# Patient Record
Sex: Female | Born: 1945 | Race: White | Hispanic: No | Marital: Married | Smoking: Former smoker
Health system: Southern US, Community
[De-identification: ages and names within clinical notes are randomized; demographics above are authoritative.]

## PROBLEM LIST (undated history)

## (undated) DIAGNOSIS — G4733 Obstructive sleep apnea (adult) (pediatric): Secondary | ICD-10-CM

## (undated) DIAGNOSIS — M545 Low back pain, unspecified: Secondary | ICD-10-CM

## (undated) DIAGNOSIS — N289 Disorder of kidney and ureter, unspecified: Secondary | ICD-10-CM

## (undated) DIAGNOSIS — I10 Essential (primary) hypertension: Secondary | ICD-10-CM

## (undated) DIAGNOSIS — M797 Fibromyalgia: Secondary | ICD-10-CM

## (undated) DIAGNOSIS — F419 Anxiety disorder, unspecified: Secondary | ICD-10-CM

## (undated) DIAGNOSIS — R0602 Shortness of breath: Secondary | ICD-10-CM

## (undated) DIAGNOSIS — D126 Benign neoplasm of colon, unspecified: Secondary | ICD-10-CM

## (undated) DIAGNOSIS — E785 Hyperlipidemia, unspecified: Secondary | ICD-10-CM

## (undated) DIAGNOSIS — L509 Urticaria, unspecified: Secondary | ICD-10-CM

## (undated) DIAGNOSIS — F32A Depression, unspecified: Secondary | ICD-10-CM

## (undated) DIAGNOSIS — M1990 Unspecified osteoarthritis, unspecified site: Secondary | ICD-10-CM

## (undated) DIAGNOSIS — H269 Unspecified cataract: Secondary | ICD-10-CM

## (undated) DIAGNOSIS — F329 Major depressive disorder, single episode, unspecified: Secondary | ICD-10-CM

## (undated) DIAGNOSIS — Z87898 Personal history of other specified conditions: Secondary | ICD-10-CM

## (undated) DIAGNOSIS — K648 Other hemorrhoids: Secondary | ICD-10-CM

## (undated) DIAGNOSIS — E119 Type 2 diabetes mellitus without complications: Secondary | ICD-10-CM

## (undated) HISTORY — DX: Obstructive sleep apnea (adult) (pediatric): G47.33

## (undated) HISTORY — DX: Unspecified cataract: H26.9

## (undated) HISTORY — DX: Shortness of breath: R06.02

## (undated) HISTORY — DX: Urticaria, unspecified: L50.9

## (undated) HISTORY — DX: Essential (primary) hypertension: I10

## (undated) HISTORY — DX: Low back pain: M54.5

## (undated) HISTORY — DX: Unspecified osteoarthritis, unspecified site: M19.90

## (undated) HISTORY — PX: TOTAL ABDOMINAL HYSTERECTOMY: SHX209

## (undated) HISTORY — DX: Personal history of other specified conditions: Z87.898

## (undated) HISTORY — DX: Type 2 diabetes mellitus without complications: E11.9

## (undated) HISTORY — PX: LASIK: SHX215

## (undated) HISTORY — DX: Major depressive disorder, single episode, unspecified: F32.9

## (undated) HISTORY — DX: Other hemorrhoids: K64.8

## (undated) HISTORY — DX: Benign neoplasm of colon, unspecified: D12.6

## (undated) HISTORY — DX: Depression, unspecified: F32.A

## (undated) HISTORY — DX: Disorder of kidney and ureter, unspecified: N28.9

## (undated) HISTORY — DX: Hyperlipidemia, unspecified: E78.5

## (undated) HISTORY — DX: Anxiety disorder, unspecified: F41.9

## (undated) HISTORY — PX: EYE SURGERY: SHX253

## (undated) HISTORY — DX: Low back pain, unspecified: M54.50

## (undated) HISTORY — PX: OTHER SURGICAL HISTORY: SHX169

---

## 1997-07-06 ENCOUNTER — Other Ambulatory Visit: Admission: RE | Admit: 1997-07-06 | Discharge: 1997-07-06 | Payer: Self-pay | Admitting: Obstetrics and Gynecology

## 1998-07-30 ENCOUNTER — Other Ambulatory Visit: Admission: RE | Admit: 1998-07-30 | Discharge: 1998-07-30 | Payer: Self-pay | Admitting: Obstetrics and Gynecology

## 1998-07-31 ENCOUNTER — Encounter (INDEPENDENT_AMBULATORY_CARE_PROVIDER_SITE_OTHER): Payer: Self-pay

## 1998-07-31 ENCOUNTER — Other Ambulatory Visit: Admission: RE | Admit: 1998-07-31 | Discharge: 1998-07-31 | Payer: Self-pay | Admitting: Obstetrics and Gynecology

## 1998-09-19 ENCOUNTER — Inpatient Hospital Stay (HOSPITAL_COMMUNITY): Admission: RE | Admit: 1998-09-19 | Discharge: 1998-09-26 | Payer: Self-pay | Admitting: Obstetrics and Gynecology

## 1998-09-19 ENCOUNTER — Encounter (INDEPENDENT_AMBULATORY_CARE_PROVIDER_SITE_OTHER): Payer: Self-pay | Admitting: Specialist

## 1998-09-22 ENCOUNTER — Encounter: Payer: Self-pay | Admitting: Obstetrics and Gynecology

## 1998-09-23 ENCOUNTER — Encounter: Payer: Self-pay | Admitting: Obstetrics and Gynecology

## 1998-09-24 ENCOUNTER — Encounter: Payer: Self-pay | Admitting: Obstetrics and Gynecology

## 1999-07-30 ENCOUNTER — Other Ambulatory Visit: Admission: RE | Admit: 1999-07-30 | Discharge: 1999-07-30 | Payer: Self-pay | Admitting: Obstetrics and Gynecology

## 1999-08-03 ENCOUNTER — Encounter: Payer: Self-pay | Admitting: *Deleted

## 1999-08-03 ENCOUNTER — Emergency Department (HOSPITAL_COMMUNITY): Admission: EM | Admit: 1999-08-03 | Discharge: 1999-08-03 | Payer: Self-pay | Admitting: *Deleted

## 2000-08-16 ENCOUNTER — Ambulatory Visit (HOSPITAL_COMMUNITY): Admission: RE | Admit: 2000-08-16 | Discharge: 2000-08-16 | Payer: Self-pay | Admitting: Obstetrics and Gynecology

## 2000-08-16 ENCOUNTER — Other Ambulatory Visit: Admission: RE | Admit: 2000-08-16 | Discharge: 2000-08-16 | Payer: Self-pay | Admitting: Obstetrics and Gynecology

## 2001-03-22 ENCOUNTER — Ambulatory Visit (HOSPITAL_BASED_OUTPATIENT_CLINIC_OR_DEPARTMENT_OTHER): Admission: RE | Admit: 2001-03-22 | Discharge: 2001-03-22 | Payer: Self-pay | Admitting: Pulmonary Disease

## 2001-06-02 ENCOUNTER — Ambulatory Visit (HOSPITAL_BASED_OUTPATIENT_CLINIC_OR_DEPARTMENT_OTHER): Admission: RE | Admit: 2001-06-02 | Discharge: 2001-06-02 | Payer: Self-pay | Admitting: Critical Care Medicine

## 2003-03-21 ENCOUNTER — Other Ambulatory Visit: Admission: RE | Admit: 2003-03-21 | Discharge: 2003-03-21 | Payer: Self-pay | Admitting: Obstetrics and Gynecology

## 2003-11-26 ENCOUNTER — Ambulatory Visit: Payer: Self-pay | Admitting: Internal Medicine

## 2003-12-28 ENCOUNTER — Ambulatory Visit: Payer: Self-pay | Admitting: Internal Medicine

## 2004-01-08 ENCOUNTER — Ambulatory Visit (HOSPITAL_COMMUNITY): Admission: RE | Admit: 2004-01-08 | Discharge: 2004-01-08 | Payer: Self-pay | Admitting: Internal Medicine

## 2004-03-12 ENCOUNTER — Ambulatory Visit: Payer: Self-pay | Admitting: Internal Medicine

## 2004-03-19 ENCOUNTER — Ambulatory Visit: Payer: Self-pay | Admitting: Internal Medicine

## 2004-04-04 ENCOUNTER — Ambulatory Visit: Payer: Self-pay | Admitting: Internal Medicine

## 2004-05-01 ENCOUNTER — Ambulatory Visit: Payer: Self-pay | Admitting: Critical Care Medicine

## 2004-07-09 ENCOUNTER — Ambulatory Visit: Payer: Self-pay | Admitting: Internal Medicine

## 2004-08-12 ENCOUNTER — Ambulatory Visit: Payer: Self-pay | Admitting: Internal Medicine

## 2004-08-29 ENCOUNTER — Ambulatory Visit (HOSPITAL_COMMUNITY): Admission: RE | Admit: 2004-08-29 | Discharge: 2004-08-29 | Payer: Self-pay | Admitting: Internal Medicine

## 2005-03-25 ENCOUNTER — Ambulatory Visit: Payer: Self-pay | Admitting: Internal Medicine

## 2005-07-20 ENCOUNTER — Ambulatory Visit: Payer: Self-pay | Admitting: Internal Medicine

## 2005-07-27 ENCOUNTER — Ambulatory Visit: Payer: Self-pay | Admitting: Internal Medicine

## 2005-08-05 ENCOUNTER — Ambulatory Visit: Payer: Self-pay | Admitting: Internal Medicine

## 2005-10-19 ENCOUNTER — Ambulatory Visit: Payer: Self-pay | Admitting: Internal Medicine

## 2005-10-27 ENCOUNTER — Ambulatory Visit: Payer: Self-pay | Admitting: Internal Medicine

## 2006-03-22 ENCOUNTER — Ambulatory Visit: Payer: Self-pay | Admitting: Internal Medicine

## 2006-04-29 ENCOUNTER — Ambulatory Visit: Payer: Self-pay | Admitting: Internal Medicine

## 2006-04-29 LAB — CONVERTED CEMR LAB
CO2: 26 meq/L (ref 19–32)
Calcium: 9 mg/dL (ref 8.4–10.5)
Chloride: 107 meq/L (ref 96–112)
GFR calc non Af Amer: 60 mL/min
Glucose, Bld: 124 mg/dL — ABNORMAL HIGH (ref 70–99)
Sodium: 142 meq/L (ref 135–145)

## 2006-05-04 ENCOUNTER — Ambulatory Visit: Payer: Self-pay | Admitting: Internal Medicine

## 2006-08-05 ENCOUNTER — Ambulatory Visit: Payer: Self-pay | Admitting: Internal Medicine

## 2006-08-05 LAB — CONVERTED CEMR LAB
Albumin: 3.7 g/dL (ref 3.5–5.2)
Alkaline Phosphatase: 54 units/L (ref 39–117)
BUN: 19 mg/dL (ref 6–23)
Creatinine, Ser: 1.1 mg/dL (ref 0.4–1.2)
GFR calc Af Amer: 65 mL/min
GFR calc non Af Amer: 54 mL/min
HDL: 30.8 mg/dL — ABNORMAL LOW (ref >39.0)
Hgb A1c MFr Bld: 6.2 % — ABNORMAL HIGH (ref 4.6–6.0)
LDL Cholesterol: 90 mg/dL (ref 0–99)
Potassium: 3.8 meq/L (ref 3.5–5.1)
Sed Rate: 13 mm/hr (ref 0–25)
Sodium: 140 meq/L (ref 135–145)
TSH: 1.27 microintl units/mL (ref 0.35–5.50)
Total Bilirubin: 0.7 mg/dL (ref 0.3–1.2)
Total CHOL/HDL Ratio: 4.9
Triglycerides: 155 mg/dL — ABNORMAL HIGH (ref 0–149)
VLDL: 31 mg/dL (ref 0–40)
Vit D, 1,25-Dihydroxy: 29 (ref 20–57)

## 2006-08-07 ENCOUNTER — Encounter: Payer: Self-pay | Admitting: Internal Medicine

## 2006-08-07 DIAGNOSIS — E669 Obesity, unspecified: Secondary | ICD-10-CM | POA: Insufficient documentation

## 2006-08-07 DIAGNOSIS — F32A Depression, unspecified: Secondary | ICD-10-CM | POA: Insufficient documentation

## 2006-08-07 DIAGNOSIS — I10 Essential (primary) hypertension: Secondary | ICD-10-CM | POA: Insufficient documentation

## 2006-08-07 DIAGNOSIS — G4733 Obstructive sleep apnea (adult) (pediatric): Secondary | ICD-10-CM | POA: Insufficient documentation

## 2006-08-07 DIAGNOSIS — M1990 Unspecified osteoarthritis, unspecified site: Secondary | ICD-10-CM | POA: Insufficient documentation

## 2006-08-07 DIAGNOSIS — F329 Major depressive disorder, single episode, unspecified: Secondary | ICD-10-CM | POA: Insufficient documentation

## 2006-08-07 DIAGNOSIS — F419 Anxiety disorder, unspecified: Secondary | ICD-10-CM | POA: Insufficient documentation

## 2006-08-12 ENCOUNTER — Ambulatory Visit: Payer: Self-pay | Admitting: Internal Medicine

## 2006-11-15 ENCOUNTER — Encounter: Payer: Self-pay | Admitting: Internal Medicine

## 2006-11-17 ENCOUNTER — Telehealth: Payer: Self-pay | Admitting: Internal Medicine

## 2006-12-22 ENCOUNTER — Encounter: Payer: Self-pay | Admitting: Internal Medicine

## 2006-12-24 ENCOUNTER — Ambulatory Visit: Payer: Self-pay | Admitting: Internal Medicine

## 2006-12-26 LAB — CONVERTED CEMR LAB
ALT: 38 units/L — ABNORMAL HIGH (ref 0–35)
AST: 25 units/L (ref 0–37)
Albumin: 3.5 g/dL (ref 3.5–5.2)
Bilirubin, Direct: 0.1 mg/dL (ref 0.0–0.3)
Calcium: 9.3 mg/dL (ref 8.4–10.5)
Chloride: 102 meq/L (ref 96–112)
Cholesterol: 150 mg/dL (ref 0–200)
Creatinine, Ser: 1.1 mg/dL (ref 0.4–1.2)
GFR calc non Af Amer: 54 mL/min
Glucose, Bld: 115 mg/dL — ABNORMAL HIGH (ref 70–99)
HDL: 40.4 mg/dL (ref >39.0)
LDL Cholesterol: 88 mg/dL (ref 0–99)
Sodium: 140 meq/L (ref 135–145)
Total Bilirubin: 0.8 mg/dL (ref 0.3–1.2)

## 2006-12-27 ENCOUNTER — Ambulatory Visit: Payer: Self-pay | Admitting: Internal Medicine

## 2006-12-27 DIAGNOSIS — M545 Low back pain, unspecified: Secondary | ICD-10-CM | POA: Insufficient documentation

## 2007-02-16 ENCOUNTER — Ambulatory Visit: Payer: Self-pay | Admitting: Internal Medicine

## 2007-02-16 DIAGNOSIS — M810 Age-related osteoporosis without current pathological fracture: Secondary | ICD-10-CM | POA: Insufficient documentation

## 2007-03-10 ENCOUNTER — Telehealth: Payer: Self-pay | Admitting: Internal Medicine

## 2007-04-12 ENCOUNTER — Ambulatory Visit: Payer: Self-pay | Admitting: Internal Medicine

## 2007-04-13 LAB — CONVERTED CEMR LAB
BUN: 21 mg/dL
CO2: 30 meq/L
Calcium: 9 mg/dL
Chloride: 104 meq/L
Cholesterol: 139 mg/dL
Creatinine, Ser: 1 mg/dL
GFR calc Af Amer: 72 mL/min
GFR calc non Af Amer: 60 mL/min
Glucose, Bld: 110 mg/dL — ABNORMAL HIGH
HDL: 35.3 mg/dL — ABNORMAL LOW
Hgb A1c MFr Bld: 6 %
LDL Cholesterol: 85 mg/dL
Potassium: 3.6 meq/L
Sodium: 139 meq/L
Total CHOL/HDL Ratio: 3.9
Triglycerides: 96 mg/dL
VLDL: 19 mg/dL

## 2007-04-19 ENCOUNTER — Ambulatory Visit: Payer: Self-pay | Admitting: Internal Medicine

## 2007-07-21 ENCOUNTER — Ambulatory Visit: Payer: Self-pay | Admitting: Internal Medicine

## 2007-07-21 DIAGNOSIS — M5412 Radiculopathy, cervical region: Secondary | ICD-10-CM | POA: Insufficient documentation

## 2007-07-21 DIAGNOSIS — R51 Headache: Secondary | ICD-10-CM

## 2007-07-21 DIAGNOSIS — R519 Headache, unspecified: Secondary | ICD-10-CM | POA: Insufficient documentation

## 2007-07-27 ENCOUNTER — Telehealth: Payer: Self-pay | Admitting: Internal Medicine

## 2007-07-29 ENCOUNTER — Ambulatory Visit: Payer: Self-pay | Admitting: Internal Medicine

## 2007-09-30 ENCOUNTER — Ambulatory Visit: Payer: Self-pay | Admitting: Internal Medicine

## 2007-10-03 LAB — CONVERTED CEMR LAB
ALT: 44 units/L — ABNORMAL HIGH (ref 0–35)
Basophils Absolute: 0.1 10*3/uL (ref 0.0–0.1)
Basophils Relative: 1.5 % (ref 0.0–3.0)
CO2: 30 meq/L (ref 19–32)
Calcium: 9.1 mg/dL (ref 8.4–10.5)
Cholesterol: 227 mg/dL (ref 0–200)
Creatinine, Ser: 1 mg/dL (ref 0.4–1.2)
Direct LDL: 147.1 mg/dL
Eosinophils Absolute: 0.2 10*3/uL (ref 0.0–0.7)
HCT: 39.4 % (ref 36.0–46.0)
Hemoglobin: 13.6 g/dL (ref 12.0–15.0)
Hgb A1c MFr Bld: 6 % (ref 4.6–6.0)
Ketones, ur: NEGATIVE mg/dL
Lymphocytes Relative: 35.6 % (ref 12.0–46.0)
MCHC: 34.5 g/dL (ref 30.0–36.0)
MCV: 90.6 fL (ref 78.0–100.0)
Monocytes Absolute: 0.6 10*3/uL (ref 0.1–1.0)
Neutro Abs: 2.6 10*3/uL (ref 1.4–7.7)
RBC: 4.35 M/uL (ref 3.87–5.11)
Specific Gravity, Urine: 1.015 (ref 1.000–1.03)
Total Bilirubin: 0.9 mg/dL (ref 0.3–1.2)
Total Protein, Urine: NEGATIVE mg/dL
Total Protein: 7.1 g/dL (ref 6.0–8.3)
Triglycerides: 150 mg/dL — ABNORMAL HIGH (ref 0–149)
Urine Glucose: NEGATIVE mg/dL
pH: 6.5 (ref 5.0–8.0)

## 2007-10-07 ENCOUNTER — Ambulatory Visit: Payer: Self-pay | Admitting: Internal Medicine

## 2007-12-16 ENCOUNTER — Telehealth: Payer: Self-pay | Admitting: Internal Medicine

## 2008-03-12 ENCOUNTER — Ambulatory Visit: Payer: Self-pay | Admitting: Internal Medicine

## 2008-03-13 LAB — CONVERTED CEMR LAB
CO2: 27 meq/L (ref 19–32)
Calcium: 9.3 mg/dL (ref 8.4–10.5)
Chloride: 106 meq/L (ref 96–112)
Direct LDL: 161 mg/dL
GFR calc non Af Amer: 53 mL/min
Hgb A1c MFr Bld: 6.2 % — ABNORMAL HIGH (ref 4.6–6.0)
Mucus, UA: NEGATIVE
Nitrite: NEGATIVE
Sodium: 141 meq/L (ref 135–145)
Specific Gravity, Urine: >=1.03 (ref 1.000–1.035)
pH: 5.5 (ref 5.0–8.0)

## 2008-03-19 ENCOUNTER — Ambulatory Visit: Payer: Self-pay | Admitting: Internal Medicine

## 2008-04-30 ENCOUNTER — Encounter: Payer: Self-pay | Admitting: Internal Medicine

## 2008-06-25 ENCOUNTER — Telehealth (INDEPENDENT_AMBULATORY_CARE_PROVIDER_SITE_OTHER): Payer: Self-pay | Admitting: *Deleted

## 2008-07-09 ENCOUNTER — Ambulatory Visit: Payer: Self-pay | Admitting: Internal Medicine

## 2008-07-09 LAB — CONVERTED CEMR LAB
ALT: 48 units/L — ABNORMAL HIGH (ref 0–35)
Bilirubin, Direct: 0.1 mg/dL (ref 0.0–0.3)
Chloride: 107 meq/L (ref 96–112)
Creatinine, Ser: 1.1 mg/dL (ref 0.4–1.2)
GFR calc non Af Amer: 53.31 mL/min (ref >60)
Hgb A1c MFr Bld: 6 % (ref 4.6–6.5)
Potassium: 3.9 meq/L (ref 3.5–5.1)
Total Bilirubin: 0.8 mg/dL (ref 0.3–1.2)
Total CHOL/HDL Ratio: 5
Triglycerides: 106 mg/dL (ref 0.0–149.0)
VLDL: 21.2 mg/dL (ref 0.0–40.0)

## 2008-07-17 ENCOUNTER — Ambulatory Visit: Payer: Self-pay | Admitting: Internal Medicine

## 2008-07-17 DIAGNOSIS — M25559 Pain in unspecified hip: Secondary | ICD-10-CM | POA: Insufficient documentation

## 2008-08-23 ENCOUNTER — Telehealth (INDEPENDENT_AMBULATORY_CARE_PROVIDER_SITE_OTHER): Payer: Self-pay | Admitting: *Deleted

## 2008-08-28 ENCOUNTER — Encounter: Payer: Self-pay | Admitting: Internal Medicine

## 2008-09-07 ENCOUNTER — Telehealth: Payer: Self-pay | Admitting: Internal Medicine

## 2008-09-12 HISTORY — PX: TOTAL HIP ARTHROPLASTY: SHX124

## 2008-09-19 ENCOUNTER — Inpatient Hospital Stay (HOSPITAL_COMMUNITY): Admission: RE | Admit: 2008-09-19 | Discharge: 2008-09-22 | Payer: Self-pay | Admitting: Orthopedic Surgery

## 2008-09-26 ENCOUNTER — Encounter: Payer: Self-pay | Admitting: Internal Medicine

## 2008-09-27 ENCOUNTER — Telehealth: Payer: Self-pay | Admitting: Internal Medicine

## 2008-09-27 ENCOUNTER — Ambulatory Visit (HOSPITAL_COMMUNITY): Admission: RE | Admit: 2008-09-27 | Discharge: 2008-09-27 | Payer: Self-pay | Admitting: Internal Medicine

## 2008-09-27 ENCOUNTER — Ambulatory Visit: Payer: Self-pay | Admitting: Internal Medicine

## 2008-09-27 DIAGNOSIS — R339 Retention of urine, unspecified: Secondary | ICD-10-CM | POA: Insufficient documentation

## 2008-10-01 ENCOUNTER — Ambulatory Visit: Payer: Self-pay | Admitting: Internal Medicine

## 2008-10-03 ENCOUNTER — Encounter: Payer: Self-pay | Admitting: Internal Medicine

## 2008-10-04 ENCOUNTER — Ambulatory Visit: Payer: Self-pay | Admitting: Internal Medicine

## 2008-10-04 ENCOUNTER — Telehealth: Payer: Self-pay | Admitting: Internal Medicine

## 2008-10-04 DIAGNOSIS — J309 Allergic rhinitis, unspecified: Secondary | ICD-10-CM | POA: Insufficient documentation

## 2008-10-04 DIAGNOSIS — L509 Urticaria, unspecified: Secondary | ICD-10-CM | POA: Insufficient documentation

## 2008-10-08 ENCOUNTER — Ambulatory Visit: Payer: Self-pay | Admitting: Internal Medicine

## 2008-10-08 LAB — CONVERTED CEMR LAB
ALT: 24 units/L (ref 0–35)
AST: 19 units/L (ref 0–37)
Alkaline Phosphatase: 77 units/L (ref 39–117)
BUN: 18 mg/dL (ref 6–23)
Bilirubin, Direct: 0 mg/dL (ref 0.0–0.3)
Calcium: 9.1 mg/dL (ref 8.4–10.5)
Cholesterol: 195 mg/dL (ref 0–200)
Creatinine, Ser: 1 mg/dL (ref 0.4–1.2)
GFR calc non Af Amer: 59.47 mL/min (ref >60)
Potassium: 3.8 meq/L (ref 3.5–5.1)
Total Bilirubin: 0.7 mg/dL (ref 0.3–1.2)
Total Protein: 7.2 g/dL (ref 6.0–8.3)

## 2008-10-15 ENCOUNTER — Telehealth: Payer: Self-pay | Admitting: Internal Medicine

## 2008-10-16 ENCOUNTER — Ambulatory Visit: Payer: Self-pay | Admitting: Internal Medicine

## 2008-10-18 ENCOUNTER — Telehealth: Payer: Self-pay | Admitting: Internal Medicine

## 2009-02-26 ENCOUNTER — Telehealth: Payer: Self-pay | Admitting: Internal Medicine

## 2009-03-28 ENCOUNTER — Ambulatory Visit: Payer: Self-pay | Admitting: Internal Medicine

## 2009-03-28 LAB — CONVERTED CEMR LAB
ALT: 32 units/L (ref 0–35)
Alkaline Phosphatase: 62 units/L (ref 39–117)
BUN: 14 mg/dL (ref 6–23)
Bilirubin Urine: NEGATIVE
Bilirubin, Direct: 0.1 mg/dL (ref 0.0–0.3)
CO2: 30 meq/L (ref 19–32)
Calcium: 9.1 mg/dL (ref 8.4–10.5)
Direct LDL: 158.4 mg/dL
GFR calc non Af Amer: 67.05 mL/min (ref >60)
Glucose, Bld: 90 mg/dL (ref 70–99)
Nitrite: NEGATIVE
Total Bilirubin: 0.6 mg/dL (ref 0.3–1.2)
Total Protein, Urine: NEGATIVE mg/dL
Total Protein: 7.2 g/dL (ref 6.0–8.3)
Urine Glucose: NEGATIVE mg/dL
pH: 6.5 (ref 5.0–8.0)

## 2009-04-04 ENCOUNTER — Ambulatory Visit: Payer: Self-pay | Admitting: Internal Medicine

## 2009-04-04 DIAGNOSIS — Z87891 Personal history of nicotine dependence: Secondary | ICD-10-CM | POA: Insufficient documentation

## 2009-05-07 ENCOUNTER — Encounter: Payer: Self-pay | Admitting: Internal Medicine

## 2009-05-27 ENCOUNTER — Encounter: Payer: Self-pay | Admitting: Internal Medicine

## 2009-08-06 ENCOUNTER — Telehealth: Payer: Self-pay | Admitting: Internal Medicine

## 2009-09-02 ENCOUNTER — Telehealth (INDEPENDENT_AMBULATORY_CARE_PROVIDER_SITE_OTHER): Payer: Self-pay | Admitting: *Deleted

## 2009-09-03 ENCOUNTER — Ambulatory Visit: Payer: Self-pay | Admitting: Internal Medicine

## 2009-09-03 LAB — CONVERTED CEMR LAB
BUN: 14 mg/dL (ref 6–23)
Chloride: 105 meq/L (ref 96–112)
Creatinine, Ser: 0.8 mg/dL (ref 0.4–1.2)
GFR calc non Af Amer: 75.62 mL/min (ref >60)
Hgb A1c MFr Bld: 6.1 % (ref 4.6–6.5)

## 2009-09-06 ENCOUNTER — Ambulatory Visit: Payer: Self-pay | Admitting: Internal Medicine

## 2009-10-09 ENCOUNTER — Encounter: Payer: Self-pay | Admitting: Internal Medicine

## 2009-11-25 ENCOUNTER — Ambulatory Visit: Payer: Self-pay | Admitting: Internal Medicine

## 2009-11-25 DIAGNOSIS — K1120 Sialoadenitis, unspecified: Secondary | ICD-10-CM | POA: Insufficient documentation

## 2009-12-25 ENCOUNTER — Encounter: Payer: Self-pay | Admitting: Internal Medicine

## 2010-01-16 ENCOUNTER — Encounter: Payer: Self-pay | Admitting: Internal Medicine

## 2010-01-21 ENCOUNTER — Telehealth: Payer: Self-pay | Admitting: Internal Medicine

## 2010-01-24 ENCOUNTER — Encounter (INDEPENDENT_AMBULATORY_CARE_PROVIDER_SITE_OTHER): Payer: Self-pay | Admitting: *Deleted

## 2010-02-02 ENCOUNTER — Encounter: Payer: Self-pay | Admitting: Internal Medicine

## 2010-02-13 NOTE — Miscellaneous (Signed)
Summary: Flu  Clinical Lists Changes  Observations: Added new observation of FLU VAX: Historical (10/08/2009 11:44)      Immunization History:  Influenza Immunization History:    Influenza:  historical (10/08/2009)

## 2010-02-13 NOTE — Progress Notes (Signed)
Summary: LETTER?   Phone Note Call from Patient Call back at Home Phone [phone] Call back at [phone]   Summary of Call: Pt wants to know if Dr Posey Rea recieved letter she mailed to him?  Initial call taken by: Lamar Sprinkles, CMA,  January 21, 2010 12:55 PM  Follow-up for Phone Call        Patient wants to know when MD will write her a letter. Follow-up by: Daphane Shepherd,  January 21, 2010 4:34 PM  Additional Follow-up for Phone Call Additional follow up Details #1::        MD gave me info today to read over and generate letter.  Additional Follow-up by: Lamar Sprinkles, CMA,  January 21, 2010 5:51 PM    Additional Follow-up for Phone Call Additional follow up Details #2::    Letter pending signature. Pt aware we are working on letter.  Follow-up by: Lamar Sprinkles, CMA,  January 24, 2010 1:41 PM  Additional Follow-up for Phone Call Additional follow up Details #3:: Details for Additional Follow-up Action Taken: Completed letter, mailed to pt, Pt informed  Additional Follow-up by: Lamar Sprinkles, CMA,  January 27, 2010 6:15 PM

## 2010-02-13 NOTE — Assessment & Plan Note (Signed)
Summary: right sore throat pain/offered sda w/another md/refused/cd   Vital Signs:  Patient profile:   65 year old female Height:      63 inches Weight:      223 pounds BMI:     39.65 Temp:     98.4 degrees F oral Pulse rate:   80 / minute Pulse rhythm:   regular Resp:     16 per minute BP sitting:   110 / 82  (left arm) Cuff size:   large  Vitals Entered By: Lanier Prude, Beverly Gust) (November 25, 2009 3:31 PM) CC: trouble swallowing "feels like something is in throat"  X  4wks Is Patient Diabetic? Yes   Primary Care Azaiah Mello:  Georgina Quint [name] MD  CC:  trouble swallowing "feels like something is in throat"  X  4wks.  History of Present Illness: C/o ST on R  x 1 wk  Current Medications (verified): 1)  Lasix 40 Mg  Tabs (Furosemide) .[name].. 1 Every Morning 2)  Enalapril Maleate 10 Mg  Tabs (Enalapril Maleate) .... Take 1 Tablet By Mouth Once A Day 3)  Citalopram Hydrobromide 40 Mg  Tabs (Citalopram Hydrobromide) .... Once Daily 4)  Vitamin D3 1000 Unit  Tabs (Cholecalciferol) .[name].. 1 Qd 5)  Metformin Hcl 500 Mg Tabs (Metformin Hcl) .... Two Times A Day 6)  Fish Oil   Oil (Fish Oil) .... Two Times A Day 7)  Flexeril 10 Mg Tabs (Cyclobenzaprine Hcl) .[name].. 1 By Mouth Three Times A Day Prn 8)  Tramadol Hcl 50 Mg  Tabs (Tramadol Hcl) .[name].. 1-2 By Mouth Two Times A Day As Needed Pain 9)  Hydrocodone-Acetaminophen 10-325 Mg Tabs (Hydrocodone-Acetaminophen) .... 1/2 or 1 By Mouth Up To 4 Time Per Day As Needed For Pain 10)  Flonase 50 Mcg/act Susp (Fluticasone Propionate) .[name].. 1 Spray Each Nostril Each Am 11)  Cpap Mask .... As Dirr 12)  Lotrisone 1-0.05 % Crea (Clotrimazole-Betamethasone) .... Use Bid  Allergies (verified): 1)  ! Celebrex 2)  ! Naprosyn 3)  ! * Robaxin 4)  Lovastatin (Lovastatin)  Past History:  Past Medical History: Last updated: 10/16/2008 Anxiety Depression Hypertension Osteoarthritis Diabetes mellitus, type II FMS Low back  pain OSA Osteoporosis Hyperlipidemia GI Dr Andria Meuse [facility]  Review of Systems  The patient denies fever and abdominal pain.    Physical Exam  General:  alert, well-developed, and cooperative to examination, overweight-appearing.   Nose:  External nasal examination shows no deformity or inflammation. Nasal mucosa are pink and moist without lesions or exudates. Mouth:  moist Neck:  R salivary gland is tender and enlarged (portable US) Lungs:  normal respiratory effort, no intercostal retractions or use of accessory muscles; normal breath sounds bilaterally - no crackles and no wheezes.    Heart:  normal rate, regular rhythm, no murmur, and no rub. BLE without edema. Abdomen:  obese, suprapubic fullness to palpation - nontender, +BS   Impression & Recommendations:  Problem # 1:  SIALADENITIS (ICD-527.2) R Assessment New Rx: Ceftin bid  Complete Medication List: 1)  Lasix 40 Mg Tabs (Furosemide) .[name].. 1 every morning 2)  Enalapril Maleate 10 Mg Tabs (Enalapril maleate) .... Take 1 tablet by mouth once a day 3)  Citalopram Hydrobromide 40 Mg Tabs (Citalopram hydrobromide) .... Once daily 4)  Vitamin D3 1000 Unit Tabs (Cholecalciferol) .[name].. 1 qd 5)  Metformin Hcl 500 Mg Tabs (Metformin hcl) .... Two times a day 6)  Fish Oil Oil (Fish oil) .... Two times a day  7)  Flexeril 10 Mg Tabs (Cyclobenzaprine hcl) .[name].. 1 by mouth three times a day prn 8)  Tramadol Hcl 50 Mg Tabs (Tramadol hcl) .[name].. 1-2 by mouth two times a day as needed pain 9)  Hydrocodone-acetaminophen 10-325 Mg Tabs (Hydrocodone-acetaminophen) .... 1/2 or 1 by mouth up to 4 time per day as needed for pain 10)  Flonase 50 Mcg/act Susp (Fluticasone propionate) .[name].. 1 spray each nostril each am 11)  Cpap Mask  .... As dirr 12)  Lotrisone 1-0.05 % Crea (Clotrimazole-betamethasone) .... Use bid 13)  Ceftin 500 Mg Tabs (Cefuroxime axetil) .[name].. 1 by mouth bid  Patient Instructions: 1)  Call if you are not better in a  reasonable amount of time or if worse.  Prescriptions: CEFTIN 500 MG TABS (CEFUROXIME AXETIL) 1 by mouth bid  #20 x 1   Entered and Authorized by:   Tresa Garter MD   Signed by:   Tresa Garter MD on 11/25/2009   Method used:   Electronically to        [facility] [address] [address] (retail)       [address]       [place], [place]  [address]       Ph: [phone] or [phone]       Fax: [phone]   RxID:   [id number]    Orders Added: 1)  Est. Patient Level III [42595]

## 2010-02-13 NOTE — Progress Notes (Signed)
Summary: appt  Phone Note Call from Patient   Caller: Patient Summary of Call: Patient called [facility] requesting appt with MD. Phone note sent to scheduler for appt.Alvy Beal Archie CMA  September 02, 2009 10:20 AM   Follow-up for Phone Call        Appt sched for Aug 26th. Follow-up by: Verdell Face,  September 02, 2009 11:20 AM

## 2010-02-13 NOTE — Assessment & Plan Note (Signed)
Summary: f/u appt/cd   Vital Signs:  Patient profile:   65 year old female Height:      63 inches Weight:      220 pounds BMI:     39.11 O2 Sat:      97 % on Room air Temp:     98.3 degrees F oral Pulse rate:   89 / minute Pulse rhythm:   regular Resp:     16 per minute BP sitting:   102 / 70  (left arm) Cuff size:   large  Vitals Entered By: Lanier Prude, CMA(AAMA) (September 06, 2009 3:26 PM)  O2 Flow:  Room air CC: f/u c/o ears itching Is Patient Diabetic? Yes   Primary Care Jamaul Heist:  Tresa Garter MD  CC:  f/u c/o ears itching.  History of Present Illness: The patient presents for a follow up of hypertension, diabetes, hyperlipidemia C/o stress - husband has a schwannoma of the ? sacral spine   Current Medications (verified): 1)  Lasix 40 Mg  Tabs (Furosemide) .[name].. 1 Every Morning 2)  Enalapril Maleate 10 Mg  Tabs (Enalapril Maleate) .... Take 1 Tablet By Mouth Once A Day 3)  Citalopram Hydrobromide 40 Mg  Tabs (Citalopram Hydrobromide) .... Once Daily 4)  Vitamin D3 1000 Unit  Tabs (Cholecalciferol) .[name].. 1 Qd 5)  Metformin Hcl 500 Mg Tabs (Metformin Hcl) .... Two Times A Day 6)  Fish Oil   Oil (Fish Oil) .... Two Times A Day 7)  Flexeril 10 Mg Tabs (Cyclobenzaprine Hcl) .[name].. 1 By Mouth Three Times A Day Prn 8)  Tramadol Hcl 50 Mg  Tabs (Tramadol Hcl) .[name].. 1-2 By Mouth Two Times A Day As Needed Pain 9)  Hydrocodone-Acetaminophen 10-325 Mg Tabs (Hydrocodone-Acetaminophen) .... 1/2 or 1 By Mouth Up To 4 Time Per Day As Needed For Pain 10)  Flonase 50 Mcg/act Susp (Fluticasone Propionate) .[name].. 1 Spray Each Nostril Each Am 11)  Cpap Mask .... As Dirr  Allergies (verified): 1)  ! Celebrex 2)  ! Naprosyn 3)  ! * Robaxin 4)  Lovastatin (Lovastatin)  Past History:  Past Medical History: Last updated: 10/16/2008 Anxiety Depression Hypertension Osteoarthritis Diabetes mellitus, type II FMS Low back pain OSA Osteoporosis Hyperlipidemia GI Dr  Andria Meuse [facility]  Past Surgical History: Last updated: 09/27/2008 Hysterectomy s/p cervical cryotherapy right THR 09/2008 - [facility]  Social History: Last updated: 10/07/2007 Divorced, has a boyfriend Sam, got married 2009 1 son Former Smoker Alcohol use-yes  Review of Systems  The patient denies fever, weight loss, weight gain, dyspnea on exertion, abdominal pain, and melena.    Physical Exam  General:  alert, well-developed, and cooperative to examination, overweight-appearing.   Head:  Normocephalic and atraumatic without obvious abnormalities. No apparent alopecia or balding. Ears:  B ear canals are scaly Mouth:  moist Neck:  No deformities, masses, or tenderness noted. Lungs:  normal respiratory effort, no intercostal retractions or use of accessory muscles; normal breath sounds bilaterally - no crackles and no wheezes.    Heart:  normal rate, regular rhythm, no murmur, and no rub. BLE without edema. Abdomen:  obese, suprapubic fullness to palpation - nontender, +BS Genitalia:  Foley is in Msk:  W/c R hip tender Using a cane Neurologic:  alert & oriented X3 and cranial nerves II-XII symetrically intact.  strength normal in all extremities, sensation intact to light touch, and gait normal. speech fluent without dysarthria or aphasia; follows commands with good comprehension.  Skin:  Clear Psych:  Oriented X3 and normally interactive.     Impression & Recommendations:  Problem # 1:  DIABETES MELLITUS, TYPE II (ICD-250.00) Assessment Improved  Her updated medication list for this problem includes:    Enalapril Maleate 10 Mg Tabs (Enalapril maleate) .[name]... Take 1 tablet by mouth once a day    Metformin Hcl 500 Mg Tabs (Metformin hcl) .[name]..[name] Two times a day  Problem # 2:  HYPERLIPIDEMIA (ICD-272.4) Assessment: Comment Only On diet  Problem # 3:  HYPERTENSION (ICD-401.9) Assessment: Unchanged  Her updated medication list for this problem includes:    Lasix 40  Mg Tabs (Furosemide) .[name]... 1 every morning    Enalapril Maleate 10 Mg Tabs (Enalapril maleate) .[name]... Take 1 tablet by mouth once a day  BP today: 102/70 Prior BP: 100/70 (04/04/2009)  Labs Reviewed: K+: 4.2 (09/03/2009) Creat: : 0.8 (09/03/2009)   Chol: 226 (03/28/2009)   HDL: 48.60 (03/28/2009)   LDL: 135 (10/08/2008)   TG: 186.0 (03/28/2009)  Problem # 4:  ANXIETY (ICD-300.00)/stress Assessment: Deteriorated  Her updated medication list for this problem includes:    Citalopram Hydrobromide 40 Mg Tabs (Citalopram hydrobromide) ..... Once daily  Problem # 5:  OSTEOPOROSIS (ICD-733.00) Assessment: Unchanged Vit D to cont  Problem # 6:  OTITIS EXTERNA (ICD-380.10) B Assessment: New Lotrisone  Complete Medication List: 1)  Lasix 40 Mg Tabs (Furosemide) .[name].. 1 every morning 2)  Enalapril Maleate 10 Mg Tabs (Enalapril maleate) .... Take 1 tablet by mouth once a day 3)  Citalopram Hydrobromide 40 Mg Tabs (Citalopram hydrobromide) .... Once daily 4)  Vitamin D3 1000 Unit Tabs (Cholecalciferol) .[name].. 1 qd 5)  Metformin Hcl 500 Mg Tabs (Metformin hcl) .... Two times a day 6)  Fish Oil Oil (Fish oil) .... Two times a day 7)  Flexeril 10 Mg Tabs (Cyclobenzaprine hcl) .[name].. 1 by mouth three times a day prn 8)  Tramadol Hcl 50 Mg Tabs (Tramadol hcl) .[name].. 1-2 by mouth two times a day as needed pain 9)  Hydrocodone-acetaminophen 10-325 Mg Tabs (Hydrocodone-acetaminophen) .... 1/2 or 1 by mouth up to 4 time per day as needed for pain 10)  Flonase 50 Mcg/act Susp (Fluticasone propionate) .[name].. 1 spray each nostril each am 11)  Cpap Mask  .... As dirr 12)  Lotrisone 1-0.05 % Crea (Clotrimazole-betamethasone) .... Use bid  Patient Instructions: 1)  Please schedule a follow-up appointment in 4 months well w/labs and A1c 250.00.  Contraindications/Deferment of Procedures/Staging:    Test/Procedure: FLU VAX    Reason for deferment: declined-financial  Prescriptions: LOTRISONE 1-0.05 % CREA  (CLOTRIMAZOLE-BETAMETHASONE) use bid  #30g x 1   Entered and Authorized by:   Tresa Garter MD   Signed by:   Tresa Garter MD on 09/06/2009   Method used:   Print then Give to Patient   RxID:   [id number]

## 2010-02-13 NOTE — Letter (Signed)
Summary: [facility]   Imported By: Lester Maribel 06/17/2009 09:43:31  _____________________________________________________________________  External Attachment:    Type:   Image     Comment:   External Document

## 2010-02-13 NOTE — Progress Notes (Signed)
Summary: REFILL  Phone Note Refill Request Call back at Home Phone [phone]   Refills Requested: Medication #1:  TRAMADOL HCL 50 MG  TABS 1-2 by mouth two times a day as needed pain Initial call taken by: Lamar Sprinkles, CMA,  August 06, 2009 3:54 PM  Follow-up for Phone Call        ok x3 Follow-up by: Tresa Garter MD,  August 06, 2009 6:02 PM  Additional Follow-up for Phone Call Additional follow up Details #1::        Rx sent in, pt's # busy. She left vm on triage req a call when complete.  Additional Follow-up by: Lamar Sprinkles, CMA,  August 06, 2009 6:11 PM    Additional Follow-up for Phone Call Additional follow up Details #2::    pt informed  Follow-up by: Lanier Prude, [facility]),  August 07, 2009 2:28 PM  Prescriptions: TRAMADOL HCL 50 MG  TABS (TRAMADOL HCL) 1-2 by mouth two times a day as needed pain  #120 Each x 2   Entered by:   Lamar Sprinkles, CMA   Authorized by:   Tresa Garter MD   Signed by:   Lamar Sprinkles, CMA on 08/06/2009   Method used:   Electronically to        [facility] [address] (retail)       [address]       [place], [place]  [address]       Ph: [phone] or [phone]       Fax: [phone]   RxID:   [id number]

## 2010-02-13 NOTE — Progress Notes (Signed)
Summary: FLEXARIL  Phone Note Call from Patient   Summary of Call: Patient is requesting refill of cyclobenzaprine two times a day and would like #180 w/3 refills. No in EMR, please advise Initial call taken by: Lamar Sprinkles, CMA,  February 26, 2009 3:06 PM  Follow-up for Phone Call        OK Follow-up by: Tresa Garter MD,  February 26, 2009 5:44 PM  Additional Follow-up for Phone Call Additional follow up Details #1::        left mess to call office back  Additional Follow-up by: Lamar Sprinkles, CMA,  February 26, 2009 5:46 PM    Additional Follow-up for Phone Call Additional follow up Details #2::    Patient notified. Follow-up by: Lucious Groves,  February 27, 2009 8:32 AM  Prescriptions: FLEXERIL 10 MG TABS (CYCLOBENZAPRINE HCL) 1 by mouth three times a day prn  #180 x 3   Entered by:   Lamar Sprinkles, CMA   Authorized by:   Tresa Garter MD   Signed by:   Lamar Sprinkles, CMA on 02/26/2009   Method used:   Electronically to        [facility] [address] (retail)       [address]       [place], [place]  [address]       Ph: [phone] or [phone]       Fax: [phone]   RxID:   [id number]

## 2010-02-13 NOTE — Miscellaneous (Signed)
Summary: Healthcare POA  Healthcare POA   Imported By: Sherian Rein 01/31/2010 09:09:41  _____________________________________________________________________  External Attachment:    Type:   Image     Comment:   External Document

## 2010-02-13 NOTE — Assessment & Plan Note (Signed)
Summary: FU /  NWS  #   Vital Signs:  Patient profile:   65 year old female Weight:      221 pounds Temp:     97.5 degrees F oral Pulse rate:   89 / minute BP sitting:   100 / 70  (left arm)  Vitals Entered By: Tora Perches (April 04, 2009 8:51 AM) CC: f/u Is Patient Diabetic? No   Primary Care Provider:  Tresa Garter MD  CC:  f/u.  History of Present Illness: The patient presents for a follow up of hypertension, diabetes, hyperlipidemia   Preventive Screening-Counseling & Management  Alcohol-Tobacco     Smoking Status: quit  Current Medications (verified): 1)  Lasix 40 Mg  Tabs (Furosemide) .[name].. 1 Every Morning 2)  Enalapril Maleate 10 Mg  Tabs (Enalapril Maleate) .... Take 1 Tablet By Mouth Once A Day 3)  Citalopram Hydrobromide 40 Mg  Tabs (Citalopram Hydrobromide) .... Once Daily 4)  Vitamin D3 1000 Unit  Tabs (Cholecalciferol) .[name].. 1 Qd 5)  Metformin Hcl 500 Mg Tabs (Metformin Hcl) .... Bid 6)  Fish Oil   Oil (Fish Oil) .... Two Times A Day 7)  Flexeril 10 Mg Tabs (Cyclobenzaprine Hcl) .[name].. 1 By Mouth Three Times A Day Prn 8)  Tramadol Hcl 50 Mg  Tabs (Tramadol Hcl) .[name].. 1-2 By Mouth Two Times A Day As Needed Pain 9)  Hydrocodone-Acetaminophen 10-325 Mg Tabs (Hydrocodone-Acetaminophen) .... 1/2 or 1 By Mouth Up To 4 Time Per Day As Needed For Pain 10)  Flonase 50 Mcg/act Susp (Fluticasone Propionate) .[name].. 1 Spray Each Nostril Each Am 11)  Cpap Mask .... As Dirr  Allergies: 1)  ! Celebrex 2)  ! Naprosyn 3)  ! * Robaxin 4)  Lovastatin (Lovastatin)  Past History:  Past Medical History: Last updated: 10/16/2008 Anxiety Depression Hypertension Osteoarthritis Diabetes mellitus, type II FMS Low back pain OSA Osteoporosis Hyperlipidemia GI Dr Tiffany Gibbs [facility]  Past Surgical History: Last updated: 09/27/2008 Hysterectomy s/p cervical cryotherapy right THR 09/2008 - [facility]  Family History: Last updated: 2007/03/04 mother with HTN,  died at 2 with blood clot father with heart disease - died at 61 wtih MI, also with MS  Social History: Last updated: 10/07/2007 Divorced, has a boyfriend Sam, got married 2009 1 son Former Smoker Alcohol use-yes  Physical Exam  General:  alert, well-developed, and cooperative to examination, overweight-appearing.   Head:  Normocephalic and atraumatic without obvious abnormalities. No apparent alopecia or balding. Nose:  External nasal examination shows no deformity or inflammation. Nasal mucosa are pink and moist without lesions or exudates. Mouth:  moist Lungs:  normal respiratory effort, no intercostal retractions or use of accessory muscles; normal breath sounds bilaterally - no crackles and no wheezes.    Heart:  normal rate, regular rhythm, no murmur, and no rub. BLE without edema. Abdomen:  obese, suprapubic fullness to palpation - nontender, +BS Msk:  W/c R hip tender Using a cane Extremities:  trace left pedal edema and trace right pedal edema.   Skin:  Clear Psych:  Oriented X3 and normally interactive.     Impression & Recommendations:  Problem # 1:  DIABETES MELLITUS, TYPE II (ICD-250.00) Assessment Improved  Her updated medication list for this problem includes:    Enalapril Maleate 10 Mg Tabs (Enalapril maleate) .[name]... Take 1 tablet by mouth once a day    Metformin Hcl 500 Mg Tabs (Metformin hcl) ..... Bid  Labs Reviewed: Creat: 0.9 (03/28/2009)  Reviewed HgBA1c results: 5.7 (10/08/2008)  6.0 (07/09/2008)  Problem # 2:  HYPERTENSION (ICD-401.9) Assessment: Improved  Her updated medication list for this problem includes:    Lasix 40 Mg Tabs (Furosemide) .[name]... 1 every morning    Enalapril Maleate 10 Mg Tabs (Enalapril maleate) .[name]... Take 1 tablet by mouth once a day  BP today: 100/70 Prior BP: 130/100 (10/16/2008)  Labs Reviewed: K+: 4.0 (03/28/2009) Creat: : 0.9 (03/28/2009)   Chol: 226 (03/28/2009)   HDL: 48.60 (03/28/2009)   LDL: 135 (10/08/2008)    TG: 186.0 (03/28/2009)  Problem # 3:  HIP PAIN (ICD-719.45) Assessment: Improved  Her updated medication list for this problem includes:    Flexeril 10 Mg Tabs (Cyclobenzaprine hcl) .[name]... 1 by mouth three times a day prn    Tramadol Hcl 50 Mg Tabs (Tramadol hcl) .[name]... 1-2 by mouth two times a day as needed pain    Hydrocodone-acetaminophen 10-325 Mg Tabs (Hydrocodone-acetaminophen) .[name]... 1/2 or 1 by mouth up to 4 time per day as needed for pain  Problem # 4:  LOW BACK PAIN (ICD-724.2) Assessment: Improved  Her updated medication list for this problem includes:    Flexeril 10 Mg Tabs (Cyclobenzaprine hcl) .[name]... 1 by mouth three times a day prn    Tramadol Hcl 50 Mg Tabs (Tramadol hcl) .[name]... 1-2 by mouth two times a day as needed pain    Hydrocodone-acetaminophen 10-325 Mg Tabs (Hydrocodone-acetaminophen) .[name]... 1/2 or 1 by mouth up to 4 time per day as needed for pain  Problem # 5:  OBESITY (ICD-278.00) Assessment: Deteriorated See "Patient Instructions".   Problem # 6:  ANXIETY (ICD-300.00) Assessment: Unchanged  Her updated medication list for this problem includes:    Citalopram Hydrobromide 40 Mg Tabs (Citalopram hydrobromide) ..... Once daily  Complete Medication List: 1)  Lasix 40 Mg Tabs (Furosemide) .[name].. 1 every morning 2)  Enalapril Maleate 10 Mg Tabs (Enalapril maleate) .... Take 1 tablet by mouth once a day 3)  Citalopram Hydrobromide 40 Mg Tabs (Citalopram hydrobromide) .... Once daily 4)  Vitamin D3 1000 Unit Tabs (Cholecalciferol) .[name].. 1 qd 5)  Metformin Hcl 500 Mg Tabs (Metformin hcl) .... Bid 6)  Fish Oil Oil (Fish oil) .... Two times a day 7)  Flexeril 10 Mg Tabs (Cyclobenzaprine hcl) .[name].. 1 by mouth three times a day prn 8)  Tramadol Hcl 50 Mg Tabs (Tramadol hcl) .[name].. 1-2 by mouth two times a day as needed pain 9)  Hydrocodone-acetaminophen 10-325 Mg Tabs (Hydrocodone-acetaminophen) .... 1/2 or 1 by mouth up to 4 time per day as needed for pain 10)  Flonase 50  Mcg/act Susp (Fluticasone propionate) .[name].. 1 spray each nostril each am 11)  Cpap Mask  .... As dirr  Patient Instructions: 1)  Start taking a chair yoga class or Tai chi 2)  Please schedule a follow-up appointment in 4 months. 3)  BMP prior to visit, ICD-9: 4)  HbgA1C prior to visit, ICD-9:250.00 5)  It is important that you exercise regularly at least 20 minutes 5 times a week. If you develop chest pain, have severe difficulty breathing, or feel very tired , stop exercising immediately and seek medical attention. 6)  You need to lose weight. Consider a lower calorie diet and regular exercise.  Prescriptions: CITALOPRAM HYDROBROMIDE 40 MG  TABS (CITALOPRAM HYDROBROMIDE) once daily  #90 x 3   Entered and Authorized by:   Tresa Garter MD   Signed by:   Tresa Garter MD on 04/04/2009  Method used:   Print then Give to Patient   RxID:   [id number]

## 2010-02-13 NOTE — Letter (Signed)
Summary: Generic Letter  [facility]  [address] [place], [place] [address]   Phone: [phone]  Fax: [phone]    01/24/2010    To whom it may concern   Teresea Donley, DOB [birth year], is followed by [facility]. She has the following diagnoses related to this current issue: Hip pain, fibromyalgia, cervical radiculitis, osteoporosis, severe obstructive sleep apnea, low back pain, diabetes mellitus type 2, osteoarthritis, hypertension, depression and anxiety. Ms Viruet is treated with multiple medications. I am aware of the tremendous stress she is under assisting with the care of her husband. In the past, caring for her husband has caused increased symptoms and complications of her diseases. She is only able to provide minimal care for her husband, Tamilyn Lupien, due to her diagnoses and needed treatment. Please take this into consideration when deciding on care for her husband. Feel free to contact the office for with any questions   Sincerely,    A. [name], M.D.

## 2010-02-14 ENCOUNTER — Telehealth: Payer: Self-pay | Admitting: Internal Medicine

## 2010-02-27 NOTE — Progress Notes (Signed)
Summary: Rf Flexeril  Phone Note Refill Request Message from:  Fax from Pharmacy  Refills Requested: Medication #1:  FLEXERIL 10 MG TABS 1 by mouth three times a day prn   Dosage confirmed as above?Dosage Confirmed   Supply Requested: 180   Last Refilled: 11/18/2009  Method Requested: Electronic Next Appointment Scheduled: 03/11/10 Initial call taken by: Lanier Prude, [facility]),  February 14, 2010 3:26 PM  Follow-up for Phone Call        OK 2 ref Follow-up by: Tresa Garter MD,  February 14, 2010 5:34 PM    Prescriptions: FLEXERIL 10 MG TABS (CYCLOBENZAPRINE HCL) 1 by mouth three times a day prn  #180 x 2   Entered by:   Lanier Prude, CMA(AAMA)   Authorized by:   Tresa Garter MD   Signed by:   Lanier Prude, [facility]) on 02/17/2010   Method used:   Electronically to        [facility] [address] (retail)       [address]       [place], [place]  [address]       Ph: [phone] or [phone]       Fax: [phone]   RxID:   [id number]

## 2010-02-27 NOTE — Letter (Signed)
Summary: Letter from patient Re husband's care  Letter from patient Re husband's care   Imported By: Lester Metaline 01/30/2010 09:12:52  _____________________________________________________________________  External Attachment:    Type:   Image     Comment:   External Document

## 2010-03-04 ENCOUNTER — Other Ambulatory Visit: Payer: Self-pay

## 2010-03-11 ENCOUNTER — Ambulatory Visit: Payer: Self-pay | Admitting: Internal Medicine

## 2010-04-18 LAB — GLUCOSE, CAPILLARY
Glucose-Capillary: 106 mg/dL — ABNORMAL HIGH (ref 70–99)
Glucose-Capillary: 114 mg/dL — ABNORMAL HIGH (ref 70–99)
Glucose-Capillary: 116 mg/dL — ABNORMAL HIGH (ref 70–99)
Glucose-Capillary: 116 mg/dL — ABNORMAL HIGH (ref 70–99)
Glucose-Capillary: 116 mg/dL — ABNORMAL HIGH (ref 70–99)
Glucose-Capillary: 132 mg/dL — ABNORMAL HIGH (ref 70–99)
Glucose-Capillary: 134 mg/dL — ABNORMAL HIGH (ref 70–99)
Glucose-Capillary: 145 mg/dL — ABNORMAL HIGH (ref 70–99)
Glucose-Capillary: 162 mg/dL — ABNORMAL HIGH (ref 70–99)

## 2010-04-18 LAB — BASIC METABOLIC PANEL
BUN: 19 mg/dL (ref 6–23)
CO2: 24 mEq/L (ref 19–32)
Calcium: 7.9 mg/dL — ABNORMAL LOW (ref 8.4–10.5)
Calcium: 8.4 mg/dL (ref 8.4–10.5)
Calcium: 9.1 mg/dL (ref 8.4–10.5)
Creatinine, Ser: 1.01 mg/dL (ref 0.4–1.2)
Creatinine, Ser: 1.13 mg/dL (ref 0.4–1.2)
GFR calc Af Amer: 59 mL/min — ABNORMAL LOW (ref >60)
GFR calc Af Amer: >60 mL/min (ref >60)
GFR calc Af Amer: >60 mL/min (ref >60)
GFR calc non Af Amer: 49 mL/min — ABNORMAL LOW (ref >60)
GFR calc non Af Amer: 55 mL/min — ABNORMAL LOW (ref >60)
GFR calc non Af Amer: 58 mL/min — ABNORMAL LOW (ref >60)
Glucose, Bld: 134 mg/dL — ABNORMAL HIGH (ref 70–99)
Potassium: 3.2 mEq/L — ABNORMAL LOW (ref 3.5–5.1)
Sodium: 128 mEq/L — ABNORMAL LOW (ref 135–145)
Sodium: 131 mEq/L — ABNORMAL LOW (ref 135–145)

## 2010-04-18 LAB — PROTIME-INR
INR: 1.1 (ref 0.00–1.49)
INR: 1.2 (ref 0.00–1.49)
INR: 1.7 — ABNORMAL HIGH (ref 0.00–1.49)
Prothrombin Time: 13.9 seconds (ref 11.6–15.2)
Prothrombin Time: 15.1 seconds (ref 11.6–15.2)

## 2010-04-18 LAB — DIFFERENTIAL
Eosinophils Absolute: 0.2 10*3/uL (ref 0.0–0.7)
Lymphocytes Relative: 38 % (ref 12–46)
Lymphs Abs: 3.4 10*3/uL (ref 0.7–4.0)
Monocytes Relative: 9 % (ref 3–12)
Neutro Abs: 4.4 10*3/uL (ref 1.7–7.7)
Neutrophils Relative %: 50 % (ref 43–77)

## 2010-04-18 LAB — CBC
HCT: 27.5 % — ABNORMAL LOW (ref 36.0–46.0)
HCT: 31.3 % — ABNORMAL LOW (ref 36.0–46.0)
Hemoglobin: 10.8 g/dL — ABNORMAL LOW (ref 12.0–15.0)
Hemoglobin: 11 g/dL — ABNORMAL LOW (ref 12.0–15.0)
Hemoglobin: 9.5 g/dL — ABNORMAL LOW (ref 12.0–15.0)
MCHC: 34.2 g/dL (ref 30.0–36.0)
MCV: 91.6 fL (ref 78.0–100.0)
Platelets: 244 10*3/uL (ref 150–400)
RBC: 3 MIL/uL — ABNORMAL LOW (ref 3.87–5.11)
RBC: 3.43 MIL/uL — ABNORMAL LOW (ref 3.87–5.11)
RBC: 3.52 MIL/uL — ABNORMAL LOW (ref 3.87–5.11)
RBC: 4.58 MIL/uL (ref 3.87–5.11)
RDW: 12.9 % (ref 11.5–15.5)
WBC: 11.5 10*3/uL — ABNORMAL HIGH (ref 4.0–10.5)
WBC: 14.6 10*3/uL — ABNORMAL HIGH (ref 4.0–10.5)
WBC: 8.9 10*3/uL (ref 4.0–10.5)

## 2010-04-18 LAB — URINALYSIS, ROUTINE W REFLEX MICROSCOPIC
Nitrite: NEGATIVE
Protein, ur: NEGATIVE mg/dL
Urobilinogen, UA: 0.2 mg/dL (ref 0.0–1.0)

## 2010-04-18 LAB — URINE MICROSCOPIC-ADD ON

## 2010-04-18 LAB — ABO/RH: ABO/RH(D): A POS

## 2010-04-18 LAB — TYPE AND SCREEN: ABO/RH(D): A POS

## 2010-04-18 LAB — APTT: aPTT: 35 seconds (ref 24–37)

## 2010-05-07 ENCOUNTER — Other Ambulatory Visit: Payer: Self-pay | Admitting: Internal Medicine

## 2010-05-30 NOTE — Assessment & Plan Note (Signed)
[facility]                             PRIMARY CARE OFFICE NOTE   NAME:Gibbs, Tiffany Gibbs                       MRN:          [id number]  DATE:07/27/2005                            DOB:          1945/06/20    The patient is a 65 year old female who presents for a wellness examination.   PAST MEDICAL HISTORY/FAMILY HISTORY/SOCIAL HISTORY:  As per March 19, 2004  note.   CURRENT MEDICINES:  1.  Tylenol, Advil p.r.n.  2.  Celexa 40 mg daily.  3.  Lovastatin 40 mg daily.  4.  Flexeril 10 mg p.r.n.   REVIEW OF SYSTEMS:  Chronic problems with fibromyalgia, insomnia.  Unfortunately gaining weight.  Neck pain is much better with massage.  Arthritis is better in general.  The rest is negative.   PHYSICAL EXAMINATION:  VITAL SIGNS:  Blood pressure 150/81.  Pulse 101.  Temperature 99.1.  Weight 250 pounds.  GENERAL:  She is in no acute distress.  HEENT:  Moist mucosa.  NECK:  Supple.  No thyromegaly, bruits.  LUNGS:  Clear.  No wheeze or rales.  HEART:  S1, S2, no gallop.  ABDOMEN:  Obese, soft, nontender, no organomegaly, no mass palpable.  EXTREMITIES:  No edema.  NEUROLOGICAL:  She is alert and cooperative.  Denies being depressed.  CERVICAL SPINE: Full range of motion.   LABORATORY:  Labs on July 20, 2005, CBC normal, glucose 131.  CMET normal.  Cholesterol 202, LDL 144.  HDL 37.  TSH 2.5.  EKG with normal sinus rhythm.   ASSESSMENT AND PLAN:  Normal wellness examination.  Age/health-related  issues briefly discussed.  Lifestyle discussed. Needs to lose weight and  exercise.  Continue with calcium and vitamin D.  She used to see Dr.  Eda Paschal. Now she is status post complete hysterectomy and mammogram  yearly.  Zostavax information provided.  She will due colonoscopy in 2009.  1.  Elevated glucose.  She will try to lose weight.  Followup with me in      three months with A1C.  2.  Neck pain right sided, much improved.  3.  Osteoarthritis,  chronic.  She saw Dr. Renae Fickle and had several joint      injections.  4.  Hypertension not well controlled.  Add enalapril 10 mg daily.                                   Sonda Primes, MD   AP/MedQ  DD:  07/27/2005  DT:  07/28/2005  Job #:  [id number]

## 2010-06-13 ENCOUNTER — Telehealth: Payer: Self-pay | Admitting: Critical Care Medicine

## 2010-06-13 NOTE — Telephone Encounter (Signed)
Spoke with pt. She states that she was last seen by PW back in 09 for OSA and was started on CPAP. She is c/o having nightmares and thinks that this is due to lack of o2, and not enough pressure from CPAP. I advised she needs new consult with Sleep doc. Pt verbalized understanding, but states that she prefers to stick with PW. I advised that I am unsure of he is still accepting sleep pt's so will check with him.   Dr. Delford Field, I do not even see record of her being seen by you. Please advise thanks!

## 2010-06-14 ENCOUNTER — Other Ambulatory Visit: Payer: Self-pay | Admitting: Internal Medicine

## 2010-06-15 NOTE — Telephone Encounter (Signed)
If she is having nightmares, I prefer a sleep MD see her as a new consult

## 2010-06-16 NOTE — Telephone Encounter (Signed)
Called, spoke with pt.  She was informed PW recs she see a sleep MD d/t nightmares.  Pt ok with this and ok with scheduling  Consult at this time.  Sleep Consult scheduled with Dr. Shelle Iron on June 20 at 3:30pm -- pt aware to arrive at 3:15 to fill out paperwork and bring all meds to OV.  She verbalized understanding of this and voiced no further questions at this time.   Note:  Pt stated she had Medicare [employer] insurance and wanted to know if she needed a referral from PCP for this consult.  I spoke with Misty Stanley B.  And was advised she would not need a referral for this.

## 2010-06-16 NOTE — Telephone Encounter (Signed)
Flonase request: last filled 10/08/09 #[id number] Rx Done.

## 2010-06-27 ENCOUNTER — Encounter: Payer: Self-pay | Admitting: Pulmonary Disease

## 2010-07-02 ENCOUNTER — Other Ambulatory Visit: Payer: Self-pay | Admitting: Internal Medicine

## 2010-07-02 ENCOUNTER — Other Ambulatory Visit: Payer: Self-pay

## 2010-07-02 ENCOUNTER — Encounter: Payer: Self-pay | Admitting: Pulmonary Disease

## 2010-07-02 ENCOUNTER — Ambulatory Visit (INDEPENDENT_AMBULATORY_CARE_PROVIDER_SITE_OTHER): Payer: PRIVATE HEALTH INSURANCE | Admitting: Pulmonary Disease

## 2010-07-02 ENCOUNTER — Telehealth: Payer: Self-pay | Admitting: *Deleted

## 2010-07-02 VITALS — BP 116/74 | HR 83 | Temp 98.0°F | Ht 63.5 in | Wt 224.8 lb

## 2010-07-02 DIAGNOSIS — E119 Type 2 diabetes mellitus without complications: Secondary | ICD-10-CM

## 2010-07-02 DIAGNOSIS — Z0000 Encounter for general adult medical examination without abnormal findings: Secondary | ICD-10-CM

## 2010-07-02 DIAGNOSIS — G4733 Obstructive sleep apnea (adult) (pediatric): Secondary | ICD-10-CM

## 2010-07-02 NOTE — Patient Instructions (Signed)
Will get you a new cpap machine, and set on automatic mode for the next 2-3 weeks to optimize your pressure.  Will let you know the results. Work on weight loss followup with me in 6mos if doing well.

## 2010-07-02 NOTE — Progress Notes (Signed)
  Subjective:    Patient ID: Tiffany Gibbs, female    DOB: August 27, 1945, 65 y.o.   MRN: [id number]  HPI The pt is a 65y/o female who comes in today for management of osa.  She was diagnosed in 2003 with severe osa, with AHI 68/hr, and found to have therapeutic cpap pressure of 10cm.  She has been wearing cpap at 11cm, and uses the same machine since the original sleep study.  She uses a nasal mask that is 65yrs old, and is due for a replacement.  She is not rested in am's upon arising, and notes EDS with periods of inactivity.  She will often take naps in the am's.  Her weight has been steadily increasing since her original study.  Her epworth score today is 14.  Sleep Questionnaire: What time do you typically go to bed?( Between what hours) 11:30 pm to 1 am How long does it take you to fall asleep? just depends How many times during the night do you wake up? What time do you get out of bed to start your day? 0930 Do you drive or operate heavy machinery in your occupation? No How much has your weight changed (up or down) over the past two years? (In pounds) 10 lb (4.536 kg) Have you ever had a sleep study before? Yes If yes, location of study? [facility] If yes, date of study? 2003 Do you currently use CPAP? Yes If so, what pressure? 11 cm Do you wear oxygen at any time? No   Review of Systems  Constitutional: Negative for fever and unexpected weight change.  HENT: Positive for congestion. Negative for ear pain, nosebleeds, sore throat, rhinorrhea, sneezing, trouble swallowing, dental problem, postnasal drip and sinus pressure.   Eyes: Negative for redness and itching.  Respiratory: Negative for cough, chest tightness, shortness of breath and wheezing.   Cardiovascular: Negative for palpitations and leg swelling.  Gastrointestinal: Negative for nausea and vomiting.  Genitourinary: Negative for dysuria.  Musculoskeletal: Positive for joint swelling.  Skin: Negative for rash.  Neurological: Positive for  headaches.  Hematological: Does not bruise/bleed easily.  Psychiatric/Behavioral: Positive for dysphoric mood. The patient is not nervous/anxious.        Objective:   Physical Exam Constitutional:obese female, no acute distress  HENT:  Nares patent without discharge, but turbinate hypertrophy.  Deviation to left  Oropharynx without exudate, palate and uvula are significantly elongated.   Eyes:  Perrla, eomi, no scleral icterus  Neck:  No JVD, no TMG  Cardiovascular:  Normal rate, regular rhythm, no rubs or gallops.  No murmurs        Intact distal pulses  Pulmonary :  Normal breath sounds, no stridor or respiratory distress   No rales, rhonchi, or wheezing  Abdominal:  Soft, nondistended, bowel sounds present.  No tenderness noted.   Musculoskeletal:  mild lower extremity edema noted.  Lymph Nodes:  No cervical lymphadenopathy noted  Skin:  No cyanosis noted  Neurologic:  Alert, appropriate, moves all 4 extremities without obvious deficit.         Assessment & Plan:

## 2010-07-02 NOTE — Telephone Encounter (Signed)
Pt states that she is having labs done tomorrow &  Is requesting to have an order for lab to "check and see if there is any metal in her blood, due to hip replacement".

## 2010-07-02 NOTE — Assessment & Plan Note (Signed)
The pt has a h/o severe osa, and currently is not sleeping well with significant daytime sleepiness with inactivity.  She has a very old machine, and has also gained weight since her last titration.  Her mask is old as well.  At this point, she will need a new cpap machine, and will re-optimize her pressure on auto for the first 2 weeks.  I have also asked her to work on weight reduction.

## 2010-07-03 ENCOUNTER — Telehealth: Payer: Self-pay | Admitting: Internal Medicine

## 2010-07-03 ENCOUNTER — Encounter: Payer: Self-pay | Admitting: Pulmonary Disease

## 2010-07-03 ENCOUNTER — Other Ambulatory Visit (INDEPENDENT_AMBULATORY_CARE_PROVIDER_SITE_OTHER): Payer: PRIVATE HEALTH INSURANCE

## 2010-07-03 ENCOUNTER — Other Ambulatory Visit (INDEPENDENT_AMBULATORY_CARE_PROVIDER_SITE_OTHER): Payer: PRIVATE HEALTH INSURANCE | Admitting: Internal Medicine

## 2010-07-03 ENCOUNTER — Other Ambulatory Visit: Payer: Self-pay

## 2010-07-03 ENCOUNTER — Encounter: Payer: Self-pay | Admitting: Critical Care Medicine

## 2010-07-03 DIAGNOSIS — Z0000 Encounter for general adult medical examination without abnormal findings: Secondary | ICD-10-CM

## 2010-07-03 DIAGNOSIS — E119 Type 2 diabetes mellitus without complications: Secondary | ICD-10-CM

## 2010-07-03 LAB — CBC WITH DIFFERENTIAL/PLATELET
Basophils Absolute: 0 10*3/uL (ref 0.0–0.1)
Eosinophils Absolute: 0.1 10*3/uL (ref 0.0–0.7)
Hemoglobin: 14 g/dL (ref 12.0–15.0)
Lymphocytes Relative: 41.8 % (ref 12.0–46.0)
MCHC: 34.8 g/dL (ref 30.0–36.0)
MCV: 89.9 fl (ref 78.0–100.0)
Monocytes Absolute: 0.7 10*3/uL (ref 0.1–1.0)
Neutro Abs: 3 10*3/uL (ref 1.4–7.7)
Neutrophils Relative %: 45.7 % (ref 43.0–77.0)
RDW: 13 % (ref 11.5–14.6)

## 2010-07-03 LAB — URINALYSIS, ROUTINE W REFLEX MICROSCOPIC
Bilirubin Urine: NEGATIVE
Urine Glucose: 100
Urobilinogen, UA: 1 (ref 0.0–1.0)

## 2010-07-03 LAB — BASIC METABOLIC PANEL
BUN: 14 mg/dL (ref 6–23)
Creatinine, Ser: 0.9 mg/dL (ref 0.4–1.2)
GFR: 66.79 mL/min (ref >60.00)

## 2010-07-03 LAB — LDL CHOLESTEROL, DIRECT: Direct LDL: 168.6 mg/dL

## 2010-07-03 LAB — HEPATIC FUNCTION PANEL
AST: 28 U/L (ref 0–37)
Albumin: 4.2 g/dL (ref 3.5–5.2)
Alkaline Phosphatase: 60 U/L (ref 39–117)
Total Protein: 5.8 g/dL — ABNORMAL LOW (ref 6.0–8.3)

## 2010-07-03 LAB — LIPID PANEL: Cholesterol: 262 mg/dL — ABNORMAL HIGH (ref 0–200)

## 2010-07-03 MED ORDER — CIPROFLOXACIN HCL 500 MG PO TABS: 500.0000 mg | ORAL_TABLET | Freq: Two times a day (BID) | ORAL | Status: DC

## 2010-07-03 MED ORDER — CIPROFLOXACIN HCL 250 MG PO TABS: 250.0000 mg | ORAL_TABLET | Freq: Two times a day (BID) | ORAL | Status: DC

## 2010-07-03 NOTE — Telephone Encounter (Signed)
I would start with a heavy metal urine screen Thx

## 2010-07-03 NOTE — Telephone Encounter (Signed)
Misty Stanley, please, inform patient that she has a uti Take Cipro Thx

## 2010-07-03 NOTE — Telephone Encounter (Signed)
Left mess to call office back.  Can not add request to current urine as it was dumped. Pt has apt 6/28 - she can discuss at OV.   ALSO - pt has UTI, needs RX for CIPRO 250 mg bid x 5 days - Rx done.

## 2010-07-04 NOTE — Telephone Encounter (Signed)
busy

## 2010-07-04 NOTE — Telephone Encounter (Signed)
Pt returned Tiffany Gibbs call. I informed pt that urinalysis did come back to show a UTI, medication sent to [facility] on [address]

## 2010-07-04 NOTE — Telephone Encounter (Signed)
Pt advised per AMI, see other phone note.

## 2010-07-07 ENCOUNTER — Encounter: Payer: Self-pay | Admitting: Pulmonary Disease

## 2010-07-09 ENCOUNTER — Encounter: Payer: Self-pay | Admitting: Pulmonary Disease

## 2010-07-10 ENCOUNTER — Ambulatory Visit (INDEPENDENT_AMBULATORY_CARE_PROVIDER_SITE_OTHER): Payer: PRIVATE HEALTH INSURANCE | Admitting: Internal Medicine

## 2010-07-10 ENCOUNTER — Encounter: Payer: Self-pay | Admitting: Internal Medicine

## 2010-07-10 DIAGNOSIS — F329 Major depressive disorder, single episode, unspecified: Secondary | ICD-10-CM

## 2010-07-10 DIAGNOSIS — F411 Generalized anxiety disorder: Secondary | ICD-10-CM

## 2010-07-10 DIAGNOSIS — E119 Type 2 diabetes mellitus without complications: Secondary | ICD-10-CM

## 2010-07-10 DIAGNOSIS — IMO0001 Reserved for inherently not codable concepts without codable children: Secondary | ICD-10-CM

## 2010-07-10 DIAGNOSIS — M797 Fibromyalgia: Secondary | ICD-10-CM

## 2010-07-10 DIAGNOSIS — G4733 Obstructive sleep apnea (adult) (pediatric): Secondary | ICD-10-CM

## 2010-07-10 DIAGNOSIS — I10 Essential (primary) hypertension: Secondary | ICD-10-CM

## 2010-07-10 MED ORDER — TRAMADOL HCL 50 MG PO TABS: ORAL_TABLET | ORAL | Status: DC

## 2010-07-10 NOTE — Assessment & Plan Note (Addendum)
Rating her pain at 9/10 Increase Tramadol to qid RTC 2 mo Handicapped form

## 2010-07-10 NOTE — Assessment & Plan Note (Signed)
She is getting a new CPAP machine

## 2010-07-10 NOTE — Progress Notes (Signed)
  Subjective:    Patient ID: Tiffany Gibbs, female    DOB: 1945-03-20, 65 y.o.   MRN: [id number]  HPI  The patient presents for a follow-up of  chronic hypertension, chronic dyslipidemia, type 2 diabetes controlled with medicines C/o R post calf pain off and on      Review of Systems  Constitutional: Negative for chills, activity change, appetite change, fatigue and unexpected weight change.  HENT: Negative for congestion, mouth sores and sinus pressure.   Eyes: Negative for visual disturbance.  Respiratory: Negative for cough and chest tightness.   Gastrointestinal: Negative for nausea and abdominal pain.  Genitourinary: Negative for frequency, difficulty urinating and vaginal pain.  Musculoskeletal: Positive for back pain, arthralgias and gait problem.  Skin: Negative for pallor and rash.  Neurological: Negative for dizziness, tremors, weakness, numbness and headaches.  Psychiatric/Behavioral: Negative for confusion and sleep disturbance.   Wt Readings from Last 3 Encounters:  07/10/10 221 lb (100.245 kg)  07/02/10 224 lb 12.8 oz (101.969 kg)  11/25/09 223 lb (101.152 kg)       Objective:   Physical Exam  Constitutional: She appears well-developed. No distress.       Obese  HENT:  Head: Normocephalic.  Right Ear: External ear normal.  Left Ear: External ear normal.  Nose: Nose normal.  Mouth/Throat: Oropharynx is clear and moist.  Eyes: Conjunctivae are normal. Pupils are equal, round, and reactive to light. Right eye exhibits no discharge. Left eye exhibits no discharge.  Neck: Normal range of motion. Neck supple. No JVD present. No tracheal deviation present. No thyromegaly present.  Cardiovascular: Normal rate, regular rhythm and normal heart sounds.   Pulmonary/Chest: No stridor. No respiratory distress. She has no wheezes.  Abdominal: Soft. Bowel sounds are normal. She exhibits no distension and no mass. There is no tenderness. There is no rebound and no guarding.    Musculoskeletal: She exhibits no edema and no tenderness.  Lymphadenopathy:    She has no cervical adenopathy.  Neurological: She displays normal reflexes. No cranial nerve deficit. She exhibits normal muscle tone. Coordination normal.  Skin: No rash noted. No erythema.  Psychiatric: She has a normal mood and affect. Her behavior is normal. Judgment and thought content normal.         Lab Results  Component Value Date   WBC 6.6 07/03/2010   HGB 14.0 07/03/2010   HCT 40.2 07/03/2010   PLT 232.0 07/03/2010   CHOL 262* 07/03/2010   TRIG 126.0 07/03/2010   HDL 48.30 07/03/2010   LDLDIRECT 168.6 07/03/2010   ALT 41* 07/03/2010   AST 28 07/03/2010   NA 137 07/03/2010   K 3.7 07/03/2010   CL 99 07/03/2010   CREATININE 0.9 07/03/2010   BUN 14 07/03/2010   CO2 28 07/03/2010   TSH 2.20 07/03/2010   INR 2.0* 09/22/2008   HGBA1C 6.4 07/03/2010    Assessment & Plan:

## 2010-07-10 NOTE — Assessment & Plan Note (Signed)
On Rx 

## 2010-07-10 NOTE — Assessment & Plan Note (Signed)
Controlled on Rx 

## 2010-07-12 ENCOUNTER — Encounter: Payer: Self-pay | Admitting: Internal Medicine

## 2010-07-17 ENCOUNTER — Telehealth: Payer: Self-pay | Admitting: *Deleted

## 2010-07-17 DIAGNOSIS — R350 Frequency of micturition: Secondary | ICD-10-CM

## 2010-07-17 NOTE — Telephone Encounter (Signed)
Pt continues to feel she may have a "urinary problem" She completed cipro recently but continues to c/o difficulty urinating. Please advise.

## 2010-07-17 NOTE — Telephone Encounter (Signed)
Repeat UA Thx

## 2010-07-17 NOTE — Telephone Encounter (Signed)
Patient informed, Hold mess open for results.

## 2010-07-18 ENCOUNTER — Other Ambulatory Visit (INDEPENDENT_AMBULATORY_CARE_PROVIDER_SITE_OTHER): Payer: PRIVATE HEALTH INSURANCE

## 2010-07-18 DIAGNOSIS — R350 Frequency of micturition: Secondary | ICD-10-CM

## 2010-07-18 LAB — URINALYSIS, ROUTINE W REFLEX MICROSCOPIC
Ketones, ur: NEGATIVE
Nitrite: NEGATIVE
Specific Gravity, Urine: >=1.03 (ref 1.000–1.030)
pH: 5.5 (ref 5.0–8.0)

## 2010-07-18 MED ORDER — PHENAZOPYRIDINE HCL 100 MG PO TABS: 100.0000 mg | ORAL_TABLET | Freq: Three times a day (TID) | ORAL | Status: AC | PRN

## 2010-07-18 NOTE — Telephone Encounter (Signed)
UA is OK Use Pyridium x few days OV if not better Thx

## 2010-07-18 NOTE — Telephone Encounter (Signed)
Patient informed. 

## 2010-07-18 NOTE — Telephone Encounter (Signed)
Results ready, please advise.

## 2010-09-12 ENCOUNTER — Ambulatory Visit: Payer: PRIVATE HEALTH INSURANCE | Admitting: Internal Medicine

## 2010-09-23 ENCOUNTER — Ambulatory Visit: Payer: PRIVATE HEALTH INSURANCE | Admitting: Internal Medicine

## 2010-10-21 ENCOUNTER — Other Ambulatory Visit: Payer: Self-pay | Admitting: Internal Medicine

## 2010-10-21 ENCOUNTER — Telehealth: Payer: Self-pay | Admitting: *Deleted

## 2010-10-21 DIAGNOSIS — I10 Essential (primary) hypertension: Secondary | ICD-10-CM

## 2010-10-21 DIAGNOSIS — E119 Type 2 diabetes mellitus without complications: Secondary | ICD-10-CM

## 2010-10-21 NOTE — Telephone Encounter (Signed)
1. Patient requesting RF of tramadol  2. Patient requesting to know if she needs labs prior to next OV?

## 2010-10-22 MED ORDER — TRAMADOL HCL 50 MG PO TABS: ORAL_TABLET | ORAL | Status: DC

## 2010-10-22 NOTE — Telephone Encounter (Signed)
OK to fill this prescription with additional refills x3 Labs -- A1c, BMET Thank you!

## 2010-10-22 NOTE — Telephone Encounter (Signed)
Patient called again req RF of tramadol and labs prior to OV. Please advise  NEED TO CALL PT WHEN COMPLETE

## 2010-10-22 NOTE — Telephone Encounter (Signed)
Sent in [place], labs ordered. Left pt VM on HM # to inform

## 2010-10-29 ENCOUNTER — Other Ambulatory Visit (INDEPENDENT_AMBULATORY_CARE_PROVIDER_SITE_OTHER): Payer: PRIVATE HEALTH INSURANCE

## 2010-10-29 DIAGNOSIS — E119 Type 2 diabetes mellitus without complications: Secondary | ICD-10-CM

## 2010-10-29 DIAGNOSIS — I10 Essential (primary) hypertension: Secondary | ICD-10-CM

## 2010-10-29 LAB — BASIC METABOLIC PANEL WITH GFR
BUN: 15 mg/dL (ref 6–23)
CO2: 29 meq/L (ref 19–32)
Calcium: 9 mg/dL (ref 8.4–10.5)
Chloride: 103 meq/L (ref 96–112)
Creatinine, Ser: 0.9 mg/dL (ref 0.4–1.2)
GFR: 71.27 mL/min
Glucose, Bld: 95 mg/dL (ref 70–99)
Potassium: 3.8 meq/L (ref 3.5–5.1)
Sodium: 138 meq/L (ref 135–145)

## 2010-10-29 LAB — HEMOGLOBIN A1C: Hgb A1c MFr Bld: 6 % (ref 4.6–6.5)

## 2010-11-03 ENCOUNTER — Ambulatory Visit (INDEPENDENT_AMBULATORY_CARE_PROVIDER_SITE_OTHER): Payer: PRIVATE HEALTH INSURANCE | Admitting: Internal Medicine

## 2010-11-03 ENCOUNTER — Encounter: Payer: Self-pay | Admitting: Internal Medicine

## 2010-11-03 VITALS — BP 110/62 | HR 88 | Temp 97.9°F | Resp 16 | Wt 216.0 lb

## 2010-11-03 DIAGNOSIS — I10 Essential (primary) hypertension: Secondary | ICD-10-CM

## 2010-11-03 DIAGNOSIS — G4733 Obstructive sleep apnea (adult) (pediatric): Secondary | ICD-10-CM

## 2010-11-03 DIAGNOSIS — IMO0001 Reserved for inherently not codable concepts without codable children: Secondary | ICD-10-CM

## 2010-11-03 DIAGNOSIS — R413 Other amnesia: Secondary | ICD-10-CM | POA: Insufficient documentation

## 2010-11-03 DIAGNOSIS — M797 Fibromyalgia: Secondary | ICD-10-CM

## 2010-11-03 DIAGNOSIS — Z23 Encounter for immunization: Secondary | ICD-10-CM

## 2010-11-03 DIAGNOSIS — F329 Major depressive disorder, single episode, unspecified: Secondary | ICD-10-CM

## 2010-11-03 DIAGNOSIS — E119 Type 2 diabetes mellitus without complications: Secondary | ICD-10-CM

## 2010-11-03 DIAGNOSIS — F411 Generalized anxiety disorder: Secondary | ICD-10-CM

## 2010-11-03 NOTE — Assessment & Plan Note (Signed)
Continue with current prescription therapy as reflected on the Med list.  

## 2010-11-03 NOTE — Assessment & Plan Note (Signed)
Cont w/CPAP 

## 2010-11-03 NOTE — Assessment & Plan Note (Signed)
Try to cut back on Tramadol and other meds

## 2010-11-03 NOTE — Progress Notes (Signed)
  Subjective:    Patient ID: Tiffany Gibbs, female    DOB: [birth year], 65 y.o.   MRN: [id number]  HPI  The patient presents for a follow-up of  chronic hypertension, chronic dyslipidemia, type 2 diabetes controlled with medicines C/o memory issues   Wt Readings from Last 3 Encounters:  11/03/10 216 lb (97.977 kg)  07/10/10 221 lb (100.245 kg)  07/02/10 224 lb 12.8 oz (101.969 kg)     Review of Systems  Constitutional: Negative for chills, activity change, appetite change, fatigue and unexpected weight change.  HENT: Negative for congestion, mouth sores and sinus pressure.   Eyes: Negative for visual disturbance.  Respiratory: Negative for cough and chest tightness.   Gastrointestinal: Negative for nausea and abdominal pain.  Genitourinary: Negative for frequency, difficulty urinating and vaginal pain.  Musculoskeletal: Negative for back pain and gait problem.  Skin: Negative for pallor and rash.  Neurological: Negative for dizziness, tremors, weakness, numbness and headaches.  Psychiatric/Behavioral: Negative for confusion and sleep disturbance. The patient is not nervous/anxious.        Objective:   Physical Exam  Constitutional: She appears well-developed and well-nourished. No distress.  HENT:  Head: Normocephalic.  Right Ear: External ear normal.  Left Ear: External ear normal.  Nose: Nose normal.  Mouth/Throat: Oropharynx is clear and moist.  Eyes: Conjunctivae are normal. Pupils are equal, round, and reactive to light. Right eye exhibits no discharge. Left eye exhibits no discharge.  Neck: Normal range of motion. Neck supple. No JVD present. No tracheal deviation present. No thyromegaly present.  Cardiovascular: Normal rate, regular rhythm and normal heart sounds.   Pulmonary/Chest: No stridor. No respiratory distress. She has no wheezes.  Abdominal: Soft. Bowel sounds are normal. She exhibits no distension and no mass. There is no tenderness. There is no rebound and  no guarding.  Musculoskeletal: She exhibits no edema and no tenderness.  Lymphadenopathy:    She has no cervical adenopathy.  Neurological: She displays normal reflexes. No cranial nerve deficit. She exhibits normal muscle tone. Coordination normal.  Skin: No rash noted. No erythema.  Psychiatric: She has a normal mood and affect. Her behavior is normal. Judgment and thought content normal.     Lab Results  Component Value Date   WBC 6.6 07/03/2010   HGB 14.0 07/03/2010   HCT 40.2 07/03/2010   PLT 232.0 07/03/2010   GLUCOSE 95 10/29/2010   CHOL 262* 07/03/2010   TRIG 126.0 07/03/2010   HDL 48.30 07/03/2010   LDLDIRECT 168.6 07/03/2010   LDLCALC 135* 10/08/2008   ALT 41* 07/03/2010   AST 28 07/03/2010   NA 138 10/29/2010   K 3.8 10/29/2010   CL 103 10/29/2010   CREATININE 0.9 10/29/2010   BUN 15 10/29/2010   CO2 29 10/29/2010   TSH 2.20 07/03/2010   INR 2.0* 09/22/2008   HGBA1C 6.0 10/29/2010        Assessment & Plan:

## 2010-11-03 NOTE — Assessment & Plan Note (Signed)
Continue with current prescription therapy as reflected on the Med list. Better 

## 2010-11-12 ENCOUNTER — Telehealth: Payer: Self-pay | Admitting: *Deleted

## 2010-11-12 NOTE — Telephone Encounter (Signed)
Spoke w/patient - she is Surveyor, quantity for zpak. C/o sinus congestion. No fever, chills, body aches, cough, sinus pain or any other complaints. She is taking mucinex which has helped. I advised patient that she needs to continue mucinex and rest. Call office and schedule eval with any fever, chills, sinus pain, productive cough etc.. Pt agreed.

## 2010-11-12 NOTE — Telephone Encounter (Signed)
Agree. Thx 

## 2010-11-28 ENCOUNTER — Other Ambulatory Visit: Payer: Self-pay | Admitting: Internal Medicine

## 2010-12-30 ENCOUNTER — Telehealth: Payer: Self-pay | Admitting: Pulmonary Disease

## 2010-12-30 NOTE — Telephone Encounter (Signed)
No we haven't received download yet. And i know she has an appt tomorrow with KC.  I've already called [facility] in [place] and requested the download.  Just waiting on them to fax it.

## 2010-12-30 NOTE — Telephone Encounter (Signed)
Pt is aware of this.  °

## 2010-12-30 NOTE — Telephone Encounter (Signed)
Megan have you seen pt's daownload. Thanks

## 2010-12-31 ENCOUNTER — Encounter: Payer: Self-pay | Admitting: Pulmonary Disease

## 2010-12-31 ENCOUNTER — Other Ambulatory Visit: Payer: Self-pay | Admitting: Pulmonary Disease

## 2010-12-31 ENCOUNTER — Ambulatory Visit (INDEPENDENT_AMBULATORY_CARE_PROVIDER_SITE_OTHER): Payer: PRIVATE HEALTH INSURANCE | Admitting: Pulmonary Disease

## 2010-12-31 VITALS — BP 104/70 | HR 79 | Temp 97.6°F | Ht 63.5 in | Wt 219.0 lb

## 2010-12-31 DIAGNOSIS — G4733 Obstructive sleep apnea (adult) (pediatric): Secondary | ICD-10-CM

## 2010-12-31 NOTE — Patient Instructions (Signed)
Will have your pressure set at a fixed level of 11cm Talk with your dme about getting a better fitting mask Work on weight loss followup with me in 12 mos if doing well, but call if having issues.

## 2010-12-31 NOTE — Progress Notes (Signed)
  Subjective:    Patient ID: Tiffany Gibbs, female    DOB: 11/26/1945, 65 y.o.   MRN: [id number]  HPI The patient comes in today for followup of her known obstructive sleep apnea.  She is wearing her CPAP compliantly, and currently is on the automatic mode.  Her download today shows excellent compliance, no significant leaks, and an optimal pressure of 11 cm.  The patient currently had issues with her new mask, and has gone back to her old mask.  She will try and get a replacement in January.   Review of Systems  Constitutional: Negative for fever and unexpected weight change.  HENT: Positive for congestion, rhinorrhea, postnasal drip and sinus pressure. Negative for ear pain, nosebleeds, sore throat, sneezing, trouble swallowing and dental problem.   Eyes: Negative for redness and itching.  Respiratory: Positive for cough. Negative for chest tightness, shortness of breath and wheezing.   Cardiovascular: Negative for palpitations and leg swelling.  Gastrointestinal: Negative for nausea and vomiting.  Genitourinary: Negative for dysuria.  Musculoskeletal: Negative for joint swelling.  Skin: Negative for rash.  Neurological: Positive for headaches.  Hematological: Does not bruise/bleed easily.  Psychiatric/Behavioral: Negative for dysphoric mood. The patient is not nervous/anxious.        Objective:   Physical Exam Overweight female in no acute distress No skin breakdown or pressure necrosis from the CPAP mask Lower extremities with mild edema, no cyanosis noted Alert, does not appear to be sleepy, moves all 4 extremities.       Assessment & Plan:

## 2010-12-31 NOTE — Assessment & Plan Note (Signed)
The patient is doing well with CPAP, and is having no issues with her pressure tolerance.  Her optimal pressure appears to be 11 cm.  She is having the mask fit issues, and has gone back to her old mask.  I have told her if this fits well, would get the same mask next month when she is eligible.  She feels that she is sleeping fairly well with adequate daytime alertness.  I have encouraged her to work aggressively on weight loss as well.

## 2011-01-02 ENCOUNTER — Telehealth: Payer: Self-pay | Admitting: *Deleted

## 2011-01-02 ENCOUNTER — Ambulatory Visit: Payer: PRIVATE HEALTH INSURANCE | Admitting: Internal Medicine

## 2011-01-02 NOTE — Telephone Encounter (Signed)
Pt states her medicare plan has informed her that cyclobenzaprine is no longer going to be covered by her part D rx plan eff 01-13-11 due to risks of certain side effects. She wants [place] to call to see if this med can continue to be covered. Will call [phone] to see what info is needed.

## 2011-01-07 ENCOUNTER — Telehealth: Payer: Self-pay | Admitting: *Deleted

## 2011-01-07 NOTE — Telephone Encounter (Signed)
Med approved. See 01-07-11 phone note.

## 2011-01-07 NOTE — Telephone Encounter (Signed)
Called pt's Rx plan and gave pertinent info to [facility]. Cyclobenzaprine approved effective 01-13-11 until 01-12-12. Left detailed mess informing pt med approved.

## 2011-02-10 ENCOUNTER — Encounter: Payer: Self-pay | Admitting: Internal Medicine

## 2011-02-18 ENCOUNTER — Telehealth: Payer: Self-pay

## 2011-02-18 DIAGNOSIS — E119 Type 2 diabetes mellitus without complications: Secondary | ICD-10-CM

## 2011-02-18 DIAGNOSIS — E785 Hyperlipidemia, unspecified: Secondary | ICD-10-CM

## 2011-02-18 DIAGNOSIS — I10 Essential (primary) hypertension: Secondary | ICD-10-CM

## 2011-02-18 DIAGNOSIS — R3915 Urgency of urination: Secondary | ICD-10-CM

## 2011-02-18 NOTE — Telephone Encounter (Signed)
Pt is requesting labs done (tomorrow if possible) prior to appt on 02/13, to include a AU for urgency and frequency.

## 2011-02-18 NOTE — Telephone Encounter (Signed)
OK - UA, CMET, CBC, TSH, A1c Thx

## 2011-02-19 ENCOUNTER — Other Ambulatory Visit (INDEPENDENT_AMBULATORY_CARE_PROVIDER_SITE_OTHER): Payer: PRIVATE HEALTH INSURANCE

## 2011-02-19 DIAGNOSIS — E119 Type 2 diabetes mellitus without complications: Secondary | ICD-10-CM

## 2011-02-19 LAB — BASIC METABOLIC PANEL
CO2: 30 mEq/L (ref 19–32)
Chloride: 104 mEq/L (ref 96–112)
Creatinine, Ser: 0.9 mg/dL (ref 0.4–1.2)

## 2011-02-19 NOTE — Telephone Encounter (Signed)
DM, HTN, dyslipidemia Thx

## 2011-02-19 NOTE — Telephone Encounter (Signed)
Dx code for TSH please, Thanks!

## 2011-02-20 ENCOUNTER — Other Ambulatory Visit: Payer: Self-pay | Admitting: Internal Medicine

## 2011-02-20 NOTE — Telephone Encounter (Signed)
Pt states she already came to have labs drawn.

## 2011-02-25 ENCOUNTER — Ambulatory Visit (INDEPENDENT_AMBULATORY_CARE_PROVIDER_SITE_OTHER): Payer: PRIVATE HEALTH INSURANCE | Admitting: Internal Medicine

## 2011-02-25 ENCOUNTER — Encounter: Payer: Self-pay | Admitting: Internal Medicine

## 2011-02-25 VITALS — BP 100/70 | HR 84 | Temp 97.4°F | Resp 16 | Wt 217.0 lb

## 2011-02-25 DIAGNOSIS — R7309 Other abnormal glucose: Secondary | ICD-10-CM

## 2011-02-25 DIAGNOSIS — R202 Paresthesia of skin: Secondary | ICD-10-CM

## 2011-02-25 DIAGNOSIS — IMO0001 Reserved for inherently not codable concepts without codable children: Secondary | ICD-10-CM

## 2011-02-25 DIAGNOSIS — M797 Fibromyalgia: Secondary | ICD-10-CM

## 2011-02-25 DIAGNOSIS — R209 Unspecified disturbances of skin sensation: Secondary | ICD-10-CM

## 2011-02-25 DIAGNOSIS — Z23 Encounter for immunization: Secondary | ICD-10-CM

## 2011-02-25 DIAGNOSIS — F329 Major depressive disorder, single episode, unspecified: Secondary | ICD-10-CM

## 2011-02-25 DIAGNOSIS — E785 Hyperlipidemia, unspecified: Secondary | ICD-10-CM

## 2011-02-25 DIAGNOSIS — F411 Generalized anxiety disorder: Secondary | ICD-10-CM

## 2011-02-25 DIAGNOSIS — E119 Type 2 diabetes mellitus without complications: Secondary | ICD-10-CM

## 2011-02-25 NOTE — Assessment & Plan Note (Signed)
Continue with current prescription therapy as reflected on the Med list.  

## 2011-02-25 NOTE — Patient Instructions (Signed)
Use the wrist splint.

## 2011-02-25 NOTE — Progress Notes (Signed)
Patient ID: Tiffany Gibbs, female   DOB: Sep 15, 1945, 66 y.o.   MRN: [id number]  Subjective:    Patient ID: Tiffany Gibbs, female    DOB: 1945/10/19, 66 y.o.   MRN: [id number]  HPI  The patient presents for a follow-up of  chronic hypertension, chronic dyslipidemia, type 2 diabetes controlled with medicines C/o memory issues C/o tingling in R>L hands      Review of Systems  Constitutional: Negative for chills, activity change, appetite change, fatigue and unexpected weight change.  HENT: Negative for congestion, mouth sores and sinus pressure.   Eyes: Negative for visual disturbance.  Respiratory: Negative for cough and chest tightness.   Gastrointestinal: Negative for nausea and abdominal pain.  Genitourinary: Negative for frequency, difficulty urinating and vaginal pain.  Musculoskeletal: Negative for back pain and gait problem.  Skin: Negative for pallor and rash.  Neurological: Negative for dizziness, tremors, weakness, numbness and headaches.  Psychiatric/Behavioral: Negative for confusion and sleep disturbance. The patient is not nervous/anxious.        Objective:   Physical Exam  Constitutional: She appears well-developed and well-nourished. No distress.  HENT:  Head: Normocephalic.  Right Ear: External ear normal.  Left Ear: External ear normal.  Nose: Nose normal.  Mouth/Throat: Oropharynx is clear and moist.  Eyes: Conjunctivae are normal. Pupils are equal, round, and reactive to light. Right eye exhibits no discharge. Left eye exhibits no discharge.  Neck: Normal range of motion. Neck supple. No JVD present. No tracheal deviation present. No thyromegaly present.  Cardiovascular: Normal rate, regular rhythm and normal heart sounds.   Pulmonary/Chest: No stridor. No respiratory distress. She has no wheezes.  Abdominal: Soft. Bowel sounds are normal. She exhibits no distension and no mass. There is no tenderness. There is no rebound and no guarding.  Musculoskeletal:  She exhibits no edema and no tenderness.  Lymphadenopathy:    She has no cervical adenopathy.  Neurological: She displays normal reflexes. No cranial nerve deficit. She exhibits normal muscle tone. Coordination normal.  Skin: No rash noted. No erythema.  Psychiatric: She has a normal mood and affect. Her behavior is normal. Judgment and thought content normal.  Tinnel is + B R>L   Lab Results  Component Value Date   WBC 6.6 07/03/2010   HGB 14.0 07/03/2010   HCT 40.2 07/03/2010   PLT 232.0 07/03/2010   GLUCOSE 95 02/19/2011   CHOL 262* 07/03/2010   TRIG 126.0 07/03/2010   HDL 48.30 07/03/2010   LDLDIRECT 168.6 07/03/2010   LDLCALC 135* 10/08/2008   ALT 41* 07/03/2010   AST 28 07/03/2010   NA 139 02/19/2011   K 3.3* 02/19/2011   CL 104 02/19/2011   CREATININE 0.9 02/19/2011   BUN 16 02/19/2011   CO2 30 02/19/2011   TSH 2.20 07/03/2010   INR 2.0* 09/22/2008   HGBA1C 6.1 02/19/2011         Assessment & Plan:

## 2011-02-25 NOTE — Assessment & Plan Note (Signed)
Watching A1c

## 2011-02-25 NOTE — Assessment & Plan Note (Addendum)
Continue with current prescription therapy as reflected on the Med list. Labs reviewed 

## 2011-02-27 ENCOUNTER — Encounter: Payer: Self-pay | Admitting: Internal Medicine

## 2011-02-27 NOTE — Assessment & Plan Note (Signed)
CTS 2/13 B hands R>L

## 2011-03-10 ENCOUNTER — Ambulatory Visit (INDEPENDENT_AMBULATORY_CARE_PROVIDER_SITE_OTHER): Payer: PRIVATE HEALTH INSURANCE | Admitting: Internal Medicine

## 2011-03-10 ENCOUNTER — Other Ambulatory Visit (INDEPENDENT_AMBULATORY_CARE_PROVIDER_SITE_OTHER): Payer: PRIVATE HEALTH INSURANCE

## 2011-03-10 ENCOUNTER — Encounter: Payer: Self-pay | Admitting: Internal Medicine

## 2011-03-10 VITALS — BP 130/82 | HR 80 | Temp 96.8°F | Resp 16 | Wt 219.0 lb

## 2011-03-10 DIAGNOSIS — R609 Edema, unspecified: Secondary | ICD-10-CM

## 2011-03-10 DIAGNOSIS — L0390 Cellulitis, unspecified: Secondary | ICD-10-CM | POA: Insufficient documentation

## 2011-03-10 DIAGNOSIS — L0291 Cutaneous abscess, unspecified: Secondary | ICD-10-CM

## 2011-03-10 LAB — CBC WITH DIFFERENTIAL/PLATELET
Basophils Relative: 0.4 % (ref 0.0–3.0)
HCT: 35.5 % — ABNORMAL LOW (ref 36.0–46.0)
Hemoglobin: 11.9 g/dL — ABNORMAL LOW (ref 12.0–15.0)
Lymphocytes Relative: 33.8 % (ref 12.0–46.0)
Lymphs Abs: 2.2 10*3/uL (ref 0.7–4.0)
MCHC: 33.6 g/dL (ref 30.0–36.0)
Monocytes Relative: 8.4 % (ref 3.0–12.0)
Neutro Abs: 3.4 10*3/uL (ref 1.4–7.7)
RBC: 3.93 Mil/uL (ref 3.87–5.11)

## 2011-03-10 MED ORDER — MUPIROCIN 2 % EX OINT: TOPICAL_OINTMENT | CUTANEOUS | Status: AC

## 2011-03-10 MED ORDER — AMOXICILLIN-POT CLAVULANATE 875-125 MG PO TABS: 1.0000 | ORAL_TABLET | Freq: Two times a day (BID) | ORAL | Status: AC

## 2011-03-10 MED ORDER — HYDROCODONE-ACETAMINOPHEN 5-325 MG PO TABS: 1.0000 | ORAL_TABLET | Freq: Two times a day (BID) | ORAL | Status: AC | PRN

## 2011-03-10 MED ORDER — AMOXICILLIN-POT CLAVULANATE 875-125 MG PO TABS: 1.0000 | ORAL_TABLET | Freq: Two times a day (BID) | ORAL | Status: DC

## 2011-03-10 MED ORDER — TRAMADOL HCL 50 MG PO TABS: 50.0000 mg | ORAL_TABLET | Freq: Three times a day (TID) | ORAL | Status: DC | PRN

## 2011-03-10 NOTE — Assessment & Plan Note (Signed)
D-dimer 

## 2011-03-10 NOTE — Patient Instructions (Signed)
Elevate leg

## 2011-03-10 NOTE — Progress Notes (Signed)
  Subjective:    Patient ID: Tiffany Gibbs, female    DOB: 1945/01/31, 66 y.o.   MRN: [id number]  HPI  She was in [place] and fell down the steps 1 wk ago - it started to turn red and swell 1-2 d later She took an abx (3 d zpac) from a doctor 3 d ago  Review of Systems  Constitutional: Positive for fever, chills and fatigue. Negative for activity change, appetite change and unexpected weight change.  HENT: Negative for congestion, mouth sores and sinus pressure.   Eyes: Negative for visual disturbance.  Respiratory: Negative for cough and chest tightness.   Gastrointestinal: Negative for nausea and abdominal pain.  Genitourinary: Negative for frequency, difficulty urinating and vaginal pain.  Musculoskeletal: Positive for myalgias. Negative for back pain and gait problem.  Skin: Positive for rash (R leg redness). Negative for pallor.  Neurological: Negative for dizziness, tremors, weakness, numbness and headaches.  Psychiatric/Behavioral: Negative for confusion and sleep disturbance.       Objective:   Physical Exam  Constitutional: She appears well-developed. No distress.  HENT:  Head: Normocephalic.  Right Ear: External ear normal.  Left Ear: External ear normal.  Nose: Nose normal.  Mouth/Throat: Oropharynx is clear and moist.  Eyes: Conjunctivae are normal. Pupils are equal, round, and reactive to light. Right eye exhibits no discharge. Left eye exhibits no discharge.  Neck: Normal range of motion. Neck supple. No JVD present. No tracheal deviation present. No thyromegaly present.  Cardiovascular: Normal rate, regular rhythm and normal heart sounds.   Pulmonary/Chest: No stridor. No respiratory distress. She has no wheezes.  Abdominal: Soft. Bowel sounds are normal. She exhibits no distension and no mass. There is no tenderness. There is no rebound and no guarding.  Musculoskeletal: She exhibits edema (1+ RLE) and tenderness (R anterior leg/shin).  Lymphadenopathy:    She  has no cervical adenopathy.  Neurological: She displays normal reflexes. No cranial nerve deficit. She exhibits normal muscle tone. Coordination normal.  Skin: Rash noted. There is erythema.       L forearm scar x5 cm  Psychiatric: She has a normal mood and affect. Her behavior is normal. Judgment and thought content normal.          Assessment & Plan:

## 2011-03-10 NOTE — Assessment & Plan Note (Signed)
Start Augmentin Mupirocin

## 2011-03-13 ENCOUNTER — Encounter (INDEPENDENT_AMBULATORY_CARE_PROVIDER_SITE_OTHER): Payer: PRIVATE HEALTH INSURANCE

## 2011-03-13 ENCOUNTER — Telehealth: Payer: Self-pay | Admitting: Internal Medicine

## 2011-03-13 ENCOUNTER — Telehealth: Payer: Self-pay | Admitting: *Deleted

## 2011-03-13 DIAGNOSIS — M79609 Pain in unspecified limb: Secondary | ICD-10-CM

## 2011-03-13 DIAGNOSIS — R609 Edema, unspecified: Secondary | ICD-10-CM

## 2011-03-13 DIAGNOSIS — R600 Localized edema: Secondary | ICD-10-CM

## 2011-03-13 NOTE — Telephone Encounter (Signed)
Tiffany Gibbs, please, inform patient that all labs are normal except for one test that may be indicative of a clot. Is she better? Pls sch LE [place] Thx

## 2011-03-13 NOTE — Telephone Encounter (Signed)
Pt informed

## 2011-03-13 NOTE — Telephone Encounter (Signed)
Per Gavin Pound order is in. Pt advised to go to [facility] st[facility] Office now for study.

## 2011-03-13 NOTE — Telephone Encounter (Signed)
Pt states someone called her earlier today and told her she needed to have a US done of left leg because of possible blood clot. Pt wants to know if she has appointment scheduled for today or tomorrow morning

## 2011-03-13 NOTE — Telephone Encounter (Signed)
Order was entered wrong originally.

## 2011-03-24 ENCOUNTER — Encounter: Payer: Self-pay | Admitting: Internal Medicine

## 2011-03-24 ENCOUNTER — Ambulatory Visit (INDEPENDENT_AMBULATORY_CARE_PROVIDER_SITE_OTHER): Payer: PRIVATE HEALTH INSURANCE | Admitting: Internal Medicine

## 2011-03-24 DIAGNOSIS — R609 Edema, unspecified: Secondary | ICD-10-CM

## 2011-03-24 DIAGNOSIS — M7989 Other specified soft tissue disorders: Secondary | ICD-10-CM

## 2011-03-24 DIAGNOSIS — I10 Essential (primary) hypertension: Secondary | ICD-10-CM

## 2011-03-24 DIAGNOSIS — L0390 Cellulitis, unspecified: Secondary | ICD-10-CM

## 2011-03-24 DIAGNOSIS — L0291 Cutaneous abscess, unspecified: Secondary | ICD-10-CM

## 2011-03-24 NOTE — Assessment & Plan Note (Signed)
Continue with current prescription therapy as reflected on the Med list.  

## 2011-03-24 NOTE — Assessment & Plan Note (Signed)
Resolved

## 2011-03-24 NOTE — Progress Notes (Signed)
Patient ID: [name], female   DOB: Feb 12, 1945, 66 y.o.   MRN: [id number]  Subjective:    Patient ID: Tiffany Gibbs, female    DOB: 23-Aug-1945, 66 y.o.   MRN: [id number]  HPI  She was in [place] and fell down the steps 3 wk ago - it started to turn red and swell 1-2 d later She took an abx (3 d zpac) from a doctor 3 d ago. No DVT on [place]. Better after abx  Review of Systems  Constitutional: Positive for fever, chills and fatigue. Negative for activity change, appetite change and unexpected weight change.  HENT: Negative for congestion, mouth sores and sinus pressure.   Eyes: Negative for visual disturbance.  Respiratory: Negative for cough and chest tightness.   Gastrointestinal: Negative for nausea and abdominal pain.  Genitourinary: Negative for frequency, difficulty urinating and vaginal pain.  Musculoskeletal: Positive for myalgias. Negative for back pain and gait problem.  Skin: Positive for rash (R leg redness). Negative for pallor.  Neurological: Negative for dizziness, tremors, weakness, numbness and headaches.  Psychiatric/Behavioral: Negative for confusion and sleep disturbance.       Objective:   Physical Exam  Constitutional: She appears well-developed. No distress.  HENT:  Head: Normocephalic.  Right Ear: External ear normal.  Left Ear: External ear normal.  Nose: Nose normal.  Mouth/Throat: Oropharynx is clear and moist.  Eyes: Conjunctivae are normal. Pupils are equal, round, and reactive to light. Right eye exhibits no discharge. Left eye exhibits no discharge.  Neck: Normal range of motion. Neck supple. No JVD present. No tracheal deviation present. No thyromegaly present.  Cardiovascular: Normal rate, regular rhythm and normal heart sounds.   Pulmonary/Chest: No stridor. No respiratory distress. She has no wheezes.  Abdominal: Soft. Bowel sounds are normal. She exhibits no distension and no mass. There is no tenderness. There is no rebound and no guarding.    Musculoskeletal: She exhibits tenderness (R anterior leg/shin). Edema: trace  RLE   Lymphadenopathy:    She has no cervical adenopathy.  Neurological: She displays normal reflexes. No cranial nerve deficit. She exhibits normal muscle tone. Coordination normal.  Skin: Rash (brownish discol lat shin) noted. No erythema.       L forearm scar x5 cm -- better R dist lat shin with oval tender swelling and hyperpigmentation 11x7 cm  Psychiatric: She has a normal mood and affect. Her behavior is normal. Judgment and thought content normal.   Procedure Note :    Procedure :   Point of care (POC) sonography examination   Indication: R dist shin swelling 11x7 mm   Equipment used: Sonosite M-Turbo with HFL38x/13-6 MHz transducer linear probe. The images were stored in the unit and later transferred in storage.  The patient was placed in a decubitus position.  This study revealed a hypoechoic with inclusions large lesion c/w fluid collection lesion in the R lat dist shin   Impression: Hematoma vs seroma of the R shin          Assessment & Plan:

## 2011-03-24 NOTE — Assessment & Plan Note (Addendum)
R dist shin hematoma or seroma laterally Will aspirate if not much better in 7-10 d Compr sock

## 2011-04-07 ENCOUNTER — Other Ambulatory Visit: Payer: Self-pay | Admitting: Internal Medicine

## 2011-04-07 ENCOUNTER — Ambulatory Visit (INDEPENDENT_AMBULATORY_CARE_PROVIDER_SITE_OTHER): Payer: PRIVATE HEALTH INSURANCE | Admitting: Internal Medicine

## 2011-04-07 ENCOUNTER — Encounter: Payer: Self-pay | Admitting: Internal Medicine

## 2011-04-07 VITALS — BP 120/68 | HR 84 | Temp 98.2°F | Resp 16 | Wt 218.0 lb

## 2011-04-07 DIAGNOSIS — L0291 Cutaneous abscess, unspecified: Secondary | ICD-10-CM

## 2011-04-07 DIAGNOSIS — L0390 Cellulitis, unspecified: Secondary | ICD-10-CM

## 2011-04-07 DIAGNOSIS — R609 Edema, unspecified: Secondary | ICD-10-CM

## 2011-04-07 DIAGNOSIS — M7989 Other specified soft tissue disorders: Secondary | ICD-10-CM

## 2011-04-07 NOTE — Assessment & Plan Note (Addendum)
25-50% better Cont Ibuprofen, heating pad

## 2011-04-07 NOTE — Progress Notes (Signed)
Patient ID: Tiffany Gibbs, female   DOB: [birth year], 66 y.o.   MRN: [id number] Patient ID: Tiffany Gibbs, female   DOB: 27-Aug-1945, 66 y.o.   MRN: [id number]  Subjective:    Patient ID: Tiffany Gibbs, female    DOB: [birth year], 66 y.o.   MRN: [id number]  HPI  She was in [place] and fell down the steps 3 wk ago - it started to turn red and swell 1-2 d later She took an abx (3 d zpac) from a doctor 3 d ago. No DVT on [place]. Better after abx  Review of Systems  Constitutional: Negative for fever, chills, activity change, appetite change, fatigue and unexpected weight change.  HENT: Negative for congestion, mouth sores and sinus pressure.   Eyes: Negative for visual disturbance.  Respiratory: Negative for cough and chest tightness.   Gastrointestinal: Negative for nausea and abdominal pain.  Genitourinary: Negative for frequency, difficulty urinating and vaginal pain.  Musculoskeletal: Positive for myalgias. Negative for back pain and gait problem.  Skin: Positive for rash (R leg redness resolved). Negative for pallor.  Neurological: Negative for dizziness, tremors, weakness, numbness and headaches.  Psychiatric/Behavioral: Negative for confusion and sleep disturbance.       Objective:   Physical Exam  Constitutional: She appears well-developed. No distress.  HENT:  Head: Normocephalic.  Right Ear: External ear normal.  Left Ear: External ear normal.  Nose: Nose normal.  Mouth/Throat: Oropharynx is clear and moist.  Eyes: Conjunctivae are normal. Pupils are equal, round, and reactive to light. Right eye exhibits no discharge. Left eye exhibits no discharge.  Neck: Normal range of motion. Neck supple. No JVD present. No tracheal deviation present. No thyromegaly present.  Cardiovascular: Normal rate, regular rhythm and normal heart sounds.   Pulmonary/Chest: No stridor. No respiratory distress. She has no wheezes.  Abdominal: Soft. Bowel sounds are normal. She exhibits no distension  and no mass. There is no tenderness. There is no rebound and no guarding.  Musculoskeletal: She exhibits tenderness (R anterior leg/shin). Edema: trace  RLE        Local edema on R shin is noticeably smaller and softer  Lymphadenopathy:    She has no cervical adenopathy.  Neurological: She displays normal reflexes. No cranial nerve deficit. She exhibits normal muscle tone. Coordination normal.  Skin: Rash (brownish discol lat shin) noted. No erythema.       L forearm scar x 5 cm R dist lat shin with oval tender with less swelling and hyperpigmentation 9x5 cm  Psychiatric: She has a normal mood and affect. Her behavior is normal. Judgment and thought content normal.   Procedure Note :    Procedure :   Point of care (POC) sonography F/U examination   Indication: R dist shin swelling 8x5 mm   Equipment used: Sonosite M-Turbo with HFL38x/13-6 MHz transducer linear probe. The images were stored in the unit and later transferred in storage.  The patient was placed in a decubitus position.  This study revealed a hypoechoic with inclusions large lesion c/w fluid collection lesion in the R lat dist shin - REDUCED IN SIZE   Impression: Hematoma vs seroma of the R shin has reduced in volume by 25-50%          Assessment & Plan:

## 2011-04-07 NOTE — Assessment & Plan Note (Signed)
resolved 

## 2011-04-19 ENCOUNTER — Other Ambulatory Visit: Payer: Self-pay | Admitting: Internal Medicine

## 2011-06-01 ENCOUNTER — Other Ambulatory Visit (INDEPENDENT_AMBULATORY_CARE_PROVIDER_SITE_OTHER): Payer: PRIVATE HEALTH INSURANCE

## 2011-06-01 DIAGNOSIS — R209 Unspecified disturbances of skin sensation: Secondary | ICD-10-CM

## 2011-06-01 DIAGNOSIS — IMO0001 Reserved for inherently not codable concepts without codable children: Secondary | ICD-10-CM

## 2011-06-01 DIAGNOSIS — R202 Paresthesia of skin: Secondary | ICD-10-CM

## 2011-06-01 DIAGNOSIS — F411 Generalized anxiety disorder: Secondary | ICD-10-CM

## 2011-06-01 DIAGNOSIS — E119 Type 2 diabetes mellitus without complications: Secondary | ICD-10-CM

## 2011-06-01 DIAGNOSIS — R7309 Other abnormal glucose: Secondary | ICD-10-CM

## 2011-06-01 DIAGNOSIS — E785 Hyperlipidemia, unspecified: Secondary | ICD-10-CM

## 2011-06-01 DIAGNOSIS — F329 Major depressive disorder, single episode, unspecified: Secondary | ICD-10-CM

## 2011-06-01 DIAGNOSIS — M797 Fibromyalgia: Secondary | ICD-10-CM

## 2011-06-01 LAB — BASIC METABOLIC PANEL
BUN: 16 mg/dL (ref 6–23)
Chloride: 106 mEq/L (ref 96–112)
Creatinine, Ser: 0.9 mg/dL (ref 0.4–1.2)
Glucose, Bld: 100 mg/dL — ABNORMAL HIGH (ref 70–99)
Potassium: 3.7 mEq/L (ref 3.5–5.1)

## 2011-06-01 LAB — LIPID PANEL
LDL Cholesterol: 113 mg/dL — ABNORMAL HIGH (ref 0–99)
Total CHOL/HDL Ratio: 4
Triglycerides: 140 mg/dL (ref 0.0–149.0)

## 2011-06-03 ENCOUNTER — Ambulatory Visit (INDEPENDENT_AMBULATORY_CARE_PROVIDER_SITE_OTHER): Payer: PRIVATE HEALTH INSURANCE | Admitting: Internal Medicine

## 2011-06-03 ENCOUNTER — Encounter: Payer: Self-pay | Admitting: Internal Medicine

## 2011-06-03 VITALS — BP 130/80 | HR 80 | Temp 98.0°F | Resp 16 | Wt 210.0 lb

## 2011-06-03 DIAGNOSIS — E119 Type 2 diabetes mellitus without complications: Secondary | ICD-10-CM

## 2011-06-03 DIAGNOSIS — M797 Fibromyalgia: Secondary | ICD-10-CM

## 2011-06-03 DIAGNOSIS — E669 Obesity, unspecified: Secondary | ICD-10-CM

## 2011-06-03 DIAGNOSIS — E785 Hyperlipidemia, unspecified: Secondary | ICD-10-CM

## 2011-06-03 DIAGNOSIS — I10 Essential (primary) hypertension: Secondary | ICD-10-CM

## 2011-06-03 DIAGNOSIS — E538 Deficiency of other specified B group vitamins: Secondary | ICD-10-CM | POA: Insufficient documentation

## 2011-06-03 DIAGNOSIS — IMO0001 Reserved for inherently not codable concepts without codable children: Secondary | ICD-10-CM

## 2011-06-03 DIAGNOSIS — F329 Major depressive disorder, single episode, unspecified: Secondary | ICD-10-CM

## 2011-06-03 DIAGNOSIS — R413 Other amnesia: Secondary | ICD-10-CM

## 2011-06-03 MED ORDER — VITAMIN B-12 1000 MCG SL SUBL: 1.0000 | SUBLINGUAL_TABLET | Freq: Every day | SUBLINGUAL | Status: DC

## 2011-06-03 MED ORDER — CIPROFLOXACIN HCL 500 MG PO TABS: 500.0000 mg | ORAL_TABLET | Freq: Two times a day (BID) | ORAL | Status: AC

## 2011-06-03 MED ORDER — CYANOCOBALAMIN 1000 MCG/ML IJ SOLN
1000.0000 ug | Freq: Once | INTRAMUSCULAR | Status: AC
  Administered 2011-06-03: 1000 ug via INTRAMUSCULAR

## 2011-06-03 NOTE — Assessment & Plan Note (Signed)
Continue with current prescription therapy as reflected on the Med list.  

## 2011-06-03 NOTE — Assessment & Plan Note (Signed)
Better with less meds

## 2011-06-03 NOTE — Assessment & Plan Note (Signed)
  On diet  

## 2011-06-03 NOTE — Assessment & Plan Note (Signed)
Diet discussed 

## 2011-06-03 NOTE — Progress Notes (Signed)
Patient ID: Tiffany Gibbs, female   DOB: 06-11-1945, 66 y.o.   MRN: [id number] Patient ID: Tiffany Gibbs, female   DOB: 12/14/1945, 66 y.o.   MRN: [id number]  Subjective:    Patient ID: Tiffany Gibbs, female    DOB: 15-Jun-1945, 66 y.o.   MRN: [id number]  HPI  The patient presents for a follow-up of  chronic hypertension, chronic dyslipidemia, type 2 diabetes controlled with medicines C/o memory issues  BP Readings from Last 3 Encounters:  06/03/11 130/80  04/07/11 120/68  03/24/11 110/70   Wt Readings from Last 3 Encounters:  06/03/11 210 lb (95.255 kg)  04/07/11 218 lb (98.884 kg)  03/24/11 214 lb (97.07 kg)          Review of Systems  Constitutional: Negative for chills, activity change, appetite change, fatigue and unexpected weight change.  HENT: Negative for congestion, mouth sores and sinus pressure.   Eyes: Negative for visual disturbance.  Respiratory: Negative for cough and chest tightness.   Gastrointestinal: Negative for nausea and abdominal pain.  Genitourinary: Negative for frequency, difficulty urinating and vaginal pain.  Musculoskeletal: Negative for back pain and gait problem.  Skin: Negative for pallor and rash.  Neurological: Negative for dizziness, tremors, weakness, numbness and headaches.  Psychiatric/Behavioral: Negative for confusion and sleep disturbance. The patient is not nervous/anxious.        Objective:   Physical Exam  Constitutional: She appears well-developed and well-nourished. No distress.  HENT:  Head: Normocephalic.  Right Ear: External ear normal.  Left Ear: External ear normal.  Nose: Nose normal.  Mouth/Throat: Oropharynx is clear and moist.  Eyes: Conjunctivae are normal. Pupils are equal, round, and reactive to light. Right eye exhibits no discharge. Left eye exhibits no discharge.  Neck: Normal range of motion. Neck supple. No JVD present. No tracheal deviation present. No thyromegaly present.  Cardiovascular: Normal rate,  regular rhythm and normal heart sounds.   Pulmonary/Chest: No stridor. No respiratory distress. She has no wheezes.  Abdominal: Soft. Bowel sounds are normal. She exhibits no distension and no mass. There is no tenderness. There is no rebound and no guarding.  Musculoskeletal: She exhibits no edema and no tenderness.  Lymphadenopathy:    She has no cervical adenopathy.  Neurological: She displays normal reflexes. No cranial nerve deficit. She exhibits normal muscle tone. Coordination normal.  Skin: No rash noted. No erythema.  Psychiatric: She has a normal mood and affect. Her behavior is normal. Judgment and thought content normal.  Tinnel is + B R>L   Lab Results  Component Value Date   WBC 6.4 03/10/2011   HGB 11.9* 03/10/2011   HCT 35.5* 03/10/2011   PLT 253.0 03/10/2011   GLUCOSE 100* 06/01/2011   CHOL 186 06/01/2011   TRIG 140.0 06/01/2011   HDL 45.20 06/01/2011   LDLDIRECT 168.6 07/03/2010   LDLCALC 113* 06/01/2011   ALT 41* 07/03/2010   AST 28 07/03/2010   NA 139 06/01/2011   K 3.7 06/01/2011   CL 106 06/01/2011   CREATININE 0.9 06/01/2011   BUN 16 06/01/2011   CO2 26 06/01/2011   TSH 2.24 06/01/2011   INR 2.0* 09/22/2008   HGBA1C 6.0 06/01/2011         Assessment & Plan:

## 2011-06-03 NOTE — Assessment & Plan Note (Signed)
Start on Rx 

## 2011-06-03 NOTE — Assessment & Plan Note (Signed)
Better Continue with current prescription therapy as reflected on the Med list.  

## 2011-06-09 ENCOUNTER — Other Ambulatory Visit (INDEPENDENT_AMBULATORY_CARE_PROVIDER_SITE_OTHER): Payer: PRIVATE HEALTH INSURANCE

## 2011-06-09 DIAGNOSIS — E538 Deficiency of other specified B group vitamins: Secondary | ICD-10-CM

## 2011-06-09 LAB — VITAMIN B12: Vitamin B-12: 404 pg/mL (ref 211–911)

## 2011-06-10 ENCOUNTER — Ambulatory Visit (INDEPENDENT_AMBULATORY_CARE_PROVIDER_SITE_OTHER): Payer: PRIVATE HEALTH INSURANCE

## 2011-06-10 DIAGNOSIS — E538 Deficiency of other specified B group vitamins: Secondary | ICD-10-CM

## 2011-06-10 MED ORDER — CYANOCOBALAMIN 1000 MCG/ML IJ SOLN
1000.0000 ug | Freq: Once | INTRAMUSCULAR | Status: AC
  Administered 2011-06-10: 1000 ug via INTRAMUSCULAR

## 2011-06-12 ENCOUNTER — Telehealth: Payer: Self-pay | Admitting: Internal Medicine

## 2011-06-12 NOTE — Telephone Encounter (Signed)
Tiffany Gibbs, please, inform patient that her B12 is normal now - cont Rx Thx

## 2011-06-12 NOTE — Telephone Encounter (Signed)
Pt informed

## 2011-07-06 ENCOUNTER — Other Ambulatory Visit: Payer: Self-pay | Admitting: Internal Medicine

## 2011-07-16 ENCOUNTER — Other Ambulatory Visit: Payer: Self-pay | Admitting: Internal Medicine

## 2011-08-10 ENCOUNTER — Other Ambulatory Visit: Payer: Self-pay | Admitting: Internal Medicine

## 2011-08-28 ENCOUNTER — Telehealth: Payer: Self-pay | Admitting: Internal Medicine

## 2011-08-28 DIAGNOSIS — R350 Frequency of micturition: Secondary | ICD-10-CM

## 2011-08-28 NOTE — Telephone Encounter (Signed)
Caller: [name]/Patient; Patient Name: Tiffany Gibbs; PCP: Sonda Primes; Best Callback Phone Number: [phone].  Patient calling today 08/28/11 regarding having frequency and itching.  Onset 08/21/11.  Afebrile.  Emergent symptoms ruled out by Urinary Symptoms Female guidelines with exception of has one or mroe urinary tract symptoms and has not been previously evaluated.  Advised patient she needs to be seen within next 24 hours.  Patient refuses appointment said the only reason she called is because she is coming to office on 8/21 to have her labs drawn for her office visit the following week.  She wants MD to order a urinalysis that day also so he can check her urine at that time.  Again patient refuses appointment and refuses to be seen at this time.  OFFICE PLEASE ASK MD IF HE WILL ADD A URINALYSIS TO PATIENT'S LAB ORDERS FOR 09/02/11 PER PT REQUEST.  YOU CAN REACH PATIENT AT ABOVE CALL BACK NUMBER IF NEEDED.

## 2011-08-29 NOTE — Telephone Encounter (Signed)
Ok to add UA Thx

## 2011-08-31 NOTE — Telephone Encounter (Signed)
UA ordered. Pt informed  

## 2011-09-03 ENCOUNTER — Other Ambulatory Visit

## 2011-09-03 ENCOUNTER — Telehealth: Payer: Self-pay | Admitting: Internal Medicine

## 2011-09-03 ENCOUNTER — Other Ambulatory Visit (INDEPENDENT_AMBULATORY_CARE_PROVIDER_SITE_OTHER): Payer: PRIVATE HEALTH INSURANCE

## 2011-09-03 DIAGNOSIS — E119 Type 2 diabetes mellitus without complications: Secondary | ICD-10-CM

## 2011-09-03 DIAGNOSIS — E785 Hyperlipidemia, unspecified: Secondary | ICD-10-CM

## 2011-09-03 DIAGNOSIS — E538 Deficiency of other specified B group vitamins: Secondary | ICD-10-CM

## 2011-09-03 DIAGNOSIS — R350 Frequency of micturition: Secondary | ICD-10-CM

## 2011-09-03 LAB — BASIC METABOLIC PANEL
BUN: 14 mg/dL (ref 6–23)
CO2: 28 mEq/L (ref 19–32)
Calcium: 9.4 mg/dL (ref 8.4–10.5)
Creatinine, Ser: 0.9 mg/dL (ref 0.4–1.2)
Glucose, Bld: 102 mg/dL — ABNORMAL HIGH (ref 70–99)
Sodium: 138 mEq/L (ref 135–145)

## 2011-09-03 LAB — URINALYSIS, ROUTINE W REFLEX MICROSCOPIC
Leukocytes, UA: NEGATIVE
Nitrite: NEGATIVE
Specific Gravity, Urine: 1.025 (ref 1.000–1.030)
Urobilinogen, UA: 0.2 (ref 0.0–1.0)
pH: 6 (ref 5.0–8.0)

## 2011-09-03 LAB — HEPATIC FUNCTION PANEL
ALT: 48 U/L — ABNORMAL HIGH (ref 0–35)
AST: 42 U/L — ABNORMAL HIGH (ref 0–37)
Bilirubin, Direct: 0.1 mg/dL (ref 0.0–0.3)
Total Protein: 7.6 g/dL (ref 6.0–8.3)

## 2011-09-03 LAB — LIPID PANEL: Cholesterol: 222 mg/dL — ABNORMAL HIGH (ref 0–200)

## 2011-09-03 LAB — LDL CHOLESTEROL, DIRECT: Direct LDL: 145.6 mg/dL

## 2011-09-03 NOTE — Telephone Encounter (Signed)
Needs labs

## 2011-09-09 ENCOUNTER — Encounter: Payer: Self-pay | Admitting: Internal Medicine

## 2011-09-09 ENCOUNTER — Ambulatory Visit (INDEPENDENT_AMBULATORY_CARE_PROVIDER_SITE_OTHER): Payer: PRIVATE HEALTH INSURANCE | Admitting: Internal Medicine

## 2011-09-09 VITALS — BP 108/68 | HR 76 | Temp 98.8°F | Resp 16 | Wt 202.0 lb

## 2011-09-09 DIAGNOSIS — E538 Deficiency of other specified B group vitamins: Secondary | ICD-10-CM

## 2011-09-09 DIAGNOSIS — F329 Major depressive disorder, single episode, unspecified: Secondary | ICD-10-CM

## 2011-09-09 DIAGNOSIS — M797 Fibromyalgia: Secondary | ICD-10-CM

## 2011-09-09 DIAGNOSIS — IMO0001 Reserved for inherently not codable concepts without codable children: Secondary | ICD-10-CM

## 2011-09-09 DIAGNOSIS — I10 Essential (primary) hypertension: Secondary | ICD-10-CM

## 2011-09-09 DIAGNOSIS — F3289 Other specified depressive episodes: Secondary | ICD-10-CM

## 2011-09-09 DIAGNOSIS — E119 Type 2 diabetes mellitus without complications: Secondary | ICD-10-CM

## 2011-09-09 DIAGNOSIS — R7989 Other specified abnormal findings of blood chemistry: Secondary | ICD-10-CM

## 2011-09-09 DIAGNOSIS — R3129 Other microscopic hematuria: Secondary | ICD-10-CM

## 2011-09-09 MED ORDER — NORTRIPTYLINE HCL 10 MG PO CAPS: 10.0000 mg | ORAL_CAPSULE | Freq: Every day | ORAL | Status: DC

## 2011-09-09 NOTE — Assessment & Plan Note (Signed)
No work up needed.

## 2011-09-09 NOTE — Assessment & Plan Note (Signed)
Continue with current prescription therapy as reflected on the Med list.  

## 2011-09-09 NOTE — Assessment & Plan Note (Signed)
Pamelor at hs 

## 2011-09-09 NOTE — Assessment & Plan Note (Signed)
8/13 likely a fatty liver Will recheck

## 2011-09-09 NOTE — Patient Instructions (Addendum)
Wt Readings from Last 3 Encounters:  09/09/11 202 lb (91.627 kg)  06/03/11 210 lb (95.255 kg)  04/07/11 218 lb (98.884 kg)

## 2011-09-09 NOTE — Progress Notes (Signed)
   Subjective:    Patient ID: Tiffany Gibbs, female    DOB: 27-Jun-1945, 66 y.o.   MRN: [id number]  HPI  The patient presents for a follow-up of  chronic hypertension, chronic dyslipidemia, pains, type 2 diabetes controlled with medicines. C/o insomnia F/u on memory issues better off flexeril and Tramadol   BP Readings from Last 3 Encounters:  09/09/11 108/68  06/03/11 130/80  04/07/11 120/68   Wt Readings from Last 3 Encounters:  09/09/11 202 lb (91.627 kg)  06/03/11 210 lb (95.255 kg)  04/07/11 218 lb (98.884 kg)          Review of Systems  Constitutional: Negative for chills, activity change, appetite change, fatigue and unexpected weight change.  HENT: Negative for congestion, mouth sores and sinus pressure.   Eyes: Negative for visual disturbance.  Respiratory: Negative for cough and chest tightness.   Gastrointestinal: Negative for nausea and abdominal pain.  Genitourinary: Negative for frequency, difficulty urinating and vaginal pain.  Musculoskeletal: Negative for back pain and gait problem.  Skin: Negative for pallor and rash.  Neurological: Negative for dizziness, tremors, weakness, numbness and headaches.  Psychiatric/Behavioral: Negative for confusion and disturbed wake/sleep cycle. The patient is not nervous/anxious.        Objective:   Physical Exam  Constitutional: She appears well-developed and well-nourished. No distress.  HENT:  Head: Normocephalic.  Right Ear: External ear normal.  Left Ear: External ear normal.  Nose: Nose normal.  Mouth/Throat: Oropharynx is clear and moist.  Eyes: Conjunctivae are normal. Pupils are equal, round, and reactive to light. Right eye exhibits no discharge. Left eye exhibits no discharge.  Neck: Normal range of motion. Neck supple. No JVD present. No tracheal deviation present. No thyromegaly present.  Cardiovascular: Normal rate, regular rhythm and normal heart sounds.   Pulmonary/Chest: No stridor. No respiratory  distress. She has no wheezes.  Abdominal: Soft. Bowel sounds are normal. She exhibits no distension and no mass. There is no tenderness. There is no rebound and no guarding.  Musculoskeletal: She exhibits no edema and no tenderness.  Lymphadenopathy:    She has no cervical adenopathy.  Neurological: She displays normal reflexes. No cranial nerve deficit. She exhibits normal muscle tone. Coordination normal.  Skin: No rash noted. No erythema.  Psychiatric: She has a normal mood and affect. Her behavior is normal. Judgment and thought content normal.  Tinnel is + B R>L   Lab Results  Component Value Date   WBC 6.4 03/10/2011   HGB 11.9* 03/10/2011   HCT 35.5* 03/10/2011   PLT 253.0 03/10/2011   GLUCOSE 102* 09/03/2011   CHOL 222* 09/03/2011   TRIG 107.0 09/03/2011   HDL 59.60 09/03/2011   LDLDIRECT 145.6 09/03/2011   LDLCALC 113* 06/01/2011   ALT 48* 09/03/2011   AST 42* 09/03/2011   NA 138 09/03/2011   K 3.8 09/03/2011   CL 102 09/03/2011   CREATININE 0.9 09/03/2011   BUN 14 09/03/2011   CO2 28 09/03/2011   TSH 2.24 06/01/2011   INR 2.0* 09/22/2008   HGBA1C 5.7 09/03/2011         Assessment & Plan:

## 2011-09-09 NOTE — Assessment & Plan Note (Signed)
Better  

## 2011-09-21 ENCOUNTER — Other Ambulatory Visit: Payer: Self-pay | Admitting: Internal Medicine

## 2011-09-21 ENCOUNTER — Telehealth: Payer: Self-pay | Admitting: Internal Medicine

## 2011-09-21 NOTE — Telephone Encounter (Signed)
Caller: Bess/Patient; Patient Name: Tiffany Gibbs; PCP: Sonda Primes (Adults only); Best Callback Phone Number: [phone].  Patient is having insomnia and was given Nortryptyline (Pamelor) 10 mg take 1-2 caps at HS and she has been taking 3-4 and still not working.  Started this  09/16/11.   She is not able to go to sleep until about 0600 and then gets up at noon.   Triaged Sleep Disorders and needs to be seen in 24 hours for sleep deprivation AND Irritable, agitated or multiple episodes of apnea at night.  She states she irritabel especially at night.   Informed patient she needs an appointment in 24 hours and she states she is not coming back in.  I told her there are several options for sleep medications that she would  need to discuss with the Dr. and she says she trusts his judgement.   She states she wants something "real powerful" so she can get some sleep.   Please call her when/if something can be called in.

## 2011-09-22 MED ORDER — HYDROXYZINE HCL 10 MG PO TABS: ORAL_TABLET | ORAL | Status: DC

## 2011-09-22 NOTE — Telephone Encounter (Signed)
Called the patient informed rx sent in to pharmacy and informed of Md instrutions.

## 2011-09-22 NOTE — Telephone Encounter (Signed)
Called left message to call back 

## 2011-09-22 NOTE — Telephone Encounter (Signed)
I unfortunately have to decline prescription of a controlled substance for this pt whom I have not evaluated, such as ambien, especially since she has shown the willingness to take more than the dose prescribed on her own  I would try hydroxyzine 25-50 mg qhs which may help with anxiety and sleep as well - done per emr

## 2011-11-25 ENCOUNTER — Telehealth: Payer: Self-pay | Admitting: Pulmonary Disease

## 2011-11-25 NOTE — Telephone Encounter (Signed)
Spoke with pt and she states that she is sending card for download to [facility] to have info for Dr Shelle Iron at [facility] in December with Dr Shelle Iron.

## 2011-12-03 ENCOUNTER — Telehealth: Payer: Self-pay | Admitting: Internal Medicine

## 2011-12-03 NOTE — Telephone Encounter (Signed)
REQUESTING HIGH POWERED BENEDRYL RX CALLED INTO [facility] IN [place].  DOESN'T LIKE THE OTC STRENGTH.

## 2011-12-04 ENCOUNTER — Ambulatory Visit: Payer: PRIVATE HEALTH INSURANCE | Admitting: Internal Medicine

## 2011-12-04 MED ORDER — DIPHENHYDRAMINE HCL 50 MG PO CAPS: ORAL_CAPSULE | ORAL | Status: DC

## 2011-12-04 NOTE — Telephone Encounter (Signed)
Done

## 2011-12-04 NOTE — Telephone Encounter (Signed)
  OK 50 mg Thx

## 2011-12-21 ENCOUNTER — Telehealth: Payer: Self-pay | Admitting: Pulmonary Disease

## 2011-12-21 NOTE — Telephone Encounter (Addendum)
Lawson Fiscal, have you seen a copy of the pt's CPAP download? Please advise, thanks!

## 2011-12-22 ENCOUNTER — Other Ambulatory Visit: Payer: Self-pay | Admitting: *Deleted

## 2011-12-22 ENCOUNTER — Other Ambulatory Visit (INDEPENDENT_AMBULATORY_CARE_PROVIDER_SITE_OTHER): Payer: PRIVATE HEALTH INSURANCE

## 2011-12-22 DIAGNOSIS — M797 Fibromyalgia: Secondary | ICD-10-CM

## 2011-12-22 DIAGNOSIS — F329 Major depressive disorder, single episode, unspecified: Secondary | ICD-10-CM

## 2011-12-22 DIAGNOSIS — E119 Type 2 diabetes mellitus without complications: Secondary | ICD-10-CM

## 2011-12-22 DIAGNOSIS — E538 Deficiency of other specified B group vitamins: Secondary | ICD-10-CM

## 2011-12-22 DIAGNOSIS — R7989 Other specified abnormal findings of blood chemistry: Secondary | ICD-10-CM

## 2011-12-22 DIAGNOSIS — R3129 Other microscopic hematuria: Secondary | ICD-10-CM

## 2011-12-22 DIAGNOSIS — IMO0001 Reserved for inherently not codable concepts without codable children: Secondary | ICD-10-CM

## 2011-12-22 LAB — HEMOGLOBIN A1C: Hgb A1c MFr Bld: 6 % (ref 4.6–6.5)

## 2011-12-22 LAB — HEPATIC FUNCTION PANEL
ALT: 24 U/L (ref 0–35)
AST: 21 U/L (ref 0–37)
Alkaline Phosphatase: 51 U/L (ref 39–117)
Bilirubin, Direct: 0.1 mg/dL (ref 0.0–0.3)
Total Bilirubin: 0.7 mg/dL (ref 0.3–1.2)
Total Protein: 7.2 g/dL (ref 6.0–8.3)

## 2011-12-22 LAB — BASIC METABOLIC PANEL
CO2: 28 mEq/L (ref 19–32)
Chloride: 104 mEq/L (ref 96–112)
Creatinine, Ser: 1.1 mg/dL (ref 0.4–1.2)
Potassium: 3.9 mEq/L (ref 3.5–5.1)

## 2011-12-22 LAB — LIPID PANEL
LDL Cholesterol: 114 mg/dL — ABNORMAL HIGH (ref 0–99)
Total CHOL/HDL Ratio: 4
Triglycerides: 117 mg/dL (ref 0.0–149.0)

## 2011-12-22 NOTE — Telephone Encounter (Signed)
I spoke with [employer] and the pt has sent her card in so download can be obtained. [employer] will get this information and then fax to [facility] in triage. Will await fax.  Pt is scheduled for follow-up on 12/30/11 w/KC.

## 2011-12-22 NOTE — Telephone Encounter (Signed)
Download received from [employer] and pt is aware. Pt will discuss with KC at her yearly OV on 12/30/11. Download placed in file up front for upcoming appts.

## 2011-12-30 ENCOUNTER — Encounter: Payer: Self-pay | Admitting: Internal Medicine

## 2011-12-30 ENCOUNTER — Encounter: Payer: Self-pay | Admitting: Pulmonary Disease

## 2011-12-30 ENCOUNTER — Ambulatory Visit (INDEPENDENT_AMBULATORY_CARE_PROVIDER_SITE_OTHER): Payer: PRIVATE HEALTH INSURANCE | Admitting: Pulmonary Disease

## 2011-12-30 ENCOUNTER — Ambulatory Visit (INDEPENDENT_AMBULATORY_CARE_PROVIDER_SITE_OTHER): Payer: PRIVATE HEALTH INSURANCE | Admitting: Internal Medicine

## 2011-12-30 VITALS — BP 110/60 | HR 75 | Temp 98.7°F | Ht 63.5 in | Wt 204.4 lb

## 2011-12-30 VITALS — BP 116/60 | HR 74 | Temp 98.1°F | Resp 16 | Wt 204.2 lb

## 2011-12-30 DIAGNOSIS — IMO0001 Reserved for inherently not codable concepts without codable children: Secondary | ICD-10-CM

## 2011-12-30 DIAGNOSIS — E538 Deficiency of other specified B group vitamins: Secondary | ICD-10-CM

## 2011-12-30 DIAGNOSIS — E119 Type 2 diabetes mellitus without complications: Secondary | ICD-10-CM

## 2011-12-30 DIAGNOSIS — I10 Essential (primary) hypertension: Secondary | ICD-10-CM

## 2011-12-30 DIAGNOSIS — F329 Major depressive disorder, single episode, unspecified: Secondary | ICD-10-CM

## 2011-12-30 DIAGNOSIS — R7989 Other specified abnormal findings of blood chemistry: Secondary | ICD-10-CM

## 2011-12-30 DIAGNOSIS — M797 Fibromyalgia: Secondary | ICD-10-CM

## 2011-12-30 DIAGNOSIS — G4733 Obstructive sleep apnea (adult) (pediatric): Secondary | ICD-10-CM

## 2011-12-30 DIAGNOSIS — E669 Obesity, unspecified: Secondary | ICD-10-CM

## 2011-12-30 DIAGNOSIS — E785 Hyperlipidemia, unspecified: Secondary | ICD-10-CM

## 2011-12-30 DIAGNOSIS — Z23 Encounter for immunization: Secondary | ICD-10-CM

## 2011-12-30 MED ORDER — CLONAZEPAM 1 MG PO TABS: 1.0000 mg | ORAL_TABLET | Freq: Every evening | ORAL | Status: DC | PRN

## 2011-12-30 MED ORDER — PAROXETINE HCL 20 MG PO TABS: 20.0000 mg | ORAL_TABLET | ORAL | Status: DC

## 2011-12-30 NOTE — Progress Notes (Signed)
   Subjective:    Patient ID: Tiffany Gibbs, female    DOB: [birth year], 66 y.o.   MRN: [id number]  HPI  C/o insomnia - bad The patient presents for a follow-up of  chronic hypertension, chronic dyslipidemia, pains, type 2 diabetes controlled with medicines. C/o insomnia F/u on memory issues better off flexeril and Tramadol   BP Readings from Last 3 Encounters:  12/30/11 116/60  09/09/11 108/68  06/03/11 130/80   Wt Readings from Last 3 Encounters:  12/30/11 204 lb 4 oz (92.647 kg)  09/09/11 202 lb (91.627 kg)  06/03/11 210 lb (95.255 kg)          Review of Systems  Constitutional: Negative for chills, activity change, appetite change, fatigue and unexpected weight change.  HENT: Negative for congestion, mouth sores and sinus pressure.   Eyes: Negative for visual disturbance.  Respiratory: Negative for cough and chest tightness.   Gastrointestinal: Negative for nausea and abdominal pain.  Genitourinary: Negative for frequency, difficulty urinating and vaginal pain.  Musculoskeletal: Negative for back pain and gait problem.  Skin: Negative for pallor and rash.  Neurological: Negative for dizziness, tremors, weakness, numbness and headaches.  Psychiatric/Behavioral: Negative for confusion and sleep disturbance. The patient is not nervous/anxious.        Objective:   Physical Exam  Constitutional: She appears well-developed and well-nourished. No distress.  HENT:  Head: Normocephalic.  Right Ear: External ear normal.  Left Ear: External ear normal.  Nose: Nose normal.  Mouth/Throat: Oropharynx is clear and moist.  Eyes: Conjunctivae normal are normal. Pupils are equal, round, and reactive to light. Right eye exhibits no discharge. Left eye exhibits no discharge.  Neck: Normal range of motion. Neck supple. No JVD present. No tracheal deviation present. No thyromegaly present.  Cardiovascular: Normal rate, regular rhythm and normal heart sounds.   Pulmonary/Chest: No  stridor. No respiratory distress. She has no wheezes.  Abdominal: Soft. Bowel sounds are normal. She exhibits no distension and no mass. There is no tenderness. There is no rebound and no guarding.  Musculoskeletal: She exhibits no edema and no tenderness.  Lymphadenopathy:    She has no cervical adenopathy.  Neurological: She displays normal reflexes. No cranial nerve deficit. She exhibits normal muscle tone. Coordination normal.  Skin: No rash noted. No erythema.  Psychiatric: She has a normal mood and affect. Her behavior is normal. Judgment and thought content normal.  Tinnel is + B R>L   Lab Results  Component Value Date   WBC 6.4 03/10/2011   HGB 11.9* 03/10/2011   HCT 35.5* 03/10/2011   PLT 253.0 03/10/2011   GLUCOSE 110* 12/22/2011   CHOL 186 12/22/2011   TRIG 117.0 12/22/2011   HDL 48.90 12/22/2011   LDLDIRECT 145.6 09/03/2011   LDLCALC 114* 12/22/2011   ALT 24 12/22/2011   AST 21 12/22/2011   NA 139 12/22/2011   K 3.9 12/22/2011   CL 104 12/22/2011   CREATININE 1.1 12/22/2011   BUN 14 12/22/2011   CO2 28 12/22/2011   TSH 2.24 06/01/2011   INR 2.0* 09/22/2008   HGBA1C 6.0 12/22/2011         Assessment & Plan:

## 2011-12-30 NOTE — Progress Notes (Signed)
  Subjective:    Patient ID: Tiffany Gibbs, female    DOB: Oct 14, 1945, 66 y.o.   MRN: [id number]  HPI Patient comes in today for followup of her obstructive sleep apnea.  She is wearing CPAP compliantly by her download, and her AHI appears to be well controlled on a pressure of 11 cm.  However, the patient has lost 15 pounds since the last visit, and most recently has had issues with CPAP intolerance.  She has lost 15 pounds, and is unsure if her pressure may be too high.  She is also having issues with insomnia, and this may be part of the problem.   Review of Systems  Constitutional: Negative for fever and unexpected weight change.  HENT: Negative for ear pain, nosebleeds, congestion, sore throat, rhinorrhea, sneezing, trouble swallowing, dental problem, postnasal drip and sinus pressure.   Eyes: Negative for redness and itching.  Respiratory: Negative for cough, chest tightness, shortness of breath and wheezing.   Cardiovascular: Negative for palpitations and leg swelling.  Gastrointestinal: Negative for nausea and vomiting.  Genitourinary: Negative for dysuria.  Musculoskeletal: Negative for joint swelling.  Skin: Negative for rash.  Neurological: Negative for headaches.  Hematological: Does not bruise/bleed easily.  Psychiatric/Behavioral: Positive for sleep disturbance. Negative for dysphoric mood. The patient is not nervous/anxious.        Objective:   Physical Exam Overweight female in no acute distress Nose without purulence or discharge noted No skin breakdown or pressure necrosis from the CPAP mask Neck without lymphadenopathy or thyromegaly Lower extremities with mild edema, cyanosis Alert and oriented, moves all 4 extremities.       Assessment & Plan:

## 2011-12-30 NOTE — Assessment & Plan Note (Signed)
On CPAP. ?

## 2011-12-30 NOTE — Assessment & Plan Note (Signed)
Resolved

## 2011-12-30 NOTE — Assessment & Plan Note (Signed)
The patient has been wearing CPAP compliantly by her download, and appears to have excellent control of her sleep apnea at a pressure of 11.  However, she has lost weight since the last visit, and now is having issues with CPAP tolerance.  She denies any issues with her mask fit or leaking, but I wonder if she will either do better at a lower CPAP pressure or on the automatic setting.  I would like to put her back on the auto setting for the next few weeks and see how she does.

## 2011-12-30 NOTE — Assessment & Plan Note (Signed)
Doing well Continue with current prescription therapy as reflected on the Med list.  

## 2011-12-30 NOTE — Assessment & Plan Note (Signed)
Since 2002 S/p rhematol eval - negative 8/13 she stopped tramadol and Flexeril 2012 resolved pot-retirement

## 2011-12-30 NOTE — Assessment & Plan Note (Signed)
Continue with current prescription therapy as reflected on the Med list.  

## 2011-12-30 NOTE — Assessment & Plan Note (Signed)
Will change to Paxil

## 2011-12-30 NOTE — Assessment & Plan Note (Signed)
Better  

## 2011-12-30 NOTE — Patient Instructions (Addendum)
Will put your machine back on auto and see if this works better for you.  Let us know in 2 weeks what your think. Continue working on weight loss Try behavioral therapies we discussed for your sleep onset issues.

## 2011-12-30 NOTE — Assessment & Plan Note (Signed)
Wt Readings from Last 3 Encounters:  12/30/11 204 lb 4 oz (92.647 kg)  09/09/11 202 lb (91.627 kg)  06/03/11 210 lb (95.255 kg)

## 2011-12-30 NOTE — Assessment & Plan Note (Signed)
Chronic  12/13 better on red rice yeast

## 2012-01-05 ENCOUNTER — Encounter: Payer: Self-pay | Admitting: Internal Medicine

## 2012-01-08 ENCOUNTER — Telehealth: Payer: Self-pay | Admitting: Pulmonary Disease

## 2012-01-08 NOTE — Telephone Encounter (Signed)
Called and spoke with [employer]. [facility] did receive order for pressure change on cpap. [employer] is backed up on orders, due to the holidays. RT will contact patient today to schedule pressure change. [name]

## 2012-01-08 NOTE — Telephone Encounter (Signed)
Called and spoke with patient and [employer] did contact the patient to arrange for pressure change on her cpap. Advised patient that if she needed [place] to contact us. [name]

## 2012-02-04 ENCOUNTER — Telehealth: Payer: Self-pay | Admitting: Internal Medicine

## 2012-02-04 NOTE — Telephone Encounter (Signed)
Nurse attempted to call patient.  Line was busy.

## 2012-02-05 ENCOUNTER — Ambulatory Visit (INDEPENDENT_AMBULATORY_CARE_PROVIDER_SITE_OTHER): Payer: PRIVATE HEALTH INSURANCE | Admitting: Internal Medicine

## 2012-02-05 ENCOUNTER — Encounter: Payer: Self-pay | Admitting: Internal Medicine

## 2012-02-05 VITALS — BP 90/58 | HR 72 | Temp 97.1°F | Resp 16 | Wt 209.0 lb

## 2012-02-05 DIAGNOSIS — R197 Diarrhea, unspecified: Secondary | ICD-10-CM

## 2012-02-05 MED ORDER — DIPHENOXYLATE-ATROPINE 2.5-0.025 MG PO TABS: 1.0000 | ORAL_TABLET | Freq: Four times a day (QID) | ORAL | Status: DC | PRN

## 2012-02-05 NOTE — Patient Instructions (Signed)
Probiotic 1 a day Imodium as needed

## 2012-02-05 NOTE — Assessment & Plan Note (Signed)
Stool tests Lomotil prn RTC if not well

## 2012-02-05 NOTE — Progress Notes (Signed)
Subjective:     Diarrhea  This is a new problem. The current episode started 1 to 4 weeks ago (3 wks). The problem occurs 2 to 4 times per day. The problem has been unchanged. The stool consistency is described as watery (soft). The patient states that diarrhea does not awaken her from sleep. Pertinent negatives include no abdominal pain, chills, coughing, fever, headaches or vomiting. Nothing aggravates the symptoms. She has tried change of diet for the symptoms. The treatment provided mild relief. There is no history of inflammatory bowel disease or a recent abdominal surgery.    F/u  insomnia - better. No new meds, abx The patient presents for a follow-up of  chronic hypertension, chronic dyslipidemia, pains, type 2 diabetes controlled with medicines. C/o insomnia F/u on memory issues better off flexeril and Tramadol   BP Readings from Last 3 Encounters:  02/05/12 90/58  12/30/11 110/60  12/30/11 116/60   Wt Readings from Last 3 Encounters:  02/05/12 209 lb (94.802 kg)  12/30/11 204 lb 6.4 oz (92.715 kg)  12/30/11 204 lb 4 oz (92.647 kg)          Review of Systems  Constitutional: Negative for fever, chills, activity change, appetite change, fatigue and unexpected weight change.  HENT: Negative for congestion, mouth sores and sinus pressure.   Eyes: Negative for visual disturbance.  Respiratory: Negative for cough and chest tightness.   Gastrointestinal: Positive for diarrhea. Negative for nausea, vomiting and abdominal pain.  Genitourinary: Negative for frequency, difficulty urinating and vaginal pain.  Musculoskeletal: Negative for back pain and gait problem.  Skin: Negative for pallor and rash.  Neurological: Negative for dizziness, tremors, weakness, numbness and headaches.  Psychiatric/Behavioral: Negative for confusion and sleep disturbance. The patient is not nervous/anxious.        Objective:   Physical Exam  Constitutional: She appears well-developed and  well-nourished. No distress.  HENT:  Head: Normocephalic.  Right Ear: External ear normal.  Left Ear: External ear normal.  Nose: Nose normal.  Mouth/Throat: Oropharynx is clear and moist.  Eyes: Conjunctivae normal are normal. Pupils are equal, round, and reactive to light. Right eye exhibits no discharge. Left eye exhibits no discharge.  Neck: Normal range of motion. Neck supple. No JVD present. No tracheal deviation present. No thyromegaly present.  Cardiovascular: Normal rate, regular rhythm and normal heart sounds.   Pulmonary/Chest: No stridor. No respiratory distress. She has no wheezes.  Abdominal: Soft. Bowel sounds are normal. She exhibits no distension and no mass. There is no tenderness. There is no rebound and no guarding.  Musculoskeletal: She exhibits no edema and no tenderness.  Lymphadenopathy:    She has no cervical adenopathy.  Neurological: She displays normal reflexes. No cranial nerve deficit. She exhibits normal muscle tone. Coordination normal.  Skin: No rash noted. No erythema.  Psychiatric: She has a normal mood and affect. Her behavior is normal. Judgment and thought content normal.  Tinnel is + B R>L   Lab Results  Component Value Date   WBC 6.4 03/10/2011   HGB 11.9* 03/10/2011   HCT 35.5* 03/10/2011   PLT 253.0 03/10/2011   GLUCOSE 110* 12/22/2011   CHOL 186 12/22/2011   TRIG 117.0 12/22/2011   HDL 48.90 12/22/2011   LDLDIRECT 145.6 09/03/2011   LDLCALC 114* 12/22/2011   ALT 24 12/22/2011   AST 21 12/22/2011   NA 139 12/22/2011   K 3.9 12/22/2011   CL 104 12/22/2011   CREATININE 1.1 12/22/2011   BUN  14 12/22/2011   CO2 28 12/22/2011   TSH 2.24 06/01/2011   INR 2.0* 09/22/2008   HGBA1C 6.0 12/22/2011         Assessment & Plan:

## 2012-02-08 ENCOUNTER — Other Ambulatory Visit: Payer: PRIVATE HEALTH INSURANCE

## 2012-02-08 DIAGNOSIS — R197 Diarrhea, unspecified: Secondary | ICD-10-CM

## 2012-02-09 LAB — GIARDIA/CRYPTOSPORIDIUM (EIA)
Cryptosporidium Screen (EIA): NEGATIVE
Giardia Screen (EIA): NEGATIVE

## 2012-02-12 ENCOUNTER — Telehealth: Payer: Self-pay | Admitting: Internal Medicine

## 2012-02-12 NOTE — Telephone Encounter (Signed)
Patient dropped off a stool specimen early this week and she is calling to see if the results are available

## 2012-02-12 NOTE — Telephone Encounter (Signed)
Pt informed

## 2012-02-12 NOTE — Telephone Encounter (Signed)
Patient dropped off a stool specimen early this week and she is calling to see if the results are available °

## 2012-03-21 ENCOUNTER — Telehealth: Payer: Self-pay | Admitting: Internal Medicine

## 2012-03-21 DIAGNOSIS — E119 Type 2 diabetes mellitus without complications: Secondary | ICD-10-CM

## 2012-03-21 DIAGNOSIS — E785 Hyperlipidemia, unspecified: Secondary | ICD-10-CM

## 2012-03-21 DIAGNOSIS — I10 Essential (primary) hypertension: Secondary | ICD-10-CM

## 2012-03-21 NOTE — Telephone Encounter (Signed)
OK BMET, A1c, lipids Thx 

## 2012-03-21 NOTE — Telephone Encounter (Signed)
Pt req lab order a week prior to pt appt which is 03/31/12. Please call pt if this is ok.

## 2012-03-21 NOTE — Telephone Encounter (Signed)
Please order labs.  Thanks.  

## 2012-03-22 NOTE — Telephone Encounter (Signed)
Done

## 2012-03-23 ENCOUNTER — Other Ambulatory Visit (INDEPENDENT_AMBULATORY_CARE_PROVIDER_SITE_OTHER): Payer: PRIVATE HEALTH INSURANCE

## 2012-03-23 DIAGNOSIS — E785 Hyperlipidemia, unspecified: Secondary | ICD-10-CM

## 2012-03-23 DIAGNOSIS — E119 Type 2 diabetes mellitus without complications: Secondary | ICD-10-CM

## 2012-03-23 DIAGNOSIS — I10 Essential (primary) hypertension: Secondary | ICD-10-CM

## 2012-03-23 LAB — LIPID PANEL
Cholesterol: 206 mg/dL — ABNORMAL HIGH (ref 0–200)
HDL: 46.1 mg/dL (ref >39.00)
Total CHOL/HDL Ratio: 4
Triglycerides: 85 mg/dL (ref 0.0–149.0)
VLDL: 17 mg/dL (ref 0.0–40.0)

## 2012-03-23 LAB — BASIC METABOLIC PANEL
CO2: 27 mEq/L (ref 19–32)
Chloride: 103 mEq/L (ref 96–112)
Potassium: 3.6 mEq/L (ref 3.5–5.1)

## 2012-03-23 LAB — HEMOGLOBIN A1C: Hgb A1c MFr Bld: 5.9 % (ref 4.6–6.5)

## 2012-03-31 ENCOUNTER — Ambulatory Visit (INDEPENDENT_AMBULATORY_CARE_PROVIDER_SITE_OTHER): Payer: PRIVATE HEALTH INSURANCE | Admitting: Internal Medicine

## 2012-03-31 ENCOUNTER — Encounter: Payer: Self-pay | Admitting: Internal Medicine

## 2012-03-31 VITALS — BP 114/74 | HR 82 | Temp 98.4°F | Wt 202.1 lb

## 2012-03-31 DIAGNOSIS — R197 Diarrhea, unspecified: Secondary | ICD-10-CM

## 2012-03-31 DIAGNOSIS — E538 Deficiency of other specified B group vitamins: Secondary | ICD-10-CM

## 2012-03-31 DIAGNOSIS — R05 Cough: Secondary | ICD-10-CM | POA: Insufficient documentation

## 2012-03-31 DIAGNOSIS — E119 Type 2 diabetes mellitus without complications: Secondary | ICD-10-CM

## 2012-03-31 DIAGNOSIS — R059 Cough, unspecified: Secondary | ICD-10-CM

## 2012-03-31 MED ORDER — LOSARTAN POTASSIUM 100 MG PO TABS: 100.0000 mg | ORAL_TABLET | Freq: Every day | ORAL | Status: DC

## 2012-03-31 MED ORDER — PROMETHAZINE-CODEINE 6.25-10 MG/5ML PO SYRP: 5.0000 mL | ORAL_SOLUTION | ORAL | Status: DC | PRN

## 2012-03-31 NOTE — Progress Notes (Signed)
Patient ID: Tiffany Gibbs, female   DOB: 1945/06/23, 67 y.o.   MRN: [id number]   Subjective:     Cough This is a new problem. The current episode started 1 to 4 weeks ago. The cough is non-productive. Pertinent negatives include no chills, fever, headaches or rash. There is no history of asthma.  Diarrhea  This is a chronic problem. The current episode started more than 1 month ago (3 mo). The problem occurs 2 to 4 times per day. The problem has been unchanged. The stool consistency is described as watery (soft). The patient states that diarrhea does not awaken her from sleep. Pertinent negatives include no abdominal pain, chills, coughing, fever, headaches or vomiting. Nothing aggravates the symptoms. She has tried change of diet for the symptoms. The treatment provided mild relief. There is no history of inflammatory bowel disease or a recent abdominal surgery.    F/u  insomnia - better. No new meds, abx The patient presents for a follow-up of  chronic hypertension, chronic dyslipidemia, pains, type 2 diabetes controlled with medicines. C/o insomnia F/u on memory issues better off flexeril and Tramadol   BP Readings from Last 3 Encounters:  03/31/12 114/74  02/05/12 90/58  12/30/11 110/60   Wt Readings from Last 3 Encounters:  03/31/12 202 lb 1.9 oz (91.681 kg)  02/05/12 209 lb (94.802 kg)  12/30/11 204 lb 6.4 oz (92.715 kg)          Review of Systems  Constitutional: Negative for fever, chills, activity change, appetite change, fatigue and unexpected weight change.  HENT: Negative for congestion, mouth sores and sinus pressure.   Eyes: Negative for visual disturbance.  Respiratory: Negative for cough and chest tightness.   Gastrointestinal: Positive for diarrhea. Negative for nausea, vomiting and abdominal pain.  Genitourinary: Negative for frequency, difficulty urinating and vaginal pain.  Musculoskeletal: Negative for back pain and gait problem.  Skin: Negative for  pallor and rash.  Neurological: Negative for dizziness, tremors, weakness, numbness and headaches.  Psychiatric/Behavioral: Negative for confusion and sleep disturbance. The patient is not nervous/anxious.        Objective:   Physical Exam  Constitutional: She appears well-developed and well-nourished. No distress.  HENT:  Head: Normocephalic.  Right Ear: External ear normal.  Left Ear: External ear normal.  Nose: Nose normal.  Mouth/Throat: Oropharynx is clear and moist.  Eyes: Conjunctivae are normal. Pupils are equal, round, and reactive to light. Right eye exhibits no discharge. Left eye exhibits no discharge.  Neck: Normal range of motion. Neck supple. No JVD present. No tracheal deviation present. No thyromegaly present.  Cardiovascular: Normal rate, regular rhythm and normal heart sounds.   Pulmonary/Chest: No stridor. No respiratory distress. She has no wheezes.  Abdominal: Soft. Bowel sounds are normal. She exhibits no distension and no mass. There is no tenderness. There is no rebound and no guarding.  Musculoskeletal: She exhibits no edema and no tenderness.  Lymphadenopathy:    She has no cervical adenopathy.  Neurological: She displays normal reflexes. No cranial nerve deficit. She exhibits normal muscle tone. Coordination normal.  Skin: No rash noted. No erythema.  Psychiatric: She has a normal mood and affect. Her behavior is normal. Judgment and thought content normal.  Tinnel is + B R>L   Lab Results  Component Value Date   WBC 6.4 03/10/2011   HGB 11.9* 03/10/2011   HCT 35.5* 03/10/2011   PLT 253.0 03/10/2011   GLUCOSE 112* 03/23/2012   CHOL 206* 03/23/2012  TRIG 85.0 03/23/2012   HDL 46.10 03/23/2012   LDLDIRECT 146.1 03/23/2012   LDLCALC 114* 12/22/2011   ALT 24 12/22/2011   AST 21 12/22/2011   NA 139 03/23/2012   K 3.6 03/23/2012   CL 103 03/23/2012   CREATININE 1.1 03/23/2012   BUN 21 03/23/2012   CO2 27 03/23/2012   TSH 2.24 06/01/2011   INR 2.0* 09/22/2008    HGBA1C 5.9 03/23/2012         Assessment & Plan:

## 2012-03-31 NOTE — Assessment & Plan Note (Signed)
3/14 post-URI -- ACE related. D/c enalapril

## 2012-03-31 NOTE — Patient Instructions (Signed)
Stop Enalapril - start Losartan Hold Metformin - see if diarrhea is better

## 2012-03-31 NOTE — Assessment & Plan Note (Signed)
Continue with current prescription therapy as reflected on the Med list.  

## 2012-03-31 NOTE — Assessment & Plan Note (Signed)
Hold Metformin x 1-2 wks GI consult

## 2012-04-03 ENCOUNTER — Encounter: Payer: Self-pay | Admitting: Internal Medicine

## 2012-04-13 ENCOUNTER — Other Ambulatory Visit: Payer: Self-pay | Admitting: *Deleted

## 2012-04-13 MED ORDER — PAROXETINE HCL 20 MG PO TABS: 20.0000 mg | ORAL_TABLET | ORAL | Status: DC

## 2012-05-26 ENCOUNTER — Ambulatory Visit (INDEPENDENT_AMBULATORY_CARE_PROVIDER_SITE_OTHER): Payer: PRIVATE HEALTH INSURANCE | Admitting: Internal Medicine

## 2012-05-26 ENCOUNTER — Encounter: Payer: Self-pay | Admitting: Internal Medicine

## 2012-05-26 VITALS — BP 100/70 | HR 80 | Temp 98.3°F | Resp 16 | Wt 205.0 lb

## 2012-05-26 DIAGNOSIS — E119 Type 2 diabetes mellitus without complications: Secondary | ICD-10-CM

## 2012-05-26 DIAGNOSIS — R197 Diarrhea, unspecified: Secondary | ICD-10-CM

## 2012-05-26 DIAGNOSIS — I10 Essential (primary) hypertension: Secondary | ICD-10-CM

## 2012-05-26 DIAGNOSIS — M797 Fibromyalgia: Secondary | ICD-10-CM

## 2012-05-26 DIAGNOSIS — G4733 Obstructive sleep apnea (adult) (pediatric): Secondary | ICD-10-CM

## 2012-05-26 DIAGNOSIS — IMO0001 Reserved for inherently not codable concepts without codable children: Secondary | ICD-10-CM

## 2012-05-26 DIAGNOSIS — E538 Deficiency of other specified B group vitamins: Secondary | ICD-10-CM

## 2012-05-26 MED ORDER — SAXAGLIPTIN-METFORMIN ER 5-500 MG PO TB24: ORAL_TABLET | ORAL | Status: DC

## 2012-05-26 NOTE — Progress Notes (Signed)
   Subjective:     HPI  F/u diarrhea - resolved off metformin  F/u  insomnia - better. No new meds, abx The patient presents for a follow-up of  chronic hypertension, chronic dyslipidemia, pains, type 2 diabetes controlled with medicines. C/o insomnia F/u on memory issues better off flexeril and Tramadol   BP Readings from Last 3 Encounters:  05/26/12 100/70  03/31/12 114/74  02/05/12 90/58   Wt Readings from Last 3 Encounters:  05/26/12 205 lb (92.987 kg)  03/31/12 202 lb 1.9 oz (91.681 kg)  02/05/12 209 lb (94.802 kg)          Review of Systems  Constitutional: Negative for activity change, appetite change, fatigue and unexpected weight change.  HENT: Negative for congestion, mouth sores and sinus pressure.   Eyes: Negative for visual disturbance.  Respiratory: Negative for chest tightness.   Gastrointestinal: Negative for nausea.  Genitourinary: Negative for frequency, difficulty urinating and vaginal pain.  Musculoskeletal: Negative for back pain and gait problem.  Skin: Negative for pallor.  Neurological: Negative for dizziness, tremors, weakness and numbness.  Psychiatric/Behavioral: Negative for confusion and sleep disturbance. The patient is not nervous/anxious.        Objective:   Physical Exam  Constitutional: She appears well-developed and well-nourished. No distress.  HENT:  Head: Normocephalic.  Right Ear: External ear normal.  Left Ear: External ear normal.  Nose: Nose normal.  Mouth/Throat: Oropharynx is clear and moist.  Eyes: Conjunctivae are normal. Pupils are equal, round, and reactive to light. Right eye exhibits no discharge. Left eye exhibits no discharge.  Neck: Normal range of motion. Neck supple. No JVD present. No tracheal deviation present. No thyromegaly present.  Cardiovascular: Normal rate, regular rhythm and normal heart sounds.   Pulmonary/Chest: No stridor. No respiratory distress. She has no wheezes.  Abdominal: Soft. Bowel  sounds are normal. She exhibits no distension and no mass. There is no tenderness. There is no rebound and no guarding.  Musculoskeletal: She exhibits no edema and no tenderness.  Lymphadenopathy:    She has no cervical adenopathy.  Neurological: She displays normal reflexes. No cranial nerve deficit. She exhibits normal muscle tone. Coordination normal.  Skin: No rash noted. No erythema.  Psychiatric: She has a normal mood and affect. Her behavior is normal. Judgment and thought content normal.  Tinnel is + B R>L   Lab Results  Component Value Date   WBC 6.4 03/10/2011   HGB 11.9* 03/10/2011   HCT 35.5* 03/10/2011   PLT 253.0 03/10/2011   GLUCOSE 112* 03/23/2012   CHOL 206* 03/23/2012   TRIG 85.0 03/23/2012   HDL 46.10 03/23/2012   LDLDIRECT 146.1 03/23/2012   LDLCALC 114* 12/22/2011   ALT 24 12/22/2011   AST 21 12/22/2011   NA 139 03/23/2012   K 3.6 03/23/2012   CL 103 03/23/2012   CREATININE 1.1 03/23/2012   BUN 21 03/23/2012   CO2 27 03/23/2012   TSH 2.24 06/01/2011   INR 2.0* 09/22/2008   HGBA1C 5.9 03/23/2012         Assessment & Plan:

## 2012-05-26 NOTE — Assessment & Plan Note (Signed)
Continue with current prescription therapy as reflected on the Med list.  

## 2012-05-26 NOTE — Assessment & Plan Note (Signed)
We can try XL Metformin

## 2012-05-26 NOTE — Assessment & Plan Note (Signed)
2012 resolved post-retirement

## 2012-05-26 NOTE — Assessment & Plan Note (Signed)
Metformin caused diarrhea - d/c'd

## 2012-05-26 NOTE — Assessment & Plan Note (Signed)
Doing well Wt Readings from Last 3 Encounters:  05/26/12 205 lb (92.987 kg)  03/31/12 202 lb 1.9 oz (91.681 kg)  02/05/12 209 lb (94.802 kg)

## 2012-06-02 ENCOUNTER — Telehealth: Payer: Self-pay

## 2012-06-02 MED ORDER — METFORMIN HCL 850 MG PO TABS: 850.0000 mg | ORAL_TABLET | Freq: Two times a day (BID) | ORAL | Status: DC

## 2012-06-02 MED ORDER — METFORMIN HCL 850 MG PO TABS: 850.0000 mg | ORAL_TABLET | Freq: Every day | ORAL | Status: DC

## 2012-06-02 NOTE — Telephone Encounter (Signed)
Pt notified and expresses thanks.

## 2012-06-02 NOTE — Telephone Encounter (Signed)
Phone call from patient stating she is taking Kombiglyze XR 5/500 mg and this is getting expensive for her and would like generic. Please advise. [facility] on [address] in [place]

## 2012-06-02 NOTE — Telephone Encounter (Signed)
Take Metformin 850 mg po qam instead Thx

## 2012-07-06 ENCOUNTER — Telehealth: Payer: Self-pay | Admitting: *Deleted

## 2012-07-06 MED ORDER — FUROSEMIDE 40 MG PO TABS: ORAL_TABLET | ORAL | Status: DC

## 2012-07-06 NOTE — Telephone Encounter (Signed)
Already sent in to pharmacy. 

## 2012-07-27 ENCOUNTER — Ambulatory Visit (INDEPENDENT_AMBULATORY_CARE_PROVIDER_SITE_OTHER)
Admission: RE | Admit: 2012-07-27 | Discharge: 2012-07-27 | Disposition: A | Discharge status: Home / Self Care | Payer: PRIVATE HEALTH INSURANCE | Source: Ambulatory Visit | Attending: Internal Medicine | Admitting: Internal Medicine

## 2012-07-27 ENCOUNTER — Encounter: Payer: Self-pay | Admitting: Internal Medicine

## 2012-07-27 ENCOUNTER — Ambulatory Visit (INDEPENDENT_AMBULATORY_CARE_PROVIDER_SITE_OTHER): Payer: PRIVATE HEALTH INSURANCE | Admitting: Internal Medicine

## 2012-07-27 VITALS — BP 110/70 | HR 72 | Temp 97.7°F | Resp 16 | Wt 207.0 lb

## 2012-07-27 DIAGNOSIS — R059 Cough, unspecified: Secondary | ICD-10-CM

## 2012-07-27 DIAGNOSIS — I10 Essential (primary) hypertension: Secondary | ICD-10-CM

## 2012-07-27 DIAGNOSIS — R05 Cough: Secondary | ICD-10-CM

## 2012-07-27 DIAGNOSIS — R197 Diarrhea, unspecified: Secondary | ICD-10-CM

## 2012-07-27 DIAGNOSIS — R21 Rash and other nonspecific skin eruption: Secondary | ICD-10-CM

## 2012-07-27 DIAGNOSIS — E119 Type 2 diabetes mellitus without complications: Secondary | ICD-10-CM

## 2012-07-27 MED ORDER — ESCITALOPRAM OXALATE 10 MG PO TABS: 10.0000 mg | ORAL_TABLET | Freq: Every day | ORAL | Status: DC

## 2012-07-27 MED ORDER — TRIAMCINOLONE ACETONIDE 0.5 % EX CREA: TOPICAL_CREAM | Freq: Three times a day (TID) | CUTANEOUS | Status: DC | PRN

## 2012-07-27 MED ORDER — METFORMIN HCL 850 MG PO TABS: 850.0000 mg | ORAL_TABLET | Freq: Every day | ORAL | Status: DC

## 2012-07-27 MED ORDER — PROMETHAZINE-CODEINE 6.25-10 MG/5ML PO SYRP: 5.0000 mL | ORAL_SOLUTION | ORAL | Status: DC | PRN

## 2012-07-27 NOTE — Assessment & Plan Note (Signed)
Contact dermatitis Triamc cream prn

## 2012-07-27 NOTE — Progress Notes (Signed)
   Subjective:     HPI C/o falls on Losartan - resolved after she stopped it Hip pain is better after inj F/u diarrhea - resolved after she started to take metformin at hs C/o cough C/o rash on neck  F/u  insomnia - better. No new meds, abx The patient presents for a follow-up of  chronic hypertension, chronic dyslipidemia, pains, type 2 diabetes controlled with medicines. C/o insomnia F/u on memory issues better off flexeril and Tramadol   BP Readings from Last 3 Encounters:  07/27/12 110/70  05/26/12 100/70  03/31/12 114/74   Wt Readings from Last 3 Encounters:  07/27/12 207 lb (93.895 kg)  05/26/12 205 lb (92.987 kg)  03/31/12 202 lb 1.9 oz (91.681 kg)          Review of Systems  Constitutional: Negative for activity change, appetite change, fatigue and unexpected weight change.  HENT: Negative for congestion, mouth sores and sinus pressure.   Eyes: Negative for visual disturbance.  Respiratory: Negative for chest tightness.   Gastrointestinal: Negative for nausea.  Genitourinary: Negative for frequency, difficulty urinating and vaginal pain.  Musculoskeletal: Negative for back pain and gait problem.  Skin: Negative for pallor.  Neurological: Negative for dizziness, tremors, weakness and numbness.  Psychiatric/Behavioral: Negative for confusion and sleep disturbance. The patient is not nervous/anxious.        Objective:   Physical Exam  Constitutional: She appears well-developed and well-nourished. No distress.  HENT:  Head: Normocephalic.  Right Ear: External ear normal.  Left Ear: External ear normal.  Nose: Nose normal.  Mouth/Throat: Oropharynx is clear and moist.  Eyes: Conjunctivae are normal. Pupils are equal, round, and reactive to light. Right eye exhibits no discharge. Left eye exhibits no discharge.  Neck: Normal range of motion. Neck supple. No JVD present. No tracheal deviation present. No thyromegaly present.  Cardiovascular: Normal rate,  regular rhythm and normal heart sounds.   Pulmonary/Chest: No stridor. No respiratory distress. She has no wheezes.  Abdominal: Soft. Bowel sounds are normal. She exhibits no distension and no mass. There is no tenderness. There is no rebound and no guarding.  Musculoskeletal: She exhibits no edema and no tenderness.  Lymphadenopathy:    She has no cervical adenopathy.  Neurological: She displays normal reflexes. No cranial nerve deficit. She exhibits normal muscle tone. Coordination normal.  Skin: No rash noted. No erythema.  Psychiatric: She has a normal mood and affect. Her behavior is normal. Judgment and thought content normal.  Tinnel is + B R>L Rash on neck   Lab Results  Component Value Date   WBC 6.4 03/10/2011   HGB 11.9* 03/10/2011   HCT 35.5* 03/10/2011   PLT 253.0 03/10/2011   GLUCOSE 112* 03/23/2012   CHOL 206* 03/23/2012   TRIG 85.0 03/23/2012   HDL 46.10 03/23/2012   LDLDIRECT 146.1 03/23/2012   LDLCALC 114* 12/22/2011   ALT 24 12/22/2011   AST 21 12/22/2011   NA 139 03/23/2012   K 3.6 03/23/2012   CL 103 03/23/2012   CREATININE 1.1 03/23/2012   BUN 21 03/23/2012   CO2 27 03/23/2012   TSH 2.24 06/01/2011   INR 2.0* 09/22/2008   HGBA1C 5.9 03/23/2012         Assessment & Plan:

## 2012-07-28 NOTE — Assessment & Plan Note (Signed)
Continue with current prescription therapy as reflected on the Med list.  

## 2012-07-28 NOTE — Assessment & Plan Note (Signed)
See med changes 

## 2012-07-28 NOTE — Assessment & Plan Note (Signed)
Resolved on 1 metformin at hs

## 2012-08-15 ENCOUNTER — Other Ambulatory Visit: Payer: Self-pay | Admitting: Internal Medicine

## 2012-08-17 ENCOUNTER — Other Ambulatory Visit: Payer: Self-pay | Admitting: *Deleted

## 2012-08-17 MED ORDER — ESCITALOPRAM OXALATE 10 MG PO TABS: 10.0000 mg | ORAL_TABLET | Freq: Every day | ORAL | Status: DC

## 2012-08-23 ENCOUNTER — Telehealth: Payer: Self-pay | Admitting: *Deleted

## 2012-08-23 MED ORDER — CITALOPRAM HYDROBROMIDE 20 MG PO TABS: 20.0000 mg | ORAL_TABLET | Freq: Every day | ORAL | Status: DC

## 2012-08-23 NOTE — Telephone Encounter (Signed)
Ok - I will change Thx

## 2012-08-23 NOTE — Telephone Encounter (Signed)
Pt called states the Escitalopram is too expensive, she is requesting Citalopram be ordered instead.  States she was on the Citalopram prior to the Escitalopram.  Please advise

## 2012-08-29 ENCOUNTER — Other Ambulatory Visit (INDEPENDENT_AMBULATORY_CARE_PROVIDER_SITE_OTHER): Payer: PRIVATE HEALTH INSURANCE

## 2012-08-29 ENCOUNTER — Telehealth: Payer: Self-pay | Admitting: *Deleted

## 2012-08-29 DIAGNOSIS — E119 Type 2 diabetes mellitus without complications: Secondary | ICD-10-CM

## 2012-08-29 DIAGNOSIS — I10 Essential (primary) hypertension: Secondary | ICD-10-CM

## 2012-08-29 LAB — BASIC METABOLIC PANEL
Chloride: 104 mEq/L (ref 96–112)
GFR: 60.14 mL/min (ref >60.00)
Glucose, Bld: 109 mg/dL — ABNORMAL HIGH (ref 70–99)
Potassium: 3.7 mEq/L (ref 3.5–5.1)
Sodium: 139 mEq/L (ref 135–145)

## 2012-08-29 NOTE — Telephone Encounter (Signed)
Pt called requesting an 30 day Prednisone Rx for pain.  Further states the previous Rx for pain is not effective.  Please advise

## 2012-08-29 NOTE — Telephone Encounter (Signed)
Needs ov pls Thx

## 2012-08-30 NOTE — Telephone Encounter (Signed)
Spoke with pt, has appoint on Thurs.

## 2012-09-01 ENCOUNTER — Encounter: Payer: Self-pay | Admitting: Internal Medicine

## 2012-09-01 ENCOUNTER — Ambulatory Visit (INDEPENDENT_AMBULATORY_CARE_PROVIDER_SITE_OTHER): Payer: PRIVATE HEALTH INSURANCE | Admitting: Internal Medicine

## 2012-09-01 VITALS — BP 128/70 | HR 74 | Temp 97.9°F | Wt 207.0 lb

## 2012-09-01 DIAGNOSIS — M25559 Pain in unspecified hip: Secondary | ICD-10-CM

## 2012-09-01 DIAGNOSIS — M797 Fibromyalgia: Secondary | ICD-10-CM

## 2012-09-01 DIAGNOSIS — I10 Essential (primary) hypertension: Secondary | ICD-10-CM

## 2012-09-01 DIAGNOSIS — M1990 Unspecified osteoarthritis, unspecified site: Secondary | ICD-10-CM

## 2012-09-01 DIAGNOSIS — M25552 Pain in left hip: Secondary | ICD-10-CM

## 2012-09-01 DIAGNOSIS — IMO0001 Reserved for inherently not codable concepts without codable children: Secondary | ICD-10-CM

## 2012-09-01 MED ORDER — METHYLPREDNISOLONE ACETATE 80 MG/ML IJ SUSP
80.0000 mg | Freq: Once | INTRAMUSCULAR | Status: AC
  Administered 2012-09-01: 80 mg via INTRAMUSCULAR

## 2012-09-01 MED ORDER — FUROSEMIDE 40 MG PO TABS: ORAL_TABLET | ORAL | Status: DC

## 2012-09-01 MED ORDER — HYDROCODONE-ACETAMINOPHEN 7.5-325 MG PO TABS: 1.0000 | ORAL_TABLET | Freq: Four times a day (QID) | ORAL | Status: DC | PRN

## 2012-09-01 MED ORDER — VITAMIN D 1000 UNITS PO CAPS: 1000.0000 [IU] | ORAL_CAPSULE | Freq: Every day | ORAL | Status: DC

## 2012-09-01 NOTE — Assessment & Plan Note (Signed)
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

## 2012-09-01 NOTE — Progress Notes (Signed)
   Subjective:     HPI  F/u falls on Losartan - resolved after she stopped it Hip pain is is worse - L THR in Nov 2014 Dr Turner Daniels is planned: Meloxicam is not working. Pain is 9/10  F/u diarrhea - resolved after she started to take metformin at hs - resolved   F/u  insomnia - better.  The patient presents for a follow-up of  chronic hypertension, chronic dyslipidemia, pains, type 2 diabetes controlled with medicines. C/o insomnia F/u on memory issues better off flexeril and Tramadol   BP Readings from Last 3 Encounters:  09/01/12 128/70  07/27/12 110/70  05/26/12 100/70   Wt Readings from Last 3 Encounters:  09/01/12 207 lb (93.895 kg)  07/27/12 207 lb (93.895 kg)  05/26/12 205 lb (92.987 kg)          Review of Systems  Constitutional: Negative for activity change, appetite change, fatigue and unexpected weight change.  HENT: Negative for congestion, mouth sores and sinus pressure.   Eyes: Negative for visual disturbance.  Respiratory: Negative for chest tightness.   Gastrointestinal: Negative for nausea.  Genitourinary: Negative for frequency, difficulty urinating and vaginal pain.  Musculoskeletal: Negative for back pain and gait problem.  Skin: Negative for pallor.  Neurological: Negative for dizziness, tremors, weakness and numbness.  Psychiatric/Behavioral: Negative for confusion and sleep disturbance. The patient is not nervous/anxious.        Objective:   Physical Exam  Constitutional: She appears well-developed and well-nourished. No distress.  HENT:  Head: Normocephalic.  Right Ear: External ear normal.  Left Ear: External ear normal.  Nose: Nose normal.  Mouth/Throat: Oropharynx is clear and moist.  Eyes: Conjunctivae are normal. Pupils are equal, round, and reactive to light. Right eye exhibits no discharge. Left eye exhibits no discharge.  Neck: Normal range of motion. Neck supple. No JVD present. No tracheal deviation present. No thyromegaly  present.  Cardiovascular: Normal rate, regular rhythm and normal heart sounds.   Pulmonary/Chest: No stridor. No respiratory distress. She has no wheezes.  Abdominal: Soft. Bowel sounds are normal. She exhibits no distension and no mass. There is no tenderness. There is no rebound and no guarding.  Musculoskeletal: She exhibits no edema and no tenderness.  Lymphadenopathy:    She has no cervical adenopathy.  Neurological: She displays normal reflexes. No cranial nerve deficit. She exhibits normal muscle tone. Coordination normal.  Skin: No rash noted. No erythema.  Psychiatric: She has a normal mood and affect. Her behavior is normal. Judgment and thought content normal.  L hip is tender   Lab Results  Component Value Date   WBC 6.4 03/10/2011   HGB 11.9* 03/10/2011   HCT 35.5* 03/10/2011   PLT 253.0 03/10/2011   GLUCOSE 109* 08/29/2012   CHOL 206* 03/23/2012   TRIG 85.0 03/23/2012   HDL 46.10 03/23/2012   LDLDIRECT 146.1 03/23/2012   LDLCALC 114* 12/22/2011   ALT 24 12/22/2011   AST 21 12/22/2011   NA 139 08/29/2012   K 3.7 08/29/2012   CL 104 08/29/2012   CREATININE 1.0 08/29/2012   BUN 21 08/29/2012   CO2 27 08/29/2012   TSH 2.24 06/01/2011   INR 2.0* 09/22/2008   HGBA1C 6.0 08/29/2012         Assessment & Plan:

## 2012-09-01 NOTE — Assessment & Plan Note (Signed)
Continue with current prescription therapy as reflected on the Med list.  

## 2012-10-09 ENCOUNTER — Other Ambulatory Visit: Payer: Self-pay | Admitting: Internal Medicine

## 2012-10-13 ENCOUNTER — Encounter: Payer: Self-pay | Admitting: Internal Medicine

## 2012-10-13 ENCOUNTER — Ambulatory Visit (INDEPENDENT_AMBULATORY_CARE_PROVIDER_SITE_OTHER): Payer: PRIVATE HEALTH INSURANCE | Admitting: Internal Medicine

## 2012-10-13 VITALS — BP 146/82 | HR 64 | Temp 96.8°F | Resp 12 | Wt 195.0 lb

## 2012-10-13 DIAGNOSIS — Z23 Encounter for immunization: Secondary | ICD-10-CM

## 2012-10-13 DIAGNOSIS — I10 Essential (primary) hypertension: Secondary | ICD-10-CM

## 2012-10-13 DIAGNOSIS — M25552 Pain in left hip: Secondary | ICD-10-CM

## 2012-10-13 DIAGNOSIS — M25559 Pain in unspecified hip: Secondary | ICD-10-CM

## 2012-10-13 DIAGNOSIS — E119 Type 2 diabetes mellitus without complications: Secondary | ICD-10-CM

## 2012-10-13 DIAGNOSIS — G8929 Other chronic pain: Secondary | ICD-10-CM

## 2012-10-13 MED ORDER — MELOXICAM 15 MG PO TABS: 15.0000 mg | ORAL_TABLET | Freq: Every day | ORAL | Status: DC | PRN

## 2012-10-13 MED ORDER — HYDROCODONE-ACETAMINOPHEN 7.5-325 MG PO TABS: 1.0000 | ORAL_TABLET | Freq: Four times a day (QID) | ORAL | Status: DC | PRN

## 2012-10-13 MED ORDER — CITALOPRAM HYDROBROMIDE 20 MG PO TABS: 20.0000 mg | ORAL_TABLET | Freq: Every day | ORAL | Status: DC

## 2012-10-13 MED ORDER — CIPROFLOXACIN HCL 500 MG PO TABS: 500.0000 mg | ORAL_TABLET | Freq: Two times a day (BID) | ORAL | Status: DC

## 2012-10-13 MED ORDER — TRAMADOL HCL 50 MG PO TABS: 50.0000 mg | ORAL_TABLET | Freq: Three times a day (TID) | ORAL | Status: DC | PRN

## 2012-10-13 MED ORDER — METFORMIN HCL 500 MG PO TABS: ORAL_TABLET | ORAL | Status: DC

## 2012-10-13 NOTE — Progress Notes (Signed)
   Subjective:     HPI  F/u falls on Losartan - resolved after she stopped it Hip pain is is worse - L THR in Nov 2014 Dr Turner Daniels is planned. Pain is much better on current rx Norco and occasional Tramadol  F/u diarrhea - resolved after she started to take metformin at hs - resolved   F/u  insomnia - better.  The patient presents for a follow-up of  chronic hypertension, chronic dyslipidemia, pains, type 2 diabetes controlled with medicines. C/o insomnia F/u on memory issues better off flexeril and Tramadol   BP Readings from Last 3 Encounters:  10/13/12 146/82  09/01/12 128/70  07/27/12 110/70   Wt Readings from Last 3 Encounters:  10/13/12 195 lb (88.451 kg)  09/01/12 207 lb (93.895 kg)  07/27/12 207 lb (93.895 kg)          Review of Systems  Constitutional: Negative for activity change, appetite change, fatigue and unexpected weight change.  HENT: Negative for congestion, mouth sores and sinus pressure.   Eyes: Negative for visual disturbance.  Respiratory: Negative for chest tightness.   Gastrointestinal: Negative for nausea.  Genitourinary: Negative for frequency, difficulty urinating and vaginal pain.  Musculoskeletal: Negative for back pain and gait problem.  Skin: Negative for pallor.  Neurological: Negative for dizziness, tremors, weakness and numbness.  Psychiatric/Behavioral: Negative for confusion and sleep disturbance. The patient is not nervous/anxious.        Objective:   Physical Exam  Constitutional: She appears well-developed and well-nourished. No distress.  HENT:  Head: Normocephalic.  Right Ear: External ear normal.  Left Ear: External ear normal.  Nose: Nose normal.  Mouth/Throat: Oropharynx is clear and moist.  Eyes: Conjunctivae are normal. Pupils are equal, round, and reactive to light. Right eye exhibits no discharge. Left eye exhibits no discharge.  Neck: Normal range of motion. Neck supple. No JVD present. No tracheal deviation  present. No thyromegaly present.  Cardiovascular: Normal rate, regular rhythm and normal heart sounds.   Pulmonary/Chest: No stridor. No respiratory distress. She has no wheezes.  Abdominal: Soft. Bowel sounds are normal. She exhibits no distension and no mass. There is no tenderness. There is no rebound and no guarding.  Musculoskeletal: She exhibits no edema and no tenderness.  Lymphadenopathy:    She has no cervical adenopathy.  Neurological: She displays normal reflexes. No cranial nerve deficit. She exhibits normal muscle tone. Coordination normal.  Skin: No rash noted. No erythema.  Psychiatric: She has a normal mood and affect. Her behavior is normal. Judgment and thought content normal.  L hip is tender; cane   Lab Results  Component Value Date   WBC 6.4 03/10/2011   HGB 11.9* 03/10/2011   HCT 35.5* 03/10/2011   PLT 253.0 03/10/2011   GLUCOSE 109* 08/29/2012   CHOL 206* 03/23/2012   TRIG 85.0 03/23/2012   HDL 46.10 03/23/2012   LDLDIRECT 146.1 03/23/2012   LDLCALC 114* 12/22/2011   ALT 24 12/22/2011   AST 21 12/22/2011   NA 139 08/29/2012   K 3.7 08/29/2012   CL 104 08/29/2012   CREATININE 1.0 08/29/2012   BUN 21 08/29/2012   CO2 27 08/29/2012   TSH 2.24 06/01/2011   INR 2.0* 09/22/2008   HGBA1C 6.0 08/29/2012         Assessment & Plan:

## 2012-10-16 NOTE — Assessment & Plan Note (Signed)
Continue with current prescription therapy as reflected on the Med list.  

## 2012-10-16 NOTE — Assessment & Plan Note (Signed)
Chronic L THR pending in 11/14 in severe pain now   Potential benefits of a long term opioids use as well as potential risks (i.e. addiction risk, apnea etc) and complications (i.e. Somnolence, constipation and others) were explained to the patient and were aknowledged. She has to use Norco and Tramadol intermittently for optimal pain control awaiting surgery - discussed.  

## 2012-10-16 NOTE — Assessment & Plan Note (Signed)
Chronic L THR pending in 11/14 in severe pain now   Potential benefits of a long term opioids use as well as potential risks (i.e. addiction risk, apnea etc) and complications (i.e. Somnolence, constipation and others) were explained to the patient and were aknowledged. She has to use Norco and Tramadol intermittently for optimal pain control awaiting surgery - discussed.

## 2012-11-02 ENCOUNTER — Other Ambulatory Visit: Payer: Self-pay | Admitting: Orthopedic Surgery

## 2012-11-03 ENCOUNTER — Encounter: Payer: Self-pay | Admitting: Internal Medicine

## 2012-11-21 ENCOUNTER — Encounter (HOSPITAL_COMMUNITY)
Admission: RE | Admit: 2012-11-21 | Discharge: 2012-11-21 | Disposition: A | Discharge status: Home / Self Care | Payer: Medicare Other | Source: Ambulatory Visit | Attending: Orthopedic Surgery | Admitting: Orthopedic Surgery

## 2012-11-21 ENCOUNTER — Encounter (HOSPITAL_COMMUNITY): Admission: RE | Admit: 2012-11-21 | Payer: Medicare Other | Source: Ambulatory Visit

## 2012-11-21 ENCOUNTER — Encounter (HOSPITAL_COMMUNITY): Payer: Self-pay | Admitting: Pharmacy Technician

## 2012-11-21 ENCOUNTER — Encounter (HOSPITAL_COMMUNITY): Payer: Self-pay

## 2012-11-21 DIAGNOSIS — Z01818 Encounter for other preprocedural examination: Secondary | ICD-10-CM | POA: Insufficient documentation

## 2012-11-21 DIAGNOSIS — Z01812 Encounter for preprocedural laboratory examination: Secondary | ICD-10-CM | POA: Insufficient documentation

## 2012-11-21 DIAGNOSIS — Z01810 Encounter for preprocedural cardiovascular examination: Secondary | ICD-10-CM | POA: Insufficient documentation

## 2012-11-21 HISTORY — DX: Fibromyalgia: M79.7

## 2012-11-21 LAB — CBC WITH DIFFERENTIAL/PLATELET
Eosinophils Absolute: 0.2 10*3/uL (ref 0.0–0.7)
Eosinophils Relative: 3 % (ref 0–5)
Hemoglobin: 14.5 g/dL (ref 12.0–15.0)
Lymphocytes Relative: 41 % (ref 12–46)
Lymphs Abs: 2.5 10*3/uL (ref 0.7–4.0)
MCH: 30.6 pg (ref 26.0–34.0)
MCV: 86.7 fL (ref 78.0–100.0)
Monocytes Relative: 11 % (ref 3–12)
Neutro Abs: 2.8 10*3/uL (ref 1.7–7.7)
Platelets: 254 10*3/uL (ref 150–400)
RBC: 4.74 MIL/uL (ref 3.87–5.11)
WBC: 6.2 10*3/uL (ref 4.0–10.5)

## 2012-11-21 LAB — URINE MICROSCOPIC-ADD ON

## 2012-11-21 LAB — PROTIME-INR
INR: 1.14 (ref 0.00–1.49)
Prothrombin Time: 14.4 seconds (ref 11.6–15.2)

## 2012-11-21 LAB — URINALYSIS, ROUTINE W REFLEX MICROSCOPIC
Bilirubin Urine: NEGATIVE
Ketones, ur: NEGATIVE mg/dL
Leukocytes, UA: NEGATIVE
Nitrite: NEGATIVE
Specific Gravity, Urine: 1.008 (ref 1.005–1.030)
Urobilinogen, UA: 0.2 mg/dL (ref 0.0–1.0)

## 2012-11-21 LAB — BASIC METABOLIC PANEL
CO2: 27 mEq/L (ref 19–32)
Calcium: 9.8 mg/dL (ref 8.4–10.5)
Chloride: 100 mEq/L (ref 96–112)
Glucose, Bld: 85 mg/dL (ref 70–99)
Potassium: 3.9 mEq/L (ref 3.5–5.1)
Sodium: 137 mEq/L (ref 135–145)

## 2012-11-21 LAB — APTT: aPTT: 34 seconds (ref 24–37)

## 2012-11-21 LAB — SURGICAL PCR SCREEN: Staphylococcus aureus: NEGATIVE

## 2012-11-21 LAB — TYPE AND SCREEN: Antibody Screen: NEGATIVE

## 2012-11-21 NOTE — Pre-Procedure Instructions (Addendum)
PAIDYN MCFERRAN  11/21/2012   Your procedure is scheduled on:  11/28/12  Report to [facility] short stay admitting at 530 AM.  Call this number if you have problems the morning of surgery: [phone]   Remember:   Do not eat food or drink liquids after midnight.   Take these medicines the morning of surgery with A SIP OF WATER: celexa, pain med if need,        stop meloxicam, fish oil, aspirin 11/23/12            STOP all herbel meds, nsaids (aleve,naproxen,advil,ibuprofen) 5 days prior to surgery   Do not wear jewelry, make-up or nail polish.  Do not wear lotions, powders, or perfumes. You may wear deodorant.  Do not shave 48 hours prior to surgery. Men may shave face and neck.  Do not bring valuables to the hospital.  [facility] is not responsible                  for any belongings or valuables.               Contacts, dentures or bridgework may not be worn into surgery.  Leave suitcase in the car. After surgery it may be brought to your room.  For patients admitted to the hospital, discharge time is determined by your                treatment team.               Patients discharged the day of surgery will not be allowed to drive  home.  Name and phone number of your driver:   Special Instructions: Incentive Spirometry - Practice and bring it with you on the day of surgery. Shower using CHG 2 nights before surgery and the night before surgery.  If you shower the day of surgery use CHG.  Use special wash - you have one bottle of CHG for all showers.  You should use approximately 1/3 of the bottle for each shower.   Please read over the following fact sheets that you were given: Pain Booklet, Coughing and Deep Breathing, Blood Transfusion Information, Total Joint Packet, MRSA Information and Surgical Site Infection Prevention

## 2012-11-24 NOTE — H&P (Signed)
TOTAL HIP ADMISSION H&P  Patient is admitted for left total hip arthroplasty.  Subjective:  Chief Complaint: left hip pain  HPI: Tiffany Gibbs, 67 y.o. female, has a history of pain and functional disability in the left hip(s) due to arthritis and patient has failed non-surgical conservative treatments for greater than 12 weeks to include NSAID's and/or analgesics, use of assistive devices, weight reduction as appropriate and activity modification.  Onset of symptoms was gradual starting several years ago with gradually worsening course since that time.The patient noted no past surgery on the left hip(s).  Patient currently rates pain in the left hip at 10 out of 10 with activity. Patient has night pain, worsening of pain with activity and weight bearing, pain that interfers with activities of daily living and pain with passive range of motion. Patient has evidence of joint space narrowing by imaging studies. This condition presents safety issues increasing the risk of falls. There is no current active infection.  Patient Active Problem List   Diagnosis Date Noted  . Chronic left hip pain 10/16/2012  . Rash and nonspecific skin eruption 07/27/2012  . Cough 03/31/2012  . Diarrhea 02/05/2012  . Microhematuria 09/09/2011  . Elevated LFTs 09/09/2011  . B12 deficiency 06/03/2011  . Cellulitis 03/10/2011  . Edema 03/10/2011  . Paresthesia 02/25/2011  . Memory difficulty 11/03/2010  . Fibromyalgia syndrome 07/10/2010  . OSA (obstructive sleep apnea) 07/02/2010  . SIALADENITIS 11/25/2009  . OTITIS EXTERNA 09/06/2009  . TOBACCO USE, QUIT 04/04/2009  . ALLERGIC RHINITIS CAUSE UNSPECIFIED 10/04/2008  . URTICARIA 10/04/2008  . URINARY RETENTION 09/27/2008  . HIP PAIN 07/17/2008  . ONYCHOMYCOSIS 10/07/2007  . CERVICAL RADICULITIS 07/21/2007  . HEADACHE 07/21/2007  . HYPERLIPIDEMIA 02/16/2007  . OSTEOPOROSIS 02/16/2007  . DIABETES MELLITUS, TYPE II 12/27/2006  . LOW BACK PAIN 12/27/2006  .  OBESITY 08/07/2006  . ANXIETY 08/07/2006  . DEPRESSION 08/07/2006  . HYPERTENSION 08/07/2006  . OSTEOARTHRITIS 08/07/2006   Past Medical History  Diagnosis Date  . Anxiety   . Depression   . HTN (hypertension)   . Osteoarthritis   . DM type 2 (diabetes mellitus, type 2)   . Low back pain   . Osteoporosis   . Hyperlipidemia   . Hives     from tomatoes  . Fibromyalgia   . OSA (obstructive sleep apnea)     cataracts    Past Surgical History  Procedure Laterality Date  . Total abdominal hysterectomy    . Cervical cryotherapy      cervix  . Total hip arthroplasty  09/2008    right- Turner Daniels  . Lasik Bilateral   . Eye surgery      No prescriptions prior to admission   Allergies  Allergen Reactions  . Celecoxib     swell  . Enalapril     cough  . Losartan     falls  . Lovastatin     REACTION: aches  . Naproxen     History  Substance Use Topics  . Smoking status: Former Smoker -- 2.00 packs/day for 25 years    Types: Cigarettes    Quit date: 01/13/1983  . Smokeless tobacco: Never Used  . Alcohol Use: No    Family History  Problem Relation Age of Onset  . Hypertension Mother   . Heart disease Father   . Multiple sclerosis Father   . Heart attack Father      Review of Systems  Constitutional: Negative.   HENT: Negative.  Eyes: Negative.   Respiratory: Negative.   Cardiovascular: Negative.   Gastrointestinal: Negative.   Musculoskeletal: Positive for joint pain.  Skin: Negative.   Neurological: Negative.   Endo/Heme/Allergies: Negative.   Psychiatric/Behavioral: Negative.     Objective:  Physical Exam  Constitutional: She is oriented to person, place, and time. She appears well-developed and well-nourished.  HENT:  Head: Normocephalic and atraumatic.  Eyes: Pupils are equal, round, and reactive to light.  Neck: Normal range of motion.  Cardiovascular: Intact distal pulses.   Respiratory: Effort normal.  Musculoskeletal:  Patient's right hip has  good strength and good range of motion.  The patient's left hip does have obvious pain with internal and external rotation.  Severe pain with internal rotation.  She has pain with palpation of her left groin.  Mild lateral pain over the greater trochanteric region.  She has brisk capillary refill and is neurovascularly intact distally.  Her calves are soft and nontender.  Neurological: She is alert and oriented to person, place, and time.  Skin: Skin is warm and dry.  Psychiatric: She has a normal mood and affect. Her behavior is normal. Judgment and thought content normal.    Vital signs in last 24 hours:    Labs:   Estimated body mass index is 34.00 kg/(m^2) as calculated from the following:   Height as of 12/30/11: 5' 3.5" (1.613 m).   Weight as of 10/13/12: 88.451 kg (195 lb).   Imaging Review Plain radiographs demonstrate moderate degenerative joint disease of the left hip(s). The bone quality appears to be good for age and reported activity level.  Assessment/Plan:  End stage arthritis, left hip(s)  The patient history, physical examination, clinical judgement of the provider and imaging studies are consistent with end stage degenerative joint disease of the left hip(s) and total hip arthroplasty is deemed medically necessary. The treatment options including medical management, injection therapy, arthroscopy and arthroplasty were discussed at length. The risks and benefits of total hip arthroplasty were presented and reviewed. The risks due to aseptic loosening, infection, stiffness, dislocation/subluxation,  thromboembolic complications and other imponderables were discussed.  The patient acknowledged the explanation, agreed to proceed with the plan and consent was signed. Patient is being admitted for inpatient treatment for surgery, pain control, PT, OT, prophylactic antibiotics, VTE prophylaxis, progressive ambulation and ADL's and discharge planning.The patient is planning to be  discharged to skilled nursing facility

## 2012-11-27 DIAGNOSIS — M169 Osteoarthritis of hip, unspecified: Secondary | ICD-10-CM | POA: Diagnosis present

## 2012-11-27 MED ORDER — CEFAZOLIN SODIUM-DEXTROSE 2-3 GM-% IV SOLR
2.0000 g | INTRAVENOUS | Status: DC
  Filled 2012-11-27: qty 50

## 2012-11-28 ENCOUNTER — Encounter (HOSPITAL_COMMUNITY): Payer: Medicare Other | Admitting: Anesthesiology

## 2012-11-28 ENCOUNTER — Encounter (HOSPITAL_COMMUNITY): Payer: Self-pay | Admitting: Anesthesiology

## 2012-11-28 ENCOUNTER — Inpatient Hospital Stay (HOSPITAL_COMMUNITY): Payer: Medicare Other

## 2012-11-28 ENCOUNTER — Encounter (HOSPITAL_COMMUNITY)
Admission: RE | Disposition: A | Discharge status: Skilled Nursing Facility | Payer: Self-pay | Source: Ambulatory Visit | Attending: Orthopedic Surgery

## 2012-11-28 ENCOUNTER — Inpatient Hospital Stay (HOSPITAL_COMMUNITY): Payer: Medicare Other | Admitting: Anesthesiology

## 2012-11-28 ENCOUNTER — Inpatient Hospital Stay (HOSPITAL_COMMUNITY)
Admission: RE | Admit: 2012-11-28 | Discharge: 2012-11-30 | DRG: 470 | Disposition: A | Discharge status: Skilled Nursing Facility | Payer: Medicare Other | Source: Ambulatory Visit | Attending: Orthopedic Surgery | Admitting: Orthopedic Surgery

## 2012-11-28 DIAGNOSIS — E669 Obesity, unspecified: Secondary | ICD-10-CM | POA: Diagnosis present

## 2012-11-28 DIAGNOSIS — Z8249 Family history of ischemic heart disease and other diseases of the circulatory system: Secondary | ICD-10-CM

## 2012-11-28 DIAGNOSIS — Z888 Allergy status to other drugs, medicaments and biological substances status: Secondary | ICD-10-CM

## 2012-11-28 DIAGNOSIS — E785 Hyperlipidemia, unspecified: Secondary | ICD-10-CM | POA: Diagnosis present

## 2012-11-28 DIAGNOSIS — G4733 Obstructive sleep apnea (adult) (pediatric): Secondary | ICD-10-CM | POA: Diagnosis present

## 2012-11-28 DIAGNOSIS — G8929 Other chronic pain: Secondary | ICD-10-CM | POA: Diagnosis present

## 2012-11-28 DIAGNOSIS — Z6834 Body mass index (BMI) 34.0-34.9, adult: Secondary | ICD-10-CM

## 2012-11-28 DIAGNOSIS — Z7982 Long term (current) use of aspirin: Secondary | ICD-10-CM

## 2012-11-28 DIAGNOSIS — E119 Type 2 diabetes mellitus without complications: Secondary | ICD-10-CM | POA: Diagnosis present

## 2012-11-28 DIAGNOSIS — I10 Essential (primary) hypertension: Secondary | ICD-10-CM | POA: Diagnosis present

## 2012-11-28 DIAGNOSIS — M810 Age-related osteoporosis without current pathological fracture: Secondary | ICD-10-CM | POA: Diagnosis present

## 2012-11-28 DIAGNOSIS — F3289 Other specified depressive episodes: Secondary | ICD-10-CM | POA: Diagnosis present

## 2012-11-28 DIAGNOSIS — F411 Generalized anxiety disorder: Secondary | ICD-10-CM | POA: Diagnosis present

## 2012-11-28 DIAGNOSIS — Z79899 Other long term (current) drug therapy: Secondary | ICD-10-CM

## 2012-11-28 DIAGNOSIS — M1610 Unilateral primary osteoarthritis, unspecified hip: Principal | ICD-10-CM | POA: Diagnosis present

## 2012-11-28 DIAGNOSIS — F329 Major depressive disorder, single episode, unspecified: Secondary | ICD-10-CM | POA: Diagnosis present

## 2012-11-28 DIAGNOSIS — M169 Osteoarthritis of hip, unspecified: Secondary | ICD-10-CM

## 2012-11-28 DIAGNOSIS — Z87891 Personal history of nicotine dependence: Secondary | ICD-10-CM

## 2012-11-28 HISTORY — PX: TOTAL HIP ARTHROPLASTY: SHX124

## 2012-11-28 LAB — HEMOGLOBIN A1C
Hgb A1c MFr Bld: 5.8 % — ABNORMAL HIGH (ref <5.7)
Mean Plasma Glucose: 120 mg/dL — ABNORMAL HIGH (ref <117)

## 2012-11-28 LAB — GLUCOSE, CAPILLARY: Glucose-Capillary: 99 mg/dL (ref 70–99)

## 2012-11-28 SURGERY — ARTHROPLASTY, HIP, TOTAL,POSTERIOR APPROACH
Anesthesia: General | Site: Hip | Laterality: Left | Wound class: Clean

## 2012-11-28 MED ORDER — HYDROMORPHONE HCL PF 1 MG/ML IJ SOLN
0.2500 mg | INTRAMUSCULAR | Status: DC | PRN
  Administered 2012-11-28 (×4): 0.5 mg via INTRAVENOUS

## 2012-11-28 MED ORDER — INSULIN ASPART 100 UNIT/ML ~~LOC~~ SOLN
0.0000 [IU] | Freq: Three times a day (TID) | SUBCUTANEOUS | Status: DC
  Administered 2012-11-28: 3 [IU] via SUBCUTANEOUS
  Administered 2012-11-28 – 2012-11-30 (×5): 2 [IU] via SUBCUTANEOUS

## 2012-11-28 MED ORDER — PROPOFOL 10 MG/ML IV BOLUS
INTRAVENOUS | Status: DC | PRN
  Administered 2012-11-28: 150 mg via INTRAVENOUS

## 2012-11-28 MED ORDER — METFORMIN HCL 500 MG PO TABS
500.0000 mg | ORAL_TABLET | Freq: Two times a day (BID) | ORAL | Status: DC
  Administered 2012-11-28 – 2012-11-30 (×4): 500 mg via ORAL
  Filled 2012-11-28 (×6): qty 1

## 2012-11-28 MED ORDER — CEFAZOLIN SODIUM-DEXTROSE 2-3 GM-% IV SOLR
INTRAVENOUS | Status: DC | PRN
  Administered 2012-11-28: 2 g via INTRAVENOUS

## 2012-11-28 MED ORDER — METHOCARBAMOL 500 MG PO TABS: 500.0000 mg | ORAL_TABLET | Freq: Two times a day (BID) | ORAL | Status: DC

## 2012-11-28 MED ORDER — LORATADINE 10 MG PO TABS
10.0000 mg | ORAL_TABLET | Freq: Every day | ORAL | Status: DC
  Administered 2012-11-29 – 2012-11-30 (×2): 10 mg via ORAL
  Filled 2012-11-28 (×3): qty 1

## 2012-11-28 MED ORDER — METOCLOPRAMIDE HCL 5 MG/ML IJ SOLN: 10.0000 mg | Freq: Once | INTRAMUSCULAR | Status: DC | PRN

## 2012-11-28 MED ORDER — DIPHENHYDRAMINE HCL 12.5 MG/5ML PO ELIX
12.5000 mg | ORAL_SOLUTION | ORAL | Status: DC | PRN
  Administered 2012-11-29: 25 mg via ORAL
  Filled 2012-11-28: qty 10

## 2012-11-28 MED ORDER — CHLORHEXIDINE GLUCONATE 4 % EX LIQD: 60.0000 mL | Freq: Once | CUTANEOUS | Status: DC

## 2012-11-28 MED ORDER — BUPIVACAINE-EPINEPHRINE 0.5% -1:200000 IJ SOLN
INTRAMUSCULAR | Status: DC | PRN
  Administered 2012-11-28: 20 mL

## 2012-11-28 MED ORDER — LIDOCAINE HCL (CARDIAC) 20 MG/ML IV SOLN
INTRAVENOUS | Status: DC | PRN
  Administered 2012-11-28: 60 mg via INTRAVENOUS

## 2012-11-28 MED ORDER — OXYCODONE HCL 5 MG PO TABS
5.0000 mg | ORAL_TABLET | ORAL | Status: DC | PRN
  Administered 2012-11-28 – 2012-11-29 (×2): 10 mg via ORAL
  Administered 2012-11-29: 5 mg via ORAL
  Administered 2012-11-29 – 2012-11-30 (×4): 10 mg via ORAL
  Filled 2012-11-28 (×3): qty 2
  Filled 2012-11-28: qty 1
  Filled 2012-11-28 (×3): qty 2

## 2012-11-28 MED ORDER — TRAMADOL HCL 50 MG PO TABS
50.0000 mg | ORAL_TABLET | Freq: Four times a day (QID) | ORAL | Status: DC
  Administered 2012-11-28 – 2012-11-30 (×8): 50 mg via ORAL
  Filled 2012-11-28 (×8): qty 1

## 2012-11-28 MED ORDER — ASPIRIN EC 325 MG PO TBEC: 325.0000 mg | DELAYED_RELEASE_TABLET | Freq: Two times a day (BID) | ORAL | Status: DC

## 2012-11-28 MED ORDER — SODIUM CHLORIDE 0.9 % IR SOLN
Status: DC | PRN
  Administered 2012-11-28: 1000 mL

## 2012-11-28 MED ORDER — FLUTICASONE PROPIONATE 50 MCG/ACT NA SUSP: 1.0000 | Freq: Every day | NASAL | Status: DC | PRN

## 2012-11-28 MED ORDER — SENNOSIDES-DOCUSATE SODIUM 8.6-50 MG PO TABS: 1.0000 | ORAL_TABLET | Freq: Every evening | ORAL | Status: DC | PRN

## 2012-11-28 MED ORDER — BUPIVACAINE-EPINEPHRINE (PF) 0.5% -1:200000 IJ SOLN
INTRAMUSCULAR | Status: AC
  Filled 2012-11-28: qty 10

## 2012-11-28 MED ORDER — VITAMIN B-12 1000 MCG PO TABS
1000.0000 ug | ORAL_TABLET | Freq: Every day | ORAL | Status: DC
  Administered 2012-11-28 – 2012-11-30 (×3): 1000 ug via ORAL
  Filled 2012-11-28 (×3): qty 1

## 2012-11-28 MED ORDER — NEOSTIGMINE METHYLSULFATE 1 MG/ML IJ SOLN
INTRAMUSCULAR | Status: DC | PRN
  Administered 2012-11-28: 5.1 mg via INTRAVENOUS

## 2012-11-28 MED ORDER — LACTATED RINGERS IV SOLN
INTRAVENOUS | Status: DC | PRN
  Administered 2012-11-28 (×2): via INTRAVENOUS

## 2012-11-28 MED ORDER — VITAMIN B-12 1000 MCG SL SUBL: 1.0000 | SUBLINGUAL_TABLET | Freq: Every day | SUBLINGUAL | Status: DC

## 2012-11-28 MED ORDER — FENTANYL CITRATE 0.05 MG/ML IJ SOLN
INTRAMUSCULAR | Status: DC | PRN
  Administered 2012-11-28: 50 ug via INTRAVENOUS
  Administered 2012-11-28: 200 ug via INTRAVENOUS

## 2012-11-28 MED ORDER — FUROSEMIDE 40 MG PO TABS
40.0000 mg | ORAL_TABLET | Freq: Every day | ORAL | Status: DC
  Administered 2012-11-28 – 2012-11-30 (×3): 40 mg via ORAL
  Filled 2012-11-28 (×3): qty 1

## 2012-11-28 MED ORDER — ASPIRIN EC 325 MG PO TBEC
325.0000 mg | DELAYED_RELEASE_TABLET | Freq: Every day | ORAL | Status: DC
  Administered 2012-11-29 – 2012-11-30 (×2): 325 mg via ORAL
  Filled 2012-11-28 (×3): qty 1

## 2012-11-28 MED ORDER — HYDROMORPHONE HCL PF 1 MG/ML IJ SOLN
INTRAMUSCULAR | Status: AC
  Administered 2012-11-28: 0.5 mg via INTRAVENOUS
  Filled 2012-11-28: qty 1

## 2012-11-28 MED ORDER — FLEET ENEMA 7-19 GM/118ML RE ENEM: 1.0000 | ENEMA | Freq: Once | RECTAL | Status: AC | PRN

## 2012-11-28 MED ORDER — HYDROCODONE-ACETAMINOPHEN 5-325 MG PO TABS
1.0000 | ORAL_TABLET | Freq: Four times a day (QID) | ORAL | Status: DC | PRN
  Administered 2012-11-28 – 2012-11-29 (×3): 2 via ORAL
  Filled 2012-11-28 (×3): qty 2

## 2012-11-28 MED ORDER — ARTIFICIAL TEARS OP OINT
TOPICAL_OINTMENT | OPHTHALMIC | Status: DC | PRN
  Administered 2012-11-28: 1 via OPHTHALMIC

## 2012-11-28 MED ORDER — MENTHOL 3 MG MT LOZG
1.0000 | LOZENGE | OROMUCOSAL | Status: DC | PRN
  Filled 2012-11-28: qty 9

## 2012-11-28 MED ORDER — ACETAMINOPHEN 325 MG PO TABS
650.0000 mg | ORAL_TABLET | Freq: Four times a day (QID) | ORAL | Status: DC | PRN
  Administered 2012-11-28: 650 mg via ORAL

## 2012-11-28 MED ORDER — METHOCARBAMOL 500 MG PO TABS
500.0000 mg | ORAL_TABLET | Freq: Four times a day (QID) | ORAL | Status: DC | PRN
  Administered 2012-11-28 – 2012-11-29 (×2): 500 mg via ORAL
  Filled 2012-11-28: qty 1

## 2012-11-28 MED ORDER — DOCUSATE SODIUM 100 MG PO CAPS
100.0000 mg | ORAL_CAPSULE | Freq: Two times a day (BID) | ORAL | Status: DC
  Administered 2012-11-28 – 2012-11-30 (×5): 100 mg via ORAL
  Filled 2012-11-28 (×5): qty 1

## 2012-11-28 MED ORDER — MIDAZOLAM HCL 5 MG/5ML IJ SOLN
INTRAMUSCULAR | Status: DC | PRN
  Administered 2012-11-28: 2 mg via INTRAVENOUS

## 2012-11-28 MED ORDER — ONDANSETRON HCL 4 MG/2ML IJ SOLN: 4.0000 mg | Freq: Four times a day (QID) | INTRAMUSCULAR | Status: DC | PRN

## 2012-11-28 MED ORDER — ONDANSETRON HCL 4 MG/2ML IJ SOLN
INTRAMUSCULAR | Status: DC | PRN
  Administered 2012-11-28: 4 mg via INTRAVENOUS

## 2012-11-28 MED ORDER — GLYCOPYRROLATE 0.2 MG/ML IJ SOLN
INTRAMUSCULAR | Status: DC | PRN
  Administered 2012-11-28: .92 mg via INTRAVENOUS

## 2012-11-28 MED ORDER — KCL IN DEXTROSE-NACL 20-5-0.45 MEQ/L-%-% IV SOLN
INTRAVENOUS | Status: AC
  Filled 2012-11-28: qty 1000

## 2012-11-28 MED ORDER — ROCURONIUM BROMIDE 100 MG/10ML IV SOLN
INTRAVENOUS | Status: DC | PRN
  Administered 2012-11-28: 50 mg via INTRAVENOUS

## 2012-11-28 MED ORDER — EPHEDRINE SULFATE 50 MG/ML IJ SOLN
INTRAMUSCULAR | Status: DC | PRN
  Administered 2012-11-28 (×3): 10 mg via INTRAVENOUS
  Administered 2012-11-28: 5 mg via INTRAVENOUS
  Administered 2012-11-28: 15 mg via INTRAVENOUS

## 2012-11-28 MED ORDER — OXYCODONE HCL 5 MG PO TABS
ORAL_TABLET | ORAL | Status: AC
  Administered 2012-11-28: 5 mg via ORAL
  Filled 2012-11-28: qty 1

## 2012-11-28 MED ORDER — ACETAMINOPHEN 325 MG PO TABS
ORAL_TABLET | ORAL | Status: AC
  Administered 2012-11-28: 650 mg via ORAL
  Filled 2012-11-28: qty 2

## 2012-11-28 MED ORDER — METOCLOPRAMIDE HCL 5 MG/ML IJ SOLN
INTRAMUSCULAR | Status: DC | PRN
  Administered 2012-11-28: 10 mg via INTRAVENOUS

## 2012-11-28 MED ORDER — PHENOL 1.4 % MT LIQD: 1.0000 | OROMUCOSAL | Status: DC | PRN

## 2012-11-28 MED ORDER — ONDANSETRON HCL 4 MG PO TABS: 4.0000 mg | ORAL_TABLET | Freq: Four times a day (QID) | ORAL | Status: DC | PRN

## 2012-11-28 MED ORDER — METOCLOPRAMIDE HCL 5 MG/ML IJ SOLN: 5.0000 mg | Freq: Three times a day (TID) | INTRAMUSCULAR | Status: DC | PRN

## 2012-11-28 MED ORDER — CEFUROXIME SODIUM 1.5 G IJ SOLR
INTRAMUSCULAR | Status: AC
  Filled 2012-11-28: qty 1.5

## 2012-11-28 MED ORDER — BISACODYL 5 MG PO TBEC
5.0000 mg | DELAYED_RELEASE_TABLET | Freq: Every day | ORAL | Status: DC | PRN
  Administered 2012-11-30: 5 mg via ORAL
  Filled 2012-11-28: qty 1

## 2012-11-28 MED ORDER — METHOCARBAMOL 500 MG PO TABS
ORAL_TABLET | ORAL | Status: AC
  Administered 2012-11-28: 500 mg via ORAL
  Filled 2012-11-28: qty 1

## 2012-11-28 MED ORDER — CITALOPRAM HYDROBROMIDE 20 MG PO TABS
20.0000 mg | ORAL_TABLET | Freq: Every day | ORAL | Status: DC
  Administered 2012-11-29 – 2012-11-30 (×2): 20 mg via ORAL
  Filled 2012-11-28 (×2): qty 1

## 2012-11-28 MED ORDER — OXYCODONE HCL 5 MG PO TABS
5.0000 mg | ORAL_TABLET | Freq: Once | ORAL | Status: AC | PRN
  Administered 2012-11-28: 5 mg via ORAL

## 2012-11-28 MED ORDER — DEXTROSE-NACL 5-0.45 % IV SOLN: INTRAVENOUS | Status: DC

## 2012-11-28 MED ORDER — ACETAMINOPHEN 650 MG RE SUPP: 650.0000 mg | Freq: Four times a day (QID) | RECTAL | Status: DC | PRN

## 2012-11-28 MED ORDER — HYDROMORPHONE HCL PF 1 MG/ML IJ SOLN
0.5000 mg | INTRAMUSCULAR | Status: DC | PRN
  Administered 2012-11-28 – 2012-11-29 (×3): 0.5 mg via INTRAVENOUS
  Filled 2012-11-28 (×3): qty 1

## 2012-11-28 MED ORDER — METHOCARBAMOL 100 MG/ML IJ SOLN
500.0000 mg | Freq: Four times a day (QID) | INTRAVENOUS | Status: DC | PRN
  Filled 2012-11-28: qty 5

## 2012-11-28 MED ORDER — OXYCODONE-ACETAMINOPHEN 5-325 MG PO TABS: 1.0000 | ORAL_TABLET | ORAL | Status: DC | PRN

## 2012-11-28 MED ORDER — KCL IN DEXTROSE-NACL 20-5-0.45 MEQ/L-%-% IV SOLN
INTRAVENOUS | Status: DC
  Administered 2012-11-28 – 2012-11-29 (×2): via INTRAVENOUS
  Filled 2012-11-28 (×9): qty 1000

## 2012-11-28 MED ORDER — OXYCODONE HCL 5 MG/5ML PO SOLN: 5.0000 mg | Freq: Once | ORAL | Status: AC | PRN

## 2012-11-28 MED ORDER — DEXAMETHASONE SODIUM PHOSPHATE 10 MG/ML IJ SOLN
INTRAMUSCULAR | Status: DC | PRN
  Administered 2012-11-28: 4 mg via INTRAVENOUS

## 2012-11-28 MED ORDER — TRANEXAMIC ACID 100 MG/ML IV SOLN
1000.0000 mg | INTRAVENOUS | Status: AC
  Administered 2012-11-28: 1000 mg via INTRAVENOUS
  Filled 2012-11-28: qty 10

## 2012-11-28 MED ORDER — METOCLOPRAMIDE HCL 10 MG PO TABS: 5.0000 mg | ORAL_TABLET | Freq: Three times a day (TID) | ORAL | Status: DC | PRN

## 2012-11-28 SURGICAL SUPPLY — 52 items
BLADE SAW SGTL 18X1.27X75 (BLADE) ×2 IMPLANT
BRUSH FEMORAL CANAL (MISCELLANEOUS) IMPLANT
CAPT HIP PF COP ×1 IMPLANT
CLOTH BEACON ORANGE TIMEOUT ST (SAFETY) ×2 IMPLANT
COVER BACK TABLE 24X17X13 BIG (DRAPES) IMPLANT
COVER SURGICAL LIGHT HANDLE (MISCELLANEOUS) ×3 IMPLANT
DRAPE ORTHO SPLIT 77X108 STRL (DRAPES) ×2
DRAPE PROXIMA HALF (DRAPES) ×2 IMPLANT
DRAPE SURG ORHT 6 SPLT 77X108 (DRAPES) ×1 IMPLANT
DRAPE U-SHAPE 47X51 STRL (DRAPES) ×2 IMPLANT
DRILL BIT 7/64X5 (BIT) ×2 IMPLANT
DRSG AQUACEL AG ADV 3.5X10 (GAUZE/BANDAGES/DRESSINGS) ×2 IMPLANT
DRSG AQUACEL AG ADV 3.5X14 (GAUZE/BANDAGES/DRESSINGS) ×2 IMPLANT
DURAPREP 26ML APPLICATOR (WOUND CARE) ×2 IMPLANT
ELECT BLADE 4.0 EZ CLEAN MEGAD (MISCELLANEOUS) ×2
ELECT REM PT RETURN 9FT ADLT (ELECTROSURGICAL) ×2
ELECTRODE BLDE 4.0 EZ CLN MEGD (MISCELLANEOUS) ×1 IMPLANT
ELECTRODE REM PT RTRN 9FT ADLT (ELECTROSURGICAL) ×1 IMPLANT
GAUZE XEROFORM 1X8 LF (GAUZE/BANDAGES/DRESSINGS) ×2 IMPLANT
GLOVE BIO SURGEON STRL SZ7.5 (GLOVE) ×2 IMPLANT
GLOVE BIO SURGEON STRL SZ8.5 (GLOVE) ×4 IMPLANT
GLOVE BIOGEL PI IND STRL 8 (GLOVE) ×2 IMPLANT
GLOVE BIOGEL PI IND STRL 9 (GLOVE) ×1 IMPLANT
GLOVE BIOGEL PI INDICATOR 8 (GLOVE) ×2
GLOVE BIOGEL PI INDICATOR 9 (GLOVE) ×1
GOWN PREVENTION PLUS XLARGE (GOWN DISPOSABLE) ×2 IMPLANT
GOWN STRL NON-REIN LRG LVL3 (GOWN DISPOSABLE) ×4 IMPLANT
GOWN STRL REIN XL XLG (GOWN DISPOSABLE) ×4 IMPLANT
HANDPIECE INTERPULSE COAX TIP (DISPOSABLE)
HOOD PEEL AWAY FACE SHEILD DIS (HOOD) ×4 IMPLANT
KIT BASIN OR (CUSTOM PROCEDURE TRAY) ×2 IMPLANT
KIT ROOM TURNOVER OR (KITS) ×2 IMPLANT
MANIFOLD NEPTUNE II (INSTRUMENTS) ×2 IMPLANT
NEEDLE 22X1 1/2 (OR ONLY) (NEEDLE) ×2 IMPLANT
NS IRRIG 1000ML POUR BTL (IV SOLUTION) ×2 IMPLANT
PACK TOTAL JOINT (CUSTOM PROCEDURE TRAY) ×2 IMPLANT
PAD ARMBOARD 7.5X6 YLW CONV (MISCELLANEOUS) ×4 IMPLANT
PASSER SUT SWANSON 36MM LOOP (INSTRUMENTS) ×2 IMPLANT
PRESSURIZER FEMORAL UNIV (MISCELLANEOUS) IMPLANT
SET HNDPC FAN SPRY TIP SCT (DISPOSABLE) IMPLANT
SUT ETHIBOND 2 V 37 (SUTURE) ×2 IMPLANT
SUT ETHILON 3 0 FSL (SUTURE) ×2 IMPLANT
SUT VIC AB 0 CTB1 27 (SUTURE) ×2 IMPLANT
SUT VIC AB 1 CTX 36 (SUTURE) ×2
SUT VIC AB 1 CTX36XBRD ANBCTR (SUTURE) ×1 IMPLANT
SUT VIC AB 2-0 CTB1 (SUTURE) ×2 IMPLANT
SYR CONTROL 10ML LL (SYRINGE) ×2 IMPLANT
TOWEL OR 17X24 6PK STRL BLUE (TOWEL DISPOSABLE) ×2 IMPLANT
TOWEL OR 17X26 10 PK STRL BLUE (TOWEL DISPOSABLE) ×2 IMPLANT
TOWER CARTRIDGE SMART MIX (DISPOSABLE) IMPLANT
TRAY FOLEY CATH 14FR (SET/KITS/TRAYS/PACK) ×1 IMPLANT
WATER STERILE IRR 1000ML POUR (IV SOLUTION) ×2 IMPLANT

## 2012-11-28 NOTE — Interval H&P Note (Signed)
History and Physical Interval Note:  11/28/2012 6:52 AM  Tiffany Gibbs  has presented today for surgery, with the diagnosis of LEFT HIP OSTEOARTHRITIS  The various methods of treatment have been discussed with the patient and family. After consideration of risks, benefits and other options for treatment, the patient has consented to  Procedure(s): TOTAL HIP ARTHROPLASTY (Left) as a surgical intervention .  The patient's history has been reviewed, patient examined, no change in status, stable for surgery.  I have reviewed the patient's chart and labs.  Questions were answered to the patient's satisfaction.     Nestor Lewandowsky

## 2012-11-28 NOTE — Anesthesia Procedure Notes (Signed)
Procedure Name: Intubation Date/Time: 11/28/2012 7:33 AM Performed by: Carmela Rima Pre-anesthesia Checklist: Patient identified, Timeout performed, Emergency Drugs available, Patient being monitored and Suction available Patient Re-evaluated:Patient Re-evaluated prior to inductionOxygen Delivery Method: Circle system utilized Preoxygenation: Pre-oxygenation with 100% oxygen Intubation Type: IV induction Ventilation: Mask ventilation without difficulty Laryngoscope Size: Mac and 3 Grade View: Grade II Tube type: Oral Tube size: 7.5 mm Number of attempts: 1 Placement Confirmation: ETT inserted through vocal cords under direct vision,  positive ETCO2 and breath sounds checked- equal and bilateral Secured at: 22 cm Tube secured with: Tape Dental Injury: Teeth and Oropharynx as per pre-operative assessment

## 2012-11-28 NOTE — Preoperative (Signed)
Beta Blockers   Reason not to administer Beta Blockers:Not Applicable 

## 2012-11-28 NOTE — Plan of Care (Signed)
Problem: Consults Goal: Diagnosis- Total Joint Replacement Outcome: Completed/Met Date Met:  11/28/12 Primary Total Hip Left

## 2012-11-28 NOTE — Op Note (Signed)
OPERATIVE REPORT    DATE OF PROCEDURE:  11/28/2012       PREOPERATIVE DIAGNOSIS:  LEFT HIP OSTEOARTHRITIS                                                          POSTOPERATIVE DIAGNOSIS:  LEFT HIP OSTEOARTHRITIS                                                           PROCEDURE:  L total hip arthroplasty using a 54 mm DePuy Pinnacle  Cup, [employer], 10-degree polyethylene liner index superior  and posterior, a +0 36 mm metal head, a [id number] SROM stem, 18Dsm Sleeve   SURGEON: Ciro Tashiro J    ASSISTANT:   Eric K. [employer]  (present throughout entire procedure and necessary for timely completion of the procedure)   ANESTHESIA: General BLOOD LOSS: 400 FLUID REPLACEMENT: 1500 crystalloid DRAINS: Foley Catheter URINE OUTPUT: 300cc COMPLICATIONS: none    INDICATIONS FOR PROCEDURE: A 67 y.o. year-old With  LEFT HIP OSTEOARTHRITIS   for 2 years, x-rays show bone-on-bone arthritic changes. Despite conservative measures with observation, anti-inflammatory medicine, narcotics, use of a cane, has severe unremitting pain and can ambulate only a few blocks before resting.  Patient desires elective L total hip arthroplasty to decrease pain and increase function. The risks, benefits, and alternatives were discussed at length including but not limited to the risks of infection, bleeding, nerve injury, stiffness, blood clots, the need for revision surgery, cardiopulmonary complications, among others, and they were willing to proceed. Questions answered     PROCEDURE IN DETAIL: The patient was identified by armband,  received preoperative IV antibiotics in the holding area at [facility], taken to the operating room , appropriate anesthetic monitors  were attached and general endotracheal anesthesia induced. Foley catheter was inserted. Pt was rolled into the R lateral decubitus position and fixed there with a Stulberg Mark II pelvic clamp.  The L lower extremity was then  prepped and draped  in the usual sterile fashion from the ankle to the hemipelvis. A time-out  procedure was performed. The skin along the lateral hip and thigh  infiltrated with 10 mL of 0.5% Marcaine and epinephrine solution. We  then made a posterolateral approach to the hip. With a #10 blade, a 15 cm  incision was made through the skin and subcutaneous tissue down to the level of the  IT band. Small bleeders were identified and cauterized. The IT band was cut in  line with skin incision exposing the greater trochanter. A Cobra retractor was placed between the gluteus minimus and the superior hip joint capsule, and a spiked Cobra between the quadratus femoris and the inferior hip joint capsule. This isolated the short  external rotators and piriformis tendons. These were tagged with a #2 Ethibond  suture and cut off their insertion on the intertrochanteric crest. The posterior  capsule was then developed into an acetabular-based flap from Posterior Superior off of the acetabulum out over the femoral neck and back posterior inferior to the acetabular rim. This flap was tagged with two #2 Ethibond  sutures and retracted protecting the sciatic nerve. This exposed the arthritic femoral head and osteophytes. The hip was then flexed and internally rotated, dislocating the femoral head and a standard neck cut performed 1 fingerbreadth above the lesser trochanter.  A spiked Cobra was placed in the cotyloid notch and a Hohmann retractor was then used to lever the femur anteriorly off of the anterior pelvic column. A posterior-inferior wing retractor was placed at the junction of the acetabulum and the ischium completing the acetabular exposure.We then removed the peripheral osteophytes and labrum from the acetabulum. We then reamed the acetabulum up to 53 mm with basket reamers obtaining good coverage in all quadrants. We then irrigated with normal  saline solution and hammered into place a 54 mm pinnacle cup  in 45  degrees of abduction and about 20 degrees of anteversion. More  peripheral osteophytes removed and a trial 10-degree liner placed with the  index superior-posterior. The hip was then flexed and internally rotated exposing the  proximal femur, which was entered with the initiating reamer followed by  the axial reamers up to a 13.5 mm full depth and 14mm partial depth. We then conically reamed to 18D to the correct depth for a 42 base neck. The calcar was milled to 18Dsm. A trial cone and stem was inserted in the 25 degrees anteversion, with a +0 36mm trial head. Trial reduction was then performed and excellent stability was noted with at 90 of flexion with 75 of internal rotation and then full extension with maximal external rotation. The hip could not be dislocated in full extension. The knee could easily flex  to about 130 degrees. We also stretched the abductors at this point,  because of the preexisting adductor contractures. All trial components  were then removed. The acetabulum was irrigated out with normal saline  solution. A titanium [facility] was then screwed into place  followed by a 10-degree polyethylene liner index superior-posterior. On  the femoral side a 18Dsm ZTT1 sleeve was hammered into place, followed by a [id number] SROM stem in 25 degrees of anteversion. At this point, a +0 36 mm ceramic head was  hammered on the stem. The hip was reduced. We checked our stability  one more time and found it to be excellent. The wound was once again  thoroughly irrigated out with normal saline solution pulse lavage. The  capsular flap and short external rotators were repaired back to the  intertrochanteric crest through drill holes with a #2 Ethibond suture.  The IT band was closed with running 1 Vicryl suture. The subcutaneous  tissue with 0 and 2-0 undyed Vicryl suture and the skin with running  interlocking 3-0 nylon suture. Dressing of Xeroform and Mepilex was  then  applied. The patient was then unclamped, rolled supine, awaken extubated and taken to recovery room without difficulty in stable condition.   Valeen Borys J 11/28/2012, 8:47 AM

## 2012-11-28 NOTE — Progress Notes (Signed)
OT Cancellation Note and Discharge  Patient Details Name: [name] MRN: [id number] DOB: 09/23/1945   Cancelled Treatment:    Reason Eval/Treat Not Completed: Other (comment). Pt's plan is to go to SNF for therapy due to she cannot care for herself and present and husband cannot A her. Pt should be able to get to an Independent to Mod I level post SNF stay for BADLs based how she is currently moving with PT. Will defer OT eval to that facility, acute OT will sign off.  Evette Georges [phone] 11/28/2012, 5:00 PM

## 2012-11-28 NOTE — Anesthesia Preprocedure Evaluation (Addendum)
Anesthesia Evaluation  Patient identified by MRN, date of birth, ID band Patient awake    Reviewed: Allergy & Precautions, H&P , NPO status , Patient's Chart, lab work & pertinent test results, reviewed documented beta blocker date and time   Airway Mallampati: II TM Distance: >3 FB Neck ROM: full    Dental  (+) Teeth Intact, Dental Advidsory Given and Caps   Pulmonary sleep apnea and Continuous Positive Airway Pressure Ventilation , former smoker,  breath sounds clear to auscultation        Cardiovascular hypertension, On Medications Rhythm:regular     Neuro/Psych  Headaches, PSYCHIATRIC DISORDERS  Neuromuscular disease    GI/Hepatic negative GI ROS, Neg liver ROS,   Endo/Other  diabetes  Renal/GU negative Renal ROS  negative genitourinary   Musculoskeletal   Abdominal   Peds  Hematology negative hematology ROS (+)   Anesthesia Other Findings See surgeon's H&P   Reproductive/Obstetrics negative OB ROS                          Anesthesia Physical Anesthesia Plan  ASA: III  Anesthesia Plan: General   Post-op Pain Management:    Induction: Intravenous  Airway Management Planned: Oral ETT  Additional Equipment:   Intra-op Plan:   Post-operative Plan: Extubation in OR  Informed Consent: I have reviewed the patients History and Physical, chart, labs and discussed the procedure including the risks, benefits and alternatives for the proposed anesthesia with the patient or authorized representative who has indicated his/her understanding and acceptance.   Dental Advisory Given  Plan Discussed with: CRNA, Surgeon and Anesthesiologist  Anesthesia Plan Comments:        Anesthesia Quick Evaluation

## 2012-11-28 NOTE — Anesthesia Postprocedure Evaluation (Signed)
Anesthesia Post Note  Patient: Tiffany Gibbs  Procedure(s) Performed: Procedure(s) (LRB): TOTAL HIP ARTHROPLASTY (Left)  Anesthesia type: General  Patient location: PACU  Post pain: Pain level controlled  Post assessment: Patient's Cardiovascular Status Stable  Last Vitals:  Filed Vitals:   11/28/12 1100  BP: 125/52  Pulse: 64  Temp: 36.3 C  Resp: 20    Post vital signs: Reviewed and stable  Level of consciousness: alert  Complications: No apparent anesthesia complications

## 2012-11-28 NOTE — Progress Notes (Signed)
UR completed. Deno Sida RN CCM Case Mgmt 

## 2012-11-28 NOTE — Evaluation (Signed)
Physical Therapy Evaluation Patient Details Name: Tiffany Gibbs MRN: [id number] DOB: 06-Sep-1945 Today's Date: 11/28/2012 Time: 7829-5621 PT Time Calculation (min): 15 min  PT Assessment / Plan / Recommendation History of Present Illness  Patient is a 67 yo female s/p Lt THA.  Patient had Rt THA in 2010.  Clinical Impression  Patient presents with problems listed below.  Will benefit from acute PT to maximize independence prior to discharge.  Recommend SNF for continued therapy at discharge.    PT Assessment  Patient needs continued PT services    Follow Up Recommendations  SNF    Does the patient have the potential to tolerate intense rehabilitation      Barriers to Discharge Inaccessible home environment Husband not able to provide physical assist.  Patient to go to [facility] at discharge.    Equipment Recommendations  None recommended by PT    Recommendations for Other Services     Frequency 7X/week    Precautions / Restrictions Precautions Precautions: Posterior Hip;Fall Precaution Booklet Issued: Yes (comment) Precaution Comments: Reviewed hip precautions with patient Restrictions Weight Bearing Restrictions: Yes LLE Weight Bearing: Weight bearing as tolerated   Pertinent Vitals/Pain       Mobility  Bed Mobility Bed Mobility: Supine to Sit;Sitting - Scoot to Edge of Bed Supine to Sit: 3: Mod assist Sitting - Scoot to Edge of Bed: 4: Min assist Details for Bed Mobility Assistance: Verbal cues for technique to maintain hip precautions.  Assist to move LLE off of bed and to raise trunk to sitting.  Good balance in sitting once upright. Transfers Transfers: Sit to Stand;Stand to Sit Sit to Stand: 4: Min assist;With upper extremity assist;From bed Stand to Sit: 4: Min assist;With upper extremity assist;With armrests;To chair/3-in-1 Details for Transfer Assistance: Verbal cues for hand placement and technique.  Assist to rise to standing and for balance  initially.  Cues for placement of hands and of LLE when sitting in chair. Ambulation/Gait Ambulation/Gait Assistance: 4: Min assist Ambulation Distance (Feet): 10 Feet Assistive device: Rolling walker Ambulation/Gait Assistance Details: Verbal cues for safe use of RW and gait sequence.  Patient able to maneuver RW independently. Gait Pattern: Step-to pattern;Decreased stance time - left;Decreased step length - right;Antalgic Gait velocity: Slow gait speed    Exercises Total Joint Exercises Ankle Circles/Pumps: AROM;Both;10 reps;Seated   PT Diagnosis: Difficulty walking;Generalized weakness;Acute pain  PT Problem List: Decreased strength;Decreased range of motion;Decreased activity tolerance;Decreased balance;Decreased mobility;Decreased knowledge of use of DME;Decreased knowledge of precautions;Pain PT Treatment Interventions: DME instruction;Gait training;Functional mobility training;Therapeutic exercise;Patient/family education     PT Goals(Current goals can be found in the care plan section) Acute Rehab PT Goals Patient Stated Goal: To get stronger before going home PT Goal Formulation: With patient Time For Goal Achievement: 12/12/12 Potential to Achieve Goals: Good  Visit Information  Last PT Received On: 11/28/12 Assistance Needed: +1 History of Present Illness: Patient is a 67 yo female s/p Lt THA.  Patient had Rt THA in 2010.       Prior Functioning  Home Living Family/patient expects to be discharged to:: Skilled nursing facility [facility]) Living Arrangements: Spouse/significant other Home Equipment: Dan Humphreys - 2 wheels;Bedside commode Prior Function Level of Independence: Independent Communication Communication: No difficulties    Cognition  Cognition Arousal/Alertness: Awake/alert Behavior During Therapy: WFL for tasks assessed/performed Overall Cognitive Status: Within Functional Limits for tasks assessed    Extremity/Trunk Assessment Upper Extremity  Assessment Upper Extremity Assessment: Overall WFL for tasks assessed Lower Extremity Assessment  Lower Extremity Assessment: LLE deficits/detail LLE Deficits / Details: Decreased strength and ROM due to surgery/pain.  Patient able to assist with moving LLE off of bed. LLE: Unable to fully assess due to pain   Balance    End of Session PT - End of Session Equipment Utilized During Treatment: Gait belt Activity Tolerance: Patient tolerated treatment well;Patient limited by fatigue Patient left: in chair;with call bell/phone within reach;with family/visitor present Nurse Communication: Mobility status  GP     Vena Austria 11/28/2012, 4:57 PM Durenda Hurt. Renaldo Fiddler, [facility] Acute Rehab Services Pager [phone]

## 2012-11-28 NOTE — Transfer of Care (Signed)
Immediate Anesthesia Transfer of Care Note  Patient: Tiffany Gibbs  Procedure(s) Performed: Procedure(s): TOTAL HIP ARTHROPLASTY (Left)  Patient Location: PACU  Anesthesia Type:General  Level of Consciousness: awake, alert  and oriented  Airway & Oxygen Therapy: Patient Spontanous Breathing and Patient connected to nasal cannula oxygen  Post-op Assessment: Report given to PACU RN, Post -op Vital signs reviewed and stable and Patient moving all extremities X 4  Post vital signs: Reviewed and stable  Complications: No apparent anesthesia complications

## 2012-11-29 LAB — BASIC METABOLIC PANEL
BUN: 7 mg/dL (ref 6–23)
Calcium: 8.5 mg/dL (ref 8.4–10.5)
GFR calc non Af Amer: 73 mL/min — ABNORMAL LOW (ref >90)
Glucose, Bld: 133 mg/dL — ABNORMAL HIGH (ref 70–99)

## 2012-11-29 LAB — GLUCOSE, CAPILLARY: Glucose-Capillary: 106 mg/dL — ABNORMAL HIGH (ref 70–99)

## 2012-11-29 LAB — CBC
HCT: 27 % — ABNORMAL LOW (ref 36.0–46.0)
Hemoglobin: 9.7 g/dL — ABNORMAL LOW (ref 12.0–15.0)
MCH: 31.2 pg (ref 26.0–34.0)
MCHC: 35.9 g/dL (ref 30.0–36.0)
MCV: 86.8 fL (ref 78.0–100.0)
Platelets: 169 10*3/uL (ref 150–400)

## 2012-11-29 MED ORDER — HYDROMORPHONE HCL PF 1 MG/ML IJ SOLN
0.5000 mg | INTRAMUSCULAR | Status: DC | PRN
  Administered 2012-11-29 (×2): 0.5 mg via INTRAVENOUS
  Filled 2012-11-29 (×2): qty 1

## 2012-11-29 MED ORDER — HYDROCODONE-ACETAMINOPHEN 5-325 MG PO TABS
1.0000 | ORAL_TABLET | ORAL | Status: DC | PRN
  Administered 2012-11-29 (×2): 2 via ORAL
  Administered 2012-11-30 (×2): 1 via ORAL
  Administered 2012-11-30 (×2): 2 via ORAL
  Filled 2012-11-29: qty 2
  Filled 2012-11-29: qty 1
  Filled 2012-11-29 (×3): qty 2
  Filled 2012-11-29: qty 1

## 2012-11-29 NOTE — Progress Notes (Signed)
Patient ID: Tiffany Gibbs, female   DOB: [birth year], 67 y.o.   MRN: [id number] PATIENT ID: Tiffany Gibbs  MRN: [id number]  DOB/AGE:  [birth year] / 67 y.o.  1 Day Post-Op Procedure(s) (LRB): TOTAL HIP ARTHROPLASTY (Left)    PROGRESS NOTE Subjective: Patient is alert, oriented,no Nausea, no Vomiting, yes passing gas, no Bowel Movement. Taking PO well. Denies SOB, Chest or Calf Pain. Using Incentive Spirometer, PAS in place. Ambulate 10 ft WBAT yesterday Patient reports pain as 2 on 0-10 scale and 3 on 0-10 scale, the patient has been on pain medicine and long-term requires medication every 2 hours alternating hydrocodone and oxycodone  .    Objective: Vital signs in last 24 hours: Filed Vitals:   11/28/12 1815 11/28/12 2045 11/29/12 0200 11/29/12 0600  BP: 109/46 112/48 109/54 100/64  Pulse: 76 80 86 91  Temp: 98.6 F (37 C) 97.8 F (36.6 C) 98.8 F (37.1 C) 98.6 F (37 C)  TempSrc:      Resp: 18 18 18 18   Height:      Weight:      SpO2: 98% 99% 98% 99%      Intake/Output from previous day: I/O last 3 completed shifts: In: 1980 [P.O.:480; I.V.:1500] Out: 3750 [Urine:3500; Blood:250]   Intake/Output this shift:     LABORATORY DATA:  Recent Labs  11/28/12 1640 11/28/12 2131 11/29/12 0700  GLUCAP 195* 114* 137*    Examination: Neurologically intact ABD soft Neurovascular intact Sensation intact distally Intact pulses distally Dorsiflexion/Plantar flexion intact Incision: no drainage No cellulitis present Compartment soft} XR AP&Lat of hip shows well placed\fixed THA  Assessment:   1 Day Post-Op Procedure(s) (LRB): TOTAL HIP ARTHROPLASTY (Left) ADDITIONAL DIAGNOSIS:  Diabetes, Hypertension, Sleep Apnea and Obesity  Plan: PT/OT WBAT, THA  posterior precautions  DVT Prophylaxis: SCDx72 hrs, ASA 325 mg BID x 2 weeks  DISCHARGE PLAN: Skilled Nursing Facility/Rehab, plans to go to [place] [facility]  DISCHARGE NEEDS: HHPT, HHRN, CPM, Walker and 3-in-1 comode  seat

## 2012-11-29 NOTE — Progress Notes (Signed)
Pt asked to speak with charge RN this AM. Pt stated she was unable to wear CPAP machine last night due to it "being too loud and blowing too much air". Pt stated it was very different than her CPAP at home and she was unable to sleep while wearing it. Pt did not wear CPAP at all last night. Pt wants to ensure she will not be charged for the use of CPAP while hospitalized.

## 2012-11-29 NOTE — Progress Notes (Signed)
Physical Therapy Treatment Patient Details Name: Tiffany Gibbs MRN: [id number] DOB: 07/31/1945 Today's Date: 11/29/2012 Time: 9562-1308 PT Time Calculation (min): 24 min  PT Assessment / Plan / Recommendation  History of Present Illness Patient is a 67 yo female s/p Lt THA.  Patient had Rt THA in 2010.   PT Comments   Pt making progress with mobility.  Increased ambulation distance.  Very eager to participate & mobilize.  Cont with current POC.     Follow Up Recommendations  SNF     Does the patient have the potential to tolerate intense rehabilitation     Barriers to Discharge        Equipment Recommendations  None recommended by PT    Recommendations for Other Services    Frequency 7X/week   Progress towards PT Goals Progress towards PT goals: Progressing toward goals  Plan Current plan remains appropriate    Precautions / Restrictions Precautions Precautions: Posterior Hip;Fall Precaution Booklet Issued: Yes (comment) Precaution Comments: Reviewed hip precautions with patient Restrictions LLE Weight Bearing: Weight bearing as tolerated   Pertinent Vitals/Pain C/o Lt hip pain but did not rate.  Repositioned for comfort.      Mobility  Bed Mobility Bed Mobility: Supine to Sit;Sitting - Scoot to Edge of Bed Supine to Sit: HOB flat;With rails;3: Mod assist Sitting - Scoot to Edge of Bed: 4: Min guard Details for Bed Mobility Assistance: (A) for LLE management & to bring shoulders/trunk to sitting upright.  Cues for use of UE's to increase ease of transitional movements Transfers Transfers: Sit to Stand;Stand to Sit Sit to Stand: 4: Min guard;With upper extremity assist;From bed Stand to Sit: 4: Min guard;With upper extremity assist;With armrests;To chair/3-in-1 Details for Transfer Assistance: cues for hand placement & to not bend more than 90 degrees at Lt hip Ambulation/Gait Ambulation/Gait Assistance: 4: Min guard Ambulation Distance (Feet): 60 Feet Assistive  device: Rolling walker Ambulation/Gait Assistance Details: cues for sequencing, safe RW management/advancement, posture, stay inside RW, & encouragement to decrease reliance of UE's on RW.   Gait Pattern: Step-to pattern;Decreased weight shift to left;Decreased step length - right;Antalgic Stairs: No Wheelchair Mobility Wheelchair Mobility: No    Exercises Total Joint Exercises Ankle Circles/Pumps: AROM;Both;10 reps Gluteal Sets: AROM;Strengthening;Both;10 reps Heel Slides: AAROM;Strengthening;Left;10 reps Hip ABduction/ADduction: AAROM;Strengthening;Left;10 reps Long Arc Quad: AROM;Strengthening;Left;10 reps Marching in Standing: Strengthening;Left;10 reps;Seated     PT Goals (current goals can now be found in the care plan section) Acute Rehab PT Goals PT Goal Formulation: With patient Time For Goal Achievement: 12/12/12 Potential to Achieve Goals: Good  Visit Information  Last PT Received On: 11/29/12 Assistance Needed: +1 History of Present Illness: Patient is a 67 yo female s/p Lt THA.  Patient had Rt THA in 2010.    Subjective Data      Cognition  Cognition Arousal/Alertness: Awake/alert Behavior During Therapy: WFL for tasks assessed/performed Overall Cognitive Status: Within Functional Limits for tasks assessed    Balance     End of Session PT - End of Session Activity Tolerance: Patient tolerated treatment well Patient left: in chair;with call bell/phone within reach Nurse Communication: Mobility status   GP     Lara Mulch 11/29/2012, 1:15 PM  Verdell Face, PTA [phone] 11/29/2012

## 2012-11-29 NOTE — Progress Notes (Signed)
Clinical Social Work Department CLINICAL SOCIAL WORK PLACEMENT NOTE 11/29/2012  Patient:  Tiffany Gibbs, Tiffany Gibbs  Account Number:  [id number] Admit date:  11/28/2012  Clinical Social Worker:  Sharol Harness, Theresia Majors  Date/time:  11/29/2012 01:45 PM  Clinical Social Work is seeking post-discharge placement for this patient at the following level of care:   SKILLED NURSING   (*CSW will update this form in Epic as items are completed)   11/29/2012  Patient/family provided with [facility] Department of Clinical Social Work's list of facilities offering this level of care within the geographic area requested by the patient (or if unable, by the patient's family).  11/29/2012  Patient/family informed of their freedom to choose among providers that offer the needed level of care, that participate in Medicare, Medicaid or managed care program needed by the patient, have an available bed and are willing to accept the patient.  11/29/2012  Patient/family informed of [facility]' ownership interest in [facility], as well as of the fact that they are under no obligation to receive care at this facility.  PASARR submitted to [employer]DS on 11/29/2012 PASARR number received from [employer] on 11/29/2012  FL2 transmitted to all facilities in geographic area requested by pt/family on  11/29/2012 FL2 transmitted to all facilities within larger geographic area on   Patient informed that his/her managed care company has contracts with or will negotiate with  certain facilities, including the following:     Patient/family informed of bed offers received:   Patient chooses bed at  Physician recommends and patient chooses bed at    Patient to be transferred to  on   Patient to be transferred to facility by   The following physician request were entered in Epic:   Additional CommentsSharol Harness, LCSWA [phone]

## 2012-11-29 NOTE — Progress Notes (Signed)
Clinical Social Work Department BRIEF PSYCHOSOCIAL ASSESSMENT 11/29/2012  Patient:  Tiffany Gibbs, Tiffany Gibbs     Account Number:  [id number]     Admit date:  11/28/2012  Clinical Social Worker:  Harless Nakayama  Date/Time:  11/29/2012 11:50 AM  Referred by:  Physician  Date Referred:  11/29/2012 Referred for  SNF Placement   Other Referral:   Interview type:  Patient Other interview type:    PSYCHOSOCIAL DATA Living Status:  HUSBAND Admitted from facility:   Level of care:   Primary support name:  Kalyiah Saintil [phone] Primary support relationship to patient:  SPOUSE Degree of support available:   Pt has supportive husband    CURRENT CONCERNS Current Concerns  Post-Acute Placement   Other Concerns:    SOCIAL WORK ASSESSMENT / PLAN CSW aware of PT recommendation for SNF. CSW spoke with pt who informed CSW she has already contacted [facility]. CSW explained SNF referral process but at this time pt is only agreeable to referral being sent to [facility]. CSW will send referral and begin insurance authorization process.   Assessment/plan status:  Psychosocial Support/Ongoing Assessment of Needs Other assessment/ plan:   Information/referral to community resources:   SNF list denied    PATIENT'S/FAMILY'S RESPONSE TO PLAN OF CARE: Pt is agreeable to dc to SNF-[facility].       Jacquelyn Antony, LCSWA [phone]

## 2012-11-29 NOTE — Progress Notes (Signed)
11/29/12 PT recommended SNF. Referral made to CSW. Will continue to folow for discharge needs. Jacquelynn Cree RN, BSN, CCM

## 2012-11-29 NOTE — Progress Notes (Signed)
Patient does not want to wear cpap at this time. 

## 2012-11-30 ENCOUNTER — Encounter (HOSPITAL_COMMUNITY): Payer: Self-pay | Admitting: Orthopedic Surgery

## 2012-11-30 LAB — CBC
HCT: 30.7 % — ABNORMAL LOW (ref 36.0–46.0)
MCV: 88.5 fL (ref 78.0–100.0)
Platelets: 170 10*3/uL (ref 150–400)
RBC: 3.47 MIL/uL — ABNORMAL LOW (ref 3.87–5.11)
WBC: 10.7 10*3/uL — ABNORMAL HIGH (ref 4.0–10.5)

## 2012-11-30 LAB — GLUCOSE, CAPILLARY

## 2012-11-30 MED ORDER — HYDROCODONE-ACETAMINOPHEN 5-325 MG PO TABS: 1.0000 | ORAL_TABLET | Freq: Four times a day (QID) | ORAL | Status: DC | PRN

## 2012-11-30 MED ORDER — TRAMADOL HCL 50 MG PO TABS: 50.0000 mg | ORAL_TABLET | Freq: Three times a day (TID) | ORAL | Status: DC | PRN

## 2012-11-30 MED ORDER — OXYCODONE-ACETAMINOPHEN 5-325 MG PO TABS: 1.0000 | ORAL_TABLET | ORAL | Status: DC | PRN

## 2012-11-30 NOTE — Progress Notes (Addendum)
CSW (Clinical Social Worker) called [employer] and notified of possibility of pt being ready for dc today. Pt has bed available at [facility] when medically ready and insurance auth received.   ADDENDUM: CSW informed that pt has received insurance auth. Will continue to monitor PT and MD notes for decision on discharge.   Dorota Heinrichs, LCSWA [phone]

## 2012-11-30 NOTE — Progress Notes (Signed)
PATIENT ID: Tiffany Gibbs  MRN: [id number]  DOB/AGE:  04-01-1945 / 67 y.o.  2 Days Post-Op Procedure(s) (LRB): TOTAL HIP ARTHROPLASTY (Left)    PROGRESS NOTE Subjective: Patient is alert, oriented,no Nausea, no Vomiting, yes passing gas, no Bowel Movement. Taking PO well. Denies SOB, Chest or Calf Pain. Using Incentive Spirometer, PAS in place. Ambulate WBAT Patient reports pain as moderate  .    Objective: Vital signs in last 24 hours: Filed Vitals:   11/29/12 1600 11/29/12 2108 11/29/12 2146 11/30/12 0556  BP:  117/61  139/63  Pulse:  90 91 94  Temp:  98.8 F (37.1 C)  98.1 F (36.7 C)  TempSrc:      Resp: 18 18 16 16   Height:      Weight:      SpO2: 100% 100% 95% 99%      Intake/Output from previous day: I/O last 3 completed shifts: In: 720 [P.O.:720] Out: 2700 [Urine:2700]   Intake/Output this shift:     LABORATORY DATA:  Recent Labs  11/29/12 0725  11/29/12 1658 11/29/12 2121 11/30/12 0509 11/30/12 0634  WBC 8.1  --   --   --  10.7*  --   HGB 9.7*  --   --   --  10.5*  --   HCT 27.0*  --   --   --  30.7*  --   PLT 169  --   --   --  170  --   NA 135  --   --   --   --   --   K 3.4*  --   --   --   --   --   CL 102  --   --   --   --   --   CO2 24  --   --   --   --   --   BUN 7  --   --   --   --   --   CREATININE 0.81  --   --   --   --   --   GLUCOSE 133*  --   --   --   --   --   GLUCAP  --   < > 128* 106*  --  148*  CALCIUM 8.5  --   --   --   --   --   < > = values in this interval not displayed.  Examination: Neurologically intact Neurovascular intact Sensation intact distally Intact pulses distally Dorsiflexion/Plantar flexion intact Incision: dressing C/D/I and no drainage No cellulitis present Compartment soft} XR AP&Lat of hip shows well placed\fixed THA  Assessment:   2 Days Post-Op Procedure(s) (LRB): TOTAL HIP ARTHROPLASTY (Left) ADDITIONAL DIAGNOSIS:  Diabetes, Hypertension, Sleep Apnea and Obesity  Plan: PT/OT WBAT, THA   posterior precautions  DVT Prophylaxis: SCDx72 hrs, ASA 325 mg BID x 2 weeks  DISCHARGE PLAN: Skilled Nursing Facility/Rehab to [facility] when pt passes PT goals.  DISCHARGE NEEDS: HHPT, HHRN, Walker and 3-in-1 comode seat

## 2012-11-30 NOTE — Discharge Summary (Signed)
Patient ID: Tiffany Gibbs MRN: [id number] DOB/AGE: Jun 08, 1945 67 y.o.  Admit date: 11/28/2012 Discharge date: 11/30/2012  Admission Diagnoses:  Principal Problem:   Osteoarthritis of hip Left   Discharge Diagnoses:  Same  Past Medical History  Diagnosis Date  . Anxiety   . Depression   . HTN (hypertension)   . Osteoarthritis   . DM type 2 (diabetes mellitus, type 2)   . Low back pain   . Osteoporosis   . Hyperlipidemia   . Hives     from tomatoes  . Fibromyalgia   . OSA (obstructive sleep apnea)     cataracts    Surgeries: Procedure(s): TOTAL HIP ARTHROPLASTY on 11/28/2012   Consultants:    Discharged Condition: Improved  Hospital Course: Tiffany Gibbs is an 67 y.o. female who was admitted 11/28/2012 for operative treatment ofOsteoarthritis of hip. Patient has severe unremitting pain that affects sleep, daily activities, and work/hobbies. After pre-op clearance the patient was taken to the operating room on 11/28/2012 and underwent  Procedure(s): TOTAL HIP ARTHROPLASTY.    Patient was given perioperative antibiotics: Anti-infectives   Start     Dose/Rate Route Frequency Ordered Stop   11/27/12 1110  ceFAZolin (ANCEF) IVPB 2 g/50 mL premix  Status:  Discontinued     2 g 100 mL/hr over 30 Minutes Intravenous On call to O.R. 11/27/12 1110 11/28/12 1108       Patient was given sequential compression devices, early ambulation, and chemoprophylaxis to prevent DVT.  Patient benefited maximally from hospital stay and there were no complications.    Recent vital signs: Patient Vitals for the past 24 hrs:  BP Temp Pulse Resp SpO2  11/30/12 1200 - - - 18 -  11/30/12 0800 - - - 18 -  11/30/12 0556 139/63 mmHg 98.1 F (36.7 C) 94 16 99 %  11/29/12 2146 - - 91 16 95 %  11/29/12 2108 117/61 mmHg 98.8 F (37.1 C) 90 18 100 %  11/29/12 1600 - - - 18 100 %     Recent laboratory studies:  Recent Labs  11/29/12 0725 11/30/12 0509  WBC 8.1 10.7*  HGB 9.7* 10.5*   HCT 27.0* 30.7*  PLT 169 170  NA 135  --   K 3.4*  --   CL 102  --   CO2 24  --   BUN 7  --   CREATININE 0.81  --   GLUCOSE 133*  --   CALCIUM 8.5  --      Discharge Medications:     Medication List    STOP taking these medications       ibuprofen 600 MG tablet  Commonly known as:  ADVIL,MOTRIN      TAKE these medications       aspirin EC 325 MG tablet  Take 1 tablet (325 mg total) by mouth 2 (two) times daily.     citalopram 20 MG tablet  Commonly known as:  CELEXA  Take 1 tablet (20 mg total) by mouth daily.     diphenhydrAMINE 25 MG tablet  Commonly known as:  BENADRYL  Take 25 mg by mouth at bedtime.     Fish Oil 1200 MG Caps  Take 2,400 mg by mouth 2 (two) times daily.     fluticasone 50 MCG/ACT nasal spray  Commonly known as:  FLONASE  Place 1 spray into both nostrils daily as needed for allergies or rhinitis.     furosemide 40 MG tablet  Commonly known as:  LASIX  Take 40 mg by mouth daily.     HYDROcodone-acetaminophen 5-325 MG per tablet  Commonly known as:  NORCO/VICODIN  Take 1-2 tablets by mouth 4 (four) times daily as needed for moderate pain (**PT TAKES WITH MELOXICAM AND TRAMADOL TO MANAGE PAIN**).     loratadine 10 MG tablet  Commonly known as:  CLARITIN  Take 10 mg by mouth daily.     meloxicam 15 MG tablet  Commonly known as:  MOBIC  Take 15 mg by mouth daily as needed for pain (**PT TKAES WITH HYDROCODONE AND TRAMADOL TO MANAGE PAIN**).     metFORMIN 500 MG tablet  Commonly known as:  GLUCOPHAGE  Take 500 mg by mouth 2 (two) times daily with a meal.     methocarbamol 500 MG tablet  Commonly known as:  ROBAXIN  Take 1 tablet (500 mg total) by mouth 2 (two) times daily with a meal.     oxyCODONE-acetaminophen 5-325 MG per tablet  Commonly known as:  ROXICET  Take 1-2 tablets by mouth every 4 (four) hours as needed.     traMADol 50 MG tablet  Commonly known as:  ULTRAM  Take 1 tablet (50 mg total) by mouth every 8 (eight)  hours as needed for moderate pain (**PT TAKES WITH HYDROCODONE AND MELOXICAM TO MANAGE PAIN**).     triamcinolone cream 0.5 %  Commonly known as:  KENALOG  Apply 1 application topically 3 (three) times daily as needed (to rash).     Vitamin B-12 1000 MCG Subl  Place 1 tablet under the tongue daily.     Vitamin D 1000 UNITS capsule  Take 1 capsule (1,000 Units total) by mouth daily.        Diagnostic Studies: Dg Pelvis Portable  11/28/2012   CLINICAL DATA:  Left hip replacement.  EXAM: PORTABLE PELVIS 1-2 VIEWS  COMPARISON:  None.  FINDINGS: Single view of the pelvis demonstrates bilateral hip arthroplasties. There is air within the left thigh soft tissues consistent with recent surgery. No evidence for a periprosthetic fracture. Both hip arthroplasties appear to be located.  IMPRESSION: Left hip arthroplasty.  No complicating features.   Electronically Signed   By: Richarda Overlie M.D.   On: 11/28/2012 13:10    Disposition:       Discharge Orders   Future Appointments Provider Department Dept Phone   02/09/2013 10:45 AM Tresa Garter, MD [facility] [phone]   Future Orders Complete By Expires   Call MD / Call 911  As directed    Comments:     If you experience chest pain or shortness of breath, CALL 911 and be transported to the hospital emergency room.  If you develope a fever above 101 F, pus (white drainage) or increased drainage or redness at the wound, or calf pain, call your surgeon's office.   Change dressing  As directed    Comments:     You may change your dressing on day 5, then change the dressing daily with sterile 4 x 4 inch gauze dressing and paper tape.  You may clean the incision with alcohol prior to redressing   Constipation Prevention  As directed    Comments:     Drink plenty of fluids.  Prune juice may be helpful.  You may use a stool softener, such as Colace (over the counter) 100 mg twice a day.  Use MiraLax (over the counter)  for constipation as needed.   Diet - low  sodium heart healthy  As directed    Discharge instructions  As directed    Comments:     Follow up in 2 weeks in office with Dr. Turner Daniels   Driving restrictions  As directed    Comments:     No driving for 2 weeks   Follow the hip precautions as taught in Physical Therapy  As directed    Increase activity slowly as tolerated  As directed    Patient may shower  As directed    Comments:     You may shower without a dressing once there is no drainage.  Do not wash over the wound.  If drainage remains, cover wound with plastic wrap and then shower.      Follow-up Information   Follow up with Nestor Lewandowsky, MD In 2 weeks.   Specialty:  Orthopedic Surgery   Contact information:   [address] [place] [address] [phone]        Signed: Henry Russel 11/30/2012, 2:38 PM

## 2012-11-30 NOTE — Progress Notes (Signed)
Physical Therapy Treatment Patient Details Name: Tiffany Gibbs MRN: [id number] DOB: 02/10/1945 Today's Date: 11/30/2012 Time: 8295-6213 PT Time Calculation (min): 16 min  PT Assessment / Plan / Recommendation  History of Present Illness Patient is a 67 yo female s/p Lt THA.  Patient had Rt THA in 2010.   PT Comments   Pt progressing with mobility.  Very motivated.  Moves well but does require min cueing throughout session for hip precautions.     Follow Up Recommendations  SNF     Does the patient have the potential to tolerate intense rehabilitation     Barriers to Discharge        Equipment Recommendations  None recommended by PT    Recommendations for Other Services    Frequency 7X/week   Progress towards PT Goals Progress towards PT goals: Progressing toward goals  Plan Current plan remains appropriate    Precautions / Restrictions Precautions Precautions: Posterior Hip;Fall Precaution Comments: Reviewed hip precautions.   Restrictions LLE Weight Bearing: Weight bearing as tolerated   Pertinent Vitals/Pain 5/10 Lt hip- incisional pain.  Repositioned for comfort.      Mobility  Bed Mobility Bed Mobility: Supine to Sit;Sitting - Scoot to Edge of Bed Supine to Sit: 4: Min assist;HOB flat Sitting - Scoot to [place] of Bed: 5: Supervision Details for Bed Mobility Assistance: (A) to bring shoulders/trunk to sitting upright Transfers Transfers: Sit to Stand;Stand to Sit Sit to Stand: 4: Min guard;With upper extremity assist;From bed Stand to Sit: 4: Min guard;With upper extremity assist;With armrests;To chair/3-in-1 Details for Transfer Assistance: Pt demonstrated safe hand placement.   Cues for LLE positioning before sitting Ambulation/Gait Ambulation/Gait Assistance: 5: Supervision Ambulation Distance (Feet): 100 Feet Assistive device: Rolling walker Ambulation/Gait Assistance Details: Pt emerging into step-through gait pattern.  Cues for hip precautions with turns.    Gait Pattern: Step-to pattern;Step-through pattern;Decreased stride length;Decreased weight shift to left Stairs: No Wheelchair Mobility Wheelchair Mobility: No    Exercises Total Joint Exercises Ankle Circles/Pumps: AROM;Both;10 reps Heel Slides: AROM;Strengthening;Left;10 reps Hip ABduction/ADduction: AAROM;Strengthening;Left;10 reps Straight Leg Raises: AAROM;Strengthening;Left;10 reps Long Arc Quad: AROM;Strengthening;Left;10 reps Marching in Standing: Strengthening;Left;10 reps;Standing     PT Goals (current goals can now be found in the care plan section) Acute Rehab PT Goals PT Goal Formulation: With patient Time For Goal Achievement: 12/12/12 Potential to Achieve Goals: Good  Visit Information  Last PT Received On: 11/30/12 Assistance Needed: +1 History of Present Illness: Patient is a 67 yo female s/p Lt THA.  Patient had Rt THA in 2010.    Subjective Data      Cognition  Cognition Arousal/Alertness: Awake/alert Behavior During Therapy: WFL for tasks assessed/performed Overall Cognitive Status: Within Functional Limits for tasks assessed    Balance     End of Session PT - End of Session Activity Tolerance: Patient tolerated treatment well Patient left: in chair;with call bell/phone within reach Nurse Communication: Mobility status   GP     Lara Mulch 11/30/2012, 12:01 PM  Verdell Face, PTA [phone] 11/30/2012

## 2012-12-01 ENCOUNTER — Non-Acute Institutional Stay (SKILLED_NURSING_FACILITY): Payer: PRIVATE HEALTH INSURANCE | Admitting: Adult Health

## 2012-12-01 DIAGNOSIS — M25559 Pain in unspecified hip: Secondary | ICD-10-CM

## 2012-12-01 DIAGNOSIS — E119 Type 2 diabetes mellitus without complications: Secondary | ICD-10-CM

## 2012-12-01 DIAGNOSIS — J309 Allergic rhinitis, unspecified: Secondary | ICD-10-CM

## 2012-12-01 DIAGNOSIS — M25552 Pain in left hip: Secondary | ICD-10-CM

## 2012-12-01 DIAGNOSIS — F329 Major depressive disorder, single episode, unspecified: Secondary | ICD-10-CM

## 2012-12-01 DIAGNOSIS — K5900 Constipation, unspecified: Secondary | ICD-10-CM

## 2012-12-01 DIAGNOSIS — M169 Osteoarthritis of hip, unspecified: Secondary | ICD-10-CM

## 2012-12-07 ENCOUNTER — Non-Acute Institutional Stay (SKILLED_NURSING_FACILITY): Payer: PRIVATE HEALTH INSURANCE | Admitting: Internal Medicine

## 2012-12-07 DIAGNOSIS — M1610 Unilateral primary osteoarthritis, unspecified hip: Secondary | ICD-10-CM

## 2012-12-07 DIAGNOSIS — D62 Acute posthemorrhagic anemia: Secondary | ICD-10-CM

## 2012-12-07 DIAGNOSIS — E119 Type 2 diabetes mellitus without complications: Secondary | ICD-10-CM

## 2012-12-07 DIAGNOSIS — I10 Essential (primary) hypertension: Secondary | ICD-10-CM

## 2012-12-07 DIAGNOSIS — M169 Osteoarthritis of hip, unspecified: Secondary | ICD-10-CM

## 2012-12-12 ENCOUNTER — Other Ambulatory Visit: Payer: Self-pay | Admitting: *Deleted

## 2012-12-12 MED ORDER — HYDROCODONE-ACETAMINOPHEN 5-325 MG PO TABS: ORAL_TABLET | ORAL | Status: DC

## 2012-12-13 ENCOUNTER — Non-Acute Institutional Stay (SKILLED_NURSING_FACILITY): Payer: PRIVATE HEALTH INSURANCE | Admitting: Adult Health

## 2012-12-13 DIAGNOSIS — F329 Major depressive disorder, single episode, unspecified: Secondary | ICD-10-CM

## 2012-12-13 DIAGNOSIS — K5900 Constipation, unspecified: Secondary | ICD-10-CM

## 2012-12-13 DIAGNOSIS — J309 Allergic rhinitis, unspecified: Secondary | ICD-10-CM

## 2012-12-13 DIAGNOSIS — M169 Osteoarthritis of hip, unspecified: Secondary | ICD-10-CM

## 2012-12-13 DIAGNOSIS — E119 Type 2 diabetes mellitus without complications: Secondary | ICD-10-CM

## 2012-12-16 DIAGNOSIS — K5900 Constipation, unspecified: Secondary | ICD-10-CM | POA: Insufficient documentation

## 2012-12-16 DIAGNOSIS — M25552 Pain in left hip: Secondary | ICD-10-CM | POA: Insufficient documentation

## 2012-12-16 NOTE — Progress Notes (Signed)
Patient ID: Tiffany Gibbs, female   DOB: October 11, 1945, 67 y.o.   MRN: [id number]                 [facility]                  PROGRESS NOTE  DATE: 12/01/2012  FACILITY: Nursing Home Location: [facility]  LEVEL OF CARE: SNF (31)  Acute Visit  CHIEF COMPLAINT:  Follow-up hospitalization  HISTORY OF PRESENT ILLNESS: This is a 67 year old female who has been admitted to [facility] on 11/30/12 from [facility] with Osteoarthritis S/P Left Total Hip Arthroplasty. She is complaining of moderate pain on her left hip especially during therapy. No redness nor edema noted on left hip surgical site. She has been admitted for a short-term rehabilitation.  REASSESSMENT OF ONGOING PROBLEM(S):  DM:pt's DM remains stable.  Pt denies polyuria, polydipsia, polyphagia, changes in vision or hypoglycemic episodes.  No complications noted from the medication presently being used.   11/14 hemoglobin A1c 5.8  DEPRESSION: The depression remains stable. Patient denies ongoing feelings of sadness, insomnia, anedhonia or lack of appetite. No complications reported from the medications currently being used. Staff do not report behavioral problems.  CONSTIPATION: The constipation remains stable. No complications from the medications presently being used. Patient denies ongoing constipation, abdominal pain, nausea or vomiting.  PAST MEDICAL HISTORY : Reviewed.  No changes.  CURRENT MEDICATIONS: Reviewed per [facility]  REVIEW OF SYSTEMS:  GENERAL: no change in appetite, no fatigue, no weight changes, no fever, chills or weakness RESPIRATORY: no cough, SOB, DOE, wheezing, hemoptysis CARDIAC: no chest pain, edema or palpitations GI: no abdominal pain, diarrhea, constipation, heart burn, nausea or vomiting  PHYSICAL EXAMINATION  VS:  T97.4       P86      RR18      BP108/64         WT190.6 (Lb)  GENERAL: no acute distress, normal body habitus EYES: conjunctivae normal, sclerae  normal, normal eye lids NECK: supple, trachea midline, no neck masses, no thyroid tenderness, no thyromegaly LYMPHATICS: no LAN in the neck, no supraclavicular LAN RESPIRATORY: breathing is even & unlabored, BS CTAB CARDIAC: RRR, no murmur,no extra heart sounds, no edema GI: abdomen soft, normal BS, no masses, no tenderness, no hepatomegaly, no splenomegaly PSYCHIATRIC: the patient is alert & oriented to person, affect & behavior appropriate  LABS/RADIOLOGY: 11/30/12 WBC 10.7 hemoglobin 10.5 hematocrit 30.7 11/29/12 sodium 135 potassium 3.4 glucose 133 BUN 7 creatinine 0.8 calcium 8.5 11/28/12 hemoglobin A1c 5.8    ASSESSMENT/PLAN:  Osteoarthritis status post left total hip arthroplasty - for rehabilitation  Depression -  Continue Celexa  Allergic rhinitis - stable; continue Claritin  Constipation - no complaints  Diabetes mellitus, type II - well controlled; continue metformin  Left hip pain - start Norco 5/325 mg 2 tabs PO Q 6AM, 2PM, and 10PM, Percocet 5/325 mg 1 tab PO Q 10AM, 1PM, 5PM and 12AM; Ultram 50 mg 1 tab PO Q 6 Hours PRN    CPT CODE: 46962

## 2012-12-16 NOTE — Progress Notes (Signed)
Patient ID: ELLAMAY FORS, female   DOB: 10/11/1945, 67 y.o.   MRN: [id number]                    PROGRESS NOTE  DATE: 12/13/2012   FACILITY: [facility]  LEVEL OF CARE: SNF (31)  Acute Visit  CHIEF COMPLAINT:  Discharge Notes  HISTORY OF PRESENT ILLNESS: This is a 67 year old female who is for discharge home with Home health PT. She has been admitted to [facility] on 11/30/12 from [facility] with Osteoarthritis S/P Left Total Hip Arthroplasty. Patient was admitted to this facility for short-term rehabilitation after the patient's recent hospitalization.  Patient has completed SNF rehabilitation and therapy has cleared the patient for discharge.  Reassessment of ongoing problem(s):  DEPRESSION: The depression remains stable. Patient denies ongoing feelings of sadness, insomnia, anedhonia or lack of appetite. No complications reported from the medications currently being used. Staff do not report behavioral problems.  ALLERGIC RHINITIS: Allergic rhinitis remains stable.  Patient denies ongoing symptoms such as runny nose sneezing or tearing. No complications reported from the current medication(s) being used.  DM:pt's DM remains stable.  Pt denies polyuria, polydipsia, polyphagia, changes in vision or hypoglycemic episodes.  No complications noted from the medication presently being used. 11/14 hemoglobin A1c 5.6   PAST MEDICAL HISTORY : Reviewed.  No changes.  CURRENT MEDICATIONS: Reviewed per [facility]  REVIEW OF SYSTEMS:  GENERAL: no change in appetite, no fatigue, no weight changes, no fever, chills or weakness RESPIRATORY: no cough, SOB, DOE, wheezing, hemoptysis CARDIAC: no chest pain, edema or palpitations GI: no abdominal pain, diarrhea, constipation, heart burn, nausea or vomiting  PHYSICAL EXAMINATION  VS:  T97.2      P82       RR18      BP114/66            WT186.6 (Lb)  GENERAL: no acute distress, normal body habitus EYES: conjunctivae  normal, sclerae normal, normal eye lids NECK: supple, trachea midline, no neck masses, no thyroid tenderness, no thyromegaly RESPIRATORY: breathing is even & unlabored, BS CTAB CARDIAC: RRR, no murmur,no extra heart sounds, no edema GI: abdomen soft, normal BS, no masses, no tenderness, no hepatomegaly, no splenomegaly PSYCHIATRIC: the patient is alert & oriented to person, affect & behavior appropriate  LABS/RADIOLOGY: 12/01/12 WBC 7.0 hemoglobin 9.6 hematocrit 30.5 sodium 135 potassium 3.5 glucose 219 BUN 11 creatinine 0.8 calcium 8.8 total protein 5.6 albumin 3.3 hemoglobin A1c 5.6 11/30/12 WBC 10.7 hemoglobin 10.5 hematocrit 30.7 11/29/12 sodium 135 potassium 3.4 glucose 133 BUN 7 creatinine 0.8 calcium 8.5 11/28/12 hemoglobin A1c 5.8    ASSESSMENT/PLAN:  Osteoarthritis status post left total hip arthroplasty - for Home health PT  Depression -  Continue Celexa  Allergic rhinitis - stable; continue Claritin  Constipation - no complaints  Diabetes mellitus, type II - well controlled; continue metformin    I have filled out patient's discharge paperwork and written prescriptions.  Patient will receive home health PT.  Total discharge time: Less than 30 minutes Discharge time involved coordination of the discharge process with Child psychotherapist, nursing staff and therapy department. Medical justification for home health services verified.  CPT CODE: 40981

## 2013-01-19 ENCOUNTER — Encounter: Payer: Self-pay | Admitting: Internal Medicine

## 2013-01-19 DIAGNOSIS — D62 Acute posthemorrhagic anemia: Secondary | ICD-10-CM | POA: Insufficient documentation

## 2013-01-19 NOTE — Progress Notes (Signed)
Patient ID: Tiffany Gibbs, female   DOB: 01/19/1945, 68 y.o.   MRN: [id number]               HISTORY & PHYSICAL  DATE: 12/07/2012     FACILITY: [facility]  LEVEL OF CARE: SNF (31)  ALLERGIES:  Allergies  Allergen Reactions  . Celecoxib     swell  . Enalapril     cough  . Losartan     falls  . Lovastatin     REACTION: aches  . Naproxen     CHIEF COMPLAINT:  Manage left hip osteoarthritis, diabetes mellitus, and hypertension.    HISTORY OF PRESENT ILLNESS:  The patient is a 68 year-old, Caucasian female.    HIP OSTEOARTHRITIS: patient had advanced end stage OA of the hip with progressively worsening pain & dysfunction.  Pt failed non-surgical conservative management.  Therefore pt underwent total hip arthroplasty & tolerated the procedure well.  Pt denies hip pain currently.  Pt was admitted to this facility for short term rehabilitation.    HTN: Pt 's HTN remains stable.  Denies CP, sob, DOE, pedal edema, headaches, dizziness or visual disturbances.  No complications from the medications currently being used.  Last BP :   126/68.    DM:pt's DM remains stable.  Pt denies polyuria, polydipsia, polyphagia, changes in vision or hypoglycemic episodes.  No complications noted from the medication presently being used.  Last hemoglobin A1c is:  Not available.    PAST MEDICAL HISTORY :  Past Medical History  Diagnosis Date  . Anxiety   . Depression   . HTN (hypertension)   . Osteoarthritis   . DM type 2 (diabetes mellitus, type 2)   . Low back pain   . Osteoporosis   . Hyperlipidemia   . Hives     from tomatoes  . Fibromyalgia   . OSA (obstructive sleep apnea)     cataracts    PAST SURGICAL HISTORY: Past Surgical History  Procedure Laterality Date  . Total abdominal hysterectomy    . Cervical cryotherapy      cervix  . 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 Lasik Bilateral   . Eye surgery    . Total hip arthroplasty Left 11/28/2012   Procedure: TOTAL HIP ARTHROPLASTY;  Surgeon: Kerin Salen, MD;  Location: [facility];  Service: Orthopedics;  Laterality: Left;    SOCIAL HISTORY:  reports that she quit smoking about 30 years ago. Her smoking use included Cigarettes. She has a 50 pack-year smoking history. She has never used smokeless tobacco. She reports that she does not drink alcohol or use illicit drugs.  FAMILY HISTORY:  Family History  Problem Relation Age of Onset  . Hypertension Mother   . Heart disease Father   . Multiple sclerosis Father   . Heart attack Father     CURRENT MEDICATIONS: Reviewed per [facility]  REVIEW OF SYSTEMS:  See HPI otherwise 14 point ROS is negative.  PHYSICAL EXAMINATION  VS:  T 97.7       P 76      RR 18      BP 126/68      POX 97%        WT (Lb)  GENERAL: no acute distress, normal body habitus EYES: conjunctivae normal, sclerae normal, normal eye lids MOUTH/THROAT: lips without lesions,no lesions in the mouth,tongue is without lesions,uvula elevates in midline NECK: supple, trachea midline, no neck masses,  no thyroid tenderness, no thyromegaly LYMPHATICS: no LAN in the neck, no supraclavicular LAN RESPIRATORY: breathing is even & unlabored, BS CTAB CARDIAC: RRR, no murmur,no extra heart sounds, no edema GI:  ABDOMEN: abdomen soft, normal BS, no masses, no tenderness  LIVER/SPLEEN: no hepatomegaly, no splenomegaly MUSCULOSKELETAL: HEAD: normal to inspection & palpation BACK: no kyphosis, scoliosis or spinal processes tenderness EXTREMITIES: LEFT UPPER EXTREMITY: full range of motion, normal strength & tone RIGHT UPPER EXTREMITY:  full range of motion, normal strength & tone LEFT LOWER EXTREMITY: strength intact, range of motion not tested due to surgery   RIGHT LOWER EXTREMITY: strength intact, range of motion moderate   PSYCHIATRIC: the patient is alert & oriented to person, affect & behavior appropriate  LABS/RADIOLOGY: Pelvic x-ray:  Showed left hip arthroplasty.  No  complicating features.    Labs reviewed: Basic Metabolic Panel:  Recent Labs  08/29/12 1126 11/21/12 1145 11/29/12 0725  NA 139 137 135  K 3.7 3.9 3.4*  CL 104 100 102  CO2 27 27 24   GLUCOSE 109* 85 133*  BUN 21 21 7   CREATININE 1.0 0.93 0.81  CALCIUM 9.0 9.8 8.5   CBC:  Recent Labs  11/21/12 1145 11/29/12 0725 11/30/12 0509  WBC 6.2 8.1 10.7*  NEUTROABS 2.8  --   --   HGB 14.5 9.7* 10.5*  HCT 41.1 27.0* 30.7*  MCV 86.7 86.8 88.5  PLT 254 169 170   Lipid Panel:  Recent Labs  03/23/12 1030  HDL 46.10    Recent Labs  11/30/12 0634 11/30/12 1150 11/30/12 1621  GLUCAP 148* 121* 183*    ASSESSMENT/PLAN:  Left hip osteoarthritis.  Status post left total hip arthroplasty.  Continue rehabilitation.    Hypertension.  Well controlled.    Diabetes mellitus.  Check hemoglobin A1c.    Acute blood loss anemia.  Reassess hemoglobin level.    Allergic rhinitis.  Continue Claritin.    Check CBC and CMP.     THN Metrics:   Aspirin 325 mg.  Former smoker.   BP:  126/68.    I have reviewed patient's medical records received at admission/from hospitalization.  CPT CODE: 94503

## 2013-01-20 ENCOUNTER — Telehealth: Payer: Self-pay | Admitting: *Deleted

## 2013-01-20 DIAGNOSIS — Z0000 Encounter for general adult medical examination without abnormal findings: Secondary | ICD-10-CM

## 2013-01-20 DIAGNOSIS — E785 Hyperlipidemia, unspecified: Secondary | ICD-10-CM

## 2013-01-20 DIAGNOSIS — E119 Type 2 diabetes mellitus without complications: Secondary | ICD-10-CM

## 2013-01-20 NOTE — Telephone Encounter (Signed)
Pt is scheduled to see PCP 02/09/2013 & is calling requesting that her labs be drawn PRIOR to appt rather than day of.  She wishes for labs to be drawn with results available to MD by day of OV.  Please advise.   CB#[phone]

## 2013-01-20 NOTE — Telephone Encounter (Signed)
Ok A1c cbc cmet tsh lipids ua Thx

## 2013-01-23 NOTE — Telephone Encounter (Signed)
Left voicemail message for patient notifying her of MD response and scheduled labs.

## 2013-02-03 ENCOUNTER — Ambulatory Visit: Payer: Medicare HMO

## 2013-02-03 DIAGNOSIS — Z0000 Encounter for general adult medical examination without abnormal findings: Secondary | ICD-10-CM

## 2013-02-03 DIAGNOSIS — E785 Hyperlipidemia, unspecified: Secondary | ICD-10-CM

## 2013-02-03 DIAGNOSIS — E119 Type 2 diabetes mellitus without complications: Secondary | ICD-10-CM

## 2013-02-03 LAB — LIPID PANEL
Cholesterol: 232 mg/dL — ABNORMAL HIGH (ref 0–200)
HDL: 60.3 mg/dL (ref >39.00)
TRIGLYCERIDES: 132 mg/dL (ref 0.0–149.0)
Total CHOL/HDL Ratio: 4
VLDL: 26.4 mg/dL (ref 0.0–40.0)

## 2013-02-03 LAB — URINALYSIS
Ketones, ur: NEGATIVE
Leukocytes, UA: NEGATIVE
Nitrite: NEGATIVE
Specific Gravity, Urine: 1.02 (ref 1.000–1.030)
Total Protein, Urine: NEGATIVE
URINE GLUCOSE: NEGATIVE
UROBILINOGEN UA: 0.2 (ref 0.0–1.0)
pH: 6.5 (ref 5.0–8.0)

## 2013-02-03 LAB — COMPREHENSIVE METABOLIC PANEL
ALT: 24 U/L (ref 0–35)
AST: 26 U/L (ref 0–37)
Albumin: 4.1 g/dL (ref 3.5–5.2)
Alkaline Phosphatase: 85 U/L (ref 39–117)
BILIRUBIN TOTAL: 0.6 mg/dL (ref 0.3–1.2)
BUN: 17 mg/dL (ref 6–23)
CO2: 27 mEq/L (ref 19–32)
Calcium: 9.7 mg/dL (ref 8.4–10.5)
Chloride: 106 mEq/L (ref 96–112)
Creatinine, Ser: 0.8 mg/dL (ref 0.4–1.2)
GFR: 71.75 mL/min (ref >60.00)
GLUCOSE: 89 mg/dL (ref 70–99)
Potassium: 4.1 mEq/L (ref 3.5–5.1)
SODIUM: 141 meq/L (ref 135–145)
TOTAL PROTEIN: 7.7 g/dL (ref 6.0–8.3)

## 2013-02-03 LAB — CBC
HEMATOCRIT: 41.6 % (ref 36.0–46.0)
Hemoglobin: 14.1 g/dL (ref 12.0–15.0)
MCHC: 34 g/dL (ref 30.0–36.0)
MCV: 89.1 fl (ref 78.0–100.0)
Platelets: 208 10*3/uL (ref 150.0–400.0)
RBC: 4.67 Mil/uL (ref 3.87–5.11)
RDW: 13.3 % (ref 11.5–14.6)
WBC: 5.4 10*3/uL (ref 4.5–10.5)

## 2013-02-03 LAB — LDL CHOLESTEROL, DIRECT: Direct LDL: 148.2 mg/dL

## 2013-02-03 LAB — TSH: TSH: 1.07 u[IU]/mL (ref 0.35–5.50)

## 2013-02-03 LAB — HEMOGLOBIN A1C: HEMOGLOBIN A1C: 5.3 % (ref 4.6–6.5)

## 2013-02-09 ENCOUNTER — Ambulatory Visit (INDEPENDENT_AMBULATORY_CARE_PROVIDER_SITE_OTHER): Payer: Managed Care, Other (non HMO) | Admitting: Internal Medicine

## 2013-02-09 ENCOUNTER — Encounter: Payer: Self-pay | Admitting: Internal Medicine

## 2013-02-09 VITALS — BP 140/88 | HR 76 | Temp 98.1°F | Resp 16 | Wt 181.0 lb

## 2013-02-09 DIAGNOSIS — E785 Hyperlipidemia, unspecified: Secondary | ICD-10-CM

## 2013-02-09 DIAGNOSIS — F3289 Other specified depressive episodes: Secondary | ICD-10-CM

## 2013-02-09 DIAGNOSIS — M1610 Unilateral primary osteoarthritis, unspecified hip: Secondary | ICD-10-CM

## 2013-02-09 DIAGNOSIS — IMO0001 Reserved for inherently not codable concepts without codable children: Secondary | ICD-10-CM

## 2013-02-09 DIAGNOSIS — M810 Age-related osteoporosis without current pathological fracture: Secondary | ICD-10-CM

## 2013-02-09 DIAGNOSIS — M797 Fibromyalgia: Secondary | ICD-10-CM

## 2013-02-09 DIAGNOSIS — L84 Corns and callosities: Secondary | ICD-10-CM

## 2013-02-09 DIAGNOSIS — I10 Essential (primary) hypertension: Secondary | ICD-10-CM

## 2013-02-09 DIAGNOSIS — E538 Deficiency of other specified B group vitamins: Secondary | ICD-10-CM

## 2013-02-09 DIAGNOSIS — E119 Type 2 diabetes mellitus without complications: Secondary | ICD-10-CM

## 2013-02-09 DIAGNOSIS — M169 Osteoarthritis of hip, unspecified: Secondary | ICD-10-CM

## 2013-02-09 DIAGNOSIS — E669 Obesity, unspecified: Secondary | ICD-10-CM

## 2013-02-09 DIAGNOSIS — F329 Major depressive disorder, single episode, unspecified: Secondary | ICD-10-CM

## 2013-02-09 MED ORDER — HYDROCODONE-ACETAMINOPHEN 7.5-325 MG PO TABS: 1.0000 | ORAL_TABLET | Freq: Four times a day (QID) | ORAL | Status: DC | PRN

## 2013-02-09 MED ORDER — METHOCARBAMOL 500 MG PO TABS: 500.0000 mg | ORAL_TABLET | Freq: Two times a day (BID) | ORAL | Status: DC

## 2013-02-09 NOTE — Assessment & Plan Note (Signed)
Continue with current prescription therapy as reflected on the Med list.  

## 2013-02-09 NOTE — Assessment & Plan Note (Signed)
2014 THR Doing better

## 2013-02-09 NOTE — Assessment & Plan Note (Signed)
Since 2002 S/p rhematol eval - negative 8/13 she stopped tramadol and Flexeril 2012 resolved post-retirement

## 2013-02-09 NOTE — Progress Notes (Deleted)
Pre visit review using our clinic review tool, if applicable. No additional management support is needed unless otherwise documented below in the visit note. 

## 2013-02-09 NOTE — Assessment & Plan Note (Signed)
Vit D 

## 2013-02-09 NOTE — Progress Notes (Signed)
Subjective:     HPI  F/u falls on Losartan - resolved after she stopped it Hip pain is better s/p L THR on Nov 2014 Dr Mayer Camel is planned. Pain is much better on current rx Norco and occasional Tramadol  F/u diarrhea - resolved after she started to take metformin at hs - resolved   F/u  insomnia - better.  The patient presents for a follow-up of  chronic hypertension, chronic dyslipidemia, pains, type 2 diabetes controlled with medicines.  F/u on memory issues better off flexeril and Tramadol C/o a painful callus R index finger   BP Readings from Last 3 Encounters:  02/09/13 140/88  12/07/12 126/68  11/30/12 139/63   Wt Readings from Last 3 Encounters:  02/09/13 181 lb (82.101 kg)  11/28/12 187 lb 6.3 oz (85 kg)  11/28/12 187 lb 6.3 oz (85 kg)    Review of Systems  Constitutional: Negative for activity change, appetite change, fatigue and unexpected weight change.  HENT: Negative for congestion, mouth sores and sinus pressure.   Eyes: Negative for visual disturbance.  Respiratory: Negative for chest tightness.   Gastrointestinal: Negative for nausea.  Genitourinary: Negative for frequency, difficulty urinating and vaginal pain.  Musculoskeletal: Negative for back pain and gait problem.  Skin: Negative for pallor.  Neurological: Negative for dizziness, tremors, weakness and numbness.  Psychiatric/Behavioral: Negative for confusion and sleep disturbance. The patient is not nervous/anxious.        Objective:   Physical Exam  Constitutional: She appears well-developed and well-nourished. No distress.  HENT:  Head: Normocephalic.  Right Ear: External ear normal.  Left Ear: External ear normal.  Nose: Nose normal.  Mouth/Throat: Oropharynx is clear and moist.  Eyes: Conjunctivae are normal. Pupils are equal, round, and reactive to light. Right eye exhibits no discharge. Left eye exhibits no discharge.  Neck: Normal range of motion. Neck supple. No JVD present. No  tracheal deviation present. No thyromegaly present.  Cardiovascular: Normal rate, regular rhythm and normal heart sounds.   Pulmonary/Chest: No stridor. No respiratory distress. She has no wheezes.  Abdominal: Soft. Bowel sounds are normal. She exhibits no distension and no mass. There is no tenderness. There is no rebound and no guarding.  Musculoskeletal: She exhibits no edema and no tenderness.  Lymphadenopathy:    She has no cervical adenopathy.  Neurological: She displays normal reflexes. No cranial nerve deficit. She exhibits normal muscle tone. Coordination normal.  Skin: No rash noted. No erythema.  Psychiatric: She has a normal mood and affect. Her behavior is normal. Judgment and thought content normal.  L hip is less tender; no cane   Lab Results  Component Value Date   WBC 5.4 02/03/2013   HGB 14.1 02/03/2013   HCT 41.6 02/03/2013   PLT 208.0 02/03/2013   GLUCOSE 89 02/03/2013   CHOL 232* 02/03/2013   TRIG 132.0 02/03/2013   HDL 60.30 02/03/2013   LDLDIRECT 148.2 02/03/2013   LDLCALC 114* 12/22/2011   ALT 24 02/03/2013   AST 26 02/03/2013   NA 141 02/03/2013   K 4.1 02/03/2013   CL 106 02/03/2013   CREATININE 0.8 02/03/2013   BUN 17 02/03/2013   CO2 27 02/03/2013   TSH 1.07 02/03/2013   INR 1.14 11/21/2012   HGBA1C 5.3 02/03/2013    Procedure:   R index finger callus paring/cutting  Indication:  R index finger callus, painful  Consent: verbal  Risks and benefits were explained to the patient. Skin was cleaned with alcohol. I  removed a large callus carefully with a round blade. Skin remained intact. Pain is better. Tolerated well. Complications: none. Bandaid applied       Assessment & Plan:

## 2013-02-09 NOTE — Assessment & Plan Note (Signed)
Wt Readings from Last 3 Encounters:  02/09/13 181 lb (82.101 kg)  11/28/12 187 lb 6.3 oz (85 kg)  11/28/12 187 lb 6.3 oz (85 kg)

## 2013-02-09 NOTE — Assessment & Plan Note (Signed)
See procedure 

## 2013-02-10 ENCOUNTER — Telehealth: Payer: Self-pay

## 2013-02-10 ENCOUNTER — Telehealth: Payer: Self-pay | Admitting: Internal Medicine

## 2013-02-10 NOTE — Telephone Encounter (Signed)
Relevant patient education assigned to patient using Emmi. ° °

## 2013-02-20 ENCOUNTER — Telehealth: Payer: Self-pay | Admitting: *Deleted

## 2013-02-20 MED ORDER — TIZANIDINE HCL 4 MG PO TABS: 4.0000 mg | ORAL_TABLET | Freq: Three times a day (TID) | ORAL | Status: DC | PRN

## 2013-02-20 NOTE — Telephone Encounter (Signed)
Ok Thx 

## 2013-02-20 NOTE — Telephone Encounter (Signed)
Phoned patient to update med status.  She apologized for mis-communicating request.  Further clarified that she CAN take the robaxin, PCP needs to complete prior authorization confirming the long term need for med.  Please advise.  Prior Auth # [id number] [employer]  Member ID #[id number]   CB# [phone]

## 2013-02-20 NOTE — Telephone Encounter (Signed)
I emailed Tizanidine Rx instead Thx

## 2013-02-20 NOTE — Telephone Encounter (Signed)
Patient phoned triage line this morning at 0920 stating that robaxin (prescribed 02/09/13) is not covered under her [place].  Please advise.   CB# [phone]

## 2013-05-01 ENCOUNTER — Telehealth: Payer: Self-pay | Admitting: Internal Medicine

## 2013-05-01 NOTE — Telephone Encounter (Signed)
Patient is calling to see if she can go have her blood work before her appointment on 05/10/13. If so, she just needs orders entered. Please advise.

## 2013-05-02 NOTE — Telephone Encounter (Signed)
Left mess for patient to call back.  

## 2013-05-04 ENCOUNTER — Other Ambulatory Visit: Payer: Self-pay | Admitting: *Deleted

## 2013-05-04 ENCOUNTER — Other Ambulatory Visit (INDEPENDENT_AMBULATORY_CARE_PROVIDER_SITE_OTHER): Payer: Managed Care, Other (non HMO)

## 2013-05-04 DIAGNOSIS — E119 Type 2 diabetes mellitus without complications: Secondary | ICD-10-CM

## 2013-05-04 LAB — HEMOGLOBIN A1C: Hgb A1c MFr Bld: 5.4 % (ref 4.6–6.5)

## 2013-05-10 ENCOUNTER — Telehealth: Payer: Self-pay | Admitting: Internal Medicine

## 2013-05-10 ENCOUNTER — Encounter: Payer: Self-pay | Admitting: Internal Medicine

## 2013-05-10 ENCOUNTER — Ambulatory Visit (INDEPENDENT_AMBULATORY_CARE_PROVIDER_SITE_OTHER): Payer: Managed Care, Other (non HMO) | Admitting: Internal Medicine

## 2013-05-10 VITALS — BP 118/70 | HR 80 | Temp 99.0°F | Resp 16 | Wt 174.0 lb

## 2013-05-10 DIAGNOSIS — I10 Essential (primary) hypertension: Secondary | ICD-10-CM

## 2013-05-10 DIAGNOSIS — E538 Deficiency of other specified B group vitamins: Secondary | ICD-10-CM

## 2013-05-10 DIAGNOSIS — M79669 Pain in unspecified lower leg: Secondary | ICD-10-CM

## 2013-05-10 DIAGNOSIS — IMO0001 Reserved for inherently not codable concepts without codable children: Secondary | ICD-10-CM

## 2013-05-10 DIAGNOSIS — E119 Type 2 diabetes mellitus without complications: Secondary | ICD-10-CM

## 2013-05-10 DIAGNOSIS — M797 Fibromyalgia: Secondary | ICD-10-CM

## 2013-05-10 DIAGNOSIS — M25571 Pain in right ankle and joints of right foot: Secondary | ICD-10-CM | POA: Insufficient documentation

## 2013-05-10 DIAGNOSIS — M79609 Pain in unspecified limb: Secondary | ICD-10-CM

## 2013-05-10 MED ORDER — FUROSEMIDE 40 MG PO TABS: 40.0000 mg | ORAL_TABLET | Freq: Every day | ORAL | Status: DC

## 2013-05-10 MED ORDER — CITALOPRAM HYDROBROMIDE 20 MG PO TABS: 20.0000 mg | ORAL_TABLET | Freq: Every day | ORAL | Status: DC

## 2013-05-10 NOTE — Assessment & Plan Note (Signed)
Doing well 

## 2013-05-10 NOTE — Progress Notes (Signed)
Pre visit review using our clinic review tool, if applicable. No additional management support is needed unless otherwise documented below in the visit note. 

## 2013-05-10 NOTE — Assessment & Plan Note (Signed)
Re-start therapy as reflected on the Med list.

## 2013-05-10 NOTE — Progress Notes (Signed)
   Subjective:     HPI  C/o R shin cramps x 4-5 d. No injury C/o toes being numb B feet F/u falls on Losartan - resolved after she stopped it Hip pain is better s/p L THR on Nov 2014 Dr Mayer Camel is planned. Pain is much better off Rx F/u diarrhea - resolved after she started to take metformin at hs - resolved   F/u  insomnia - better.  The patient presents for a follow-up of  chronic hypertension, chronic dyslipidemia, pains, type 2 diabetes controlled with medicines.  F/u on memory issues better off flexeril and Tramadol C/o a painful callus R index finger   BP Readings from Last 3 Encounters:  05/10/13 118/70  02/09/13 140/88  12/07/12 126/68   Wt Readings from Last 3 Encounters:  05/10/13 174 lb (78.926 kg)  02/09/13 181 lb (82.101 kg)  11/28/12 187 lb 6.3 oz (85 kg)    Review of Systems  Constitutional: Negative for activity change, appetite change, fatigue and unexpected weight change.  HENT: Negative for congestion, mouth sores and sinus pressure.   Eyes: Negative for visual disturbance.  Respiratory: Negative for chest tightness.   Gastrointestinal: Negative for nausea.  Genitourinary: Negative for frequency, difficulty urinating and vaginal pain.  Musculoskeletal: Negative for back pain and gait problem.  Skin: Negative for pallor.  Neurological: Negative for dizziness, tremors, weakness and numbness.  Psychiatric/Behavioral: Negative for confusion and sleep disturbance. The patient is not nervous/anxious.        Objective:   Physical Exam  Constitutional: She appears well-developed and well-nourished. No distress.  HENT:  Head: Normocephalic.  Right Ear: External ear normal.  Left Ear: External ear normal.  Nose: Nose normal.  Mouth/Throat: Oropharynx is clear and moist.  Eyes: Conjunctivae are normal. Pupils are equal, round, and reactive to light. Right eye exhibits no discharge. Left eye exhibits no discharge.  Neck: Normal range of motion. Neck supple.  No JVD present. No tracheal deviation present. No thyromegaly present.  Cardiovascular: Normal rate, regular rhythm and normal heart sounds.   Pulmonary/Chest: No stridor. No respiratory distress. She has no wheezes.  Abdominal: Soft. Bowel sounds are normal. She exhibits no distension and no mass. There is no tenderness. There is no rebound and no guarding.  Musculoskeletal: She exhibits no edema and no tenderness.  Lymphadenopathy:    She has no cervical adenopathy.  Neurological: She displays normal reflexes. No cranial nerve deficit. She exhibits normal muscle tone. Coordination normal.  Skin: No rash noted. No erythema.  Psychiatric: She has a normal mood and affect. Her behavior is normal. Judgment and thought content normal.  L hip is less tender; no cane r posterior shin is tender B toes #1-2 are crossed   Lab Results  Component Value Date   WBC 5.4 02/03/2013   HGB 14.1 02/03/2013   HCT 41.6 02/03/2013   PLT 208.0 02/03/2013   GLUCOSE 89 02/03/2013   CHOL 232* 02/03/2013   TRIG 132.0 02/03/2013   HDL 60.30 02/03/2013   LDLDIRECT 148.2 02/03/2013   LDLCALC 114* 12/22/2011   ALT 24 02/03/2013   AST 26 02/03/2013   NA 141 02/03/2013   K 4.1 02/03/2013   CL 106 02/03/2013   CREATININE 0.8 02/03/2013   BUN 17 02/03/2013   CO2 27 02/03/2013   TSH 1.07 02/03/2013   INR 1.14 11/21/2012   HGBA1C 5.4 05/04/2013       Assessment & Plan:

## 2013-05-10 NOTE — Telephone Encounter (Signed)
Relevant patient education assigned to patient using Emmi. ° °

## 2013-05-10 NOTE — Assessment & Plan Note (Signed)
Continue with current prescription therapy as reflected on the Med list.  

## 2013-05-10 NOTE — Assessment & Plan Note (Signed)
Venous doppler

## 2013-05-18 ENCOUNTER — Ambulatory Visit (HOSPITAL_COMMUNITY): Payer: Medicare HMO | Attending: Internal Medicine | Admitting: Cardiology

## 2013-05-18 DIAGNOSIS — M79669 Pain in unspecified lower leg: Secondary | ICD-10-CM

## 2013-05-18 DIAGNOSIS — E785 Hyperlipidemia, unspecified: Secondary | ICD-10-CM | POA: Insufficient documentation

## 2013-05-18 DIAGNOSIS — I10 Essential (primary) hypertension: Secondary | ICD-10-CM | POA: Insufficient documentation

## 2013-05-18 DIAGNOSIS — M79609 Pain in unspecified limb: Secondary | ICD-10-CM | POA: Insufficient documentation

## 2013-05-18 DIAGNOSIS — Z87891 Personal history of nicotine dependence: Secondary | ICD-10-CM | POA: Insufficient documentation

## 2013-05-18 DIAGNOSIS — E119 Type 2 diabetes mellitus without complications: Secondary | ICD-10-CM | POA: Insufficient documentation

## 2013-05-18 NOTE — Progress Notes (Signed)
Lower venous duplex unilateral completed. 

## 2013-08-15 ENCOUNTER — Telehealth: Payer: Self-pay | Admitting: Internal Medicine

## 2013-08-15 DIAGNOSIS — E785 Hyperlipidemia, unspecified: Secondary | ICD-10-CM

## 2013-08-15 NOTE — Telephone Encounter (Signed)
Changing plan, pt stated she has to come up here early morning. Please advise.

## 2013-08-15 NOTE — Telephone Encounter (Signed)
Pt request cholesterol to be add on to lab order. Please call pt if this is ok. Pt stated Dr. Camila Li didn't do her cholesterol last time and she is plan on coming to do the lab work this Thursday.

## 2013-08-16 NOTE — Telephone Encounter (Signed)
Please enter lab and call pt. Thank you.

## 2013-08-16 NOTE — Telephone Encounter (Signed)
Ok to add Thx

## 2013-08-17 NOTE — Telephone Encounter (Signed)
Done. Left detailed mess informing pt.  

## 2013-08-23 ENCOUNTER — Other Ambulatory Visit (INDEPENDENT_AMBULATORY_CARE_PROVIDER_SITE_OTHER): Payer: Medicare HMO

## 2013-08-23 DIAGNOSIS — M79669 Pain in unspecified lower leg: Secondary | ICD-10-CM

## 2013-08-23 DIAGNOSIS — M797 Fibromyalgia: Secondary | ICD-10-CM

## 2013-08-23 DIAGNOSIS — IMO0001 Reserved for inherently not codable concepts without codable children: Secondary | ICD-10-CM

## 2013-08-23 DIAGNOSIS — E785 Hyperlipidemia, unspecified: Secondary | ICD-10-CM

## 2013-08-23 DIAGNOSIS — M79609 Pain in unspecified limb: Secondary | ICD-10-CM

## 2013-08-23 DIAGNOSIS — E119 Type 2 diabetes mellitus without complications: Secondary | ICD-10-CM

## 2013-08-23 DIAGNOSIS — E538 Deficiency of other specified B group vitamins: Secondary | ICD-10-CM

## 2013-08-23 DIAGNOSIS — I10 Essential (primary) hypertension: Secondary | ICD-10-CM

## 2013-08-23 LAB — LIPID PANEL
CHOLESTEROL: 223 mg/dL — AB (ref 0–200)
HDL: 61.3 mg/dL (ref >39.00)
LDL CALC: 148 mg/dL — AB (ref 0–99)
NonHDL: 161.7
TRIGLYCERIDES: 68 mg/dL (ref 0.0–149.0)
Total CHOL/HDL Ratio: 4
VLDL: 13.6 mg/dL (ref 0.0–40.0)

## 2013-08-23 LAB — TSH: TSH: 1.51 u[IU]/mL (ref 0.35–4.50)

## 2013-08-23 LAB — BASIC METABOLIC PANEL
BUN: 19 mg/dL (ref 6–23)
CHLORIDE: 103 meq/L (ref 96–112)
CO2: 29 meq/L (ref 19–32)
Calcium: 9.8 mg/dL (ref 8.4–10.5)
Creatinine, Ser: 0.9 mg/dL (ref 0.4–1.2)
GFR: 68.79 mL/min (ref >60.00)
Glucose, Bld: 91 mg/dL (ref 70–99)
Potassium: 4 mEq/L (ref 3.5–5.1)
Sodium: 140 mEq/L (ref 135–145)

## 2013-08-23 LAB — HEPATIC FUNCTION PANEL
ALBUMIN: 4 g/dL (ref 3.5–5.2)
ALK PHOS: 48 U/L (ref 39–117)
ALT: 17 U/L (ref 0–35)
AST: 19 U/L (ref 0–37)
Bilirubin, Direct: 0.1 mg/dL (ref 0.0–0.3)
TOTAL PROTEIN: 7.5 g/dL (ref 6.0–8.3)
Total Bilirubin: 1.1 mg/dL (ref 0.2–1.2)

## 2013-08-23 LAB — VITAMIN B12: Vitamin B-12: 762 pg/mL (ref 211–911)

## 2013-08-23 LAB — HEMOGLOBIN A1C: HEMOGLOBIN A1C: 5.8 % (ref 4.6–6.5)

## 2013-08-30 ENCOUNTER — Ambulatory Visit (INDEPENDENT_AMBULATORY_CARE_PROVIDER_SITE_OTHER): Payer: Medicare HMO | Admitting: Internal Medicine

## 2013-08-30 ENCOUNTER — Encounter: Payer: Self-pay | Admitting: Internal Medicine

## 2013-08-30 VITALS — BP 114/68 | HR 84 | Temp 98.1°F | Resp 16 | Wt 168.0 lb

## 2013-08-30 DIAGNOSIS — M545 Low back pain, unspecified: Secondary | ICD-10-CM

## 2013-08-30 DIAGNOSIS — I10 Essential (primary) hypertension: Secondary | ICD-10-CM

## 2013-08-30 DIAGNOSIS — M797 Fibromyalgia: Secondary | ICD-10-CM

## 2013-08-30 DIAGNOSIS — F3289 Other specified depressive episodes: Secondary | ICD-10-CM

## 2013-08-30 DIAGNOSIS — E669 Obesity, unspecified: Secondary | ICD-10-CM

## 2013-08-30 DIAGNOSIS — E119 Type 2 diabetes mellitus without complications: Secondary | ICD-10-CM

## 2013-08-30 DIAGNOSIS — B351 Tinea unguium: Secondary | ICD-10-CM

## 2013-08-30 DIAGNOSIS — IMO0001 Reserved for inherently not codable concepts without codable children: Secondary | ICD-10-CM

## 2013-08-30 DIAGNOSIS — E538 Deficiency of other specified B group vitamins: Secondary | ICD-10-CM

## 2013-08-30 DIAGNOSIS — F329 Major depressive disorder, single episode, unspecified: Secondary | ICD-10-CM

## 2013-08-30 DIAGNOSIS — Z23 Encounter for immunization: Secondary | ICD-10-CM

## 2013-08-30 DIAGNOSIS — F411 Generalized anxiety disorder: Secondary | ICD-10-CM

## 2013-08-30 MED ORDER — CICLOPIROX 8 % EX SOLN: Freq: Every day | CUTANEOUS | Status: DC

## 2013-08-30 NOTE — Assessment & Plan Note (Signed)
Continue with current prescription therapy as reflected on the Med list.  

## 2013-08-30 NOTE — Assessment & Plan Note (Signed)
[place]

## 2013-08-30 NOTE — Assessment & Plan Note (Signed)
Continue with current prn therapy as reflected on the Med list.

## 2013-08-30 NOTE — Assessment & Plan Note (Addendum)
Wt Readings from Last 3 Encounters:  08/30/13 168 lb (76.204 kg)  05/10/13 174 lb (78.926 kg)  02/09/13 181 lb (82.101 kg)   Better  Fat apron - advised appt w/Dr Contagianis

## 2013-08-30 NOTE — Progress Notes (Signed)
   Subjective:     HPI   F/u falls on Losartan - resolved after she stopped it Hip pain is better s/p L THR on Nov 2014 Dr Mayer Camel is planned. Pain is much better off Rx F/u diarrhea - resolved after she started to take metformin at hs - resolved   F/u  insomnia - better.  The patient presents for a follow-up of  chronic hypertension, chronic dyslipidemia, pains, type 2 diabetes controlled with medicines.  F/u on memory issues better off flexeril and Tramadol F/u a painful callus R index finger   BP Readings from Last 3 Encounters:  08/30/13 114/68  05/10/13 118/70  02/09/13 140/88   Wt Readings from Last 3 Encounters:  08/30/13 168 lb (76.204 kg)  05/10/13 174 lb (78.926 kg)  02/09/13 181 lb (82.101 kg)    Review of Systems  Constitutional: Negative for activity change, appetite change, fatigue and unexpected weight change.  HENT: Negative for congestion, mouth sores and sinus pressure.   Eyes: Negative for visual disturbance.  Respiratory: Negative for chest tightness.   Gastrointestinal: Negative for nausea.  Genitourinary: Negative for frequency, difficulty urinating and vaginal pain.  Musculoskeletal: Negative for back pain and gait problem.  Skin: Negative for pallor.  Neurological: Negative for dizziness, tremors, weakness and numbness.  Psychiatric/Behavioral: Negative for confusion and sleep disturbance. The patient is not nervous/anxious.        Objective:   Physical Exam  Constitutional: She appears well-developed and well-nourished. No distress.  HENT:  Head: Normocephalic.  Right Ear: External ear normal.  Left Ear: External ear normal.  Nose: Nose normal.  Mouth/Throat: Oropharynx is clear and moist.  Eyes: Conjunctivae are normal. Pupils are equal, round, and reactive to light. Right eye exhibits no discharge. Left eye exhibits no discharge.  Neck: Normal range of motion. Neck supple. No JVD present. No tracheal deviation present. No thyromegaly  present.  Cardiovascular: Normal rate, regular rhythm and normal heart sounds.   Pulmonary/Chest: No stridor. No respiratory distress. She has no wheezes.  Abdominal: Soft. Bowel sounds are normal. She exhibits no distension and no mass. There is no tenderness. There is no rebound and no guarding.  Musculoskeletal: She exhibits no edema and no tenderness.  Lymphadenopathy:    She has no cervical adenopathy.  Neurological: She displays normal reflexes. No cranial nerve deficit. She exhibits normal muscle tone. Coordination normal.  Skin: No rash noted. No erythema.  Psychiatric: She has a normal mood and affect. Her behavior is normal. Judgment and thought content normal.  L hip is less tender; no cane r posterior shin is tender B toes #1-2 are crossed   Lab Results  Component Value Date   WBC 5.4 02/03/2013   HGB 14.1 02/03/2013   HCT 41.6 02/03/2013   PLT 208.0 02/03/2013   GLUCOSE 91 08/23/2013   CHOL 223* 08/23/2013   TRIG 68.0 08/23/2013   HDL 61.30 08/23/2013   LDLDIRECT 148.2 02/03/2013   LDLCALC 148* 08/23/2013   ALT 17 08/23/2013   AST 19 08/23/2013   NA 140 08/23/2013   K 4.0 08/23/2013   CL 103 08/23/2013   CREATININE 0.9 08/23/2013   BUN 19 08/23/2013   CO2 29 08/23/2013   TSH 1.51 08/23/2013   INR 1.14 11/21/2012   HGBA1C 5.8 08/23/2013       Assessment & Plan:

## 2013-08-30 NOTE — Progress Notes (Signed)
Pre visit review using our clinic review tool, if applicable. No additional management support is needed unless otherwise documented below in the visit note. 

## 2013-11-09 ENCOUNTER — Other Ambulatory Visit: Payer: Self-pay

## 2013-11-09 MED ORDER — CICLOPIROX 8 % EX SOLN: Freq: Every day | CUTANEOUS | Status: DC

## 2013-11-10 ENCOUNTER — Other Ambulatory Visit: Payer: Self-pay | Admitting: Geriatric Medicine

## 2013-11-10 MED ORDER — CICLOPIROX 8 % EX SOLN: Freq: Every day | CUTANEOUS | Status: DC

## 2013-11-14 LAB — HM DIABETES EYE EXAM

## 2013-11-24 ENCOUNTER — Other Ambulatory Visit: Payer: Self-pay

## 2013-11-24 MED ORDER — METFORMIN HCL 500 MG PO TABS: 500.0000 mg | ORAL_TABLET | Freq: Two times a day (BID) | ORAL | Status: DC

## 2013-11-29 ENCOUNTER — Telehealth: Payer: Self-pay | Admitting: Internal Medicine

## 2013-11-29 ENCOUNTER — Encounter: Payer: Self-pay | Admitting: Internal Medicine

## 2013-11-29 NOTE — Telephone Encounter (Signed)
Patient Information:  Caller Name: Gertrue  Phone: [phone]  Patient: Tiffany Gibbs, Tiffany Gibbs  Gender: Female  DOB: 06-15-1945  Age: 68 Years  PCP: [name], Alex (Adults only)  Office Follow Up:  Does the office need to follow up with this patient?: No  Instructions For The Office: N/A   Symptoms  Reason For Call & Symptoms: lump Noticed lump, now sixe of half-dollar, on left leg just to side of tibia about 8 inches above ankle yesterday 11/17. Can't  remember injury but lump is very tender to touch. Worried that she might have blood clot.  Got response from Dr Alain Marion that she would not be able to see him until Friday, but could make appointment wtih another MD.  Dayton Scrape she needs to be seen sooner than Friday.  Reviewed Health History In EMR: Yes  Reviewed Medications In EMR: Yes  Reviewed Allergies In EMR: Yes  Reviewed Surgeries / Procedures: Yes  Date of Onset of Symptoms: 11/28/2013  Guideline(s) Used:  Leg Pain  Disposition Per Guideline:   Go to ED Now (or to Office with PCP Approval)  Reason For Disposition Reached:   Thigh or calf pain in only one leg and present > 1 hour  Advice Given:  N/A  Patient Refused Recommendation:  Patient Will Follow Up With Office Later  Appointment made for tomorrow - Thursday 11/19 at 1:30 pm Terri Piedra

## 2013-11-29 NOTE — Telephone Encounter (Signed)
Ok w/in tomorrow Thx

## 2013-11-29 NOTE — Telephone Encounter (Signed)
Patient Information:  Caller Name: Tiffany Gibbs  Phone: [phone]  Patient: Tiffany Gibbs, Tiffany Gibbs  Gender: Female  DOB: Jun 22, 1945  Age: 68 Years  PCP: [name], Alex (Adults only)  Office Follow Up:  Does the office need to follow up with this patient?: Yes  Instructions For The Office: Patient requesting appoiintment with Dr Alain Marion late in day today. Please review and contact patient at  [phone].   Symptoms  Reason For Call & Symptoms: Noticed a knot on left leg just to side of tibia about 8 inches above ankle yesterday 11/17.  Does not remember any injury but lump now size of half-dollar and is very tender to touch.  Able to walk without pain.  Notes numbness at lump.  Reviewed Health History In EMR: Yes  Reviewed Medications In EMR: Yes  Reviewed Allergies In EMR: Yes  Reviewed Surgeries / Procedures: Yes  Date of Onset of Symptoms: 11/28/2013  Guideline(s) Used:  Leg Pain  Disposition Per Guideline:   Go to ED Now (or to Office with PCP Approval)  Reason For Disposition Reached:   Thigh or calf pain in only one leg and present > 1 hour  Advice Given:  N/A  Patient Refused Recommendation:  Patient Refused Care Advice  Patient currently getting hair perm.  Would only want appointment with Dr. Alain Marion later this afternoon.  Declined appointment at 2 pm - says is an hour away and in process of perm.

## 2013-11-29 NOTE — Telephone Encounter (Signed)
Dr. Camila Li schedule is completely book, unable to work pt in today. Offer an appt with Plot On Friday 12/01/13 or with another doctor here, but pt request to speak to the caller nurse for more triage.

## 2013-11-30 ENCOUNTER — Ambulatory Visit: Payer: Self-pay | Admitting: Family

## 2013-11-30 ENCOUNTER — Encounter: Payer: Self-pay | Admitting: Internal Medicine

## 2013-11-30 ENCOUNTER — Ambulatory Visit (INDEPENDENT_AMBULATORY_CARE_PROVIDER_SITE_OTHER): Payer: Medicare HMO | Admitting: Internal Medicine

## 2013-11-30 VITALS — BP 120/80 | HR 69 | Temp 98.2°F | Wt 177.0 lb

## 2013-11-30 DIAGNOSIS — H60391 Other infective otitis externa, right ear: Secondary | ICD-10-CM

## 2013-11-30 DIAGNOSIS — H60399 Other infective otitis externa, unspecified ear: Secondary | ICD-10-CM | POA: Insufficient documentation

## 2013-11-30 DIAGNOSIS — E119 Type 2 diabetes mellitus without complications: Secondary | ICD-10-CM

## 2013-11-30 DIAGNOSIS — I8002 Phlebitis and thrombophlebitis of superficial vessels of left lower extremity: Secondary | ICD-10-CM

## 2013-11-30 DIAGNOSIS — I8000 Phlebitis and thrombophlebitis of superficial vessels of unspecified lower extremity: Secondary | ICD-10-CM | POA: Insufficient documentation

## 2013-11-30 MED ORDER — METFORMIN HCL 500 MG PO TABS: 500.0000 mg | ORAL_TABLET | Freq: Two times a day (BID) | ORAL | Status: DC

## 2013-11-30 MED ORDER — NEOMYCIN-POLYMYXIN-HC 3.5-10000-1 OT SOLN: 3.0000 [drp] | Freq: Three times a day (TID) | OTIC | Status: DC

## 2013-11-30 NOTE — Assessment & Plan Note (Signed)
Cortisporin prn

## 2013-11-30 NOTE — Telephone Encounter (Signed)
Notified pt made appt for this am @ 11...Tiffany Gibbs

## 2013-11-30 NOTE — Patient Instructions (Signed)
Arnica ointment Warm compress Elastic knee highs Aspirin 325 mg daily x 1-2 weeks

## 2013-11-30 NOTE — Progress Notes (Signed)
   Subjective:     HPI   C/o l shin lump and pain x2 d.  The patient presents for a follow-up of  chronic hypertension, chronic dyslipidemia, pains, type 2 diabetes controlled with medicines.  F/u on memory issues better off flexeril and Tramadol F/u a painful callus R index finger   BP Readings from Last 3 Encounters:  11/30/13 120/80  08/30/13 114/68  05/10/13 118/70   Wt Readings from Last 3 Encounters:  11/30/13 177 lb (80.287 kg)  08/30/13 168 lb (76.204 kg)  05/10/13 174 lb (78.926 kg)    Review of Systems  Constitutional: Negative for activity change, appetite change, fatigue and unexpected weight change.  HENT: Negative for congestion, mouth sores and sinus pressure.   Eyes: Negative for visual disturbance.  Respiratory: Negative for chest tightness.   Gastrointestinal: Negative for nausea.  Genitourinary: Negative for frequency, difficulty urinating and vaginal pain.  Musculoskeletal: Negative for back pain and gait problem.  Skin: Negative for pallor.  Neurological: Negative for dizziness, tremors, weakness and numbness.  Psychiatric/Behavioral: Negative for confusion and sleep disturbance. The patient is not nervous/anxious.        Objective:   Physical Exam  Constitutional: She appears well-developed. No distress.  HENT:  Head: Normocephalic.  Right Ear: External ear normal.  Left Ear: External ear normal.  Nose: Nose normal.  Mouth/Throat: Oropharynx is clear and moist.  Eyes: Conjunctivae are normal. Pupils are equal, round, and reactive to light. Right eye exhibits no discharge. Left eye exhibits no discharge.  Neck: Normal range of motion. Neck supple. No JVD present. No tracheal deviation present. No thyromegaly present.  Cardiovascular: Normal rate, regular rhythm and normal heart sounds.   Pulmonary/Chest: No stridor. No respiratory distress. She has no wheezes.  Abdominal: Soft. Bowel sounds are normal. She exhibits no distension and no mass.  There is no tenderness. There is no rebound and no guarding.  Musculoskeletal: She exhibits no edema or tenderness.  Lymphadenopathy:    She has no cervical adenopathy.  Neurological: She displays normal reflexes. No cranial nerve deficit. She exhibits normal muscle tone. Coordination normal.  Skin: No rash noted. No erythema.  Psychiatric: She has a normal mood and affect. Her behavior is normal. Judgment and thought content normal.  L hip is less tender; no cane r posterior shin is tender B toes #1-2 are crossed  L shin w/a 1.5 cm tender lump 9x4 cm yellowish bruise around the lump   Lab Results  Component Value Date   WBC 5.4 02/03/2013   HGB 14.1 02/03/2013   HCT 41.6 02/03/2013   PLT 208.0 02/03/2013   GLUCOSE 91 08/23/2013   CHOL 223* 08/23/2013   TRIG 68.0 08/23/2013   HDL 61.30 08/23/2013   LDLDIRECT 148.2 02/03/2013   LDLCALC 148* 08/23/2013   ALT 17 08/23/2013   AST 19 08/23/2013   NA 140 08/23/2013   K 4.0 08/23/2013   CL 103 08/23/2013   CREATININE 0.9 08/23/2013   BUN 19 08/23/2013   CO2 29 08/23/2013   TSH 1.51 08/23/2013   INR 1.14 11/21/2012   HGBA1C 5.8 08/23/2013       Assessment & Plan:

## 2013-11-30 NOTE — Assessment & Plan Note (Signed)
Continue with current prescription therapy as reflected on the Med list.  

## 2013-11-30 NOTE — Progress Notes (Signed)
Pre visit review using our clinic review tool, if applicable. No additional management support is needed unless otherwise documented below in the visit note. 

## 2013-12-01 ENCOUNTER — Ambulatory Visit: Payer: Medicare HMO | Admitting: Internal Medicine

## 2013-12-15 ENCOUNTER — Encounter: Payer: Self-pay | Admitting: Internal Medicine

## 2013-12-15 ENCOUNTER — Ambulatory Visit (INDEPENDENT_AMBULATORY_CARE_PROVIDER_SITE_OTHER): Payer: Medicare HMO | Admitting: Internal Medicine

## 2013-12-15 VITALS — BP 124/82 | HR 67 | Temp 98.0°F | Wt 180.0 lb

## 2013-12-15 DIAGNOSIS — S8010XA Contusion of unspecified lower leg, initial encounter: Secondary | ICD-10-CM | POA: Insufficient documentation

## 2013-12-15 DIAGNOSIS — I8002 Phlebitis and thrombophlebitis of superficial vessels of left lower extremity: Secondary | ICD-10-CM

## 2013-12-15 DIAGNOSIS — S8012XD Contusion of left lower leg, subsequent encounter: Secondary | ICD-10-CM

## 2013-12-15 MED ORDER — ASPIRIN EC 81 MG PO TBEC: 81.0000 mg | DELAYED_RELEASE_TABLET | Freq: Every day | ORAL | Status: DC

## 2013-12-15 NOTE — Assessment & Plan Note (Addendum)
Resolved D/c full dose ASA; re-start baby ASA

## 2013-12-15 NOTE — Assessment & Plan Note (Signed)
Better [place]

## 2013-12-15 NOTE — Progress Notes (Signed)
Pre visit review using our clinic review tool, if applicable. No additional management support is needed unless otherwise documented below in the visit note. 

## 2013-12-15 NOTE — Progress Notes (Signed)
Subjective:     HPI   C/o l shin lump and pain x3 weeks  The patient presents for a follow-up of  chronic hypertension, chronic dyslipidemia, pains, type 2 diabetes controlled with medicines.  F/u on memory issues better off flexeril and Tramadol    BP Readings from Last 3 Encounters:  12/15/13 124/82  11/30/13 120/80  08/30/13 114/68   Wt Readings from Last 3 Encounters:  12/15/13 180 lb (81.647 kg)  11/30/13 177 lb (80.287 kg)  08/30/13 168 lb (76.204 kg)    Review of Systems  Constitutional: Negative for activity change, appetite change, fatigue and unexpected weight change.  HENT: Negative for congestion, mouth sores and sinus pressure.   Eyes: Negative for visual disturbance.  Respiratory: Negative for chest tightness.   Gastrointestinal: Negative for nausea.  Genitourinary: Negative for frequency, difficulty urinating and vaginal pain.  Musculoskeletal: Negative for back pain and gait problem.  Skin: Negative for pallor.  Neurological: Negative for dizziness, tremors, weakness and numbness.  Psychiatric/Behavioral: Negative for confusion and sleep disturbance. The patient is not nervous/anxious.        Objective:   Physical Exam  Constitutional: She appears well-developed. No distress.  HENT:  Head: Normocephalic.  Right Ear: External ear normal.  Left Ear: External ear normal.  Nose: Nose normal.  Mouth/Throat: Oropharynx is clear and moist.  Eyes: Conjunctivae are normal. Pupils are equal, round, and reactive to light. Right eye exhibits no discharge. Left eye exhibits no discharge.  Neck: Normal range of motion. Neck supple. No JVD present. No tracheal deviation present. No thyromegaly present.  Cardiovascular: Normal rate, regular rhythm and normal heart sounds.   Pulmonary/Chest: No stridor. No respiratory distress. She has no wheezes.  Abdominal: Soft. Bowel sounds are normal. She exhibits no distension and no mass. There is no tenderness. There is  no rebound and no guarding.  Musculoskeletal: She exhibits no edema or tenderness.  Lymphadenopathy:    She has no cervical adenopathy.  Neurological: She displays normal reflexes. No cranial nerve deficit. She exhibits normal muscle tone. Coordination normal.  Skin: No rash noted. No erythema.  Psychiatric: She has a normal mood and affect. Her behavior is normal. Judgment and thought content normal.  L hip is less tender; no cane r posterior shin is tender B toes #1-2 are crossed  L shin w/a 1.0 cm tender lump No yellowish bruise around the lump   Lab Results  Component Value Date   WBC 5.4 02/03/2013   HGB 14.1 02/03/2013   HCT 41.6 02/03/2013   PLT 208.0 02/03/2013   GLUCOSE 91 08/23/2013   CHOL 223* 08/23/2013   TRIG 68.0 08/23/2013   HDL 61.30 08/23/2013   LDLDIRECT 148.2 02/03/2013   LDLCALC 148* 08/23/2013   ALT 17 08/23/2013   AST 19 08/23/2013   NA 140 08/23/2013   K 4.0 08/23/2013   CL 103 08/23/2013   CREATININE 0.9 08/23/2013   BUN 19 08/23/2013   CO2 29 08/23/2013   TSH 1.51 08/23/2013   INR 1.14 11/21/2012   HGBA1C 5.8 08/23/2013   Procedure Note :    Procedure :   Point of care (POC) sonography examination   Indication:   Equipment used: Sonosite M-Turbo with HFL38x/13-6 MHz transducer linear probe. The images were stored in the unit and later transferred in storage.  The patient was placed in a decubitus position.  This study revealed a hypoechoic      cm lesion in   Impression:  Assessment & Plan:  Patient ID: Tiffany Gibbs, female   DOB: Sep 13, 1945, 68 y.o.   MRN: [id number]

## 2014-01-02 ENCOUNTER — Telehealth: Payer: Self-pay | Admitting: Internal Medicine

## 2014-01-02 DIAGNOSIS — E119 Type 2 diabetes mellitus without complications: Secondary | ICD-10-CM

## 2014-01-02 DIAGNOSIS — I10 Essential (primary) hypertension: Secondary | ICD-10-CM

## 2014-01-02 NOTE — Telephone Encounter (Signed)
Left mess for patient to call back.  

## 2014-01-02 NOTE — Telephone Encounter (Signed)
Patient states she would like a call back from Daniel in regards to getting lab work done.  I told patient that Erline Levine might be out for a whole week.  She was ok with receiving a phone call next week.

## 2014-01-03 NOTE — Telephone Encounter (Signed)
Labs entered. Transferred pt to scheduler.

## 2014-01-31 ENCOUNTER — Ambulatory Visit: Payer: Medicare HMO | Admitting: Internal Medicine

## 2014-02-07 ENCOUNTER — Ambulatory Visit: Payer: Medicare HMO | Admitting: Internal Medicine

## 2014-02-12 ENCOUNTER — Other Ambulatory Visit (INDEPENDENT_AMBULATORY_CARE_PROVIDER_SITE_OTHER): Payer: Medicare HMO

## 2014-02-12 DIAGNOSIS — I10 Essential (primary) hypertension: Secondary | ICD-10-CM

## 2014-02-12 DIAGNOSIS — E119 Type 2 diabetes mellitus without complications: Secondary | ICD-10-CM

## 2014-02-12 LAB — CBC WITH DIFFERENTIAL/PLATELET
Basophils Absolute: 0 10*3/uL (ref 0.0–0.1)
Basophils Relative: 0.5 % (ref 0.0–3.0)
EOS ABS: 0.1 10*3/uL (ref 0.0–0.7)
Eosinophils Relative: 2.2 % (ref 0.0–5.0)
HEMATOCRIT: 41.5 % (ref 36.0–46.0)
Hemoglobin: 14.3 g/dL (ref 12.0–15.0)
Lymphocytes Relative: 29.2 % (ref 12.0–46.0)
Lymphs Abs: 1.5 10*3/uL (ref 0.7–4.0)
MCHC: 34.4 g/dL (ref 30.0–36.0)
MCV: 86.9 fl (ref 78.0–100.0)
MONO ABS: 0.5 10*3/uL (ref 0.1–1.0)
MONOS PCT: 10.3 % (ref 3.0–12.0)
Neutro Abs: 3 10*3/uL (ref 1.4–7.7)
Neutrophils Relative %: 57.8 % (ref 43.0–77.0)
Platelets: 234 10*3/uL (ref 150.0–400.0)
RBC: 4.78 Mil/uL (ref 3.87–5.11)
RDW: 13.4 % (ref 11.5–15.5)
WBC: 5.2 10*3/uL (ref 4.0–10.5)

## 2014-02-12 LAB — COMPREHENSIVE METABOLIC PANEL
ALK PHOS: 51 U/L (ref 39–117)
ALT: 10 U/L (ref 0–35)
AST: 13 U/L (ref 0–37)
Albumin: 4.2 g/dL (ref 3.5–5.2)
BUN: 19 mg/dL (ref 6–23)
CO2: 31 meq/L (ref 19–32)
Calcium: 9.5 mg/dL (ref 8.4–10.5)
Chloride: 103 mEq/L (ref 96–112)
Creatinine, Ser: 0.89 mg/dL (ref 0.40–1.20)
GFR: 66.91 mL/min (ref >60.00)
Glucose, Bld: 90 mg/dL (ref 70–99)
Potassium: 3.9 mEq/L (ref 3.5–5.1)
SODIUM: 139 meq/L (ref 135–145)
TOTAL PROTEIN: 7.2 g/dL (ref 6.0–8.3)
Total Bilirubin: 0.5 mg/dL (ref 0.2–1.2)

## 2014-02-12 LAB — HEMOGLOBIN A1C: HEMOGLOBIN A1C: 5.9 % (ref 4.6–6.5)

## 2014-02-14 ENCOUNTER — Encounter: Payer: Self-pay | Admitting: Internal Medicine

## 2014-02-14 ENCOUNTER — Ambulatory Visit (INDEPENDENT_AMBULATORY_CARE_PROVIDER_SITE_OTHER): Payer: Medicare HMO | Admitting: Internal Medicine

## 2014-02-14 VITALS — BP 126/74 | HR 66 | Temp 98.3°F | Wt 181.0 lb

## 2014-02-14 DIAGNOSIS — I10 Essential (primary) hypertension: Secondary | ICD-10-CM

## 2014-02-14 DIAGNOSIS — E669 Obesity, unspecified: Secondary | ICD-10-CM

## 2014-02-14 DIAGNOSIS — F32A Depression, unspecified: Secondary | ICD-10-CM

## 2014-02-14 DIAGNOSIS — E119 Type 2 diabetes mellitus without complications: Secondary | ICD-10-CM

## 2014-02-14 DIAGNOSIS — F329 Major depressive disorder, single episode, unspecified: Secondary | ICD-10-CM

## 2014-02-14 DIAGNOSIS — E538 Deficiency of other specified B group vitamins: Secondary | ICD-10-CM

## 2014-02-14 NOTE — Progress Notes (Signed)
   Subjective:     HPI     The patient presents for a follow-up of  chronic hypertension, chronic dyslipidemia, pains, type 2 diabetes controlled with medicines.   F/u on memory issues better off flexeril and Tramadol    BP Readings from Last 3 Encounters:  02/14/14 126/74  12/15/13 124/82  11/30/13 120/80   Wt Readings from Last 3 Encounters:  02/14/14 181 lb (82.101 kg)  12/15/13 180 lb (81.647 kg)  11/30/13 177 lb (80.287 kg)    Review of Systems  Constitutional: Negative for activity change, appetite change, fatigue and unexpected weight change.  HENT: Negative for congestion, mouth sores and sinus pressure.   Eyes: Negative for visual disturbance.  Respiratory: Negative for chest tightness.   Gastrointestinal: Negative for nausea.  Genitourinary: Negative for frequency, difficulty urinating and vaginal pain.  Musculoskeletal: Negative for back pain and gait problem.  Skin: Negative for pallor.  Neurological: Negative for dizziness, tremors, weakness and numbness.  Psychiatric/Behavioral: Negative for confusion and sleep disturbance. The patient is not nervous/anxious.        Objective:   Physical Exam  Constitutional: She appears well-developed. No distress.  HENT:  Head: Normocephalic.  Right Ear: External ear normal.  Left Ear: External ear normal.  Nose: Nose normal.  Mouth/Throat: Oropharynx is clear and moist.  Eyes: Conjunctivae are normal. Pupils are equal, round, and reactive to light. Right eye exhibits no discharge. Left eye exhibits no discharge.  Neck: Normal range of motion. Neck supple. No JVD present. No tracheal deviation present. No thyromegaly present.  Cardiovascular: Normal rate, regular rhythm and normal heart sounds.   Pulmonary/Chest: No stridor. No respiratory distress. She has no wheezes.  Abdominal: Soft. Bowel sounds are normal. She exhibits no distension and no mass. There is no tenderness. There is no rebound and no guarding.   Musculoskeletal: She exhibits no edema or tenderness.  Lymphadenopathy:    She has no cervical adenopathy.  Neurological: She displays normal reflexes. No cranial nerve deficit. She exhibits normal muscle tone. Coordination normal.  Skin: No rash noted. No erythema.  Psychiatric: She has a normal mood and affect. Her behavior is normal. Judgment and thought content normal.   B toes #1-2 are crossed     Lab Results  Component Value Date   WBC 5.2 02/12/2014   HGB 14.3 02/12/2014   HCT 41.5 02/12/2014   PLT 234.0 02/12/2014   GLUCOSE 90 02/12/2014   CHOL 223* 08/23/2013   TRIG 68.0 08/23/2013   HDL 61.30 08/23/2013   LDLDIRECT 148.2 02/03/2013   LDLCALC 148* 08/23/2013   ALT 10 02/12/2014   AST 13 02/12/2014   NA 139 02/12/2014   K 3.9 02/12/2014   CL 103 02/12/2014   CREATININE 0.89 02/12/2014   BUN 19 02/12/2014   CO2 31 02/12/2014   TSH 1.51 08/23/2013   INR 1.14 11/21/2012   HGBA1C 5.9 02/12/2014          Assessment & Plan:

## 2014-02-14 NOTE — Assessment & Plan Note (Signed)
Continue with current prescription therapy as reflected on the Med list.  

## 2014-02-14 NOTE — Assessment & Plan Note (Signed)
Continue with current prescription therapy as reflected on the Med list. Better 

## 2014-02-14 NOTE — Assessment & Plan Note (Signed)
Continue with diet.

## 2014-02-14 NOTE — Progress Notes (Signed)
Pre visit review using our clinic review tool, if applicable. No additional management support is needed unless otherwise documented below in the visit note. 

## 2014-03-21 ENCOUNTER — Telehealth: Payer: Self-pay | Admitting: Internal Medicine

## 2014-03-21 MED ORDER — CITALOPRAM HYDROBROMIDE 40 MG PO TABS: 40.0000 mg | ORAL_TABLET | Freq: Every day | ORAL | Status: DC

## 2014-03-21 NOTE — Telephone Encounter (Signed)
Patient needs refill of citalopram (CELEXA) 20 MG tablet [[id number]] . States that the 20 MG dosage is not helping. Asks for a higher dosage x90 day supply. Patient requests a call once it is called in.

## 2014-03-21 NOTE — Telephone Encounter (Signed)
Notified pt md increase mg to 40 mg new rx has been sent to [facility]...Johny Chess

## 2014-03-21 NOTE — Telephone Encounter (Signed)
OK 40 mg - done Thx

## 2014-03-28 ENCOUNTER — Encounter: Payer: Medicare HMO | Attending: Internal Medicine | Admitting: *Deleted

## 2014-03-28 ENCOUNTER — Encounter: Payer: Self-pay | Admitting: *Deleted

## 2014-03-28 VITALS — Ht 64.5 in | Wt 185.5 lb

## 2014-03-28 DIAGNOSIS — Z713 Dietary counseling and surveillance: Secondary | ICD-10-CM | POA: Diagnosis not present

## 2014-03-28 DIAGNOSIS — E119 Type 2 diabetes mellitus without complications: Secondary | ICD-10-CM | POA: Diagnosis present

## 2014-03-28 NOTE — Patient Instructions (Signed)
Plan:  Aim for 2-3 Carb Choices per meal (30-45 grams) +/- 1 either way  Aim for 0-15 Carbs per snack if hungry  Include protein in moderation with your meals and snacks Consider reading food labels for Total Carbohydrate and Fat Grams of foods Consider  increasing your activity level by going to the gym and exercising for 30 minutes daily as tolerated Continue taking medication as directed by MD  [place] Consider changing from butter to "[employer]" ALWAYS HAVE PROTEIN WITH YOUR CARBOHYDRATES  MUST EAT BREAKFAST DAILY TRY TO GET RID OF THE SWEETS IN THE HOUSE (Ice Cream)  EVENING SNACKS: Omega pineapple, [employer]

## 2014-03-28 NOTE — Progress Notes (Signed)
Diabetes Self-Management Education  Visit Type:  Initial DSMe  Appt. Start Time: 1400 Appt. End Time: 3664  03/28/2014  Ms. Avryl Roehm, identified by name and date of birth, is a 69 y.o. female with a diagnosis of Diabetes: Type 2.  Other people present during visit:  Patient .  ASSESSMENT  Height 5' 4.5" (1.638 m), weight 185 lb 8 oz (84.142 kg). Body mass index is 31.36 kg/(m^2).  Initial Visit Information:  Are you currently following a meal plan?: No Are you taking your medications as prescribed?: Yes Are you checking your feet?: No How often do you need to have someone help you when you read instructions, pamphlets, or other written materials from your doctor or pharmacy?: 1 - Never   Psychosocial:   Patient Belief/Attitude about Diabetes: Motivated to manage diabetes Self-care barriers:  (patient's husband has early dementia which increases her stress level) Self-management support: Doctor's office, Family, CDE visits Other persons present: Patient Patient Concerns: Nutrition/Meal planning, Healthy Lifestyle Special Needs: None Preferred Learning Style: No preference indicated Learning Readiness: Change in progress  Complications:   Last HgB A1C per patient/outside source: 5.9 mg/dL How often do you check your blood sugar?: Not recommended by provider Have you had a dilated eye exam in the past 12 months?: Yes Have you had a dental exam in the past 12 months?: No  Diet Intake:  Breakfast: egg, sausage, pita / oatmeal Lunch: salad (lettuce, peppers, onions, black beans, navy beans, ), sandwich on pita bread Dinner: chicken rolled in panko, grilled with onions, mushroms, olive oil./ pork chops rolled with ham & cheese / steak, roast beef sandwich (  Snack (evening): ice cream, pie, muffin, chocolate ( whoppers), gummy worms  Exercise:  Exercise: Moderate (swimming / aerobic walking) Moderate Exercise amount of time (min / week): 150  Individualized Plan for  Diabetes Self-Management Training:   Learning Objective:  Patient will have a greater understanding of diabetes self-management. Patient education plan per assessed needs and concerns is to attend individual sessions      Education Topics Reviewed with Patient Today:   Role of diet in the treatment of diabetes and the relationship between the three main macronutrients and blood glucose level, Information on hints to eating out and maintain blood glucose control., Meal options for control of blood glucose level and chronic complications., Carbohydrate counting, Food label reading, portion sizes and measuring food. Role of exercise on diabetes management, blood pressure control and cardiac health. Daily foot exams, Yearly dilated eye exam, Identified appropriate SMBG and/or A1C goals. Relationship between chronic complications and blood glucose control, Assessed and discussed foot care and prevention of foot problems, Dental care, Identified and discussed with patient  current chronic complications Role of stress on diabetes  PATIENTS GOALS/Plan (Developed by the patient):  Nutrition: General guidelines for healthy choices and portions discussed Physical Activity: Exercise 5-7 days per week Medications: take my medication as prescribed Reducing Risk: do foot checks daily, get labs drawn  Patient Instructions  Plan:  Aim for 2-3 Carb Choices per meal (30-45 grams) +/- 1 either way  Aim for 0-15 Carbs per snack if hungry  Include protein in moderation with your meals and snacks Consider reading food labels for Total Carbohydrate and Fat Grams of foods Consider  increasing your activity level by going to the gym and exercising for 30 minutes daily as tolerated Continue taking medication as directed by MD  [place] Consider changing from butter to "[place]" ALWAYS  HAVE PROTEIN WITH YOUR CARBOHYDRATES  MUST EAT BREAKFAST DAILY TRY TO GET RID OF THE SWEETS IN  THE HOUSE (Ice Cream)  EVENING SNACKS: [place] & pineapple, [facility] [employer]  Expected Outcomes:  Demonstrated interest in learning. Expect positive outcomes  Education material provided: Living Well with Diabetes, Food label handouts, Meal plan card, My Plate and Snack sheet  If problems or questions, patient to contact team via:  Phone  Future DSME appointment: PRN

## 2014-04-19 ENCOUNTER — Other Ambulatory Visit (INDEPENDENT_AMBULATORY_CARE_PROVIDER_SITE_OTHER): Payer: Medicare HMO

## 2014-04-19 ENCOUNTER — Encounter: Payer: Self-pay | Admitting: Internal Medicine

## 2014-04-19 ENCOUNTER — Ambulatory Visit (INDEPENDENT_AMBULATORY_CARE_PROVIDER_SITE_OTHER): Payer: Medicare HMO | Admitting: Internal Medicine

## 2014-04-19 VITALS — BP 118/70 | HR 65 | Wt 186.0 lb

## 2014-04-19 DIAGNOSIS — I10 Essential (primary) hypertension: Secondary | ICD-10-CM

## 2014-04-19 DIAGNOSIS — E119 Type 2 diabetes mellitus without complications: Secondary | ICD-10-CM | POA: Diagnosis not present

## 2014-04-19 DIAGNOSIS — H60393 Other infective otitis externa, bilateral: Secondary | ICD-10-CM

## 2014-04-19 DIAGNOSIS — M79641 Pain in right hand: Secondary | ICD-10-CM | POA: Diagnosis not present

## 2014-04-19 LAB — LIPID PANEL
CHOL/HDL RATIO: 3
CHOLESTEROL: 226 mg/dL — AB (ref 0–200)
HDL: 66.9 mg/dL (ref >39.00)
LDL Cholesterol: 143 mg/dL — ABNORMAL HIGH (ref 0–99)
NonHDL: 159.1
Triglycerides: 79 mg/dL (ref 0.0–149.0)
VLDL: 15.8 mg/dL (ref 0.0–40.0)

## 2014-04-19 LAB — BASIC METABOLIC PANEL
BUN: 16 mg/dL (ref 6–23)
CO2: 30 mEq/L (ref 19–32)
Calcium: 9.7 mg/dL (ref 8.4–10.5)
Chloride: 102 mEq/L (ref 96–112)
Creatinine, Ser: 0.97 mg/dL (ref 0.40–1.20)
GFR: 60.55 mL/min (ref >60.00)
GLUCOSE: 92 mg/dL (ref 70–99)
Potassium: 3.8 mEq/L (ref 3.5–5.1)
SODIUM: 138 meq/L (ref 135–145)

## 2014-04-19 LAB — HEPATIC FUNCTION PANEL
ALBUMIN: 4.5 g/dL (ref 3.5–5.2)
ALT: 12 U/L (ref 0–35)
AST: 16 U/L (ref 0–37)
Alkaline Phosphatase: 54 U/L (ref 39–117)
BILIRUBIN DIRECT: 0.1 mg/dL (ref 0.0–0.3)
Total Bilirubin: 0.7 mg/dL (ref 0.2–1.2)
Total Protein: 7.8 g/dL (ref 6.0–8.3)

## 2014-04-19 LAB — HEMOGLOBIN A1C: HEMOGLOBIN A1C: 5.6 % (ref 4.6–6.5)

## 2014-04-19 LAB — URIC ACID: Uric Acid, Serum: 7.4 mg/dL — ABNORMAL HIGH (ref 2.4–7.0)

## 2014-04-19 MED ORDER — CIPROFLOXACIN-HYDROCORTISONE 0.2-1 % OT SUSP: 3.0000 [drp] | Freq: Two times a day (BID) | OTIC | Status: DC

## 2014-04-19 MED ORDER — MELOXICAM 15 MG PO TABS: 15.0000 mg | ORAL_TABLET | Freq: Every day | ORAL | Status: DC | PRN

## 2014-04-19 NOTE — Assessment & Plan Note (Signed)
BP Readings from Last 3 Encounters:  04/19/14 118/70  02/14/14 126/74  12/15/13 124/82

## 2014-04-19 NOTE — Assessment & Plan Note (Signed)
On Metformin 

## 2014-04-19 NOTE — Progress Notes (Signed)
Subjective:     Fall The accident occurred more than 1 week ago (fell on Feb 15 in [place]). She landed on concrete. There was no blood loss. The point of impact was the head, face and right wrist. The pain is present in the right hand and right knee. The pain is moderate. Pertinent negatives include no fever, nausea or numbness.  Hand Pain  Pertinent negatives include no numbness.  Pt went to UC on 3/28 for r hand pain and swelling - she was given Clinda and Cipro. X ray was ok    The patient presents for a follow-up of  chronic hypertension, chronic dyslipidemia, pains, type 2 diabetes controlled with medicines.   F/u on memory issues better off flexeril and Tramadol    BP Readings from Last 3 Encounters:  04/19/14 118/70  02/14/14 126/74  12/15/13 124/82   Wt Readings from Last 3 Encounters:  04/19/14 186 lb (84.369 kg)  03/28/14 185 lb 8 oz (84.142 kg)  02/14/14 181 lb (82.101 kg)    Review of Systems  Constitutional: Negative for fever, activity change, appetite change, fatigue and unexpected weight change.  HENT: Negative for congestion, mouth sores and sinus pressure.   Eyes: Negative for visual disturbance.  Respiratory: Negative for chest tightness.   Gastrointestinal: Negative for nausea.  Genitourinary: Negative for frequency, difficulty urinating and vaginal pain.  Musculoskeletal: Negative for back pain and gait problem.  Skin: Negative for pallor.  Neurological: Negative for dizziness, tremors, weakness and numbness.  Psychiatric/Behavioral: Negative for confusion and sleep disturbance. The patient is not nervous/anxious.        Objective:   Physical Exam  Constitutional: She appears well-developed. No distress.  HENT:  Head: Normocephalic.  Right Ear: External ear normal.  Left Ear: External ear normal.  Nose: Nose normal.  Mouth/Throat: Oropharynx is clear and moist.  Eyes: Conjunctivae are normal. Pupils are equal, round, and reactive to light.  Right eye exhibits no discharge. Left eye exhibits no discharge.  Neck: Normal range of motion. Neck supple. No JVD present. No tracheal deviation present. No thyromegaly present.  Cardiovascular: Normal rate, regular rhythm and normal heart sounds.   Pulmonary/Chest: No stridor. No respiratory distress. She has no wheezes.  Abdominal: Soft. Bowel sounds are normal. She exhibits no distension and no mass. There is no tenderness. There is no rebound and no guarding.  Musculoskeletal: She exhibits no edema or tenderness.  Lymphadenopathy:    She has no cervical adenopathy.  Neurological: She displays normal reflexes. No cranial nerve deficit. She exhibits normal muscle tone. Coordination normal.  Skin: No rash noted. No erythema.  Psychiatric: She has a normal mood and affect. Her behavior is normal. Judgment and thought content normal.  R 3d finger MIP and prox phalanx is swollen and tender B toes #1-2 are crossed     Lab Results  Component Value Date   WBC 5.2 02/12/2014   HGB 14.3 02/12/2014   HCT 41.5 02/12/2014   PLT 234.0 02/12/2014   GLUCOSE 90 02/12/2014   CHOL 223* 08/23/2013   TRIG 68.0 08/23/2013   HDL 61.30 08/23/2013   LDLDIRECT 148.2 02/03/2013   LDLCALC 148* 08/23/2013   ALT 10 02/12/2014   AST 13 02/12/2014   NA 139 02/12/2014   K 3.9 02/12/2014   CL 103 02/12/2014   CREATININE 0.89 02/12/2014   BUN 19 02/12/2014   CO2 31 02/12/2014   TSH 1.51 08/23/2013   INR 1.14 11/21/2012   HGBA1C 5.9 02/12/2014  Assessment & Plan:

## 2014-04-19 NOTE — Progress Notes (Signed)
Pre visit review using our clinic review tool, if applicable. No additional management support is needed unless otherwise documented below in the visit note. 

## 2014-04-19 NOTE — Assessment & Plan Note (Signed)
R 3d finger was injured in Feb 2016 Splint/ACE Meloxicam 15 mg/d Dr Tamala Julian in 1 wk

## 2014-04-19 NOTE — Assessment & Plan Note (Signed)
Cipro-HC prn Rx

## 2014-04-23 ENCOUNTER — Telehealth: Payer: Self-pay | Admitting: Internal Medicine

## 2014-04-23 NOTE — Telephone Encounter (Signed)
Called pt- spoke to husband again who states she is not available. Copy of labs mailed to pt.

## 2014-04-23 NOTE — Telephone Encounter (Signed)
Pt request more information about the lab work, pt wasn't to know what gout is and wondering why and how she got it. Pt also request a copy of her lab to be mail to her. Please call pt

## 2014-04-23 NOTE — Telephone Encounter (Signed)
Patient states that [facility] cannot afford cost of ciprofloxacin-hydrocortisone (CIPRO HC OTIC) otic suspension [[id number]] . They do not have a generic form available. Tiffany Gibbs is asking for something that is generic and has a lower cost.

## 2014-04-23 NOTE — Telephone Encounter (Signed)
Ether wanted "milky" ear drops. Pls have her check w/her pharmacy: what is affordable milky ear drops available.  Thx

## 2014-04-24 NOTE — Telephone Encounter (Signed)
Patient is calling back regarding her lab and more info on gout

## 2014-04-24 NOTE — Telephone Encounter (Signed)
Notified pt with md response. She stated will contact her pharmacist and will call back with name of drops...Tiffany Gibbs

## 2014-04-24 NOTE — Telephone Encounter (Signed)
Patient is requesting that you call her mobile # to speak on labs.

## 2014-04-25 NOTE — Telephone Encounter (Signed)
I called pt and answered her questions about her 04/19/14 labs ( see labs) and advised her to keep OV with Dr. Tamala Julian next week and with PCP in 06/2014. Pt voiced understanding.

## 2014-05-02 ENCOUNTER — Encounter: Payer: Self-pay | Admitting: Family Medicine

## 2014-05-02 ENCOUNTER — Other Ambulatory Visit (INDEPENDENT_AMBULATORY_CARE_PROVIDER_SITE_OTHER): Payer: Medicare HMO

## 2014-05-02 ENCOUNTER — Ambulatory Visit (INDEPENDENT_AMBULATORY_CARE_PROVIDER_SITE_OTHER): Payer: Medicare HMO | Admitting: Family Medicine

## 2014-05-02 VITALS — BP 130/62 | HR 64 | Ht 65.0 in | Wt 185.0 lb

## 2014-05-02 DIAGNOSIS — M79644 Pain in right finger(s): Secondary | ICD-10-CM | POA: Diagnosis not present

## 2014-05-02 DIAGNOSIS — S62629A Displaced fracture of medial phalanx of unspecified finger, initial encounter for closed fracture: Secondary | ICD-10-CM | POA: Diagnosis not present

## 2014-05-02 NOTE — Patient Instructions (Addendum)
Good to see you.  Increase vitamin D to 4000Iu daily for next 3 weeks.  Ice when hurting and can wrap to other finger if needed Wear brace at night for 10 days Will take another 2-3 weeks to be pain free.  See me if not perfect

## 2014-05-02 NOTE — Progress Notes (Signed)
[facility] [place], [place] [address] Phone: [phone] Subjective:    I'm seeing this patient by the request  of:  Walker Kehr, MD   CC: hand pain after fall  [id number] Tiffany Gibbs is a 69 y.o. female coming in with complaint of hand pain after fall. Patient does have a past medical history significant for fibromyalgia.patient fell on February 15 in [place].she landed on concrete. Patient fell on her right finger. Patient was seen by primary care provider and put in a splint. Patient states that she would like to have this plate removed. Not hurting her anymore. Patient states that then seems to be more tight than anything else. Pain she states was initially over the proximal interphalangeal joint. Patient states that her wrist is completely resolved the pain she was having previously.     Past Medical History  Diagnosis Date  . Anxiety   . Depression   . HTN (hypertension)   . Osteoarthritis   . DM type 2 (diabetes mellitus, type 2)   . Low back pain   . Osteoporosis   . Hyperlipidemia   . Hives     from tomatoes  . Fibromyalgia   . OSA (obstructive sleep apnea)     cataracts   Past Surgical History  Procedure Laterality Date  . Total abdominal hysterectomy    . Cervical cryotherapy      cervix  . 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 Lasik Bilateral   . Eye surgery    . Total hip arthroplasty Left 11/28/2012    Procedure: TOTAL HIP ARTHROPLASTY;  Surgeon: Kerin Salen, MD;  Location: [facility];  Service: Orthopedics;  Laterality: Left;   Family History  Problem Relation Age of Onset  . Hypertension Mother   . Heart disease Father   . Multiple sclerosis Father   . Heart attack Father    History  Substance Use Topics  . Smoking status: Former Smoker -- 2.00 packs/day for 25 years    Types: Cigarettes    Quit date: 01/13/1983  . Smokeless tobacco: Never Used  . Alcohol Use: No   Allergies    Allergen Reactions  . Celecoxib     swell  . Enalapril     cough  . Losartan     falls  . Lovastatin     REACTION: aches  . Naproxen      Past medical history, social, surgical and family history all reviewed in electronic medical record.   Review of Systems: No headache, visual changes, nausea, vomiting, diarrhea, constipation, dizziness, abdominal pain, skin rash, fevers, chills, night sweats, weight loss, swollen lymph nodes, body aches, joint swelling, muscle aches, chest pain, shortness of breath, mood changes.   Objective Blood pressure 130/62, pulse 64, height 5\' 5"  (1.651 m), weight 185 lb (83.915 kg), SpO2 96 %.  General: No apparent distress alert and oriented x3 mood and affect normal, dressed appropriately.  HEENT: Pupils equal, extraocular movements intact  Respiratory: Patient's speak in full sentences and does not appear short of breath  Cardiovascular: No lower extremity edema, non tender, no erythema  Skin: Warm dry intact with no signs of infection or rash on extremities or on axial skeleton.  Abdomen: Soft nontender  Neuro: Cranial nerves II through XII are intact, neurovascularly intact in all extremities with 2+ DTRs and 2+ pulses.  Lymph: No lymphadenopathy of posterior or anterior cervical chain or axillae bilaterally.  Gait normal with good balance and coordination.  MSK:  Non tender with full range of motion and good stability and symmetric strength and tone of shoulders, elbows, wrist, hip, knee and ankles bilaterally. Moderate osteophytic changes of multiple joints.  Patient's hand exam on the right hand has full range of motion of the wrist with full strength and neurovascular intact distally. Patient had splint removed from the middle finger. Very mild tightness of the middle finger but no angulation noted. Patient is nontender on exam today.  Limited musculoskeletal ultrasound was performed and interpreted by Hulan Saas, M  Limited ultrasound of the  proximal interphalangeal joint of the middle finger on the right hand shows the patient does have what appears to be callus formation just proximal to this joint. No significant angulation noted. Impression: healing fracture no displacement.      Impression and Recommendations:     This case required medical decision making of moderate complexity.

## 2014-05-02 NOTE — Progress Notes (Signed)
Pre visit review using our clinic review tool, if applicable. No additional management support is needed unless otherwise documented below in the visit note. 

## 2014-05-02 NOTE — Assessment & Plan Note (Signed)
Healing well, discussed icing as well as buddy taping. We discussed still bracing at night. Patient has any worsening symptoms she'll come back in 3 weeks but I expect her to heal appropriately.

## 2014-06-05 ENCOUNTER — Ambulatory Visit: Payer: Medicare HMO | Admitting: Family Medicine

## 2014-06-18 ENCOUNTER — Encounter: Payer: Self-pay | Admitting: Internal Medicine

## 2014-06-18 ENCOUNTER — Ambulatory Visit (INDEPENDENT_AMBULATORY_CARE_PROVIDER_SITE_OTHER): Payer: Medicare HMO | Admitting: Internal Medicine

## 2014-06-18 VITALS — BP 110/62 | HR 70 | Wt 184.0 lb

## 2014-06-18 DIAGNOSIS — E785 Hyperlipidemia, unspecified: Secondary | ICD-10-CM

## 2014-06-18 DIAGNOSIS — E538 Deficiency of other specified B group vitamins: Secondary | ICD-10-CM | POA: Diagnosis not present

## 2014-06-18 DIAGNOSIS — I10 Essential (primary) hypertension: Secondary | ICD-10-CM

## 2014-06-18 DIAGNOSIS — E119 Type 2 diabetes mellitus without complications: Secondary | ICD-10-CM | POA: Diagnosis not present

## 2014-06-18 DIAGNOSIS — F32A Depression, unspecified: Secondary | ICD-10-CM

## 2014-06-18 DIAGNOSIS — F329 Major depressive disorder, single episode, unspecified: Secondary | ICD-10-CM | POA: Diagnosis not present

## 2014-06-18 MED ORDER — CLOTRIMAZOLE-BETAMETHASONE 1-0.05 % EX CREA: 1.0000 "application " | TOPICAL_CREAM | Freq: Two times a day (BID) | CUTANEOUS | Status: DC

## 2014-06-18 NOTE — Progress Notes (Signed)
Pre visit review using our clinic review tool, if applicable. No additional management support is needed unless otherwise documented below in the visit note. 

## 2014-06-18 NOTE — Assessment & Plan Note (Signed)
Furosemide.

## 2014-06-18 NOTE — Assessment & Plan Note (Signed)
Statin intolerant Try Cholestoff

## 2014-06-18 NOTE — Assessment & Plan Note (Signed)
On B12 

## 2014-06-18 NOTE — Assessment & Plan Note (Signed)
Chronic On Metformin 

## 2014-06-18 NOTE — Assessment & Plan Note (Signed)
Chronic On Celexa

## 2014-06-18 NOTE — Progress Notes (Signed)
   Subjective:     HPI   Pt went to UC on 3/28 for r hand pain and swelling - she was given Clinda and Cipro. X ray was ok    The patient presents for a follow-up of  chronic hypertension, chronic dyslipidemia, pains, type 2 diabetes controlled with medicines.   F/u on memory issues better off flexeril and Tramadol    BP Readings from Last 3 Encounters:  06/18/14 110/62  05/02/14 130/62  04/19/14 118/70   Wt Readings from Last 3 Encounters:  06/18/14 184 lb (83.462 kg)  05/02/14 185 lb (83.915 kg)  04/19/14 186 lb (84.369 kg)    Review of Systems  Constitutional: Negative for activity change, appetite change, fatigue and unexpected weight change.  HENT: Negative for congestion, mouth sores and sinus pressure.   Eyes: Negative for visual disturbance.  Respiratory: Negative for chest tightness.   Genitourinary: Negative for frequency, difficulty urinating and vaginal pain.  Musculoskeletal: Negative for back pain and gait problem.  Skin: Negative for pallor.  Neurological: Negative for dizziness, tremors and weakness.  Psychiatric/Behavioral: Negative for confusion and sleep disturbance. The patient is not nervous/anxious.        Objective:   Physical Exam  Constitutional: She appears well-developed. No distress.  HENT:  Head: Normocephalic.  Right Ear: External ear normal.  Left Ear: External ear normal.  Nose: Nose normal.  Mouth/Throat: Oropharynx is clear and moist.  Eyes: Conjunctivae are normal. Pupils are equal, round, and reactive to light. Right eye exhibits no discharge. Left eye exhibits no discharge.  Neck: Normal range of motion. Neck supple. No JVD present. No tracheal deviation present. No thyromegaly present.  Cardiovascular: Normal rate, regular rhythm and normal heart sounds.   Pulmonary/Chest: No stridor. No respiratory distress. She has no wheezes.  Abdominal: Soft. Bowel sounds are normal. She exhibits no distension and no mass. There is no  tenderness. There is no rebound and no guarding.  Musculoskeletal: She exhibits no edema or tenderness.  Lymphadenopathy:    She has no cervical adenopathy.  Neurological: She displays normal reflexes. No cranial nerve deficit. She exhibits normal muscle tone. Coordination normal.  Skin: No rash noted. No erythema.  Psychiatric: She has a normal mood and affect. Her behavior is normal. Judgment and thought content normal.   B toes #1-2 are crossed     Lab Results  Component Value Date   WBC 5.2 02/12/2014   HGB 14.3 02/12/2014   HCT 41.5 02/12/2014   PLT 234.0 02/12/2014   GLUCOSE 92 04/19/2014   CHOL 226* 04/19/2014   TRIG 79.0 04/19/2014   HDL 66.90 04/19/2014   LDLDIRECT 148.2 02/03/2013   LDLCALC 143* 04/19/2014   ALT 12 04/19/2014   AST 16 04/19/2014   NA 138 04/19/2014   K 3.8 04/19/2014   CL 102 04/19/2014   CREATININE 0.97 04/19/2014   BUN 16 04/19/2014   CO2 30 04/19/2014   TSH 1.51 08/23/2013   INR 1.14 11/21/2012   HGBA1C 5.6 04/19/2014          Assessment & Plan:

## 2014-06-19 ENCOUNTER — Telehealth: Payer: Self-pay | Admitting: Internal Medicine

## 2014-06-19 NOTE — Telephone Encounter (Signed)
Caryl Pina from [facility] is calling regarding patient trying to get approval for tier exception for ciclopirox (PENLAC) 8 % solution [[id number]. She is wanting to know if patient has tried terbinafin and souconazole. And if so, does physician feel it is ineffective. Please call them at [phone]. Anyone who answers can help

## 2014-06-20 NOTE — Telephone Encounter (Signed)
I do not see terbinafine or sulconazole in med hx. Would either work for pt or proceed with PA//tier exception. Please advise.

## 2014-06-21 NOTE — Telephone Encounter (Signed)
She needs a topical med for nails. Needs PA Thx

## 2014-06-28 NOTE — Telephone Encounter (Signed)
PA for penlac started via covermymeds.

## 2014-07-02 ENCOUNTER — Other Ambulatory Visit: Payer: Self-pay | Admitting: Internal Medicine

## 2014-07-20 NOTE — Telephone Encounter (Signed)
Rec fax stating Clinical PA is not required for Ciclopirox nail lacquer. Pharmacy informed.

## 2014-08-06 ENCOUNTER — Telehealth: Payer: Self-pay | Admitting: Internal Medicine

## 2014-08-06 MED ORDER — CITALOPRAM HYDROBROMIDE 40 MG PO TABS: 40.0000 mg | ORAL_TABLET | Freq: Every day | ORAL | Status: DC

## 2014-08-06 NOTE — Telephone Encounter (Signed)
Patient is requesting 90 day supply with 3 refills of citralopram sent to [facility] on [address] in [place].

## 2014-08-06 NOTE — Telephone Encounter (Signed)
Done. See meds.  

## 2014-09-26 ENCOUNTER — Other Ambulatory Visit (INDEPENDENT_AMBULATORY_CARE_PROVIDER_SITE_OTHER): Payer: Medicare HMO

## 2014-09-26 ENCOUNTER — Ambulatory Visit (INDEPENDENT_AMBULATORY_CARE_PROVIDER_SITE_OTHER)
Admission: RE | Admit: 2014-09-26 | Discharge: 2014-09-26 | Disposition: A | Discharge status: Home / Self Care | Payer: Medicare HMO | Source: Ambulatory Visit | Attending: Internal Medicine | Admitting: Internal Medicine

## 2014-09-26 ENCOUNTER — Ambulatory Visit (INDEPENDENT_AMBULATORY_CARE_PROVIDER_SITE_OTHER): Payer: Medicare HMO | Admitting: Internal Medicine

## 2014-09-26 ENCOUNTER — Encounter: Payer: Self-pay | Admitting: Internal Medicine

## 2014-09-26 VITALS — BP 110/68 | HR 84 | Temp 97.8°F | Wt 184.0 lb

## 2014-09-26 DIAGNOSIS — J0100 Acute maxillary sinusitis, unspecified: Secondary | ICD-10-CM | POA: Diagnosis not present

## 2014-09-26 DIAGNOSIS — M25552 Pain in left hip: Secondary | ICD-10-CM | POA: Diagnosis not present

## 2014-09-26 DIAGNOSIS — M79661 Pain in right lower leg: Secondary | ICD-10-CM | POA: Diagnosis not present

## 2014-09-26 DIAGNOSIS — E119 Type 2 diabetes mellitus without complications: Secondary | ICD-10-CM | POA: Diagnosis not present

## 2014-09-26 DIAGNOSIS — R05 Cough: Secondary | ICD-10-CM | POA: Diagnosis not present

## 2014-09-26 DIAGNOSIS — J019 Acute sinusitis, unspecified: Secondary | ICD-10-CM | POA: Insufficient documentation

## 2014-09-26 DIAGNOSIS — G4451 Hemicrania continua: Secondary | ICD-10-CM

## 2014-09-26 DIAGNOSIS — I10 Essential (primary) hypertension: Secondary | ICD-10-CM | POA: Diagnosis not present

## 2014-09-26 DIAGNOSIS — R059 Cough, unspecified: Secondary | ICD-10-CM

## 2014-09-26 LAB — COMPREHENSIVE METABOLIC PANEL
ALK PHOS: 56 U/L (ref 39–117)
ALT: 13 U/L (ref 0–35)
AST: 16 U/L (ref 0–37)
Albumin: 4.5 g/dL (ref 3.5–5.2)
BILIRUBIN TOTAL: 0.5 mg/dL (ref 0.2–1.2)
BUN: 20 mg/dL (ref 6–23)
CO2: 28 mEq/L (ref 19–32)
CREATININE: 1.06 mg/dL (ref 0.40–1.20)
Calcium: 9.9 mg/dL (ref 8.4–10.5)
Chloride: 102 mEq/L (ref 96–112)
GFR: 54.59 mL/min — ABNORMAL LOW (ref >60.00)
GLUCOSE: 86 mg/dL (ref 70–99)
Potassium: 3.6 mEq/L (ref 3.5–5.1)
SODIUM: 140 meq/L (ref 135–145)
TOTAL PROTEIN: 7.7 g/dL (ref 6.0–8.3)

## 2014-09-26 LAB — HEMOGLOBIN A1C: Hgb A1c MFr Bld: 5.6 % (ref 4.6–6.5)

## 2014-09-26 MED ORDER — METHYLPREDNISOLONE ACETATE 80 MG/ML IJ SUSP
80.0000 mg | Freq: Once | INTRAMUSCULAR | Status: AC
  Administered 2014-09-26: 80 mg via INTRAMUSCULAR

## 2014-09-26 MED ORDER — LEVOFLOXACIN 500 MG PO TABS: 500.0000 mg | ORAL_TABLET | Freq: Every day | ORAL | Status: DC

## 2014-09-26 MED ORDER — PROMETHAZINE-CODEINE 6.25-10 MG/5ML PO SYRP: 5.0000 mL | ORAL_SOLUTION | ORAL | Status: DC | PRN

## 2014-09-26 MED ORDER — ACYCLOVIR 800 MG PO TABS: 800.0000 mg | ORAL_TABLET | Freq: Every day | ORAL | Status: DC

## 2014-09-26 MED ORDER — AMOXICILLIN-POT CLAVULANATE 875-125 MG PO TABS: 1.0000 | ORAL_TABLET | Freq: Two times a day (BID) | ORAL | Status: DC

## 2014-09-26 NOTE — Progress Notes (Signed)
Pre visit review using our clinic review tool, if applicable. No additional management support is needed unless otherwise documented below in the visit note. 

## 2014-09-26 NOTE — Assessment & Plan Note (Signed)
Levaquin - too $$ Augmentin Rx

## 2014-09-26 NOTE — Progress Notes (Signed)
Subjective:  Patient ID: Tiffany Gibbs, female    DOB: 1945/03/17  Age: 69 y.o. MRN: [id number]  CC: Headache; Cough; and Hip Pain   HPI Tiffany Gibbs presents for cough, sinus HA, brown mucus x1 month. C/o R hip pain irrad down the leg since last night - severe.  Outpatient Prescriptions Prior to Visit  Medication Sig Dispense Refill  . aspirin EC 81 MG tablet Take 1 tablet (81 mg total) by mouth daily. 100 tablet 3  . Cholecalciferol (VITAMIN D) 1000 UNITS capsule Take 1 capsule (1,000 Units total) by mouth daily. 100 capsule 3  . ciclopirox (PENLAC) 8 % solution Apply topically at bedtime. Apply over nail and surrounding skin. Apply daily over previous coat. After seven (7) days, may remove with alcohol and continue cycle. 6.6 mL 3  . citalopram (CELEXA) 40 MG tablet Take 1 tablet (40 mg total) by mouth daily. 90 tablet 3  . clotrimazole-betamethasone (LOTRISONE) cream Apply 1 application topically 2 (two) times daily. 45 g 1  . Cyanocobalamin (VITAMIN B-12) 1000 MCG SUBL Place 1 tablet under the tongue daily.     . diphenhydrAMINE (BENADRYL) 25 MG tablet Take 25 mg by mouth at bedtime.    . fluticasone (FLONASE) 50 MCG/ACT nasal spray Place 1 spray into both nostrils daily as needed for allergies or rhinitis.    . furosemide (LASIX) 40 MG tablet TAKE ONE TABLET BY MOUTH ONCE DAILY 90 tablet 3  . loratadine (CLARITIN) 10 MG tablet Take 10 mg by mouth daily.    . meloxicam (MOBIC) 15 MG tablet Take 1 tablet (15 mg total) by mouth daily as needed for pain. 30 tablet 1  . metFORMIN (GLUCOPHAGE) 500 MG tablet Take 1 tablet (500 mg total) by mouth 2 (two) times daily with a meal. 180 tablet 2  . Omega-3 Fatty Acids (FISH OIL) 1200 MG CAPS Take 2,400 mg by mouth 2 (two) times daily.    [name] triamcinolone cream (KENALOG) 0.5 % Apply 1 application topically 3 (three) times daily as needed (to rash).     No facility-administered medications prior to visit.    ROS Review of Systems    Constitutional: Positive for chills. Negative for activity change, appetite change, fatigue and unexpected weight change.  HENT: Positive for rhinorrhea, sinus pressure and sore throat. Negative for congestion, ear discharge and mouth sores.   Eyes: Negative for visual disturbance.  Respiratory: Positive for cough. Negative for chest tightness, shortness of breath and wheezing.   Gastrointestinal: Negative for nausea and abdominal pain.  Genitourinary: Negative for frequency, difficulty urinating and vaginal pain.  Musculoskeletal: Positive for arthralgias. Negative for back pain, gait problem and neck stiffness.  Skin: Negative for pallor and rash.  Neurological: Negative for dizziness, tremors, weakness, numbness and headaches.  Psychiatric/Behavioral: Negative for suicidal ideas, confusion and sleep disturbance.    Objective:  BP 110/68 mmHg  Pulse 84  Temp(Src) 97.8 F (36.6 C) (Oral)  Wt 184 lb (83.462 kg)  SpO2 98%  BP Readings from Last 3 Encounters:  09/26/14 110/68  06/18/14 110/62  05/02/14 130/62    Wt Readings from Last 3 Encounters:  09/26/14 184 lb (83.462 kg)  06/18/14 184 lb (83.462 kg)  05/02/14 185 lb (83.915 kg)    Physical Exam  Constitutional: She appears well-developed. No distress.  HENT:  Head: Normocephalic.  Right Ear: External ear normal.  Left Ear: External ear normal.  Nose: Nose normal.  Mouth/Throat: Oropharynx is clear and moist.  Eyes: Conjunctivae  are normal. Pupils are equal, round, and reactive to light. Right eye exhibits no discharge. Left eye exhibits no discharge.  Neck: Normal range of motion. Neck supple. No JVD present. No tracheal deviation present. No thyromegaly present.  Cardiovascular: Normal rate, regular rhythm and normal heart sounds.   Pulmonary/Chest: No stridor. No respiratory distress. She has no wheezes.  Abdominal: Soft. Bowel sounds are normal. She exhibits no distension and no mass. There is no tenderness. There  is no rebound and no guarding.  Musculoskeletal: She exhibits no edema or tenderness.  Lymphadenopathy:    She has no cervical adenopathy.  Neurological: She displays normal reflexes. No cranial nerve deficit. She exhibits normal muscle tone. Coordination normal.  Skin: No rash noted. No erythema.  Psychiatric: She has a normal mood and affect. Her behavior is normal. Judgment and thought content normal.    Lab Results  Component Value Date   WBC 5.2 02/12/2014   HGB 14.3 02/12/2014   HCT 41.5 02/12/2014   PLT 234.0 02/12/2014   GLUCOSE 86 09/26/2014   CHOL 226* 04/19/2014   TRIG 79.0 04/19/2014   HDL 66.90 04/19/2014   LDLDIRECT 148.2 02/03/2013   LDLCALC 143* 04/19/2014   ALT 13 09/26/2014   AST 16 09/26/2014   NA 140 09/26/2014   K 3.6 09/26/2014   CL 102 09/26/2014   CREATININE 1.06 09/26/2014   BUN 20 09/26/2014   CO2 28 09/26/2014   TSH 1.51 08/23/2013   INR 1.14 11/21/2012   HGBA1C 5.6 09/26/2014    No results found.  Assessment & Plan:   Tiffany Gibbs was seen today for headache, cough and hip pain.  Diagnoses and all orders for this visit:  Cough -     DG Chest 2 View  Hemicrania continua  Pain of right lower leg -     methylPREDNISolone acetate (DEPO-MEDROL) injection 80 mg; Inject 1 mL (80 mg total) into the muscle once.  Acute maxillary sinusitis, recurrence not specified  Type 2 diabetes mellitus without complication -     Hemoglobin A1c; Future  Left hip pain  Other orders -     acyclovir (ZOVIRAX) 800 MG tablet; Take 1 tablet (800 mg total) by mouth 5 (five) times daily. -     Discontinue: levofloxacin (LEVAQUIN) 500 MG tablet; Take 1 tablet (500 mg total) by mouth daily. -     promethazine-codeine (PHENERGAN WITH CODEINE) 6.25-10 MG/5ML syrup; Take 5 mLs by mouth every 4 (four) hours as needed. -     amoxicillin-clavulanate (AUGMENTIN) 875-125 MG per tablet; Take 1 tablet by mouth 2 (two) times daily.  I have discontinued [name]'s  levofloxacin. I am also having her start on acyclovir, promethazine-codeine, and amoxicillin-clavulanate. Additionally, I am having her maintain her Vitamin B-12, Vitamin D, Fish Oil, diphenhydrAMINE, loratadine, fluticasone, triamcinolone cream, ciclopirox, metFORMIN, aspirin EC, meloxicam, clotrimazole-betamethasone, furosemide, and citalopram. We administered methylPREDNISolone acetate.  Meds ordered this encounter  Medications  . acyclovir (ZOVIRAX) 800 MG tablet    Sig: Take 1 tablet (800 mg total) by mouth 5 (five) times daily.    Dispense:  50 tablet    Refill:  0  . DISCONTD: levofloxacin (LEVAQUIN) 500 MG tablet    Sig: Take 1 tablet (500 mg total) by mouth daily.    Dispense:  10 tablet    Refill:  0  . promethazine-codeine (PHENERGAN WITH CODEINE) 6.25-10 MG/5ML syrup    Sig: Take 5 mLs by mouth every 4 (four) hours as needed.    Dispense:  300 mL    Refill:  0  . amoxicillin-clavulanate (AUGMENTIN) 875-125 MG per tablet    Sig: Take 1 tablet by mouth 2 (two) times daily.    Dispense:  20 tablet    Refill:  0    In place of levaquin - Thx!  . methylPREDNISolone acetate (DEPO-MEDROL) injection 80 mg    Sig:      Follow-up: Return in about 2 weeks (around 10/10/2014) for a follow-up visit.  Walker Kehr, MD

## 2014-09-26 NOTE — Assessment & Plan Note (Signed)
9/16 ?sinusitis Levaquin x10 d Depomedrol 80 mg IM

## 2014-09-26 NOTE — Assessment & Plan Note (Signed)
CXR Levaquin

## 2014-09-26 NOTE — Assessment & Plan Note (Signed)
LLE sciatica vs shingles 9/16 Acyclovir if rash

## 2014-10-02 ENCOUNTER — Ambulatory Visit: Payer: Medicare HMO | Admitting: Internal Medicine

## 2014-10-02 LAB — HM MAMMOGRAPHY

## 2014-10-04 ENCOUNTER — Encounter: Payer: Self-pay | Admitting: Internal Medicine

## 2014-10-15 ENCOUNTER — Encounter: Payer: Self-pay | Admitting: Internal Medicine

## 2014-10-15 ENCOUNTER — Ambulatory Visit (INDEPENDENT_AMBULATORY_CARE_PROVIDER_SITE_OTHER): Payer: Medicare HMO | Admitting: Internal Medicine

## 2014-10-15 VITALS — BP 118/80 | HR 62 | Wt 182.0 lb

## 2014-10-15 DIAGNOSIS — M542 Cervicalgia: Secondary | ICD-10-CM | POA: Diagnosis not present

## 2014-10-15 DIAGNOSIS — J4520 Mild intermittent asthma, uncomplicated: Secondary | ICD-10-CM | POA: Diagnosis not present

## 2014-10-15 DIAGNOSIS — J45909 Unspecified asthma, uncomplicated: Secondary | ICD-10-CM | POA: Insufficient documentation

## 2014-10-15 DIAGNOSIS — I10 Essential (primary) hypertension: Secondary | ICD-10-CM

## 2014-10-15 DIAGNOSIS — Z23 Encounter for immunization: Secondary | ICD-10-CM | POA: Diagnosis not present

## 2014-10-15 MED ORDER — DOXYCYCLINE HYCLATE 100 MG PO TABS: 100.0000 mg | ORAL_TABLET | Freq: Two times a day (BID) | ORAL | Status: DC

## 2014-10-15 MED ORDER — UMECLIDINIUM-VILANTEROL 62.5-25 MCG/INH IN AEPB: 1.0000 | INHALATION_SPRAY | Freq: Every day | RESPIRATORY_TRACT | Status: DC

## 2014-10-15 MED ORDER — IBUPROFEN 600 MG PO TABS: 600.0000 mg | ORAL_TABLET | Freq: Two times a day (BID) | ORAL | Status: DC | PRN

## 2014-10-15 MED ORDER — PROMETHAZINE-CODEINE 6.25-10 MG/5ML PO SYRP: 5.0000 mL | ORAL_SOLUTION | ORAL | Status: DC | PRN

## 2014-10-15 NOTE — Assessment & Plan Note (Signed)
MSK Ibuprofen prn pc

## 2014-10-15 NOTE — Progress Notes (Signed)
Subjective:  Patient ID: Tiffany Gibbs, female    DOB: 01-23-1945  Age: 69 y.o. MRN: [id number]  CC: No chief complaint on file.   HPI Tiffany Gibbs presents for cough, sinus HA, brown mucus x1.5+ month - not better w/Levaquin x10 d. F/u  R hip pain irrad down the leg - resolved.  Outpatient Prescriptions Prior to Visit  Medication Sig Dispense Refill  . aspirin EC 81 MG tablet Take 1 tablet (81 mg total) by mouth daily. 100 tablet 3  . Cholecalciferol (VITAMIN D) 1000 UNITS capsule Take 1 capsule (1,000 Units total) by mouth daily. 100 capsule 3  . ciclopirox (PENLAC) 8 % solution Apply topically at bedtime. Apply over nail and surrounding skin. Apply daily over previous coat. After seven (7) days, may remove with alcohol and continue cycle. 6.6 mL 3  . citalopram (CELEXA) 40 MG tablet Take 1 tablet (40 mg total) by mouth daily. 90 tablet 3  . clotrimazole-betamethasone (LOTRISONE) cream Apply 1 application topically 2 (two) times daily. 45 g 1  . Cyanocobalamin (VITAMIN B-12) 1000 MCG SUBL Place 5,000 tablets under the tongue daily.     . diphenhydrAMINE (BENADRYL) 25 MG tablet Take 25 mg by mouth at bedtime.    . fluticasone (FLONASE) 50 MCG/ACT nasal spray Place 1 spray into both nostrils daily as needed for allergies or rhinitis.    . furosemide (LASIX) 40 MG tablet TAKE ONE TABLET BY MOUTH ONCE DAILY 90 tablet 3  . loratadine (CLARITIN) 10 MG tablet Take 10 mg by mouth daily.    . metFORMIN (GLUCOPHAGE) 500 MG tablet Take 1 tablet (500 mg total) by mouth 2 (two) times daily with a meal. 180 tablet 2  . Omega-3 Fatty Acids (FISH OIL) 1200 MG CAPS Take 2,400 mg by mouth 2 (two) times daily.    [name] triamcinolone cream (KENALOG) 0.5 % Apply 1 application topically 3 (three) times daily as needed (to rash).    [name] amoxicillin-clavulanate (AUGMENTIN) 875-125 MG per tablet Take 1 tablet by mouth 2 (two) times daily. 20 tablet 0  . acyclovir (ZOVIRAX) 800 MG tablet Take 1 tablet (800 mg total)  by mouth 5 (five) times daily. (Patient not taking: Reported on 10/15/2014) 50 tablet 0  . meloxicam (MOBIC) 15 MG tablet Take 1 tablet (15 mg total) by mouth daily as needed for pain. (Patient not taking: Reported on 10/15/2014) 30 tablet 1  . promethazine-codeine (PHENERGAN WITH CODEINE) 6.25-10 MG/5ML syrup Take 5 mLs by mouth every 4 (four) hours as needed. (Patient not taking: Reported on 10/15/2014) 300 mL 0   No facility-administered medications prior to visit.    ROS Review of Systems  Constitutional: Positive for chills. Negative for activity change, appetite change, fatigue and unexpected weight change.  HENT: Positive for rhinorrhea, sinus pressure and sore throat. Negative for congestion, ear discharge and mouth sores.   Eyes: Negative for visual disturbance.  Respiratory: Positive for cough. Negative for chest tightness, shortness of breath and wheezing.   Gastrointestinal: Negative for nausea and abdominal pain.  Genitourinary: Negative for frequency, difficulty urinating and vaginal pain.  Musculoskeletal: Positive for arthralgias. Negative for back pain, gait problem and neck stiffness.  Skin: Negative for pallor and rash.  Neurological: Negative for dizziness, tremors, weakness, numbness and headaches.  Psychiatric/Behavioral: Negative for suicidal ideas, confusion and sleep disturbance.    Objective:  BP 118/80 mmHg  Pulse 62  Wt 182 lb (82.555 kg)  SpO2 98%  BP Readings from Last 3 Encounters:  10/15/14 118/80  09/26/14 110/68  06/18/14 110/62    Wt Readings from Last 3 Encounters:  10/15/14 182 lb (82.555 kg)  09/26/14 184 lb (83.462 kg)  06/18/14 184 lb (83.462 kg)    Physical Exam  Constitutional: She appears well-developed. No distress.  HENT:  Head: Normocephalic.  Right Ear: External ear normal.  Left Ear: External ear normal.  Nose: Nose normal.  Mouth/Throat: Oropharynx is clear and moist.  Eyes: Conjunctivae are normal. Pupils are equal, round,  and reactive to light. Right eye exhibits no discharge. Left eye exhibits no discharge.  Neck: Normal range of motion. Neck supple. No JVD present. No tracheal deviation present. No thyromegaly present.  Cardiovascular: Normal rate, regular rhythm and normal heart sounds.   Pulmonary/Chest: No stridor. No respiratory distress. She has no wheezes.  Abdominal: Soft. Bowel sounds are normal. She exhibits no distension and no mass. There is no tenderness. There is no rebound and no guarding.  Musculoskeletal: She exhibits no edema or tenderness.  Lymphadenopathy:    She has no cervical adenopathy.  Neurological: She displays normal reflexes. No cranial nerve deficit. She exhibits normal muscle tone. Coordination normal.  Skin: No rash noted. No erythema.  Psychiatric: She has a normal mood and affect. Her behavior is normal. Judgment and thought content normal.    Lab Results  Component Value Date   WBC 5.2 02/12/2014   HGB 14.3 02/12/2014   HCT 41.5 02/12/2014   PLT 234.0 02/12/2014   GLUCOSE 86 09/26/2014   CHOL 226* 04/19/2014   TRIG 79.0 04/19/2014   HDL 66.90 04/19/2014   LDLDIRECT 148.2 02/03/2013   LDLCALC 143* 04/19/2014   ALT 13 09/26/2014   AST 16 09/26/2014   NA 140 09/26/2014   K 3.6 09/26/2014   CL 102 09/26/2014   CREATININE 1.06 09/26/2014   BUN 20 09/26/2014   CO2 28 09/26/2014   TSH 1.51 08/23/2013   INR 1.14 11/21/2012   HGBA1C 5.6 09/26/2014   I personally provided Anoro inhaler use teaching. After the teaching patient was able to demonstrate it's use effectively. All questions were answered  No results found.  Assessment & Plan:   Diagnoses and all orders for this visit:  Asthmatic bronchitis, mild intermittent, uncomplicated  Neck pain on right side  Hypertension, essential  Need for influenza vaccination -     Flu Vaccine QUAD 36+ mos IM  Other orders -     Umeclidinium-Vilanterol (ANORO ELLIPTA) 62.5-25 MCG/INH AEPB; Inhale 1 Act into the  lungs daily. -     promethazine-codeine (PHENERGAN WITH CODEINE) 6.25-10 MG/5ML syrup; Take 5 mLs by mouth every 4 (four) hours as needed. -     doxycycline (VIBRA-TABS) 100 MG tablet; Take 1 tablet (100 mg total) by mouth 2 (two) times daily. -     ibuprofen (ADVIL,MOTRIN) 600 MG tablet; Take 1 tablet (600 mg total) by mouth 2 (two) times daily as needed.  I have discontinued Ms. Arndt's meloxicam, acyclovir, and amoxicillin-clavulanate. I am also having her start on Umeclidinium-Vilanterol, doxycycline, and ibuprofen. Additionally, I am having her maintain her Vitamin B-12, Vitamin D, Fish Oil, diphenhydrAMINE, loratadine, fluticasone, triamcinolone cream, ciclopirox, metFORMIN, aspirin EC, clotrimazole-betamethasone, furosemide, citalopram, and promethazine-codeine.  Meds ordered this encounter  Medications  . Umeclidinium-Vilanterol (ANORO ELLIPTA) 62.5-25 MCG/INH AEPB    Sig: Inhale 1 Act into the lungs daily.    Dispense:  1 each    Refill:  3  . promethazine-codeine (PHENERGAN WITH CODEINE) 6.25-10 MG/5ML syrup  Sig: Take 5 mLs by mouth every 4 (four) hours as needed.    Dispense:  300 mL    Refill:  0  . doxycycline (VIBRA-TABS) 100 MG tablet    Sig: Take 1 tablet (100 mg total) by mouth 2 (two) times daily.    Dispense:  20 tablet    Refill:  0  . ibuprofen (ADVIL,MOTRIN) 600 MG tablet    Sig: Take 1 tablet (600 mg total) by mouth 2 (two) times daily as needed.    Dispense:  60 tablet    Refill:  3     Follow-up: No Follow-up on file.  Walker Kehr, MD

## 2014-10-15 NOTE — Assessment & Plan Note (Signed)
Chronic Furosemide prn - hold for now

## 2014-10-15 NOTE — Patient Instructions (Signed)
Furosemide - hold for now

## 2014-10-15 NOTE — Assessment & Plan Note (Signed)
24/46 complicating URI Given Anoro qd Prom-cod Doxy x 10 d

## 2014-10-15 NOTE — Progress Notes (Signed)
Pre visit review using our clinic review tool, if applicable. No additional management support is needed unless otherwise documented below in the visit note. 

## 2014-10-18 ENCOUNTER — Encounter: Payer: Self-pay | Admitting: Internal Medicine

## 2014-11-05 DIAGNOSIS — R69 Illness, unspecified: Secondary | ICD-10-CM | POA: Diagnosis not present

## 2014-11-08 ENCOUNTER — Telehealth: Payer: Self-pay

## 2014-11-08 NOTE — Telephone Encounter (Signed)
Patient called to educate on Medicare Wellness apt. LVM for the patient to call back to educate and schedule for wellness visit.   

## 2014-11-09 NOTE — Telephone Encounter (Signed)
Call back and requested to come in prior to apt and agreed to come in at 12:15;  Placed on schedule on 11/7 at 12;15

## 2014-11-18 IMAGING — CR DG CHEST 2V
2 series · 2 of 2 positions shown · non-contrast
Comparison: 09/13/2008

CLINICAL DATA: Cough.

CHEST - 2 VIEW

[view not recorded (1 of 2)]
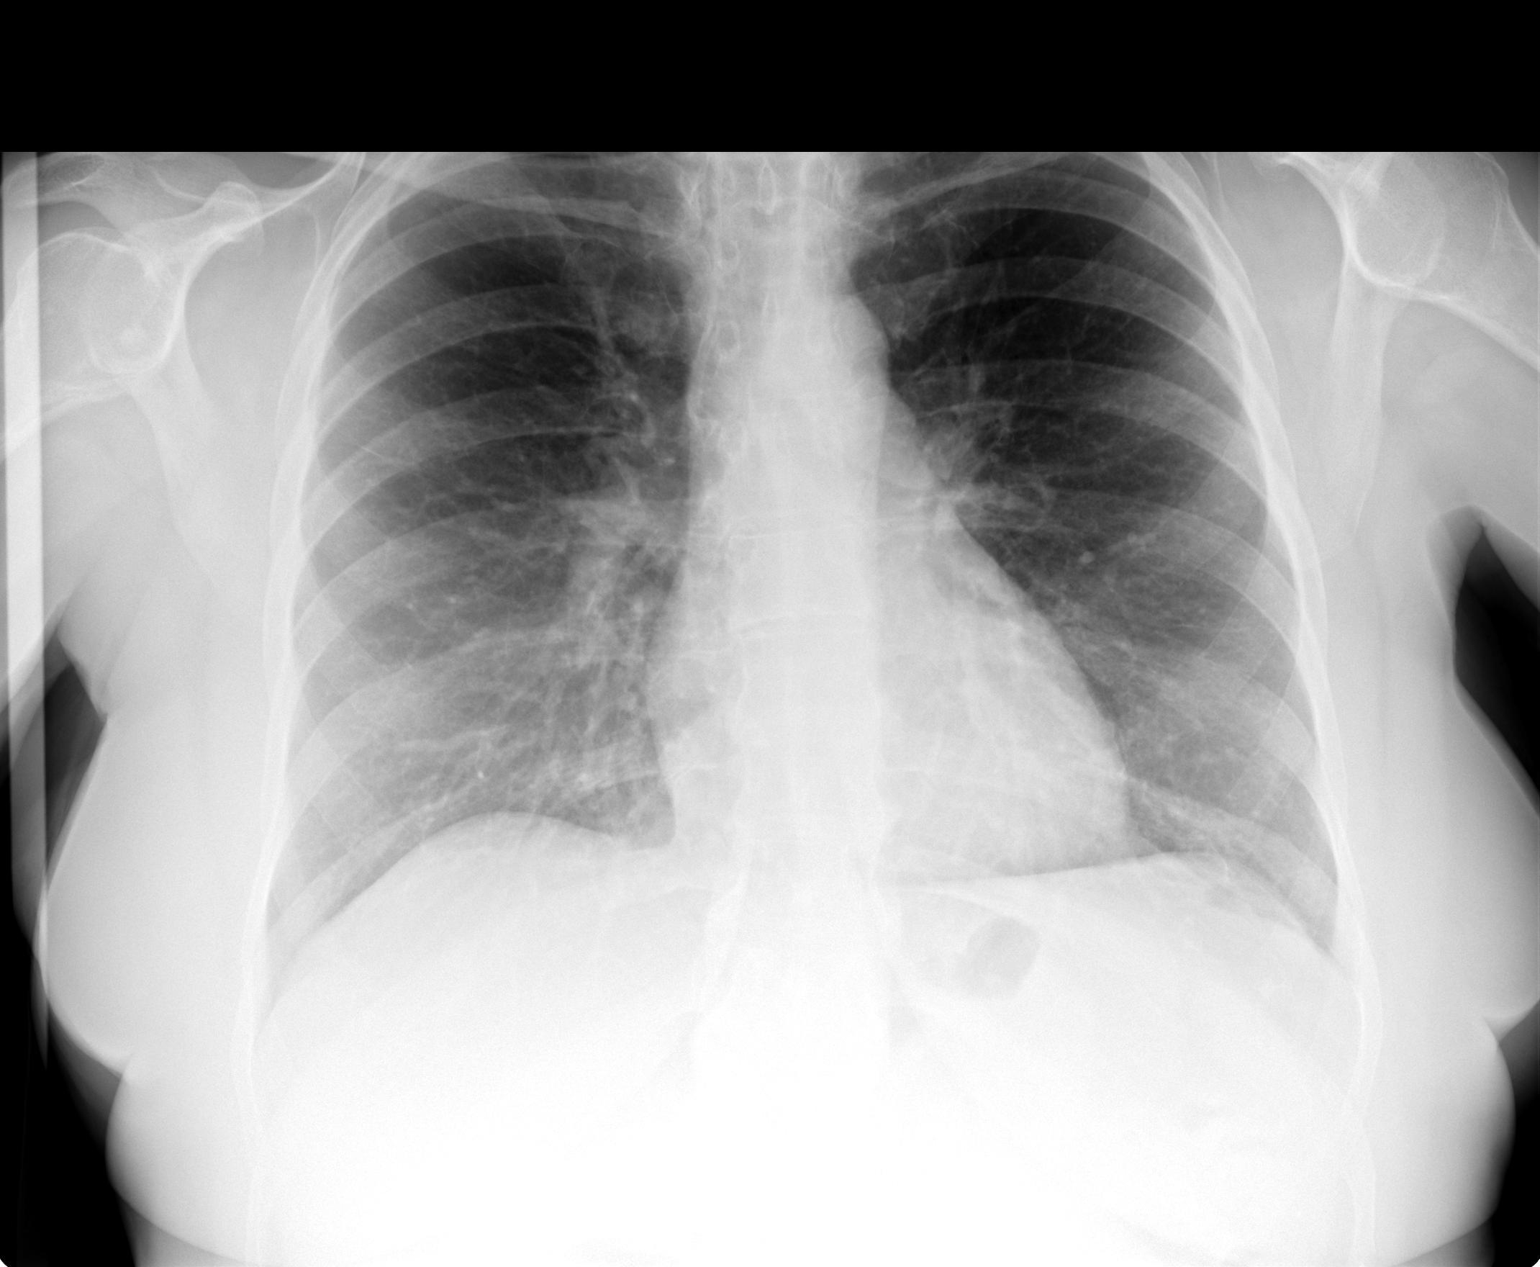

[view not recorded (2 of 2)]
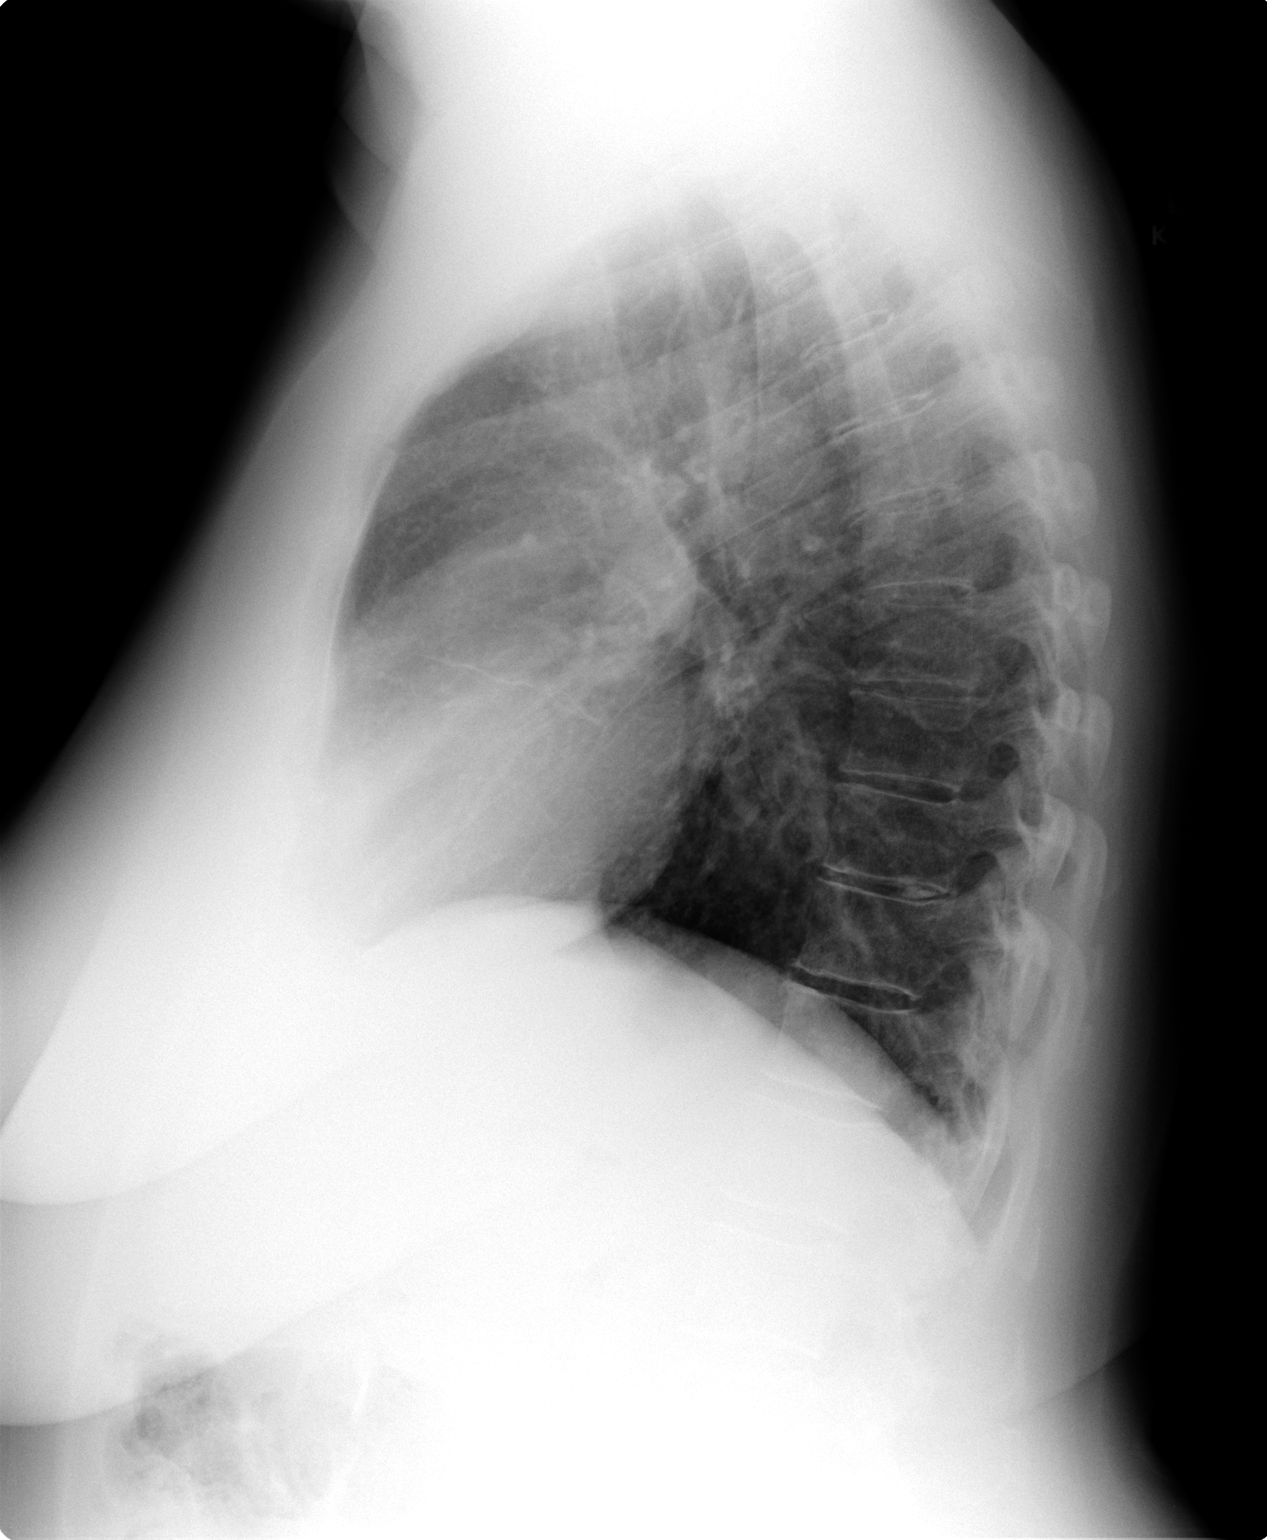

[2 of 2 positions shown; findings below may reference images not displayed]

FINDINGS: The heart size and mediastinal contours are within normal
limits.  Both lungs are clear.  The visualized skeletal structures
are unremarkable.
IMPRESSION: No active disease.

## 2014-11-19 ENCOUNTER — Other Ambulatory Visit (INDEPENDENT_AMBULATORY_CARE_PROVIDER_SITE_OTHER): Payer: Medicare HMO

## 2014-11-19 ENCOUNTER — Encounter: Payer: Self-pay | Admitting: Internal Medicine

## 2014-11-19 ENCOUNTER — Ambulatory Visit (INDEPENDENT_AMBULATORY_CARE_PROVIDER_SITE_OTHER): Payer: Medicare HMO | Admitting: Internal Medicine

## 2014-11-19 VITALS — BP 124/70 | HR 77 | Temp 97.9°F | Ht 63.5 in | Wt 182.2 lb

## 2014-11-19 DIAGNOSIS — J4520 Mild intermittent asthma, uncomplicated: Secondary | ICD-10-CM | POA: Diagnosis not present

## 2014-11-19 DIAGNOSIS — F411 Generalized anxiety disorder: Secondary | ICD-10-CM | POA: Diagnosis not present

## 2014-11-19 DIAGNOSIS — I8002 Phlebitis and thrombophlebitis of superficial vessels of left lower extremity: Secondary | ICD-10-CM | POA: Diagnosis not present

## 2014-11-19 DIAGNOSIS — R413 Other amnesia: Secondary | ICD-10-CM

## 2014-11-19 DIAGNOSIS — Z0000 Encounter for general adult medical examination without abnormal findings: Secondary | ICD-10-CM

## 2014-11-19 DIAGNOSIS — E538 Deficiency of other specified B group vitamins: Secondary | ICD-10-CM | POA: Diagnosis not present

## 2014-11-19 DIAGNOSIS — R69 Illness, unspecified: Secondary | ICD-10-CM | POA: Diagnosis not present

## 2014-11-19 DIAGNOSIS — I10 Essential (primary) hypertension: Secondary | ICD-10-CM

## 2014-11-19 DIAGNOSIS — E119 Type 2 diabetes mellitus without complications: Secondary | ICD-10-CM

## 2014-11-19 LAB — CBC WITH DIFFERENTIAL/PLATELET
BASOS ABS: 0 10*3/uL (ref 0.0–0.1)
Basophils Relative: 0.5 % (ref 0.0–3.0)
EOS ABS: 0.1 10*3/uL (ref 0.0–0.7)
Eosinophils Relative: 1.3 % (ref 0.0–5.0)
HEMATOCRIT: 47.2 % — AB (ref 36.0–46.0)
Hemoglobin: 16 g/dL — ABNORMAL HIGH (ref 12.0–15.0)
LYMPHS PCT: 26.5 % (ref 12.0–46.0)
Lymphs Abs: 2.5 10*3/uL (ref 0.7–4.0)
MCHC: 34 g/dL (ref 30.0–36.0)
MCV: 89.1 fl (ref 78.0–100.0)
MONO ABS: 0.9 10*3/uL (ref 0.1–1.0)
Monocytes Relative: 9.4 % (ref 3.0–12.0)
Neutro Abs: 5.9 10*3/uL (ref 1.4–7.7)
Neutrophils Relative %: 62.3 % (ref 43.0–77.0)
PLATELETS: 280 10*3/uL (ref 150.0–400.0)
RBC: 5.3 Mil/uL — ABNORMAL HIGH (ref 3.87–5.11)
RDW: 13.1 % (ref 11.5–15.5)
WBC: 9.4 10*3/uL (ref 4.0–10.5)

## 2014-11-19 LAB — URINALYSIS, ROUTINE W REFLEX MICROSCOPIC
BILIRUBIN URINE: NEGATIVE
KETONES UR: NEGATIVE
LEUKOCYTES UA: NEGATIVE
NITRITE: NEGATIVE
PH: 6 (ref 5.0–8.0)
SPECIFIC GRAVITY, URINE: 1.015 (ref 1.000–1.030)
Total Protein, Urine: NEGATIVE
URINE GLUCOSE: NEGATIVE
Urobilinogen, UA: 0.2 (ref 0.0–1.0)

## 2014-11-19 LAB — BASIC METABOLIC PANEL
BUN: 17 mg/dL (ref 6–23)
CALCIUM: 10.1 mg/dL (ref 8.4–10.5)
CO2: 29 meq/L (ref 19–32)
CREATININE: 1.07 mg/dL (ref 0.40–1.20)
Chloride: 99 mEq/L (ref 96–112)
GFR: 53.98 mL/min — ABNORMAL LOW (ref >60.00)
Glucose, Bld: 96 mg/dL (ref 70–99)
Potassium: 3.6 mEq/L (ref 3.5–5.1)
Sodium: 139 mEq/L (ref 135–145)

## 2014-11-19 LAB — TSH: TSH: 2.01 u[IU]/mL (ref 0.35–4.50)

## 2014-11-19 LAB — LIPID PANEL
CHOL/HDL RATIO: 4
Cholesterol: 291 mg/dL — ABNORMAL HIGH (ref 0–200)
HDL: 75.8 mg/dL (ref >39.00)
LDL CALC: 191 mg/dL — AB (ref 0–99)
NONHDL: 214.81
Triglycerides: 120 mg/dL (ref 0.0–149.0)
VLDL: 24 mg/dL (ref 0.0–40.0)

## 2014-11-19 LAB — HEMOGLOBIN A1C: HEMOGLOBIN A1C: 5.7 % (ref 4.6–6.5)

## 2014-11-19 LAB — HEPATIC FUNCTION PANEL
ALK PHOS: 57 U/L (ref 39–117)
ALT: 17 U/L (ref 0–35)
AST: 18 U/L (ref 0–37)
Albumin: 4.7 g/dL (ref 3.5–5.2)
BILIRUBIN DIRECT: 0.1 mg/dL (ref 0.0–0.3)
BILIRUBIN TOTAL: 0.7 mg/dL (ref 0.2–1.2)
TOTAL PROTEIN: 8 g/dL (ref 6.0–8.3)

## 2014-11-19 LAB — VITAMIN B12

## 2014-11-19 MED ORDER — CITALOPRAM HYDROBROMIDE 40 MG PO TABS: 40.0000 mg | ORAL_TABLET | Freq: Every day | ORAL | Status: DC

## 2014-11-19 MED ORDER — FUROSEMIDE 40 MG PO TABS: 40.0000 mg | ORAL_TABLET | Freq: Every day | ORAL | Status: DC

## 2014-11-19 MED ORDER — METFORMIN HCL 500 MG PO TABS: 500.0000 mg | ORAL_TABLET | Freq: Two times a day (BID) | ORAL | Status: DC

## 2014-11-19 NOTE — Progress Notes (Signed)
Subjective:   Tiffany Gibbs is a 69 y.o. female who presents for Medicare Annual (Subsequent) preventive examination.  Review of Systems: deferred to Dr. Alain Marion; Recently seen for episode of Bronchitis;  New inhaler was expensive but helped her;  C/o h/a and leg pain x 7 weeks (Oct); Still has cramps in legs;  On Oct had leg cramps and h/a 26th to 28th; h/a stopped but leg cramps continue; then on 11/5 got another h/a and left leg cramps;  Ruled out clots per the patient;   Mother died at 57 diabetic and had a clot Father he had 33; heart failure; MI' had MS Brother; diabetes    The Patient was informed that this wellness visit is to identify risk and educate on how to reduce risk for increase disease through lifestyle changes.    Medical issues HTN; Phlebitis; bronchitis;  B12 def;  DM2 and controlled with metformin 500 mg bid; A1c 09/2014 was 5.6 Cholesterol;  OA; Osteoporosis/ Declines medications;  Hx of tobacco use  BMI:  Diet; oatmeal in the am with fruit and nuts and cinnamon. Lunch; sandwich; ham or lettuce and tomato; eats lots of salads;  Supper; sometimes they go out; oysters; generally eats beef and rice;  Green peppers and yellow peppers; does not use salt. Chicken; salmon; shrimp pasta; vegetarian pasta;  Visited a nutritionist this year; monitors carbs at each meal  Has thought about Rickard Patience;   Exercise; Busy schedule   Memory; Brought in an article on memory; Ad8 score is 0 Forget why she went into a room;  Spouse is concerned but she takes care of all the household management; animals; Ad8 score is 0;  Relaxation; normal routine; volunteers at community [employer] Walks the dog Cleans the house Visits girlfriend in the afternoons; watches movies to relax    SAFETY Safety reviewed for the home; including removal of clutter; clear paths through the home, eliminating clutter, railing as needed; bathroom safety; community safety; smoke detectors and  firearms safety as well as sun protection;  Driving accidents and seatbelt Sun protection Stressors;   Medication review/ New meds   Advanced Directive; Yes    Counseling: Hep C screening Ua microalbumin Colonoscopy; no record;   Thinks she had it at 69yo Needs referral for colonoscopy vs Cologuard   EKG: 11/21/2012 Hearing: sufficient Dexa / do not see dexa on flie; due 2012 per epic; Mammagram 10/02/2014 no evidence of cancer Ophthalmology exam; Diabetic eye exam 11/14/2013 / Plans to repeat>   Immunizations Due  Zostavax; asked about shingles and will discuss with Dr. Alain Marion    Cardiac Risk Factors include: advanced age (>13men, >51 women);dyslipidemia;hypertension     Objective:     Vitals: BP 124/70 mmHg  Pulse 77  Temp(Src) 97.9 F (36.6 C) (Oral)  Ht 5' 3.5" (1.613 m)  Wt 182 lb 4 oz (82.668 kg)  BMI 31.77 kg/m2  SpO2 97%  Tobacco History  Smoking status  . Former Smoker -- 2.00 packs/day for 25 years  . Types: Cigarettes  . Quit date: 01/13/1983  Smokeless tobacco  . Never Used     Counseling given: Yes   Past Medical History  Diagnosis Date  . Anxiety   . Depression   . HTN (hypertension)   . Osteoarthritis   . DM type 2 (diabetes mellitus, type 2) ([place])   . Low back pain   . Osteoporosis   . Hyperlipidemia   . Hives     from tomatoes  .  Fibromyalgia   . OSA (obstructive sleep apnea)     cataracts   Past Surgical History  Procedure Laterality Date  . Total abdominal hysterectomy    . Cervical cryotherapy      cervix  . 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 Lasik Bilateral   . Eye surgery    . Total hip arthroplasty Left 11/28/2012    Procedure: TOTAL HIP ARTHROPLASTY;  Surgeon: Kerin Salen, MD;  Location: [facility];  Service: Orthopedics;  Laterality: Left;   Family History  Problem Relation Age of Onset  . Hypertension Mother   . Heart disease Father   . Multiple sclerosis Father   . Heart attack Father     History  Sexual Activity  . Sexual Activity: Yes    Outpatient Encounter Prescriptions as of 11/19/2014  Medication Sig  . aspirin EC 81 MG tablet Take 1 tablet (81 mg total) by mouth daily.  . Cholecalciferol (VITAMIN D) 1000 UNITS capsule Take 1 capsule (1,000 Units total) by mouth daily.  . ciclopirox (PENLAC) 8 % solution Apply topically at bedtime. Apply over nail and surrounding skin. Apply daily over previous coat. After seven (7) days, may remove with alcohol and continue cycle.  . citalopram (CELEXA) 40 MG tablet Take 1 tablet (40 mg total) by mouth daily.  . clotrimazole-betamethasone (LOTRISONE) cream Apply 1 application topically 2 (two) times daily.  . Cyanocobalamin (VITAMIN B-12) 1000 MCG SUBL Place 5,000 tablets under the tongue daily.   . diphenhydrAMINE (BENADRYL) 25 MG tablet Take 25 mg by mouth at bedtime.  . fluticasone (FLONASE) 50 MCG/ACT nasal spray Place 1 spray into both nostrils daily as needed for allergies or rhinitis.  . furosemide (LASIX) 40 MG tablet Take 1 tablet (40 mg total) by mouth daily.  [name] ibuprofen (ADVIL,MOTRIN) 600 MG tablet Take 1 tablet (600 mg total) by mouth 2 (two) times daily as needed.  . loratadine (CLARITIN) 10 MG tablet Take 10 mg by mouth daily.  . metFORMIN (GLUCOPHAGE) 500 MG tablet Take 1 tablet (500 mg total) by mouth 2 (two) times daily with a meal.  . Omega-3 Fatty Acids (FISH OIL) 1200 MG CAPS Take 2,400 mg by mouth 2 (two) times daily.  [name] triamcinolone cream (KENALOG) 0.5 % Apply 1 application topically 3 (three) times daily as needed (to rash).  . [DISCONTINUED] citalopram (CELEXA) 40 MG tablet Take 1 tablet (40 mg total) by mouth daily.  . [DISCONTINUED] furosemide (LASIX) 40 MG tablet TAKE ONE TABLET BY MOUTH ONCE DAILY  . [DISCONTINUED] metFORMIN (GLUCOPHAGE) 500 MG tablet Take 1 tablet (500 mg total) by mouth 2 (two) times daily with a meal.  . doxycycline (VIBRA-TABS) 100 MG tablet Take 1 tablet (100 mg total) by mouth 2  (two) times daily. (Patient not taking: Reported on 11/19/2014)  . promethazine-codeine (PHENERGAN WITH CODEINE) 6.25-10 MG/5ML syrup Take 5 mLs by mouth every 4 (four) hours as needed. (Patient not taking: Reported on 11/19/2014)  . Umeclidinium-Vilanterol (ANORO ELLIPTA) 62.5-25 MCG/INH AEPB Inhale 1 Act into the lungs daily. (Patient not taking: Reported on 11/19/2014)   No facility-administered encounter medications on file as of 11/19/2014.    Activities of Daily Living In your present state of health, do you have any difficulty performing the following activities: 11/19/2014  Hearing? N  Vision? N  Difficulty concentrating or making decisions? N  Walking or climbing stairs? N  Dressing or bathing? N  Doing errands, shopping? N  Conservation officer, nature and  eating ? N  Using the Toilet? N  In the past six months, have you accidently leaked urine? N  Do you have problems with loss of bowel control? N  Managing your Medications? N  Managing your Finances? N  Housekeeping or managing your Housekeeping? N    Patient Care Team: Cassandria Anger, MD as PCP - General    Assessment:    Assessment   Patient presents for yearly preventative medicine examination. Medicare questionnaire screening were completed, i.e. Functional; fall risk; depression, memory loss and hearing. Unremarkable; spent time discussing memory issues and AD8 Score is 0 ) educated regarding memory loss and general patterns;    All immunizations and health maintenance protocols were reviewed with the patient and needed orders were placed./ would like to take shingles and to discuss with Dr. Alain Marion  Education provided for laboratory screens;    Medication reconciliation, past medical history, social history, problem list and allergies were reviewed in detail with the patient  Goals were established with regard to weight loss, exercise, and diet in compliance with medications based on the patient individualized risk; /  discussing formal exercise plan  End of life planning was discussed and completed   Exercise Activities and Dietary recommendations Current Exercise Habits:: Home exercise routine;Structured exercise class, Type of exercise: walking, Time (Minutes): 30, Frequency (Times/Week): 3, Weekly Exercise (Minutes/Week): 90, Intensity: Mild  Goals    . Weight < 150 lb (68.04 kg)     May try diet plan / jenny craig or other or nutri systems;      Fall Risk Fall Risk  11/19/2014 03/28/2014  Falls in the past year? No No   Depression Screen PHQ 2/9 Scores 11/19/2014 03/28/2014  PHQ - 2 Score 0 0      Cognitive Testing MMSE - Mini Mental State Exam 11/19/2014  Not completed: (No Data)   Discussed memory issues at length; Reviewed the Ad8 and score was 0;  Stays very busy and multi focused but no failures at critical task   Immunization History  Administered Date(s) Administered  . Influenza Split 11/03/2010, 12/30/2011  . Influenza Whole 10/16/2008, 10/08/2009  . Influenza,inj,Quad PF,36+ Mos 10/13/2012, 08/30/2013, 10/15/2014  . Pneumococcal Conjugate-13 08/30/2013  . Pneumococcal Polysaccharide-23 11/03/2010  . Td 02/25/2011   Screening Tests Health Maintenance  Topic Date Due  . Hepatitis C Screening  1945/10/21  . URINE MICROALBUMIN  06/30/1955  . COLONOSCOPY  06/30/1995  . ZOSTAVAX  06/29/2005  . DEXA SCAN  06/30/2010  . FOOT EXAM  07/27/2013  . OPHTHALMOLOGY EXAM  11/15/2014  . HEMOGLOBIN A1C  03/26/2015  . INFLUENZA VACCINE  08/13/2015  . MAMMOGRAM  10/01/2016  . TETANUS/TDAP  02/24/2021  . PNA vac Low Risk Adult  Completed      Plan:   During the course of the visit the patient was educated and counseled about the following appropriate screening and preventive services:   Vaccines to include Pneumoccal, Influenza, Hepatitis B, Td, Zostavax, HCV  Electrocardiogram 11/21/2012  Cardiovascular Disease/ BP good control; lipids in good control  Colorectal cancer  screening - to discuss colonoscopy referral vs colo-guard  Bone density screening/ deferred  Diabetes screening/ discussed; A1c 5.6  Glaucoma screening/ Nov 2015;   Mammography/PAP 09/2014  Nutrition counseling /Would like to lose weight; thinking about Rickard Patience;   Patient Instructions (the written plan) was given to the patient.   Wynetta Fines, RN  11/19/2014

## 2014-11-19 NOTE — Assessment & Plan Note (Signed)
On B12 

## 2014-11-19 NOTE — Assessment & Plan Note (Signed)
Doing well 

## 2014-11-19 NOTE — Progress Notes (Signed)
Pre visit review using our clinic review tool, if applicable. No additional management support is needed unless otherwise documented below in the visit note. 

## 2014-11-19 NOTE — Assessment & Plan Note (Signed)
On Metformin 

## 2014-11-19 NOTE — Patient Instructions (Addendum)
Tiffany Gibbs , Thank you for taking time to come for your Medicare Wellness Visit. I appreciate your ongoing commitment to your health goals. Please review the following plan we discussed and let me know if I can assist you in the future.   These are the goals we discussed: Goals    . Weight < 150 lb (68.04 kg)     May try diet plan / jenny craig or other or nutri systems;       This is a list of the screening recommended for you and due dates:  Health Maintenance  Topic Date Due  .  Hepatitis C: One time screening is recommended by Center for Disease Control  (CDC) for  adults born from [birth year] through 1965.   1945-11-10  . Urine Protein Check  06/30/1955  . Colon Cancer Screening  06/30/1995  . Shingles Vaccine  06/29/2005  . DEXA scan (bone density measurement)  06/30/2010  . Complete foot exam   07/27/2013  . Eye exam for diabetics  11/15/2014  . Hemoglobin A1C  03/26/2015  . Flu Shot  08/13/2015  . Mammogram  10/01/2016  . Tetanus Vaccine  02/24/2021  . Pneumonia vaccines  Completed        Preventive Care for Adults, Female A healthy lifestyle and preventive care can promote health and wellness. Preventive health guidelines for women include the following key practices.  A routine yearly physical is a good way to check with your health care provider about your health and preventive screening. It is a chance to share any concerns and updates on your health and to receive a thorough exam.  Visit your dentist for a routine exam and preventive care every 6 months. Brush your teeth twice a day and floss once a day. Good oral hygiene prevents tooth decay and gum disease.  The frequency of eye exams is based on your age, health, family medical history, use of contact lenses, and other factors. Follow your health care provider's recommendations for frequency of eye exams.  Eat a healthy diet. Foods like vegetables, fruits, whole grains, low-fat dairy products, and lean protein foods  contain the nutrients you need without too many calories. Decrease your intake of foods high in solid fats, added sugars, and salt. Eat the right amount of calories for you.Get information about a proper diet from your health care provider, if necessary.  Regular physical exercise is one of the most important things you can do for your health. Most adults should get at least 150 minutes of moderate-intensity exercise (any activity that increases your heart rate and causes you to sweat) each week. In addition, most adults need muscle-strengthening exercises on 2 or more days a week.  Maintain a healthy weight. The body mass index (BMI) is a screening tool to identify possible weight problems. It provides an estimate of body fat based on height and weight. Your health care provider can find your BMI and can help you achieve or maintain a healthy weight.For adults 20 years and older:  A BMI below 18.5 is considered underweight.  A BMI of 18.5 to 24.9 is normal.  A BMI of 25 to 29.9 is considered overweight.  A BMI of 30 and above is considered obese.  Maintain normal blood lipids and cholesterol levels by exercising and minimizing your intake of saturated fat. Eat a balanced diet with plenty of fruit and vegetables. Blood tests for lipids and cholesterol should begin at age 50 and be repeated every 5 years. If  your lipid or cholesterol levels are high, you are over 50, or you are at high risk for heart disease, you may need your cholesterol levels checked more frequently.Ongoing high lipid and cholesterol levels should be treated with medicines if diet and exercise are not working.  If you smoke, find out from your health care provider how to quit. If you do not use tobacco, do not start.  Lung cancer screening is recommended for adults aged 78-80 years who are at high risk for developing lung cancer because of a history of smoking. A yearly low-dose CT scan of the lungs is recommended for people  who have at least a 30-pack-year history of smoking and are a current smoker or have quit within the past 15 years. A pack year of smoking is smoking an average of 1 pack of cigarettes a day for 1 year (for example: 1 pack a day for 30 years or 2 packs a day for 15 years). Yearly screening should continue until the smoker has stopped smoking for at least 15 years. Yearly screening should be stopped for people who develop a health problem that would prevent them from having lung cancer treatment.  If you are pregnant, do not drink alcohol. If you are breastfeeding, be very cautious about drinking alcohol. If you are not pregnant and choose to drink alcohol, do not have more than 1 drink per day. One drink is considered to be 12 ounces (355 mL) of beer, 5 ounces (148 mL) of wine, or 1.5 ounces (44 mL) of liquor.  Avoid use of street drugs. Do not share needles with anyone. Ask for help if you need support or instructions about stopping the use of drugs.  High blood pressure causes heart disease and increases the risk of stroke. Your blood pressure should be checked at least every 1 to 2 years. Ongoing high blood pressure should be treated with medicines if weight loss and exercise do not work.  If you are 64-31 years old, ask your health care provider if you should take aspirin to prevent strokes.  Diabetes screening is done by taking a blood sample to check your blood glucose level after you have not eaten for a certain period of time (fasting). If you are not overweight and you do not have risk factors for diabetes, you should be screened once every 3 years starting at age 23. If you are overweight or obese and you are 79-8 years of age, you should be screened for diabetes every year as part of your cardiovascular risk assessment.  Breast cancer screening is essential preventive care for women. You should practice "breast self-awareness." This means understanding the normal appearance and feel of your  breasts and may include breast self-examination. Any changes detected, no matter how small, should be reported to a health care provider. Women in their 70s and 30s should have a clinical breast exam (CBE) by a health care provider as part of a regular health exam every 1 to 3 years. After age 52, women should have a CBE every year. Starting at age 74, women should consider having a mammogram (breast X-ray test) every year. Women who have a family history of breast cancer should talk to their health care provider about genetic screening. Women at a high risk of breast cancer should talk to their health care providers about having an MRI and a mammogram every year.  Breast cancer gene (BRCA)-related cancer risk assessment is recommended for women who have family members with BRCA-related  cancers. BRCA-related cancers include breast, ovarian, tubal, and peritoneal cancers. Having family members with these cancers may be associated with an increased risk for harmful changes (mutations) in the breast cancer genes BRCA1 and BRCA2. Results of the assessment will determine the need for genetic counseling and BRCA1 and BRCA2 testing.  Your health care provider may recommend that you be screened regularly for cancer of the pelvic organs (ovaries, uterus, and vagina). This screening involves a pelvic examination, including checking for microscopic changes to the surface of your cervix (Pap test). You may be encouraged to have this screening done every 3 years, beginning at age 9.  For women ages 95-65, health care providers may recommend pelvic exams and Pap testing every 3 years, or they may recommend the Pap and pelvic exam, combined with testing for human papilloma virus (HPV), every 5 years. Some types of HPV increase your risk of cervical cancer. Testing for HPV may also be done on women of any age with unclear Pap test results.  Other health care providers may not recommend any screening for nonpregnant women  who are considered low risk for pelvic cancer and who do not have symptoms. Ask your health care provider if a screening pelvic exam is right for you.  If you have had past treatment for cervical cancer or a condition that could lead to cancer, you need Pap tests and screening for cancer for at least 20 years after your treatment. If Pap tests have been discontinued, your risk factors (such as having a new sexual partner) need to be reassessed to determine if screening should resume. Some women have medical problems that increase the chance of getting cervical cancer. In these cases, your health care provider may recommend more frequent screening and Pap tests.  Colorectal cancer can be detected and often prevented. Most routine colorectal cancer screening begins at the age of 74 years and continues through age 18 years. However, your health care provider may recommend screening at an earlier age if you have risk factors for colon cancer. On a yearly basis, your health care provider may provide home test kits to check for hidden blood in the stool. Use of a small camera at the end of a tube, to directly examine the colon (sigmoidoscopy or colonoscopy), can detect the earliest forms of colorectal cancer. Talk to your health care provider about this at age 11, when routine screening begins. Direct exam of the colon should be repeated every 5-10 years through age 28 years, unless early forms of precancerous polyps or small growths are found.  People who are at an increased risk for hepatitis B should be screened for this virus. You are considered at high risk for hepatitis B if:  You were born in a country where hepatitis B occurs often. Talk with your health care provider about which countries are considered high risk.  Your parents were born in a high-risk country and you have not received a shot to protect against hepatitis B (hepatitis B vaccine).  You have HIV or AIDS.  You use needles to inject  street drugs.  You live with, or have sex with, someone who has hepatitis B.  You get hemodialysis treatment.  You take certain medicines for conditions like cancer, organ transplantation, and autoimmune conditions.  Hepatitis C blood testing is recommended for all people born from [birth year] through 1965 and any individual with known risks for hepatitis C.  Practice safe sex. Use condoms and avoid high-risk sexual practices to reduce the  spread of sexually transmitted infections (STIs). STIs include gonorrhea, chlamydia, syphilis, trichomonas, herpes, HPV, and human immunodeficiency virus (HIV). Herpes, HIV, and HPV are viral illnesses that have no cure. They can result in disability, cancer, and death.  You should be screened for sexually transmitted illnesses (STIs) including gonorrhea and chlamydia if:  You are sexually active and are younger than 24 years.  You are older than 24 years and your health care provider tells you that you are at risk for this type of infection.  Your sexual activity has changed since you were last screened and you are at an increased risk for chlamydia or gonorrhea. Ask your health care provider if you are at risk.  If you are at risk of being infected with HIV, it is recommended that you take a prescription medicine daily to prevent HIV infection. This is called preexposure prophylaxis (PrEP). You are considered at risk if:  You are sexually active and do not regularly use condoms or know the HIV status of your partner(s).  You take drugs by injection.  You are sexually active with a partner who has HIV.  Talk with your health care provider about whether you are at high risk of being infected with HIV. If you choose to begin PrEP, you should first be tested for HIV. You should then be tested every 3 months for as long as you are taking PrEP.  Osteoporosis is a disease in which the bones lose minerals and strength with aging. This can result in serious bone  fractures or breaks. The risk of osteoporosis can be identified using a bone density scan. Women ages 34 years and over and women at risk for fractures or osteoporosis should discuss screening with their health care providers. Ask your health care provider whether you should take a calcium supplement or vitamin D to reduce the rate of osteoporosis.  Menopause can be associated with physical symptoms and risks. Hormone replacement therapy is available to decrease symptoms and risks. You should talk to your health care provider about whether hormone replacement therapy is right for you.  Use sunscreen. Apply sunscreen liberally and repeatedly throughout the day. You should seek shade when your shadow is shorter than you. Protect yourself by wearing long sleeves, pants, a wide-brimmed hat, and sunglasses year round, whenever you are outdoors.  Once a month, do a whole body skin exam, using a mirror to look at the skin on your back. Tell your health care provider of new moles, moles that have irregular borders, moles that are larger than a pencil eraser, or moles that have changed in shape or color.  Stay current with required vaccines (immunizations).  Influenza vaccine. All adults should be immunized every year.  Tetanus, diphtheria, and acellular pertussis (Td, Tdap) vaccine. Pregnant women should receive 1 dose of Tdap vaccine during each pregnancy. The dose should be obtained regardless of the length of time since the last dose. Immunization is preferred during the 27th-36th week of gestation. An adult who has not previously received Tdap or who does not know her vaccine status should receive 1 dose of Tdap. This initial dose should be followed by tetanus and diphtheria toxoids (Td) booster doses every 10 years. Adults with an unknown or incomplete history of completing a 3-dose immunization series with Td-containing vaccines should begin or complete a primary immunization series including a Tdap dose.  Adults should receive a Td booster every 10 years.  Varicella vaccine. An adult without evidence of immunity to varicella should  receive 2 doses or a second dose if she has previously received 1 dose. Pregnant females who do not have evidence of immunity should receive the first dose after pregnancy. This first dose should be obtained before leaving the health care facility. The second dose should be obtained 4-8 weeks after the first dose.  Human papillomavirus (HPV) vaccine. Females aged 13-26 years who have not received the vaccine previously should obtain the 3-dose series. The vaccine is not recommended for use in pregnant females. However, pregnancy testing is not needed before receiving a dose. If a female is found to be pregnant after receiving a dose, no treatment is needed. In that case, the remaining doses should be delayed until after the pregnancy. Immunization is recommended for any person with an immunocompromised condition through the age of 22 years if she did not get any or all doses earlier. During the 3-dose series, the second dose should be obtained 4-8 weeks after the first dose. The third dose should be obtained 24 weeks after the first dose and 16 weeks after the second dose.  Zoster vaccine. One dose is recommended for adults aged 64 years or older unless certain conditions are present.  Measles, mumps, and rubella (MMR) vaccine. Adults born before 75 generally are considered immune to measles and mumps. Adults born in [birth year] or later should have 1 or more doses of MMR vaccine unless there is a contraindication to the vaccine or there is laboratory evidence of immunity to each of the three diseases. A routine second dose of MMR vaccine should be obtained at least 28 days after the first dose for students attending postsecondary schools, health care workers, or international travelers. People who received inactivated measles vaccine or an unknown type of measles vaccine during  1963-1967 should receive 2 doses of MMR vaccine. People who received inactivated mumps vaccine or an unknown type of mumps vaccine before 1979 and are at high risk for mumps infection should consider immunization with 2 doses of MMR vaccine. For females of childbearing age, rubella immunity should be determined. If there is no evidence of immunity, females who are not pregnant should be vaccinated. If there is no evidence of immunity, females who are pregnant should delay immunization until after pregnancy. Unvaccinated health care workers born before [birth year] who lack laboratory evidence of measles, mumps, or rubella immunity or laboratory confirmation of disease should consider measles and mumps immunization with 2 doses of MMR vaccine or rubella immunization with 1 dose of MMR vaccine.  Pneumococcal 13-valent conjugate (PCV13) vaccine. When indicated, a person who is uncertain of his immunization history and has no record of immunization should receive the PCV13 vaccine. All adults 51 years of age and older should receive this vaccine. An adult aged 67 years or older who has certain medical conditions and has not been previously immunized should receive 1 dose of PCV13 vaccine. This PCV13 should be followed with a dose of pneumococcal polysaccharide (PPSV23) vaccine. Adults who are at high risk for pneumococcal disease should obtain the PPSV23 vaccine at least 8 weeks after the dose of PCV13 vaccine. Adults older than 69 years of age who have normal immune system function should obtain the PPSV23 vaccine dose at least 1 year after the dose of PCV13 vaccine.  Pneumococcal polysaccharide (PPSV23) vaccine. When PCV13 is also indicated, PCV13 should be obtained first. All adults aged 55 years and older should be immunized. An adult younger than age 14 years who has certain medical conditions should be immunized. Any  person who resides in a nursing home or long-term care facility should be immunized. An adult smoker  should be immunized. People with an immunocompromised condition and certain other conditions should receive both PCV13 and PPSV23 vaccines. People with human immunodeficiency virus (HIV) infection should be immunized as soon as possible after diagnosis. Immunization during chemotherapy or radiation therapy should be avoided. Routine use of PPSV23 vaccine is not recommended for American Indians, [place] Natives, or people younger than 65 years unless there are medical conditions that require PPSV23 vaccine. When indicated, people who have unknown immunization and have no record of immunization should receive PPSV23 vaccine. One-time revaccination 5 years after the first dose of PPSV23 is recommended for people aged 19-64 years who have chronic kidney failure, nephrotic syndrome, asplenia, or immunocompromised conditions. People who received 1-2 doses of PPSV23 before age 33 years should receive another dose of PPSV23 vaccine at age 1 years or later if at least 5 years have passed since the previous dose. Doses of PPSV23 are not needed for people immunized with PPSV23 at or after age 82 years.  Meningococcal vaccine. Adults with asplenia or persistent complement component deficiencies should receive 2 doses of quadrivalent meningococcal conjugate (MenACWY-D) vaccine. The doses should be obtained at least 2 months apart. Microbiologists working with certain meningococcal bacteria, [employer] recruits, people at risk during an outbreak, and people who travel to or live in countries with a high rate of meningitis should be immunized. A first-year college student up through age 71 years who is living in a residence hall should receive a dose if she did not receive a dose on or after her 16th birthday. Adults who have certain high-risk conditions should receive one or more doses of vaccine.  Hepatitis A vaccine. Adults who wish to be protected from this disease, have certain high-risk conditions, work with hepatitis  A-infected animals, work in hepatitis A research labs, or travel to or work in countries with a high rate of hepatitis A should be immunized. Adults who were previously unvaccinated and who anticipate close contact with an international adoptee during the first 60 days after arrival in the [place] from a country with a high rate of hepatitis A should be immunized.  Hepatitis B vaccine. Adults who wish to be protected from this disease, have certain high-risk conditions, may be exposed to blood or other infectious body fluids, are household contacts or sex partners of hepatitis B positive people, are clients or workers in certain care facilities, or travel to or work in countries with a high rate of hepatitis B should be immunized.  Haemophilus influenzae type b (Hib) vaccine. A previously unvaccinated person with asplenia or sickle cell disease or having a scheduled splenectomy should receive 1 dose of Hib vaccine. Regardless of previous immunization, a recipient of a hematopoietic stem cell transplant should receive a 3-dose series 6-12 months after her successful transplant. Hib vaccine is not recommended for adults with HIV infection. Preventive Services / Frequency Ages 4 to 12 years  Blood pressure check.** / Every 3-5 years.  Lipid and cholesterol check.** / Every 5 years beginning at age 35.  Clinical breast exam.** / Every 3 years for women in their 30s and 58s.  BRCA-related cancer risk assessment.** / For women who have family members with a BRCA-related cancer (breast, ovarian, tubal, or peritoneal cancers).  Pap test.** / Every 2 years from ages 19 through 67. Every 3 years starting at age 53 through age 50 or 26 with a history  of 3 consecutive normal Pap tests.  HPV screening.** / Every 3 years from ages 37 through ages 64 to 31 with a history of 3 consecutive normal Pap tests.  Hepatitis C blood test.** / For any individual with known risks for hepatitis C.  Skin self-exam.  / Monthly.  Influenza vaccine. / Every year.  Tetanus, diphtheria, and acellular pertussis (Tdap, Td) vaccine.** / Consult your health care provider. Pregnant women should receive 1 dose of Tdap vaccine during each pregnancy. 1 dose of Td every 10 years.  Varicella vaccine.** / Consult your health care provider. Pregnant females who do not have evidence of immunity should receive the first dose after pregnancy.  HPV vaccine. / 3 doses over 6 months, if 89 and younger. The vaccine is not recommended for use in pregnant females. However, pregnancy testing is not needed before receiving a dose.  Measles, mumps, rubella (MMR) vaccine.** / You need at least 1 dose of MMR if you were born in 1957 or later. You may also need a 2nd dose. For females of childbearing age, rubella immunity should be determined. If there is no evidence of immunity, females who are not pregnant should be vaccinated. If there is no evidence of immunity, females who are pregnant should delay immunization until after pregnancy.  Pneumococcal 13-valent conjugate (PCV13) vaccine.** / Consult your health care provider.  Pneumococcal polysaccharide (PPSV23) vaccine.** / 1 to 2 doses if you smoke cigarettes or if you have certain conditions.  Meningococcal vaccine.** / 1 dose if you are age 61 to 48 years and a Market researcher living in a residence hall, or have one of several medical conditions, you need to get vaccinated against meningococcal disease. You may also need additional booster doses.  Hepatitis A vaccine.** / Consult your health care provider.  Hepatitis B vaccine.** / Consult your health care provider.  Haemophilus influenzae type b (Hib) vaccine.** / Consult your health care provider. Ages 23 to 36 years  Blood pressure check.** / Every year.  Lipid and cholesterol check.** / Every 5 years beginning at age 55 years.  Lung cancer screening. / Every year if you are aged 81-80 years and have a  30-pack-year history of smoking and currently smoke or have quit within the past 15 years. Yearly screening is stopped once you have quit smoking for at least 15 years or develop a health problem that would prevent you from having lung cancer treatment.  Clinical breast exam.** / Every year after age 41 years.  BRCA-related cancer risk assessment.** / For women who have family members with a BRCA-related cancer (breast, ovarian, tubal, or peritoneal cancers).  Mammogram.** / Every year beginning at age 53 years and continuing for as long as you are in good health. Consult with your health care provider.  Pap test.** / Every 3 years starting at age 67 years through age 13 or 7 years with a history of 3 consecutive normal Pap tests.  HPV screening.** / Every 3 years from ages 60 years through ages 49 to 14 years with a history of 3 consecutive normal Pap tests.  Fecal occult blood test (FOBT) of stool. / Every year beginning at age 65 years and continuing until age 39 years. You may not need to do this test if you get a colonoscopy every 10 years.  Flexible sigmoidoscopy or colonoscopy.** / Every 5 years for a flexible sigmoidoscopy or every 10 years for a colonoscopy beginning at age 56 years and continuing until age 98 years.  Hepatitis C blood test.** / For all people born from [birth year] through 1965 and any individual with known risks for hepatitis C.  Skin self-exam. / Monthly.  Influenza vaccine. / Every year.  Tetanus, diphtheria, and acellular pertussis (Tdap/Td) vaccine.** / Consult your health care provider. Pregnant women should receive 1 dose of Tdap vaccine during each pregnancy. 1 dose of Td every 10 years.  Varicella vaccine.** / Consult your health care provider. Pregnant females who do not have evidence of immunity should receive the first dose after pregnancy.  Zoster vaccine.** / 1 dose for adults aged 81 years or older.  Measles, mumps, rubella (MMR) vaccine.** / You need  at least 1 dose of MMR if you were born in 1957 or later. You may also need a second dose. For females of childbearing age, rubella immunity should be determined. If there is no evidence of immunity, females who are not pregnant should be vaccinated. If there is no evidence of immunity, females who are pregnant should delay immunization until after pregnancy.  Pneumococcal 13-valent conjugate (PCV13) vaccine.** / Consult your health care provider.  Pneumococcal polysaccharide (PPSV23) vaccine.** / 1 to 2 doses if you smoke cigarettes or if you have certain conditions.  Meningococcal vaccine.** / Consult your health care provider.  Hepatitis A vaccine.** / Consult your health care provider.  Hepatitis B vaccine.** / Consult your health care provider.  Haemophilus influenzae type b (Hib) vaccine.** / Consult your health care provider. Ages 18 years and over  Blood pressure check.** / Every year.  Lipid and cholesterol check.** / Every 5 years beginning at age 53 years.  Lung cancer screening. / Every year if you are aged 27-80 years and have a 30-pack-year history of smoking and currently smoke or have quit within the past 15 years. Yearly screening is stopped once you have quit smoking for at least 15 years or develop a health problem that would prevent you from having lung cancer treatment.  Clinical breast exam.** / Every year after age 71 years.  BRCA-related cancer risk assessment.** / For women who have family members with a BRCA-related cancer (breast, ovarian, tubal, or peritoneal cancers).  Mammogram.** / Every year beginning at age 32 years and continuing for as long as you are in good health. Consult with your health care provider.  Pap test.** / Every 3 years starting at age 76 years through age 36 or 1 years with 3 consecutive normal Pap tests. Testing can be stopped between 65 and 70 years with 3 consecutive normal Pap tests and no abnormal Pap or HPV tests in the past 10  years.  HPV screening.** / Every 3 years from ages 71 years through ages 61 or 25 years with a history of 3 consecutive normal Pap tests. Testing can be stopped between 65 and 70 years with 3 consecutive normal Pap tests and no abnormal Pap or HPV tests in the past 10 years.  Fecal occult blood test (FOBT) of stool. / Every year beginning at age 70 years and continuing until age 69 years. You may not need to do this test if you get a colonoscopy every 10 years.  Flexible sigmoidoscopy or colonoscopy.** / Every 5 years for a flexible sigmoidoscopy or every 10 years for a colonoscopy beginning at age 67 years and continuing until age 7 years.  Hepatitis C blood test.** / For all people born from [birth year] through 1965 and any individual with known risks for hepatitis C.  Osteoporosis screening.** / A one-time screening  for women ages 70 years and over and women at risk for fractures or osteoporosis.  Skin self-exam. / Monthly.  Influenza vaccine. / Every year.  Tetanus, diphtheria, and acellular pertussis (Tdap/Td) vaccine.** / 1 dose of Td every 10 years.  Varicella vaccine.** / Consult your health care provider.  Zoster vaccine.** / 1 dose for adults aged 16 years or older.  Pneumococcal 13-valent conjugate (PCV13) vaccine.** / Consult your health care provider.  Pneumococcal polysaccharide (PPSV23) vaccine.** / 1 dose for all adults aged 80 years and older.  Meningococcal vaccine.** / Consult your health care provider.  Hepatitis A vaccine.** / Consult your health care provider.  Hepatitis B vaccine.** / Consult your health care provider.  Haemophilus influenzae type b (Hib) vaccine.** / Consult your health care provider. ** Family history and personal history of risk and conditions may change your health care provider's recommendations.   This information is not intended to replace advice given to you by your health care provider. Make sure you discuss any questions you have with  your health care provider.   Document Released: 02/24/2001 Document Revised: 01/19/2014 Document Reviewed: 05/26/2010 Elsevier Interactive Patient Education [employer].

## 2014-11-19 NOTE — Assessment & Plan Note (Signed)
Chronic Furosemide prn

## 2014-11-19 NOTE — Assessment & Plan Note (Signed)
2012 ? Etiology - likely due to fast processing

## 2014-11-19 NOTE — Assessment & Plan Note (Signed)
Resolved on [facility]

## 2014-11-19 NOTE — Progress Notes (Signed)
Subjective:  Patient ID: Tiffany Gibbs, female    DOB: 1945/08/02  Age: 69 y.o. MRN: [id number]  CC: Medicare Wellness   HPI [name] presents for a well exam. F/u HTN, asthma, B12 def. C/o memory issues  Outpatient Prescriptions Prior to Visit  Medication Sig Dispense Refill  . aspirin EC 81 MG tablet Take 1 tablet (81 mg total) by mouth daily. 100 tablet 3  . Cholecalciferol (VITAMIN D) 1000 UNITS capsule Take 1 capsule (1,000 Units total) by mouth daily. 100 capsule 3  . ciclopirox (PENLAC) 8 % solution Apply topically at bedtime. Apply over nail and surrounding skin. Apply daily over previous coat. After seven (7) days, may remove with alcohol and continue cycle. 6.6 mL 3  . clotrimazole-betamethasone (LOTRISONE) cream Apply 1 application topically 2 (two) times daily. 45 g 1  . Cyanocobalamin (VITAMIN B-12) 1000 MCG SUBL Place 5,000 tablets under the tongue daily.     . diphenhydrAMINE (BENADRYL) 25 MG tablet Take 25 mg by mouth at bedtime.    . fluticasone (FLONASE) 50 MCG/ACT nasal spray Place 1 spray into both nostrils daily as needed for allergies or rhinitis.    [name] ibuprofen (ADVIL,MOTRIN) 600 MG tablet Take 1 tablet (600 mg total) by mouth 2 (two) times daily as needed. 60 tablet 3  . loratadine (CLARITIN) 10 MG tablet Take 10 mg by mouth daily.    . Omega-3 Fatty Acids (FISH OIL) 1200 MG CAPS Take 2,400 mg by mouth 2 (two) times daily.    [name] triamcinolone cream (KENALOG) 0.5 % Apply 1 application topically 3 (three) times daily as needed (to rash).    . citalopram (CELEXA) 40 MG tablet Take 1 tablet (40 mg total) by mouth daily. 90 tablet 3  . furosemide (LASIX) 40 MG tablet TAKE ONE TABLET BY MOUTH ONCE DAILY 90 tablet 3  . metFORMIN (GLUCOPHAGE) 500 MG tablet Take 1 tablet (500 mg total) by mouth 2 (two) times daily with a meal. 180 tablet 2  . doxycycline (VIBRA-TABS) 100 MG tablet Take 1 tablet (100 mg total) by mouth 2 (two) times daily. (Patient not taking: Reported  on 11/19/2014) 20 tablet 0  . promethazine-codeine (PHENERGAN WITH CODEINE) 6.25-10 MG/5ML syrup Take 5 mLs by mouth every 4 (four) hours as needed. (Patient not taking: Reported on 11/19/2014) 300 mL 0  . Umeclidinium-Vilanterol (ANORO ELLIPTA) 62.5-25 MCG/INH AEPB Inhale 1 Act into the lungs daily. (Patient not taking: Reported on 11/19/2014) 1 each 3   No facility-administered medications prior to visit.    ROS Review of Systems  Constitutional: Negative for chills, activity change, appetite change, fatigue and unexpected weight change.  HENT: Negative for congestion, mouth sores and sinus pressure.   Eyes: Negative for visual disturbance.  Respiratory: Negative for cough and chest tightness.   Cardiovascular: Negative for leg swelling.  Gastrointestinal: Negative for nausea and abdominal pain.  Genitourinary: Negative for frequency, difficulty urinating and vaginal pain.  Musculoskeletal: Negative for back pain, gait problem and neck stiffness.  Skin: Negative for pallor and rash.  Neurological: Negative for dizziness, tremors, weakness, numbness and headaches.  Psychiatric/Behavioral: Negative for suicidal ideas, confusion and sleep disturbance. The patient is nervous/anxious.     Objective:  BP 124/70 mmHg  Pulse 77  Temp(Src) 97.9 F (36.6 C) (Oral)  Ht 5' 3.5" (1.613 m)  Wt 182 lb 4 oz (82.668 kg)  BMI 31.77 kg/m2  SpO2 97%  BP Readings from Last 3 Encounters:  11/19/14 124/70  10/15/14 118/80  09/26/14 110/68    Wt Readings from Last 3 Encounters:  11/19/14 182 lb 4 oz (82.668 kg)  10/15/14 182 lb (82.555 kg)  09/26/14 184 lb (83.462 kg)    Physical Exam  Constitutional: She appears well-developed. No distress.  HENT:  Head: Normocephalic.  Right Ear: External ear normal.  Left Ear: External ear normal.  Nose: Nose normal.  Mouth/Throat: Oropharynx is clear and moist.  Eyes: Conjunctivae are normal. Pupils are equal, round, and reactive to light. Right eye  exhibits no discharge. Left eye exhibits no discharge.  Neck: Normal range of motion. Neck supple. No JVD present. No tracheal deviation present. No thyromegaly present.  Cardiovascular: Normal rate, regular rhythm and normal heart sounds.   Pulmonary/Chest: No stridor. No respiratory distress. She has no wheezes.  Abdominal: Soft. Bowel sounds are normal. She exhibits no distension and no mass. There is no tenderness. There is no rebound and no guarding.  Musculoskeletal: She exhibits no edema or tenderness.  Lymphadenopathy:    She has no cervical adenopathy.  Neurological: She displays normal reflexes. No cranial nerve deficit. She exhibits normal muscle tone. Coordination normal.  Skin: No rash noted. No erythema.  Psychiatric: She has a normal mood and affect. Her behavior is normal. Judgment and thought content normal.  talkative   Lab Results  Component Value Date   WBC 9.4 11/19/2014   HGB 16.0* 11/19/2014   HCT 47.2* 11/19/2014   PLT 280.0 11/19/2014   GLUCOSE 96 11/19/2014   CHOL 291* 11/19/2014   TRIG 120.0 11/19/2014   HDL 75.80 11/19/2014   LDLDIRECT 148.2 02/03/2013   LDLCALC 191* 11/19/2014   ALT 17 11/19/2014   AST 18 11/19/2014   NA 139 11/19/2014   K 3.6 11/19/2014   CL 99 11/19/2014   CREATININE 1.07 11/19/2014   BUN 17 11/19/2014   CO2 29 11/19/2014   TSH 2.01 11/19/2014   INR 1.14 11/21/2012   HGBA1C 5.7 11/19/2014    Dg Chest 2 View  09/26/2014  CLINICAL DATA:  Cough and shortness of breath for 1 month. EXAM: CHEST  2 VIEW COMPARISON:  07/27/2012 FINDINGS: The heart size and mediastinal contours are within normal limits. No evidence of pulmonary infiltrate or edema. No evidence of pleural effusion. The visualized skeletal structures are unremarkable. IMPRESSION: Stable exam.  No active cardiopulmonary disease. Electronically Signed   By: Earle Gell M.D.   On: 09/26/2014 19:19    Assessment & Plan:   Marielle was seen today for medicare  wellness.  Diagnoses and all orders for this visit:  Well adult exam -     furosemide (LASIX) 40 MG tablet; Take 1 tablet (40 mg total) by mouth daily. -     metFORMIN (GLUCOPHAGE) 500 MG tablet; Take 1 tablet (500 mg total) by mouth 2 (two) times daily with a meal. -     citalopram (CELEXA) 40 MG tablet; Take 1 tablet (40 mg total) by mouth daily. -     Vitamin B12; Future -     Hemoglobin A1c; Future -     Basic metabolic panel; Future -     CBC with Differential/Platelet; Future -     Hepatic function panel; Future -     Lipid panel; Future -     TSH; Future -     Urinalysis; Future  Hypertension, essential -     furosemide (LASIX) 40 MG tablet; Take 1 tablet (40 mg total) by mouth daily. -     metFORMIN (GLUCOPHAGE)  500 MG tablet; Take 1 tablet (500 mg total) by mouth 2 (two) times daily with a meal. -     citalopram (CELEXA) 40 MG tablet; Take 1 tablet (40 mg total) by mouth daily. -     Vitamin B12; Future -     Hemoglobin A1c; Future -     Basic metabolic panel; Future -     CBC with Differential/Platelet; Future -     Hepatic function panel; Future -     Lipid panel; Future -     TSH; Future -     Urinalysis; Future  Phlebitis of superficial vein of lower extremity, left -     furosemide (LASIX) 40 MG tablet; Take 1 tablet (40 mg total) by mouth daily. -     metFORMIN (GLUCOPHAGE) 500 MG tablet; Take 1 tablet (500 mg total) by mouth 2 (two) times daily with a meal. -     citalopram (CELEXA) 40 MG tablet; Take 1 tablet (40 mg total) by mouth daily. -     Vitamin B12; Future -     Hemoglobin A1c; Future -     Basic metabolic panel; Future -     CBC with Differential/Platelet; Future -     Hepatic function panel; Future -     Lipid panel; Future -     TSH; Future -     Urinalysis; Future  B12 deficiency -     furosemide (LASIX) 40 MG tablet; Take 1 tablet (40 mg total) by mouth daily. -     metFORMIN (GLUCOPHAGE) 500 MG tablet; Take 1 tablet (500 mg total) by mouth 2  (two) times daily with a meal. -     citalopram (CELEXA) 40 MG tablet; Take 1 tablet (40 mg total) by mouth daily. -     Vitamin B12; Future -     Hemoglobin A1c; Future -     Basic metabolic panel; Future -     CBC with Differential/Platelet; Future -     Hepatic function panel; Future -     Lipid panel; Future -     TSH; Future -     Urinalysis; Future  Type 2 diabetes mellitus without complication, without long-term current use of insulin (HCC) -     furosemide (LASIX) 40 MG tablet; Take 1 tablet (40 mg total) by mouth daily. -     metFORMIN (GLUCOPHAGE) 500 MG tablet; Take 1 tablet (500 mg total) by mouth 2 (two) times daily with a meal. -     citalopram (CELEXA) 40 MG tablet; Take 1 tablet (40 mg total) by mouth daily. -     Vitamin B12; Future -     Hemoglobin A1c; Future -     Basic metabolic panel; Future -     CBC with Differential/Platelet; Future -     Hepatic function panel; Future -     Lipid panel; Future -     TSH; Future -     Urinalysis; Future  Asthmatic bronchitis, mild intermittent, uncomplicated -     furosemide (LASIX) 40 MG tablet; Take 1 tablet (40 mg total) by mouth daily. -     metFORMIN (GLUCOPHAGE) 500 MG tablet; Take 1 tablet (500 mg total) by mouth 2 (two) times daily with a meal. -     citalopram (CELEXA) 40 MG tablet; Take 1 tablet (40 mg total) by mouth daily. -     Vitamin B12; Future -     Hemoglobin A1c; Future -  Basic metabolic panel; Future -     CBC with Differential/Platelet; Future -     Hepatic function panel; Future -     Lipid panel; Future -     TSH; Future -     Urinalysis; Future  Anxiety state -     furosemide (LASIX) 40 MG tablet; Take 1 tablet (40 mg total) by mouth daily. -     metFORMIN (GLUCOPHAGE) 500 MG tablet; Take 1 tablet (500 mg total) by mouth 2 (two) times daily with a meal. -     citalopram (CELEXA) 40 MG tablet; Take 1 tablet (40 mg total) by mouth daily. -     Vitamin B12; Future -     Hemoglobin A1c;  Future -     Basic metabolic panel; Future -     CBC with Differential/Platelet; Future -     Hepatic function panel; Future -     Lipid panel; Future -     TSH; Future -     Urinalysis; Future  Memory difficulty -     furosemide (LASIX) 40 MG tablet; Take 1 tablet (40 mg total) by mouth daily. -     metFORMIN (GLUCOPHAGE) 500 MG tablet; Take 1 tablet (500 mg total) by mouth 2 (two) times daily with a meal. -     citalopram (CELEXA) 40 MG tablet; Take 1 tablet (40 mg total) by mouth daily. -     Vitamin B12; Future -     Hemoglobin A1c; Future -     Basic metabolic panel; Future -     CBC with Differential/Platelet; Future -     Hepatic function panel; Future -     Lipid panel; Future -     TSH; Future -     Urinalysis; Future   I have changed Ms. Ebbert's furosemide. I am also having her maintain her Vitamin B-12, Vitamin D, Fish Oil, diphenhydrAMINE, loratadine, fluticasone, triamcinolone cream, ciclopirox, aspirin EC, clotrimazole-betamethasone, Umeclidinium-Vilanterol, promethazine-codeine, doxycycline, ibuprofen, metFORMIN, and citalopram.  Meds ordered this encounter  Medications  . furosemide (LASIX) 40 MG tablet    Sig: Take 1 tablet (40 mg total) by mouth daily.    Dispense:  90 tablet    Refill:  3  . metFORMIN (GLUCOPHAGE) 500 MG tablet    Sig: Take 1 tablet (500 mg total) by mouth 2 (two) times daily with a meal.    Dispense:  180 tablet    Refill:  3  . citalopram (CELEXA) 40 MG tablet    Sig: Take 1 tablet (40 mg total) by mouth daily.    Dispense:  90 tablet    Refill:  3     Follow-up: Return in about 6 months (around 05/19/2015) for a follow-up visit.  Walker Kehr, MD

## 2014-11-19 NOTE — Assessment & Plan Note (Addendum)
Discussed options to treat varicies No phlebitis

## 2014-11-20 ENCOUNTER — Telehealth: Payer: Self-pay | Admitting: Internal Medicine

## 2014-11-20 NOTE — Telephone Encounter (Signed)
Patient states she has not had a colonoscopy since she was probably in her early 70's.  She would like to know if she could do the cologuard test?   She is also requesting her labs to be mailed to her.

## 2014-11-20 NOTE — Telephone Encounter (Signed)
Ok Cologuard - pt needs to find out if it is covered by her insurance Thx

## 2014-11-23 NOTE — Telephone Encounter (Signed)
Patient will be checking with her insurance co to make sure cologuard is covered by her insurance, she will call our office back and advise---also mailed labs to patient per patient request

## 2014-12-24 ENCOUNTER — Ambulatory Visit: Payer: Medicare HMO

## 2014-12-24 ENCOUNTER — Other Ambulatory Visit: Payer: Self-pay | Admitting: Internal Medicine

## 2014-12-26 ENCOUNTER — Telehealth: Payer: Self-pay | Admitting: Internal Medicine

## 2014-12-26 NOTE — Telephone Encounter (Signed)
Pt is complaining of severe pain in the calf of her right leg. She called [facility] in [place] and they are wanting an order from Dr. Camila Li to be able to do an ultra sound. Beacher May is the lady she talked to over at [facility].  Her phone number is [phone] She did not give her a fax number.

## 2014-12-27 NOTE — Telephone Encounter (Signed)
Pls sch OV w/any provider ASAP. OK UC or ER in [place] Thx

## 2014-12-27 NOTE — Telephone Encounter (Signed)
Notified pt with md response. Pt states she rather see Dr. Alain Marion made appt for Tiffany Gibbs 12/19 @ 11:000..../LMB

## 2014-12-31 ENCOUNTER — Ambulatory Visit: Payer: Medicare HMO | Admitting: Internal Medicine

## 2015-04-05 ENCOUNTER — Ambulatory Visit: Payer: Medicare HMO | Admitting: Internal Medicine

## 2015-04-17 ENCOUNTER — Encounter: Payer: Self-pay | Admitting: Internal Medicine

## 2015-04-17 ENCOUNTER — Ambulatory Visit: Payer: Medicare HMO | Admitting: Internal Medicine

## 2015-04-17 ENCOUNTER — Ambulatory Visit (INDEPENDENT_AMBULATORY_CARE_PROVIDER_SITE_OTHER): Payer: Medicare HMO | Admitting: Internal Medicine

## 2015-04-17 VITALS — BP 118/78 | HR 66 | Temp 97.5°F | Wt 184.0 lb

## 2015-04-17 DIAGNOSIS — M542 Cervicalgia: Secondary | ICD-10-CM | POA: Diagnosis not present

## 2015-04-17 DIAGNOSIS — I10 Essential (primary) hypertension: Secondary | ICD-10-CM

## 2015-04-17 DIAGNOSIS — E119 Type 2 diabetes mellitus without complications: Secondary | ICD-10-CM

## 2015-04-17 DIAGNOSIS — R945 Abnormal results of liver function studies: Secondary | ICD-10-CM

## 2015-04-17 DIAGNOSIS — M545 Low back pain, unspecified: Secondary | ICD-10-CM

## 2015-04-17 DIAGNOSIS — R7989 Other specified abnormal findings of blood chemistry: Secondary | ICD-10-CM

## 2015-04-17 DIAGNOSIS — D582 Other hemoglobinopathies: Secondary | ICD-10-CM | POA: Insufficient documentation

## 2015-04-17 DIAGNOSIS — R600 Localized edema: Secondary | ICD-10-CM

## 2015-04-17 DIAGNOSIS — E538 Deficiency of other specified B group vitamins: Secondary | ICD-10-CM | POA: Diagnosis not present

## 2015-04-17 DIAGNOSIS — M5481 Occipital neuralgia: Secondary | ICD-10-CM | POA: Insufficient documentation

## 2015-04-17 MED ORDER — METHYLPREDNISOLONE ACETATE 80 MG/ML IJ SUSP
80.0000 mg | Freq: Once | INTRAMUSCULAR | Status: AC
  Administered 2015-04-17: 80 mg via INTRAMUSCULAR

## 2015-04-17 MED ORDER — VITAMIN B-12 1000 MCG SL SUBL: 1.0000 | SUBLINGUAL_TABLET | Freq: Every day | SUBLINGUAL | Status: DC

## 2015-04-17 MED ORDER — IBUPROFEN 600 MG PO TABS: 600.0000 mg | ORAL_TABLET | Freq: Two times a day (BID) | ORAL | Status: DC | PRN

## 2015-04-17 NOTE — Assessment & Plan Note (Addendum)
X ray  PT R occipital Nerve block option is discussed

## 2015-04-17 NOTE — Assessment & Plan Note (Signed)
Furosemide prn 

## 2015-04-17 NOTE — Assessment & Plan Note (Signed)
On Metformin Labs 

## 2015-04-17 NOTE — Assessment & Plan Note (Signed)
2017 ?etiology Repeat CBC

## 2015-04-17 NOTE — Assessment & Plan Note (Signed)
On B12 

## 2015-04-17 NOTE — Assessment & Plan Note (Signed)
Nerve block option is discussed

## 2015-04-17 NOTE — Assessment & Plan Note (Signed)
LFTs 

## 2015-04-17 NOTE — Assessment & Plan Note (Signed)
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

## 2015-04-17 NOTE — Progress Notes (Signed)
Subjective:  Patient ID: Tiffany Gibbs, female    DOB: 05-06-1945  Age: 70 y.o. MRN: [id number]  CC: No chief complaint on file.   HPI Tiffany Gibbs presents for back and neck pain. C/o HAs on the R  Side in the back - weeks. C/o R knee pain - chronic C/o R LBP and hip pain - chronic C/o pains in the legs, feet are numb F/u elev Hgb and dyslipidemia  Outpatient Prescriptions Prior to Visit  Medication Sig Dispense Refill  . aspirin EC 81 MG tablet Take 1 tablet (81 mg total) by mouth daily. 100 tablet 3  . Cholecalciferol (VITAMIN D) 1000 UNITS capsule Take 1 capsule (1,000 Units total) by mouth daily. 100 capsule 3  . ciclopirox (PENLAC) 8 % solution Apply topically at bedtime. Apply over nail and surrounding skin. Apply daily over previous coat. After seven (7) days, may remove with alcohol and continue cycle. 6.6 mL 3  . citalopram (CELEXA) 40 MG tablet Take 1 tablet (40 mg total) by mouth daily. 90 tablet 3  . clotrimazole-betamethasone (LOTRISONE) cream Apply 1 application topically 2 (two) times daily. 45 g 1  . Cyanocobalamin (VITAMIN B-12) 1000 MCG SUBL Place 5,000 tablets under the tongue daily.     . diphenhydrAMINE (BENADRYL) 25 MG tablet Take 25 mg by mouth at bedtime.    [name] doxycycline (VIBRA-TABS) 100 MG tablet Take 1 tablet (100 mg total) by mouth 2 (two) times daily. (Patient not taking: Reported on 11/19/2014) 20 tablet 0  . fluticasone (FLONASE) 50 MCG/ACT nasal spray Place 1 spray into both nostrils daily as needed for allergies or rhinitis.    . furosemide (LASIX) 40 MG tablet Take 1 tablet (40 mg total) by mouth daily. 90 tablet 3  . ibuprofen (ADVIL,MOTRIN) 600 MG tablet Take 1 tablet (600 mg total) by mouth 2 (two) times daily as needed. 60 tablet 3  . loratadine (CLARITIN) 10 MG tablet Take 10 mg by mouth daily.    . metFORMIN (GLUCOPHAGE) 500 MG tablet Take 1 tablet (500 mg total) by mouth 2 (two) times daily with a meal. 180 tablet 3  . metFORMIN (GLUCOPHAGE)  500 MG tablet TAKE ONE TABLET BY MOUTH TWICE DAILY WITH  A  MEAL 180 tablet 1  . Omega-3 Fatty Acids (FISH OIL) 1200 MG CAPS Take 2,400 mg by mouth 2 (two) times daily.    . promethazine-codeine (PHENERGAN WITH CODEINE) 6.25-10 MG/5ML syrup Take 5 mLs by mouth every 4 (four) hours as needed. (Patient not taking: Reported on 11/19/2014) 300 mL 0  . triamcinolone cream (KENALOG) 0.5 % Apply 1 application topically 3 (three) times daily as needed (to rash).    . Umeclidinium-Vilanterol (ANORO ELLIPTA) 62.5-25 MCG/INH AEPB Inhale 1 Act into the lungs daily. (Patient not taking: Reported on 11/19/2014) 1 each 3   No facility-administered medications prior to visit.    ROS Review of Systems  Constitutional: Negative for chills, activity change, appetite change, fatigue and unexpected weight change.  HENT: Negative for congestion, mouth sores and sinus pressure.   Eyes: Negative for visual disturbance.  Respiratory: Negative for cough and chest tightness.   Gastrointestinal: Negative for nausea and abdominal pain.  Genitourinary: Negative for frequency, difficulty urinating and vaginal pain.  Musculoskeletal: Positive for back pain, arthralgias, gait problem, neck pain and neck stiffness. Negative for myalgias.  Skin: Negative for pallor and rash.  Neurological: Positive for headaches. Negative for dizziness, tremors, weakness and numbness.  Psychiatric/Behavioral: Negative for confusion and  sleep disturbance. The patient is not nervous/anxious.     Objective:  BP 118/78 mmHg  Pulse 66  Temp(Src) 97.5 F (36.4 C)  Wt 184 lb (83.462 kg)  SpO2 98%  BP Readings from Last 3 Encounters:  04/17/15 118/78  11/19/14 124/70  10/15/14 118/80    Wt Readings from Last 3 Encounters:  04/17/15 184 lb (83.462 kg)  11/19/14 182 lb 4 oz (82.668 kg)  10/15/14 182 lb (82.555 kg)    Physical Exam  Constitutional: She appears well-developed. No distress.  HENT:  Head: Normocephalic.  Right Ear:  External ear normal.  Left Ear: External ear normal.  Nose: Nose normal.  Mouth/Throat: Oropharynx is clear and moist.  Eyes: Conjunctivae are normal. Pupils are equal, round, and reactive to light. Right eye exhibits no discharge. Left eye exhibits no discharge.  Neck: Normal range of motion. Neck supple. No JVD present. No tracheal deviation present. No thyromegaly present.  Cardiovascular: Normal rate, regular rhythm and normal heart sounds.   Pulmonary/Chest: No stridor. No respiratory distress. She has no wheezes.  Abdominal: Soft. Bowel sounds are normal. She exhibits no distension and no mass. There is no tenderness. There is no rebound and no guarding.  Musculoskeletal: She exhibits tenderness. She exhibits no edema.  Lymphadenopathy:    She has no cervical adenopathy.  Neurological: She displays normal reflexes. No cranial nerve deficit. She exhibits normal muscle tone. Coordination normal.  Skin: No rash noted. No erythema.  Psychiatric: She has a normal mood and affect. Her behavior is normal. Judgment and thought content normal.  R occip nerve area is tender LS, R buttock - tender R hip tender laterally Neck, LS sine ROM is decreased  Lab Results  Component Value Date   WBC 9.4 11/19/2014   HGB 16.0* 11/19/2014   HCT 47.2* 11/19/2014   PLT 280.0 11/19/2014   GLUCOSE 96 11/19/2014   CHOL 291* 11/19/2014   TRIG 120.0 11/19/2014   HDL 75.80 11/19/2014   LDLDIRECT 148.2 02/03/2013   LDLCALC 191* 11/19/2014   ALT 17 11/19/2014   AST 18 11/19/2014   NA 139 11/19/2014   K 3.6 11/19/2014   CL 99 11/19/2014   CREATININE 1.07 11/19/2014   BUN 17 11/19/2014   CO2 29 11/19/2014   TSH 2.01 11/19/2014   INR 1.14 11/21/2012   HGBA1C 5.7 11/19/2014    Dg Chest 2 View  09/26/2014  CLINICAL DATA:  Cough and shortness of breath for 1 month. EXAM: CHEST  2 VIEW COMPARISON:  07/27/2012 FINDINGS: The heart size and mediastinal contours are within normal limits. No evidence of  pulmonary infiltrate or edema. No evidence of pleural effusion. The visualized skeletal structures are unremarkable. IMPRESSION: Stable exam.  No active cardiopulmonary disease. Electronically Signed   By: Earle Gell M.D.   On: 09/26/2014 19:19    Assessment & Plan:   There are no diagnoses linked to this encounter. I am having Ms. Koogler maintain her Vitamin B-12, Vitamin D, Fish Oil, diphenhydrAMINE, loratadine, fluticasone, triamcinolone cream, ciclopirox, aspirin EC, clotrimazole-betamethasone, umeclidinium-vilanterol, promethazine-codeine, doxycycline, ibuprofen, furosemide, metFORMIN, citalopram, and metFORMIN.  No orders of the defined types were placed in this encounter.     Follow-up: No Follow-up on file.  Walker Kehr, MD

## 2015-04-17 NOTE — Progress Notes (Signed)
Pre visit review using our clinic review tool, if applicable. No additional management support is needed unless otherwise documented below in the visit note. 

## 2015-04-17 NOTE — Patient Instructions (Addendum)
Hip opener exercises Chair yoga      Piriformis Syndrome With Rehab Piriformis syndrome is a condition the affects the nervous system in the area of the hip, and is characterized by pain and possibly a loss of feeling in the backside (posterior) thigh that may extend down the entire length of the leg. The symptoms are caused by an increase in pressure on the sciatic nerve by the piriformis muscle, which is on the back of the hip and is responsible for externally rotating the hip. The sciatic nerve and its branches connect to much of the leg. Normally the sciatic nerve runs between the piriformis muscle and other muscles. However, in certain individuals the nerve runs through the muscle, which causes an increase in pressure on the nerve and results in the symptoms of piriformis syndrome. SYMPTOMS   Pain, tingling, numbness, or burning in the back of the thigh that may also extend down the entire leg.  Occasionally, tenderness in the buttock.  Loss of function of the leg.  Pain that worsens when using the piriformis muscle (running, jumping, or stairs).  Pain that increases with prolonged sitting.  Pain that is lessened by lying flat on the back. CAUSES   Piriformis syndrome is the result of an increase in pressure placed on the sciatic nerve. Oftentimes, piriformis syndrome is an overuse injury.  Stress placed on the nerve from a sudden increase in the intensity, frequency, or duration of training.  Compensation of other extremity injuries. RISK INCREASES WITH:  Sports that involve the piriformis muscle (running, walking, or jumping).  You are born with (congenital) a defect in which the sciatic nerve passes through the muscle. PREVENTION  Warm up and stretch properly before activity.  Allow for adequate recovery between workouts.  Maintain physical fitness:  Strength, flexibility, and endurance.  Cardiovascular fitness. PROGNOSIS  If treated properly, the symptoms of  piriformis syndrome usually resolve in 2 to 6 weeks. RELATED COMPLICATIONS   Persistent and possibly permanent pain and numbness in the lower extremity.  Weakness of the extremity that may progress to disability and inability to compete. TREATMENT  The most effective treatment for piriformis syndrome is rest from any activities that aggravate the symptoms. Ice and pain medication may help reduce pain and inflammation. The use of strengthening and stretching exercises may help reduce pain with activity. These exercises may be performed at home or with a therapist. A referral to a therapist may be given for further evaluation and treatment, such as ultrasound. Corticosteroid injections may be given to reduce inflammation that is causing pressure to be placed on the sciatic nerve. If nonsurgical (conservative) treatment is unsuccessful, then surgery may be recommended.  MEDICATION   If pain medication is necessary, then nonsteroidal anti-inflammatory medications, such as aspirin and ibuprofen, or other minor pain relievers, such as acetaminophen, are often recommended.  Do not take pain medication for 7 days before surgery.  Prescription pain relievers may be given if deemed necessary by your caregiver. Use only as directed and only as much as you need.  Corticosteroid injections may be given by your caregiver. These injections should be reserved for the most serious cases, because they may only be given a certain number of times. HEAT AND COLD:   Cold treatment (icing) relieves pain and reduces inflammation. Cold treatment should be applied for 10 to 15 minutes every 2 to 3 hours for inflammation and pain and immediately after any activity that aggravates your symptoms. Use ice packs or massage the  area with a piece of ice (ice massage).  Heat treatment may be used prior to performing the stretching and strengthening activities prescribed by your caregiver, physical therapist, or athletic trainer.  Use a heat pack or soak the injury in warm water. SEEK IMMEDIATE MEDICAL CARE IF:  Treatment seems to offer no benefit, or the condition worsens.  Any medications produce adverse side effects. EXERCISES RANGE OF MOTION (ROM) AND STRETCHING EXERCISES - Piriformis Syndrome These exercises may help you when beginning to rehabilitate your injury. Your symptoms may resolve with or without further involvement from your physician, physical therapist, or athletic trainer. While completing these exercises, remember:   Restoring tissue flexibility helps normal motion to return to the joints. This allows healthier, less painful movement and activity.  An effective stretch should be held for at least 30 seconds.  A stretch should never be painful. You should only feel a gentle lengthening or release in the stretched tissue. STRETCH - Hip Rotators  Lie on your back on a firm surface. Grasp your right / left knee with your right / left hand and your ankle with your opposite hand.  Keeping your hips and shoulders firmly planted, gently pull your right / left knee and rotate your lower leg toward your opposite shoulder until you feel a stretch in your buttocks.  Hold this stretch for __________ seconds. Repeat this stretch __________ times. Complete this stretch __________ times per day. STRETCH - Iliotibial Band  On the floor or bed, lie on your side so your right / left leg is on top. Bend your knee and grab your ankle.  Slowly bring your knee back so that your thigh is in line with your trunk. Keep your heel at your buttocks and gently arch your back so your head, shoulders, and hips line up.  Slowly lower your leg so that your knee approaches the floor/bed until you feel a gentle stretch on the outside of your right / left thigh. If you do not feel a stretch and your knee will not fall farther, place the heel of your opposite foot on top of your knee and pull your thigh down farther.  Hold this  stretch for __________ seconds. Repeat __________ times. Complete __________ times per day. STRENGTHENING EXERCISES - Piriformis Syndrome  These are some of the caregiver again or until your symptoms are resolved. Remember:   Strong muscles with good endurance tolerate stress better.  Do the exercises as initially prescribed by your caregiver. Progress slowly with each exercise, gradually increasing the number of repetitions and weight used under their guidance. STRENGTH - Hip Abductors, Straight Leg Raises Be aware of your form throughout the entire exercise so that you exercise the correct muscles. Sloppy form means that you are not strengthening the correct muscles.  Lie on your side so that your head, shoulders, knee, and hip line up. You may bend your lower knee to help maintain your balance. Your right / left leg should be on top.  Roll your hips slightly forward, so that your hips are stacked directly over each other and your right / left knee is facing forward.  Lift your top leg up 4-6 inches, leading with your heel. Be sure that your foot does not drift forward or that your knee does not roll toward the ceiling.  Hold this position for __________ seconds. You should feel the muscles in your outer hip lifting (you may not notice this until your leg begins to tire).  Slowly lower your  leg to the starting position. Allow the muscles to fully relax before beginning the next repetition. Repeat __________ times. Complete this exercise __________ times per day.  STRENGTH - Hip Abductors, Quadruped  On a firm, lightly padded surface, position yourself on your hands and knees. Your hands should be directly below your shoulders and your knees should be directly below your hips.  Keeping your right / left knee bent, lift your leg out to the side. Keep your legs level and in line with your shoulders.  Position yourself on your hands and knees.  Hold for __________ seconds.  Keeping your  trunk steady and your hips level, slowly lower your leg to the starting position. Repeat __________ times. Complete this exercise __________ times per day.  STRENGTH - Hip Abductors, Standing  Tie one end of a rubber exercise band/tubing to a secure surface (table, pole) and tie a loop at the other end.  Place the loop around your right / left ankle. Keeping your ankle with the band directly opposite of the secured end, step away until there is tension in the tube/band.  Hold onto a chair as needed for balance.  Keeping your back upright, your shoulders over your hips, and your toes pointing forward, lift your right / left leg out to your side. Be sure to lift your leg with your hip muscles. Do not "throw" your leg or tip your body to lift your leg.  Slowly and with control, return to the starting position. Repeat exercise __________ times. Complete this exercise __________ times per day.    This information is not intended to replace advice given to you by your health care provider. Make sure you discuss any questions you have with your health care provider.     Occipital Neuralgia Occipital neuralgia is a type of headache that causes episodes of very bad pain in the back of your head. Pain from occipital neuralgia may spread (radiate) to other parts of your head. The pain is usually brief and often goes away after you rest and relax. These headaches may be caused by irritation of the nerves that leave your spinal cord high up in your neck, just below the base of your skull (occipital nerves). Your occipital nerves transmit sensations from the back of your head, the top of your head, and the areas behind your ears. CAUSES Occipital neuralgia can occur without any known cause (primary headache syndrome). In other cases, occipital neuralgia is caused by pressure on or irritation of one of the two occipital nerves. Causes of occipital nerve compression or irritation include:  Wear and tear of  the vertebrae in the neck (osteoarthritis).  Neck injury.  Disease of the disks that separate the vertebrae.  Tumors.  Gout.  Infections.  Diabetes.  Swollen blood vessels that put pressure on the occipital nerves.  Muscle spasm in the neck. SIGNS AND SYMPTOMS Pain is the main symptom of occipital neuralgia. It usually starts in the back of the head but may also be felt in other areas supplied by the occipital nerves. Pain is usually on one side but may be on both sides. You may have:   Brief episodes of very bad pain that is burning, stabbing, shocking, or shooting.  Pain behind the eye.  Pain triggered by neck movement or hair brushing.  Scalp tenderness.  Aching in the back of the head between episodes of very bad pain. DIAGNOSIS  Your health care provider may diagnose occipital neuralgia based on your symptoms and  a physical exam. During the exam, the health care provider may push on areas supplied by the occipital nerves to see if they are painful. Some tests may also be done to help in making the diagnosis. These may include:  Imaging studies of the upper spinal cord, such as an MRI or CT scan. These may show compression or spinal cord abnormalities.  Nerve block. You will get an injection of numbing medicine (local anesthetic) near the occipital nerve to see if this relieves pain. TREATMENT  Treatment may begin with simple measures, such as:   Rest.  Massage.  Heat.  Over-the-counter pain relievers. If these measures do not work, you may need other treatments, including:  Medicines such as:  Prescription-strength anti-inflammatory medicines.  Muscle relaxants.  Antiseizure medicines.  Antidepressants.  Steroid injection. This involves injections of local anesthetic and strong anti-inflammatory drugs (steroids).  Pulsed radiofrequency. Wires are implanted to deliver electrical impulses that block pain signals from the occipital nerve.  Physical  therapy.  Surgery to relieve nerve pressure. HOME CARE INSTRUCTIONS  Take all medicines as directed by your health care provider.  Avoid activities that cause pain.  Rest when you have an attack of pain.  Try gentle massage or a heating pad to relieve pain.  Work with a physical therapist to learn stretching exercises you can do at home.  Try a different pillow or sleeping position.  Practice good posture.  Try to stay active. Get regular exercise that does not cause pain. Ask your health care provider to suggest safe exercises for you.  Keep all follow-up visits as directed by your health care provider. This is important. SEEK MEDICAL CARE IF:  Your medicine is not working.  You have new or worsening symptoms. SEEK IMMEDIATE MEDICAL CARE IF:  You have very bad head pain that is not going away.  You have a sudden change in vision, balance, or speech. MAKE SURE YOU:  Understand these instructions.  Will watch your condition.  Will get help right away if you are not doing well or get worse.   This information is not intended to replace advice given to you by your health care provider. Make sure you discuss any questions you have with your health care provider.   Document Released: 12/23/2000 Document Revised: 01/19/2014 Document Reviewed: 12/21/2012 Elsevier Interactive Patient Education 2016 [place] Released: 12/29/2004 Document Revised: 05/15/2014 Document Reviewed: 04/12/2008 Elsevier Interactive Patient Education [employer].

## 2015-04-25 ENCOUNTER — Telehealth: Payer: Self-pay | Admitting: Internal Medicine

## 2015-04-25 ENCOUNTER — Other Ambulatory Visit (INDEPENDENT_AMBULATORY_CARE_PROVIDER_SITE_OTHER): Payer: Medicare HMO

## 2015-04-25 DIAGNOSIS — E119 Type 2 diabetes mellitus without complications: Secondary | ICD-10-CM

## 2015-04-25 DIAGNOSIS — R42 Dizziness and giddiness: Secondary | ICD-10-CM | POA: Diagnosis not present

## 2015-04-25 DIAGNOSIS — D582 Other hemoglobinopathies: Secondary | ICD-10-CM | POA: Diagnosis not present

## 2015-04-25 LAB — CBC WITH DIFFERENTIAL/PLATELET
BASOS PCT: 0.6 % (ref 0.0–3.0)
Basophils Absolute: 0 10*3/uL (ref 0.0–0.1)
EOS ABS: 0.2 10*3/uL (ref 0.0–0.7)
Eosinophils Relative: 2.3 % (ref 0.0–5.0)
HEMATOCRIT: 39.8 % (ref 36.0–46.0)
Hemoglobin: 13.6 g/dL (ref 12.0–15.0)
LYMPHS ABS: 1.9 10*3/uL (ref 0.7–4.0)
Lymphocytes Relative: 25.9 % (ref 12.0–46.0)
MCHC: 34.1 g/dL (ref 30.0–36.0)
MCV: 87 fl (ref 78.0–100.0)
Monocytes Absolute: 0.7 10*3/uL (ref 0.1–1.0)
Monocytes Relative: 9.1 % (ref 3.0–12.0)
NEUTROS ABS: 4.5 10*3/uL (ref 1.4–7.7)
NEUTROS PCT: 62.1 % (ref 43.0–77.0)
PLATELETS: 241 10*3/uL (ref 150.0–400.0)
RBC: 4.57 Mil/uL (ref 3.87–5.11)
RDW: 12.9 % (ref 11.5–15.5)
WBC: 7.3 10*3/uL (ref 4.0–10.5)

## 2015-04-25 LAB — LIPID PANEL
CHOLESTEROL: 217 mg/dL — AB (ref 0–200)
HDL: 50.6 mg/dL (ref >39.00)
LDL CALC: 146 mg/dL — AB (ref 0–99)
NonHDL: 166.55
TRIGLYCERIDES: 105 mg/dL (ref 0.0–149.0)
Total CHOL/HDL Ratio: 4
VLDL: 21 mg/dL (ref 0.0–40.0)

## 2015-04-25 NOTE — Telephone Encounter (Signed)
LVM for pt to call back as soon as possible.   

## 2015-04-25 NOTE — Telephone Encounter (Signed)
Patient stated that she went to the Urgent Care in [place]. MD is wanting pt to get an MRI. [phone] is the number for the Urgent Care.

## 2015-04-25 NOTE — Telephone Encounter (Signed)
Recommend follow up at Urgent Care for evaluation.

## 2015-04-25 NOTE — Telephone Encounter (Signed)
Forwarding to [facility] for review.

## 2015-04-25 NOTE — Telephone Encounter (Signed)
[name] - Client [facility]     Patient Name: Tiffany Gibbs Initial Comment Caller states she has had 3 dizzy spells this week, one just now.  DOB: 12-10-1945      Nurse Assessment  Nurse: Luther Parody RN, Malachy Mood Date/Time (Eastern Time): 04/25/2015 12:28:15 PM  Confirm and document reason for call. If symptomatic, describe symptoms. You must click the next button to save text entered. ---Caller states that she has been experiencing brief intermittent periods of dizziness for the last few months and has had 3 so far this week.  Has the patient traveled out of the country within the last 30 days? ---Not Applicable  Does the patient have any new or worsening symptoms? ---Yes  Will a triage be completed? ---Yes  Related visit to physician within the last 2 weeks? ---Yes  Does the PT have any chronic conditions? (i.e. diabetes, asthma, etc.) ---Yes  List chronic conditions. ---diabetes  Is this a behavioral health or substance abuse call? ---No    Guidelines     Guideline Title Affirmed Question Affirmed Notes   Dizziness - Lightheadedness Taking a medicine that could cause dizziness (e.g., blood pressure medications, diuretics)    Final Disposition User   See Physician within [place], RN, [employer]     Referrals   GO TO FACILITY OTHER - SPECIFY   Disagree/Comply: Comply

## 2015-04-30 ENCOUNTER — Encounter: Payer: Self-pay | Admitting: Diagnostic Neuroimaging

## 2015-04-30 ENCOUNTER — Telehealth: Payer: Self-pay | Admitting: Internal Medicine

## 2015-04-30 ENCOUNTER — Ambulatory Visit (INDEPENDENT_AMBULATORY_CARE_PROVIDER_SITE_OTHER): Payer: Medicare HMO | Admitting: Diagnostic Neuroimaging

## 2015-04-30 VITALS — Ht 63.5 in | Wt 184.8 lb

## 2015-04-30 DIAGNOSIS — G4733 Obstructive sleep apnea (adult) (pediatric): Secondary | ICD-10-CM

## 2015-04-30 DIAGNOSIS — G43009 Migraine without aura, not intractable, without status migrainosus: Secondary | ICD-10-CM | POA: Diagnosis not present

## 2015-04-30 DIAGNOSIS — R42 Dizziness and giddiness: Secondary | ICD-10-CM

## 2015-04-30 DIAGNOSIS — G450 Vertebro-basilar artery syndrome: Secondary | ICD-10-CM

## 2015-04-30 DIAGNOSIS — I951 Orthostatic hypotension: Secondary | ICD-10-CM

## 2015-04-30 NOTE — Patient Instructions (Signed)
Thank you for coming to see [place] at [facility] : [facility]. I hope we have been able to provide you high quality care today.  You may receive a patient satisfaction survey over the next few weeks. We would appreciate your feedback and comments so that we may continue to improve ourselves and the health of our patients.  STROKE PREVENTION - check MRI / MRA scans and TTE (heart ultrasound) - continue aspirin - continue diabetes treatment - LDL cholesterol 146; goal < 100  DIZZINESS - monitor BP at home - vestibular PT ([facility])  HEADACHES - monitor symptoms - ibuprofen as needed   SLEEP APNEA - check sleep study (last tested / treated ~ 15-20 years ago)   ~~~~~~~~~~~~~~~~~~~~~~~~~~~~~~~~~~~~~~~~~~~~~~~~~~~~~~~~~~~~~~~~~  DR. Dorthea Maina'S GUIDE TO HAPPY AND HEALTHY LIVING These are some of my general health and wellness recommendations. Some of them may apply to you better than others. Please use common sense as you try these suggestions and feel free to ask me any questions.   ACTIVITY/FITNESS Mental, social, emotional and physical stimulation are very important for brain and body health. Try learning a new activity (arts, music, language, sports, games).  Keep moving your body to the best of your abilities. You can do this at home, inside or outside, the park, community center, gym or anywhere you like. Consider a physical therapist or personal trainer to get started. Consider the app Sworkit. Fitness trackers such as smart-watches, smart-phones or Fitbits can help as well.   NUTRITION Eat more plants: colorful vegetables, nuts, seeds and berries.  Eat less sugar, salt, preservatives and processed foods.  Avoid toxins such as cigarettes and alcohol.  Drink water when you are thirsty. Warm water with a slice of lemon is an excellent morning drink to start the day.  Consider these websites for more information The Nutrition Source  (https://www.henry-hernandez.biz/) Precision Nutrition (WindowBlog.ch)   RELAXATION Consider practicing mindfulness meditation or other relaxation techniques such as deep breathing, prayer, yoga, tai chi, massage. See website mindful.org or the apps Headspace or Calm to help get started.   SLEEP Try to get at least 7-8+ hours sleep per day. Regular exercise and reduced caffeine will help you sleep better. Practice good sleep hygeine techniques. See website sleep.org for more information.   PLANNING Prepare estate planning, living will, healthcare POA documents. Sometimes this is best planned with the help of an attorney. Theconversationproject.org and agingwithdignity.org are excellent resources.

## 2015-04-30 NOTE — Progress Notes (Signed)
[facility]  PATIENT: Tiffany Gibbs DOB: [birth year]  REFERRING CLINICIAN: Owens Shark  HISTORY FROM: patient and (EPIC chart review and referring MD notes review and summarized in note below) REASON FOR VISIT: new consult    HISTORICAL  CHIEF COMPLAINT:  Chief Complaint  Patient presents with  . Headache    rm 6, New Pt, "increased HA, but better now; Ibuprofen 600 mg"  . Dizziness    "3-4 x week this past month; prior was happening 1-2 times every couple weeks since Nov 2016; episodes lasts about 5-6 seconds"    HISTORY OF PRESENT ILLNESS:   70 year old right-handed female here for evaluation of headaches and dizziness.  Patient has history of migraine headaches in her 21s with severe global headaches with photophobia. No nausea vomiting. Headaches seem to resolve by the time she was 70 years old.  Since February 2017 patient has had intermittent lightheaded sensations, room spinning sensations, dizzy spells lasting a few seconds at a time. Symptoms would occur a few times per week. Positional changes would seem to trigger headaches such as turning, bending, sitting up or standing up. No nausea or vomiting. No passing out, slurred speech, trouble talking, double vision. Patient would have some balance difficulties and feeling like she could fall down and have to hold onto something. In April 2017 patient was at [employer] center and had a severe episode that scared her.  In addition since November 2016 patient has been having increasing increasing right-sided headaches, right-sided neck pain, right occipital pain with pounding sensation lasting for 2 hours. No nausea or vomiting. No photophobia or phonophobia. Patient having at least 2 headaches per month.    REVIEW OF SYSTEMS: Full 14 system review of systems performed and negative with exception of: Decreased energy dizziness restless leg snoring numbness headache memory loss easy bruising feeling hot feeling  cold joint pains shortness of breath fatigue swelling in legs itching ringing in ears spinning sensation.   ALLERGIES: Allergies  Allergen Reactions  . Celecoxib     swell  . Enalapril     cough  . Losartan     falls  . Lovastatin     REACTION: aches  . Naproxen     HOME MEDICATIONS: Outpatient Prescriptions Prior to Visit  Medication Sig Dispense Refill  . aspirin EC 81 MG tablet Take 1 tablet (81 mg total) by mouth daily. 100 tablet 3  . Cholecalciferol (VITAMIN D) 1000 UNITS capsule Take 1 capsule (1,000 Units total) by mouth daily. 100 capsule 3  . ciclopirox (PENLAC) 8 % solution Apply topically at bedtime. Apply over nail and surrounding skin. Apply daily over previous coat. After seven (7) days, may remove with alcohol and continue cycle. 6.6 mL 3  . citalopram (CELEXA) 40 MG tablet Take 1 tablet (40 mg total) by mouth daily. 90 tablet 3  . clotrimazole-betamethasone (LOTRISONE) cream Apply 1 application topically 2 (two) times daily. 45 g 1  . Cyanocobalamin (VITAMIN B-12) 1000 MCG SUBL Place 1 tablet (1,000 mcg total) under the tongue daily. 100 tablet 3  . diphenhydrAMINE (BENADRYL) 25 MG tablet Take 25 mg by mouth at bedtime.    . fluticasone (FLONASE) 50 MCG/ACT nasal spray Place 1 spray into both nostrils daily as needed for allergies or rhinitis.    . furosemide (LASIX) 40 MG tablet Take 1 tablet (40 mg total) by mouth daily. 90 tablet 3  . ibuprofen (ADVIL,MOTRIN) 600 MG tablet Take 1 tablet (600 mg total) by mouth 2 (  two) times daily as needed. 60 tablet 3  . loratadine (CLARITIN) 10 MG tablet Take 10 mg by mouth daily.    . metFORMIN (GLUCOPHAGE) 500 MG tablet Take 1 tablet (500 mg total) by mouth 2 (two) times daily with a meal. 180 tablet 3  . Omega-3 Fatty Acids (FISH OIL) 1200 MG CAPS Take 2,400 mg by mouth 2 (two) times daily.    [name] triamcinolone cream (KENALOG) 0.5 % Apply 1 application topically 3 (three) times daily as needed (to rash).     No  facility-administered medications prior to visit.    PAST MEDICAL HISTORY: Past Medical History  Diagnosis Date  . Anxiety   . Depression   . HTN (hypertension)   . Osteoarthritis   . DM type 2 (diabetes mellitus, type 2) ([place])   . Low back pain   . Osteoporosis   . Hyperlipidemia   . Hives     from tomatoes  . Fibromyalgia   . OSA (obstructive sleep apnea)     cataracts    PAST SURGICAL HISTORY: Past Surgical History  Procedure Laterality Date  . Total abdominal hysterectomy    . Cervical cryotherapy      cervix  . 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 Lasik Bilateral   . Eye surgery    . Total hip arthroplasty Left 11/28/2012    Procedure: TOTAL HIP ARTHROPLASTY;  Surgeon: Kerin Salen, MD;  Location: [facility];  Service: Orthopedics;  Laterality: Left;    FAMILY HISTORY: Family History  Problem Relation Age of Onset  . Hypertension Mother   . Heart disease Father   . Multiple sclerosis Father   . Heart attack Father     SOCIAL HISTORY:  Social History   Social History  . Marital Status: Married    Spouse Name: Sam  . Number of Children: 1  . Years of Education: 12,classes   Occupational History  . retired Banker    Social History Main Topics  . Smoking status: Former Smoker -- 2.00 packs/day for 25 years    Types: Cigarettes    Quit date: 01/13/1983  . Smokeless tobacco: Never Used  . Alcohol Use: No  . Drug Use: No  . Sexual Activity: Yes   Other Topics Concern  . Not on file   Social History Narrative   Was divorced - newly re-married as of 2009, Sam   1 son      Caffeine use- coffee 1 cup daily        PHYSICAL EXAM  GENERAL EXAM/CONSTITUTIONAL: Vitals:  Filed Vitals:   04/30/15 1057  Height: 5' 3.5" (1.613 m)  Weight: 184 lb 12.8 oz (83.825 kg)    Orthostatic VS for the past 24 hrs:  BP- Lying Pulse- Lying BP- Sitting Pulse- Sitting BP- Standing at 0 minutes Pulse- Standing at 0 minutes  04/30/15 1112 142/81 mmHg  65 143/77 mmHg 61 126/73 mmHg 62    Body mass index is 32.22 kg/(m^2).  Visual Acuity Screening   Right eye Left eye Both eyes  Without correction: 20/40 20/50   With correction:        Patient is in no distress; well developed, nourished and groomed; neck is supple  CARDIOVASCULAR:  Examination of carotid arteries is normal; no carotid bruits  Regular rate and rhythm, no murmurs  Examination of peripheral vascular system by observation and palpation is normal  EYES:  Ophthalmoscopic exam of optic discs and posterior segments is  normal; no papilledema or hemorrhages  MUSCULOSKELETAL:  Gait, strength, tone, movements noted in Neurologic exam below  NEUROLOGIC: MENTAL STATUS:  MMSE - Mini Mental State Exam 11/19/2014  Not completed: (No Data)    awake, alert, oriented to person, place and time  recent and remote memory intact  normal attention and concentration  language fluent, comprehension intact, naming intact,   fund of knowledge appropriate  CRANIAL NERVE:   2nd - no papilledema on fundoscopic exam  2nd, 3rd, 4th, 6th - pupils equal and reactive to light, visual fields full to confrontation, extraocular muscles intact, no nystagmus  5th - facial sensation symmetric  7th - facial strength symmetric  8th - hearing intact  9th - palate elevates symmetrically, uvula midline  11th - shoulder shrug symmetric  12th - tongue protrusion midline  MOTOR:   normal bulk and tone, full strength in the BUE, BLE  SENSORY:   normal and symmetric to light touch, temperature, vibration; DECR VIB AT TOES (5 SEC)  COORDINATION:   finger-nose-finger, fine finger movements normal  REFLEXES:   deep tendon reflexes present and symmetric; TRACE AT ANKLES  GAIT/STATION:   narrow based gait; able to walk tandem; ROMBERG --> EYES CLOSED LEANS TO LEFT SIDE    DIAGNOSTIC DATA (LABS, IMAGING, TESTING) - I reviewed patient records, labs, notes, testing and  imaging myself where available.  Lab Results  Component Value Date   WBC 7.3 04/25/2015   HGB 13.6 04/25/2015   HCT 39.8 04/25/2015   MCV 87.0 04/25/2015   PLT 241.0 04/25/2015      Component Value Date/Time   NA 139 11/19/2014 1347   K 3.6 11/19/2014 1347   CL 99 11/19/2014 1347   CO2 29 11/19/2014 1347   GLUCOSE 96 11/19/2014 1347   BUN 17 11/19/2014 1347   CREATININE 1.07 11/19/2014 1347   CALCIUM 10.1 11/19/2014 1347   PROT 8.0 11/19/2014 1347   ALBUMIN 4.7 11/19/2014 1347   AST 18 11/19/2014 1347   ALT 17 11/19/2014 1347   ALKPHOS 57 11/19/2014 1347   BILITOT 0.7 11/19/2014 1347   GFRNONAA 73* 11/29/2012 0725   GFRAA 85* 11/29/2012 0725   Lab Results  Component Value Date   CHOL 217* 04/25/2015   HDL 50.60 04/25/2015   LDLCALC 146* 04/25/2015   LDLDIRECT 148.2 02/03/2013   TRIG 105.0 04/25/2015   CHOLHDL 4 04/25/2015   Lab Results  Component Value Date   HGBA1C 5.7 11/19/2014   Lab Results  Component Value Date   VITAMINB12 >1500* 11/19/2014   Lab Results  Component Value Date   TSH 2.01 11/19/2014   08/30/04 MRI cervical spine [report only] 1. There is multilevel spondylosis as noted on the patient's prior radiographs, most advanced at C4-5 and C5-6 levels and resulting in biforaminal stenosis at C4-5 and left-sided foraminal narrowing at C5-6.   2. There is no central stenosis or cord deformity.  12/01/12 EKG [I reviewed images myself and agree with interpretation. -[facility]P]  - normal sinus rhythm  09/26/14 CXR [I reviewed images myself and agree with interpretation. -[facility]P]  - Stable exam. No active cardiopulmonary disease.    ASSESSMENT AND PLAN   70 y.o. year old female here with New onset dizziness, vertigo, lightheadedness, typically triggered by position change. Also with balance and gait difficulty and headaches. Unclear whether these are unified by single diagnosis represent several different causes.    Ddx dizziness: peripheral  vestibulopathy (BPV) vs central (vertebrobasilar insufficiency) vs orthostatic hypotension vs diabetic neuropathy  Ddx headaches: migraine variant vs secondary headache  1. Dizziness and giddiness   2. Vertebrobasilar insufficiency   3. Orthostatic hypotension   4. Migraine without aura and without status migrainosus, not intractable   5. OSA (obstructive sleep apnea)      PLAN: STROKE PREVENTION - check MRI / MRA scans and TTE - continue aspirin - continue diabetes treatment - LDL 146; goal < 100  DIZZINESS - monitor BP at home - vestibular PT ([facility])  HEADACHES - monitor symptoms - ibuprofen prn  SLEEP APNEA - check sleep study (last tested / treated ~ 15-20 years ago)   Orders Placed This Encounter  Procedures  . MR Brain Wo Contrast  . MR MRA HEAD WO CONTRAST  . MR Angiogram Neck W Wo Contrast  . Ambulatory referral to Sleep Studies  . ECHOCARDIOGRAM COMPLETE   No Follow-up on file.  I reviewed images, labs, notes, records myself. I summarized findings and reviewed with patient, for this high risk condition (dizziness, vertigo, possible stroke, possible near syncope) requiring high complexity decision making.     Penni Bombard, MD [id number], [id number] PM Certified in Neurology, Neurophysiology and Neuroimaging  [facility] [address], [place], [place] [address] [phone]

## 2015-05-08 ENCOUNTER — Ambulatory Visit (INDEPENDENT_AMBULATORY_CARE_PROVIDER_SITE_OTHER): Payer: Medicare HMO | Admitting: Neurology

## 2015-05-08 ENCOUNTER — Ambulatory Visit (HOSPITAL_COMMUNITY)
Admission: RE | Admit: 2015-05-08 | Discharge: 2015-05-08 | Disposition: A | Discharge status: Home / Self Care | Payer: Medicare HMO | Source: Ambulatory Visit | Attending: Cardiology | Admitting: Cardiology

## 2015-05-08 ENCOUNTER — Encounter: Payer: Self-pay | Admitting: Neurology

## 2015-05-08 VITALS — BP 116/68 | HR 72 | Resp 16 | Ht 63.5 in | Wt 187.0 lb

## 2015-05-08 DIAGNOSIS — E785 Hyperlipidemia, unspecified: Secondary | ICD-10-CM | POA: Insufficient documentation

## 2015-05-08 DIAGNOSIS — G4710 Hypersomnia, unspecified: Secondary | ICD-10-CM

## 2015-05-08 DIAGNOSIS — G2581 Restless legs syndrome: Secondary | ICD-10-CM

## 2015-05-08 DIAGNOSIS — R42 Dizziness and giddiness: Secondary | ICD-10-CM | POA: Diagnosis not present

## 2015-05-08 DIAGNOSIS — I340 Nonrheumatic mitral (valve) insufficiency: Secondary | ICD-10-CM | POA: Diagnosis not present

## 2015-05-08 DIAGNOSIS — Z8249 Family history of ischemic heart disease and other diseases of the circulatory system: Secondary | ICD-10-CM | POA: Diagnosis not present

## 2015-05-08 DIAGNOSIS — E119 Type 2 diabetes mellitus without complications: Secondary | ICD-10-CM | POA: Diagnosis not present

## 2015-05-08 DIAGNOSIS — G4733 Obstructive sleep apnea (adult) (pediatric): Secondary | ICD-10-CM

## 2015-05-08 DIAGNOSIS — G43009 Migraine without aura, not intractable, without status migrainosus: Secondary | ICD-10-CM | POA: Diagnosis not present

## 2015-05-08 DIAGNOSIS — G450 Vertebro-basilar artery syndrome: Secondary | ICD-10-CM | POA: Diagnosis not present

## 2015-05-08 DIAGNOSIS — G459 Transient cerebral ischemic attack, unspecified: Secondary | ICD-10-CM | POA: Diagnosis present

## 2015-05-08 DIAGNOSIS — I951 Orthostatic hypotension: Secondary | ICD-10-CM

## 2015-05-08 NOTE — Patient Instructions (Addendum)
Based on your symptoms and your exam I believe you may still be at risk for obstructive sleep apnea or OSA, and I think we should proceed with a sleep study to determine whether you do or do not have OSA and how severe it is. If you have more than mild OSA, I want you to consider treatment with CPAP. Please remember, the risks and ramifications of moderate to severe obstructive sleep apnea or OSA are: Cardiovascular disease, including congestive heart failure, stroke, difficult to control hypertension, arrhythmias, and even type 2 diabetes has been linked to untreated OSA. Sleep apnea causes disruption of sleep and sleep deprivation in most cases, which, in turn, can cause recurrent headaches, problems with memory, mood, concentration, focus, and vigilance. Most people with untreated sleep apnea report excessive daytime sleepiness, which can affect their ability to drive. Please do not drive if you feel sleepy.   I will likely see you back after your sleep study to go over the test results and where to go from there. We will call you after your sleep study to advise about the results (most likely, you will hear from Diana, my nurse) and to set up an appointment at the time, as necessary.    Our sleep lab administrative assistant, Dawn will meet with you or call you to schedule your sleep study. If you don't hear back from her by next week please feel free to call her at [phone]. This is her direct line and please leave a message with your phone number to call back if you get the voicemail box. She will call back as soon as possible.   

## 2015-05-08 NOTE — Progress Notes (Signed)
Subjective:    Patient ID: Tiffany Gibbs is a 70 y.o. female.  HPI     Star Age, MD, PhD [facility] [address], [address] [place], [place] [address]  Dear Bonnita Levan,   I saw your patient, Tiffany Gibbs, upon your kind request in my clinic today for initial consultation of her sleep disorder, in particular, reevaluation of her prior diagnosis of OSA. The patient is unaccompanied today. As you know, Tiffany Gibbs is a 70 year old right-handed woman with an underlying medical history of anxiety, depression, hypertension, cataracts, osteoarthritis, type 2 diabetes, low back pain, osteoporosis, hyperlipidemia, fibromyalgia, status post right total hip arthroplasty, abdominal hysterectomy, and obesity, who was previously diagnosed with obstructive sleep apnea many years ago, over 10 years ago. She had a sleep study at [facility] on 03/22/2001 which was interpreted by Dr. Danton Sewer. I reviewed the test results: Overall AHI was 68 per hour, REM sleep was not achieved. Lowest oxygen saturation was 87%. She had a CPAP titration study on 06/02/2001 also with on hospital. CPAP was titrated to 10 cm. He has seen her for recurrent headaches and dizziness. I reviewed your office note from 04/30/2015. She has several tests pending including brain MRI without contrast, brain MRA, neck MRA with and without contrast, echocardiogram. She has been on autoPAP therapy. It started bothering her. She has also lost weight over time. She was 230 pounds in March 2003, current weight is 187 pounds. She has essentially stopped using her AutoPap machine in early February 2015. She was intermittently compliant with it before. I reviewed compliance data from 01/20/2013 through 02/18/2013. She was poorly compliant at the time. She felt that the mask was bothersome and she did not necessarily feel improved in her sleep. She was having very vivid dreams at times. She has gained some weight back.  She reports restless leg symptoms which are sometimes quite bothersome to her. Stretching helps. She also changed her bed and mattress and that helped as well. Her Epworth sleepiness score is 14 out of 24 today, her fatigue score is 35 out of 63. She denies morning headaches or nocturia. She and her husband sleep in separate beds in the same room. She has 2 dogs who don't bother her at night. She has 1 grown son who lives in [place]. Her bedtime is usually between 10:30 and 11 PM. Wakeup time is around 6:30 on most mornings without alarm. She is not sure if she has a family history of obstructive sleep apnea, mother died at 11 with a PE, father died at 31 with a history of MS and after her heart attack. She had a tonsillectomy as a child. She quit smoking in 85, does not drink alcohol and drinks usually 1 cup of coffee in the morning, occasional sodas. She has to take care of her husband who is 14 years older and suffers from dementia and other physical disabilities. She had an echocardiogram earlier this morning, results are pending. She is scheduled for her MRIs. She is also going to start physical therapy in [place].  Her Past Medical History Is Significant For: Past Medical History  Diagnosis Date  . Anxiety   . Depression   . HTN (hypertension)   . Osteoarthritis   . DM type 2 (diabetes mellitus, type 2) ([place])   . Low back pain   . Osteoporosis   . Hyperlipidemia   . Hives     from tomatoes  . Fibromyalgia   .  OSA (obstructive sleep apnea)     cataracts    Her Past Surgical History Is Significant For: Past Surgical History  Procedure Laterality Date  . Total abdominal hysterectomy    . Cervical cryotherapy      cervix  . 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 Lasik Bilateral   . Eye surgery    . Total hip arthroplasty Left 11/28/2012    Procedure: TOTAL HIP ARTHROPLASTY;  Surgeon: Kerin Salen, MD;  Location: [facility] [facility];  Service: Orthopedics;  Laterality: Left;    Her  Family History Is Significant For: Family History  Problem Relation Age of Onset  . Hypertension Mother   . Heart disease Father   . Multiple sclerosis Father   . Heart attack Father     Her Social History Is Significant For: Social History   Social History  . Marital Status: Married    Spouse Name: Sam  . Number of Children: 1  . Years of Education: 12    Occupational History  . N/A    Social History Main Topics  . Smoking status: Former Smoker -- 2.00 packs/day for 25 years    Types: Cigarettes    Quit date: 01/13/1983  . Smokeless tobacco: Never Used  . Alcohol Use: No  . Drug Use: No  . Sexual Activity: Yes   Other Topics Concern  . None   Social History Narrative   Was divorced - newly re-married as of 2009, Sam   1 son      Caffeine use- coffee 1 cup daily        Her Allergies Are:  Allergies  Allergen Reactions  . Celecoxib     swell  . Enalapril     cough  . Losartan     falls  . Lovastatin     REACTION: aches  . Naproxen   :   Her Current Medications Are:  Outpatient Encounter Prescriptions as of 05/08/2015  Medication Sig  . aspirin EC 81 MG tablet Take 1 tablet (81 mg total) by mouth daily.  . Cholecalciferol (VITAMIN D) 1000 UNITS capsule Take 1 capsule (1,000 Units total) by mouth daily.  . ciclopirox (PENLAC) 8 % solution Apply topically at bedtime. Apply over nail and surrounding skin. Apply daily over previous coat. After seven (7) days, may remove with alcohol and continue cycle.  . citalopram (CELEXA) 40 MG tablet Take 1 tablet (40 mg total) by mouth daily.  . clotrimazole-betamethasone (LOTRISONE) cream Apply 1 application topically 2 (two) times daily.  . Cyanocobalamin (VITAMIN B-12) 1000 MCG SUBL Place 1 tablet (1,000 mcg total) under the tongue daily.  . diphenhydrAMINE (BENADRYL) 25 MG tablet Take 25 mg by mouth at bedtime.  . fluticasone (FLONASE) 50 MCG/ACT nasal spray Place 1 spray into both nostrils daily as needed for  allergies or rhinitis.  . furosemide (LASIX) 40 MG tablet Take 1 tablet (40 mg total) by mouth daily.  [name] ibuprofen (ADVIL,MOTRIN) 600 MG tablet Take 1 tablet (600 mg total) by mouth 2 (two) times daily as needed.  . loratadine (CLARITIN) 10 MG tablet Take 10 mg by mouth daily.  . metFORMIN (GLUCOPHAGE) 500 MG tablet Take 1 tablet (500 mg total) by mouth 2 (two) times daily with a meal.  . Omega-3 Fatty Acids (FISH OIL) 1200 MG CAPS Take 2,400 mg by mouth 2 (two) times daily.  [name] triamcinolone cream (KENALOG) 0.5 % Apply 1 application topically 3 (three) times daily as needed (  to rash).   No facility-administered encounter medications on file as of 05/08/2015.  :  Review of Systems:  Out of a complete 14 point review of systems, all are reviewed and negative with the exception of these symptoms as listed below:   Review of Systems  Neurological:       Had sleep study about 10 years ago. Prescribed CPAP. Patient has not used in 2 years. States she lost weight since sleep study.  Has some trouble falling asleep, just bought a new mattress and falls asleep quicker, wakes up a couple of times during the night, snoring, witnessed apnea, wakes up feeling tired, headaches/neck pain    Epworth Sleepiness Scale 0= would never doze 1= slight chance of dozing 2= moderate chance of dozing 3= high chance of dozing  Sitting and reading:3 Watching TV:2 Sitting inactive in a public place (ex. Theater or meeting):2 As a passenger in a car for an hour without a break:3 Lying down to rest in the afternoon:3 Sitting and talking to someone:0 Sitting quietly after lunch (no alcohol):1 In a car, while stopped in traffic:0 Total:14   Objective:  Neurologic Exam  Physical Exam Physical Examination:   Filed Vitals:   05/08/15 1058  BP: 116/68  Pulse: 72  Resp: 16    General Examination: The patient is a very pleasant 70 y.o. female in no acute distress. She appears well-developed and  well-nourished and well groomed.   HEENT: Normocephalic, atraumatic, pupils are equal, round and reactive to light and accommodation. Funduscopic exam is normal with sharp disc margins noted. Extraocular tracking is good without limitation to gaze excursion or nystagmus noted. Normal smooth pursuit is noted. Hearing is grossly intact. Tympanic membranes are clear bilaterally. Face is symmetric with normal facial animation and normal facial sensation. Speech is clear with no dysarthria noted. There is no hypophonia. There is no lip, neck/head, jaw or voice tremor. Neck is supple with full range of passive and active motion. There are no carotid bruits on auscultation. Oropharynx exam reveals: moderate mouth dryness, adequate dental hygiene with caps on all teeth, except for one tooth. She has mild airway crowding secondary to smaller airway entry and redundant soft palate. Tonsils are absent.  Chest: Clear to auscultation without wheezing, rhonchi or crackles noted.  Heart: S1+S2+0, regular and normal without murmurs, rubs or gallops noted.   Abdomen: Soft, non-tender and non-distended with normal bowel sounds appreciated on auscultation.  Extremities: There is no pitting edema in the distal lower extremities bilaterally. Pedal pulses are intact.  Skin: Warm and dry without trophic changes noted. There are Varicose veins in the left distal leg, minimal in the right distal leg. She has a bruise in the medial left thigh.she says she fell in the yard.  Musculoskeletal: exam reveals no obvious joint deformities, tenderness or joint swelling or erythema.   Neurologically:  Mental status: The patient is awake, alert and oriented in all 4 spheres. Her immediate and remote memory, attention, language skills and fund of knowledge are appropriate. There is no evidence of aphasia, agnosia, apraxia or anomia. Speech is clear with normal prosody and enunciation. Thought process is linear. Mood is normal and  affect is normal.  Cranial nerves II - XII are as described above under HEENT exam. In addition: shoulder shrug is normal with equal shoulder height noted. Motor exam: Normal bulk, strength and tone is noted. There is no drift, tremor or rebound. Romberg is negative, but she leans to the left. Reflexes are 1+  throughout. Babinski: Toes are flexor bilaterally. Fine motor skills and coordination: intact with normal finger taps, normal hand movements, normal rapid alternating patting, normal foot taps and normal foot agility.  Cerebellar testing: No dysmetria or intention tremor on finger to nose testing. Heel to shin is unremarkable bilaterally. There is no truncal or gait ataxia.  Sensory exam: intact to light touch in the upper and lower extremities but does report some tingling in her toes at times.  Gait, station and balance: She stands easily. No veering to one side is noted. No leaning to one side is noted. Posture is age-appropriate and stance is narrow based. Gait shows normal stride length and normal pace. No problems turning are noted. She turns en bloc. Tandem walk is mildly difficult for her and she has a tendency to veer to the left.               Assessment and Plan:   In summary, AMEELIA STAMPONE is a very pleasant 70 y.o.-year old female with an underlying medical history of anxiety, depression, hypertension, cataracts, osteoarthritis, type 2 diabetes, low back pain, osteoporosis, hyperlipidemia, fibromyalgia, status post right total hip arthroplasty, abdominal hysterectomy, and obesity, who was previously diagnosed with severe obstructive sleep apnea and placed on AutoPap therapy which she eventually stopped a little over 2 years ago. It started bothering her and she did not feel improved. She had interim weight loss but still is overweight, and a mildly obese range. It is difficult to say if she still has residual sleep apnea. She does not always sleep very well. She endorses restless leg  symptoms as well as daytime somnolence.  I had a long chat with the patient about my findings and the diagnosis of OSA, its prognosis and treatment options. We talked about medical treatments, surgical interventions and non-pharmacological approaches. I explained in particular the risks and ramifications of untreated moderate to severe OSA, especially with respect to developing cardiovascular disease down the Road, including congestive heart failure, difficult to treat hypertension, cardiac arrhythmias, or stroke. Even type 2 diabetes has, in part, been linked to untreated OSA. Symptoms of untreated OSA include daytime sleepiness, memory problems, mood irritability and mood disorder such as depression and anxiety, lack of energy, as well as recurrent headaches, especially morning headaches. We talked about trying to maintain a healthy lifestyle in general, as well as the importance of weight control. I encouraged the patient to eat healthy, exercise daily and keep well hydrated, to keep a scheduled bedtime and wake time routine, to not skip any meals and eat healthy snacks in between meals. I advised the patient not to drive when feeling sleepy. I recommended the following at this time: sleep study with potential positive airway pressure titration. I think it is worth her while to get reevaluation done with proper sleep study. She is a little apprehensive about leaving her husband alone overnight. (We will score hypopneas at 4% and split the sleep study into diagnostic and treatment portion, if the estimated. 2 hour AHI is >15/h). She will discuss the plan with her husband as well. We will also be on the lookout for PLMS as they are closely associated with restless leg symptoms. I explained the sleep test procedure to the patient and also outlined possible surgical and non-surgical treatment options of OSA, including the use of a custom-made dental device (which would require a referral to a specialist dentist  or oral surgeon), upper airway surgical options, such as pillar implants, radiofrequency surgery,  tongue base surgery, and UPPP (which would involve a referral to an ENT surgeon). Rarely, jaw surgery such as mandibular advancement may be considered.  I also explained the CPAP treatment option to the patient, who indicated that she would be willing to try CPAP if the need arises. I explained the importance of being compliant with PAP treatment, not only for insurance purposes but primarily to improve Her symptoms, and for the patient's long term health benefit, including to reduce Her cardiovascular risks. I answered all her questions today and the patient was in agreement. I would like to see her back after the sleep study is completed and encouraged her to call with any interim questions, concerns, problems or updates.   Thank you very much for allowing me to participate in the care of this nice patient. If I can be of any further assistance to you please do not hesitate to call talk to me.   Sincerely,   Star Age, MD, PhD

## 2015-05-09 ENCOUNTER — Other Ambulatory Visit: Payer: Self-pay | Admitting: Diagnostic Neuroimaging

## 2015-05-13 ENCOUNTER — Telehealth: Payer: Self-pay | Admitting: Internal Medicine

## 2015-05-13 ENCOUNTER — Ambulatory Visit
Admission: RE | Admit: 2015-05-13 | Discharge: 2015-05-13 | Disposition: A | Discharge status: Home / Self Care | Payer: Medicare HMO | Source: Ambulatory Visit | Attending: Diagnostic Neuroimaging | Admitting: Diagnostic Neuroimaging

## 2015-05-13 DIAGNOSIS — I951 Orthostatic hypotension: Secondary | ICD-10-CM | POA: Diagnosis not present

## 2015-05-13 DIAGNOSIS — G450 Vertebro-basilar artery syndrome: Secondary | ICD-10-CM

## 2015-05-13 DIAGNOSIS — R42 Dizziness and giddiness: Secondary | ICD-10-CM

## 2015-05-13 DIAGNOSIS — R69 Illness, unspecified: Secondary | ICD-10-CM | POA: Diagnosis not present

## 2015-05-13 DIAGNOSIS — G43009 Migraine without aura, not intractable, without status migrainosus: Secondary | ICD-10-CM

## 2015-05-13 MED ORDER — GADOBENATE DIMEGLUMINE 529 MG/ML IV SOLN
17.0000 mL | Freq: Once | INTRAVENOUS | Status: AC | PRN
  Administered 2015-05-13: 17 mL via INTRAVENOUS

## 2015-05-13 NOTE — Telephone Encounter (Signed)
Patient called to get lab results from 04/25/2015 mailed to her. She states that no one ever called or mailed them. There are no notes placed as far as an interpretation for the labs. Please interpret and mail results to the patient per her request

## 2015-05-14 NOTE — Telephone Encounter (Signed)
Labs are ok except for a little elevated lipids. Wt loss should help to improve lipids Thx

## 2015-05-15 NOTE — Telephone Encounter (Signed)
Thanks stacey

## 2015-05-16 ENCOUNTER — Telehealth: Payer: Self-pay | Admitting: *Deleted

## 2015-05-16 NOTE — Telephone Encounter (Signed)
Spoke with patient and informed her, per Dr Leta Baptist, her MRI showed normal imaging results. Continue current plan. She stated she has not heard back to schedule her sleep study. She did receive a call from someone in this office to inform her that the person was waiting on insurance approval. She stated that was about 2 weeks ago. Informed her that this RN will route to office staff to follow up on her sleep study. She verbalized understanding, appreciation.

## 2015-05-16 NOTE — Telephone Encounter (Signed)
Pt informed. Copy of 04/25/15 labs mailed to pt.

## 2015-05-16 NOTE — Telephone Encounter (Signed)
This should go to [facility] and I will forward to her.

## 2015-05-29 ENCOUNTER — Ambulatory Visit (INDEPENDENT_AMBULATORY_CARE_PROVIDER_SITE_OTHER): Payer: Medicare HMO | Admitting: Internal Medicine

## 2015-05-29 ENCOUNTER — Encounter: Payer: Self-pay | Admitting: Internal Medicine

## 2015-05-29 VITALS — BP 150/90 | HR 65 | Temp 98.6°F | Resp 18 | Ht 63.5 in | Wt 184.0 lb

## 2015-05-29 DIAGNOSIS — Z1211 Encounter for screening for malignant neoplasm of colon: Secondary | ICD-10-CM

## 2015-05-29 DIAGNOSIS — E669 Obesity, unspecified: Secondary | ICD-10-CM

## 2015-05-29 DIAGNOSIS — M545 Low back pain, unspecified: Secondary | ICD-10-CM

## 2015-05-29 DIAGNOSIS — I10 Essential (primary) hypertension: Secondary | ICD-10-CM | POA: Diagnosis not present

## 2015-05-29 DIAGNOSIS — E119 Type 2 diabetes mellitus without complications: Secondary | ICD-10-CM

## 2015-05-29 DIAGNOSIS — G4733 Obstructive sleep apnea (adult) (pediatric): Secondary | ICD-10-CM | POA: Diagnosis not present

## 2015-05-29 NOTE — Patient Instructions (Addendum)
[employer]

## 2015-05-29 NOTE — Assessment & Plan Note (Signed)
BP Readings from Last 3 Encounters:  05/29/15 150/90  05/08/15 116/68  04/17/15 118/78  BP OK at home

## 2015-05-29 NOTE — Progress Notes (Signed)
Pre visit review using our clinic review tool, if applicable. No additional management support is needed unless otherwise documented below in the visit note. 

## 2015-05-29 NOTE — Assessment & Plan Note (Signed)
On CPAP. ?

## 2015-05-29 NOTE — Assessment & Plan Note (Signed)
[employer] recomended

## 2015-05-29 NOTE — Assessment & Plan Note (Signed)
Steroid inj helped

## 2015-05-29 NOTE — Progress Notes (Signed)
Subjective:  Patient ID: Tiffany Gibbs, female    DOB: Jan 09, 1946  Age: 70 y.o. MRN: [id number]  CC: No chief complaint on file.   HPI Tiffany Gibbs presents for dizziness - better; R hip/buttock pain - much better after a shot; OSA f/u. On CPAP now.  Outpatient Prescriptions Prior to Visit  Medication Sig Dispense Refill  . aspirin EC 81 MG tablet Take 1 tablet (81 mg total) by mouth daily. 100 tablet 3  . Cholecalciferol (VITAMIN D) 1000 UNITS capsule Take 1 capsule (1,000 Units total) by mouth daily. 100 capsule 3  . ciclopirox (PENLAC) 8 % solution Apply topically at bedtime. Apply over nail and surrounding skin. Apply daily over previous coat. After seven (7) days, may remove with alcohol and continue cycle. 6.6 mL 3  . citalopram (CELEXA) 40 MG tablet Take 1 tablet (40 mg total) by mouth daily. 90 tablet 3  . clotrimazole-betamethasone (LOTRISONE) cream Apply 1 application topically 2 (two) times daily. 45 g 1  . Cyanocobalamin (VITAMIN B-12) 1000 MCG SUBL Place 1 tablet (1,000 mcg total) under the tongue daily. 100 tablet 3  . diphenhydrAMINE (BENADRYL) 25 MG tablet Take 25 mg by mouth at bedtime.    . fluticasone (FLONASE) 50 MCG/ACT nasal spray Place 1 spray into both nostrils daily as needed for allergies or rhinitis.    . furosemide (LASIX) 40 MG tablet Take 1 tablet (40 mg total) by mouth daily. 90 tablet 3  . ibuprofen (ADVIL,MOTRIN) 600 MG tablet Take 1 tablet (600 mg total) by mouth 2 (two) times daily as needed. 60 tablet 3  . loratadine (CLARITIN) 10 MG tablet Take 10 mg by mouth daily.    . metFORMIN (GLUCOPHAGE) 500 MG tablet Take 1 tablet (500 mg total) by mouth 2 (two) times daily with a meal. 180 tablet 3  . Omega-3 Fatty Acids (FISH OIL) 1200 MG CAPS Take 2,400 mg by mouth 2 (two) times daily.    [name] triamcinolone cream (KENALOG) 0.5 % Apply 1 application topically 3 (three) times daily as needed (to rash).     No facility-administered medications prior to visit.     ROS Review of Systems  Constitutional: Negative for chills, activity change, appetite change, fatigue and unexpected weight change.  HENT: Negative for congestion, mouth sores and sinus pressure.   Eyes: Negative for visual disturbance.  Respiratory: Negative for cough and chest tightness.   Gastrointestinal: Negative for nausea and abdominal pain.  Genitourinary: Negative for frequency, difficulty urinating and vaginal pain.  Musculoskeletal: Positive for arthralgias. Negative for back pain and gait problem.  Skin: Negative for pallor and rash.  Neurological: Negative for dizziness, tremors, seizures, weakness, numbness and headaches.  Psychiatric/Behavioral: Negative for suicidal ideas, confusion and sleep disturbance.    Objective:  BP 150/90 mmHg  Pulse 65  Temp(Src) 98.6 F (37 C) (Oral)  Resp 18  Ht 5' 3.5" (1.613 m)  Wt 184 lb (83.462 kg)  BMI 32.08 kg/m2  SpO2 98%  BP Readings from Last 3 Encounters:  05/29/15 150/90  05/08/15 116/68  04/17/15 118/78    Wt Readings from Last 3 Encounters:  05/29/15 184 lb (83.462 kg)  05/13/15 184 lb (83.462 kg)  05/13/15 184 lb (83.462 kg)    Physical Exam  Constitutional: She appears well-developed. No distress.  HENT:  Head: Normocephalic.  Right Ear: External ear normal.  Left Ear: External ear normal.  Nose: Nose normal.  Mouth/Throat: Oropharynx is clear and moist.  Eyes: Conjunctivae are normal.  Pupils are equal, round, and reactive to light. Right eye exhibits no discharge. Left eye exhibits no discharge.  Neck: Normal range of motion. Neck supple. No JVD present. No tracheal deviation present. No thyromegaly present.  Cardiovascular: Normal rate, regular rhythm and normal heart sounds.   Pulmonary/Chest: No stridor. No respiratory distress. She has no wheezes.  Abdominal: Soft. Bowel sounds are normal. She exhibits no distension and no mass. There is no tenderness. There is no rebound and no guarding.   Musculoskeletal: She exhibits no edema or tenderness.  Lymphadenopathy:    She has no cervical adenopathy.  Neurological: She displays normal reflexes. No cranial nerve deficit. She exhibits normal muscle tone. Coordination normal.  Skin: No rash noted. No erythema.  Psychiatric: She has a normal mood and affect. Her behavior is normal. Judgment and thought content normal.  obese   Lab Results  Component Value Date   WBC 7.3 04/25/2015   HGB 13.6 04/25/2015   HCT 39.8 04/25/2015   PLT 241.0 04/25/2015   GLUCOSE 96 11/19/2014   CHOL 217* 04/25/2015   TRIG 105.0 04/25/2015   HDL 50.60 04/25/2015   LDLDIRECT 148.2 02/03/2013   LDLCALC 146* 04/25/2015   ALT 17 11/19/2014   AST 18 11/19/2014   NA 139 11/19/2014   K 3.6 11/19/2014   CL 99 11/19/2014   CREATININE 1.07 11/19/2014   BUN 17 11/19/2014   CO2 29 11/19/2014   TSH 2.01 11/19/2014   INR 1.14 11/21/2012   HGBA1C 5.7 11/19/2014    Mr Tiffany Gibbs Head Wo Contrast  05/15/2015  [facility] NEUROIMAGING REPORT STUDY DATE: 05/13/15 PATIENT NAME: Tiffany Gibbs DOB: 27-Sep-1945 MRN: [id number] ORDERING CLINICIAN: Andrey Spearman, MD CLINICAL HISTORY: 70 year old female with dizziness. EXAM: MRA head (without) TECHNIQUE: MR angiogram of the head was obtained utilizing 3D time of flight sequences from below the vertebrobasilar junction up to the intracranial vasculature without contrast.  Computerized reconstructions were obtained. CONTRAST: no IMAGING SITE: [facility] [address] (1.5 Tesla MRI)  FINDINGS: This study is of adequate technical quality. Flow signal of the bilateral internal carotid arteries have no stenosis. The bilateral middle and anterior cerebral arteries have no stenosis. The bilateral vertebral, basilar, bilateral posterior cerebral arteries have no stenosis. No aneurysmal dilatations are seen.   05/15/2015  Normal MRA head (without). INTERPRETING PHYSICIAN: Tiffany Bombard, MD Certified  in Neurology, Neurophysiology and Neuroimaging [facility] [address], [place], [place] [address] [phone]   Mr Angiogram Neck W [place] Contrast  05/15/2015  [facility] NEUROIMAGING REPORT STUDY DATE: 05/13/15 PATIENT NAME: DEBBE AFABLE DOB: 11/26/1945 MRN: [id number] ORDERING CLINICIAN: Andrey Spearman, MD CLINICAL HISTORY: 70 year old female with dizziness. EXAM: MRA neck (with and without) TECHNIQUE: MR angiogram of the neck was obtained utilizing 3D time-of-flight sequences from the aortic arch up to the intracranial vasculature postbolus contrast infusion.  2D time-of-flight noncontrast views and computerized reconstructions were obtained. CONTRAST: 58ml multihance IMAGING SITE: [facility] [address] (1.5 Tesla MRI)  FINDINGS: This study is of adequate technical quality.  There is anterograde flow in the bilateral vertebral and carotid arteries on 2D-TOF views.  The flow signal of the left subclavian artery has no stenosis.  The left common, internal and external carotid arteries have no stenosis.  The left vertebral artery has no stenosis from its origin up to the vertebrobasilar junction. There is subtle, focal signal decline in the the proximal left vertebral artery  on 3D reconstructions but not on source images, and therefore likely artifactual. In addition, there is normal appearing flow through the left vertebral artery superiorly up to the vertebro-basilar junction. On the right brachiocephalic trunk and subclavian arteries have no stenosis. The right common, internal and external carotid arteries have no stenosis. The right vertebral artery has no stenosis from its origin to the vertebrobasilar junction. Limited views of the intracranial vasculature are unremarkable.   05/15/2015  Normal MRA neck (with and without). INTERPRETING PHYSICIAN: Tiffany Bombard, MD Certified in Neurology, Neurophysiology and Neuroimaging [facility] [address], [place], [place] [address] [phone]   Mr Brain 94 Contrast  05/15/2015  [facility] NEUROIMAGING REPORT STUDY DATE: 05/13/15 PATIENT NAME: DRAKE CRANMER DOB: [birth year] MRN: [id number] ORDERING CLINICIAN: Andrey Spearman, MD CLINICAL HISTORY: 70 year old female with dizziness. EXAM: MRI brain (without) TECHNIQUE: MRI of the brain without contrast was obtained utilizing 5 mm axial slices with T1, T2, T2 flair, SWI and diffusion weighted views.  T1 sagittal and T2 coronal views were obtained. CONTRAST: no IMAGING SITE: [facility] [address] (1.5 Tesla MRI)  FINDINGS: No abnormal lesions are seen on diffusion-weighted views to suggest acute ischemia. The cortical sulci, fissures and cisterns are normal in size and appearance. Lateral, third and fourth ventricle are normal in size and appearance. No extra-axial fluid collections are seen. No evidence of mass effect or midline shift.  On sagittal views the posterior fossa, pituitary gland and corpus callosum are unremarkable. No evidence of intracranial hemorrhage on SWI views. The orbits and their contents, paranasal sinuses and calvarium are notable for right maxillary mucus retention cyst. Intracranial flow voids are present.   05/15/2015  Normal MRI brain (without). INTERPRETING PHYSICIAN: Tiffany Bombard, MD Certified in Neurology, Neurophysiology and Neuroimaging [facility] [address] [place], [place] [address] [phone]    Assessment & Plan:   There are no diagnoses linked to this encounter. I am having Ms. Muldoon maintain her Vitamin D, Fish Oil, diphenhydrAMINE, loratadine, fluticasone, triamcinolone cream, ciclopirox, aspirin EC, clotrimazole-betamethasone, furosemide, metFORMIN, citalopram, Vitamin B-12, and ibuprofen.  No orders of the defined types were placed in this encounter.     Follow-up: No Follow-up on file.  Walker Kehr, MD

## 2015-05-29 NOTE — Assessment & Plan Note (Signed)
On Metformin 

## 2015-06-04 ENCOUNTER — Encounter: Payer: Self-pay | Admitting: Internal Medicine

## 2015-06-11 ENCOUNTER — Ambulatory Visit: Admitting: Diagnostic Neuroimaging

## 2015-06-12 ENCOUNTER — Ambulatory Visit (INDEPENDENT_AMBULATORY_CARE_PROVIDER_SITE_OTHER): Payer: Medicare HMO | Admitting: Diagnostic Neuroimaging

## 2015-06-12 ENCOUNTER — Encounter: Payer: Self-pay | Admitting: Diagnostic Neuroimaging

## 2015-06-12 VITALS — BP 108/62 | HR 66 | Ht 63.5 in | Wt 184.2 lb

## 2015-06-12 DIAGNOSIS — R42 Dizziness and giddiness: Secondary | ICD-10-CM

## 2015-06-12 NOTE — Progress Notes (Signed)
[facility]  PATIENT: Tiffany Gibbs DOB: 1945-10-21  REFERRING CLINICIAN: Owens Shark  HISTORY FROM: patient and (EPIC chart review and referring MD notes review and summarized in note below) REASON FOR VISIT: follow up   HISTORICAL  CHIEF COMPLAINT:  Chief Complaint  Patient presents with  . Dizziness    rm 6, "review MRI results; sleep study not done yet"  . Follow-up    6 week    HISTORY OF PRESENT ILLNESS:   UPDATE 06/12/15: Since last visit, headaches are improved. Dizzy spells are faint and rare (~1 per week, 1 second). Meds are being adjusted. Now back on CPAP x few weeks.   PRIOR HPI (04/30/15): 70 year old right-handed female here for evaluation of headaches and dizziness. Patient has history of migraine headaches in her 51s with severe global headaches with photophobia. No nausea vomiting. Headaches seem to resolve by the time she was 70 years old. Since February 2017 patient has had intermittent lightheaded sensations, room spinning sensations, dizzy spells lasting a few seconds at a time. Symptoms would occur a few times per week. Positional changes would seem to trigger headaches such as turning, bending, sitting up or standing up. No nausea or vomiting. No passing out, slurred speech, trouble talking, double vision. Patient would have some balance difficulties and feeling like she could fall down and have to hold onto something. In April 2017 patient was at [employer] center and had a severe episode that scared her. In addition since November 2016 patient has been having increasing increasing right-sided headaches, right-sided neck pain, right occipital pain with pounding sensation lasting for 2 hours. No nausea or vomiting. No photophobia or phonophobia. Patient having at least 2 headaches per month.    REVIEW OF SYSTEMS: Full 14 system review of systems performed and negative with exception of: Decreased energy dizziness restless leg snoring numbness  headache memory loss easy bruising feeling hot feeling cold joint pains shortness of breath fatigue swelling in legs itching ringing in ears spinning sensation.   ALLERGIES: Allergies  Allergen Reactions  . Celecoxib     swell  . Enalapril     cough  . Losartan     falls  . Lovastatin     REACTION: aches  . Naproxen     HOME MEDICATIONS: Outpatient Prescriptions Prior to Visit  Medication Sig Dispense Refill  . aspirin EC 81 MG tablet Take 1 tablet (81 mg total) by mouth daily. 100 tablet 3  . Cholecalciferol (VITAMIN D) 1000 UNITS capsule Take 1 capsule (1,000 Units total) by mouth daily. 100 capsule 3  . ciclopirox (PENLAC) 8 % solution Apply topically at bedtime. Apply over nail and surrounding skin. Apply daily over previous coat. After seven (7) days, may remove with alcohol and continue cycle. 6.6 mL 3  . citalopram (CELEXA) 40 MG tablet Take 1 tablet (40 mg total) by mouth daily. 90 tablet 3  . clotrimazole-betamethasone (LOTRISONE) cream Apply 1 application topically 2 (two) times daily. 45 g 1  . Cyanocobalamin (VITAMIN B-12) 1000 MCG SUBL Place 1 tablet (1,000 mcg total) under the tongue daily. 100 tablet 3  . diphenhydrAMINE (BENADRYL) 25 MG tablet Take 25 mg by mouth at bedtime.    . fluticasone (FLONASE) 50 MCG/ACT nasal spray Place 1 spray into both nostrils daily as needed for allergies or rhinitis.    . furosemide (LASIX) 40 MG tablet Take 1 tablet (40 mg total) by mouth daily. 90 tablet 3  . ibuprofen (ADVIL,MOTRIN) 600 MG tablet  Take 1 tablet (600 mg total) by mouth 2 (two) times daily as needed. 60 tablet 3  . loratadine (CLARITIN) 10 MG tablet Take 10 mg by mouth daily.    . metFORMIN (GLUCOPHAGE) 500 MG tablet Take 1 tablet (500 mg total) by mouth 2 (two) times daily with a meal. 180 tablet 3  . Omega-3 Fatty Acids (FISH OIL) 1200 MG CAPS Take 2,400 mg by mouth 2 (two) times daily.    [name] triamcinolone cream (KENALOG) 0.5 % Apply 1 application topically 3 (three)  times daily as needed (to rash).     No facility-administered medications prior to visit.    PAST MEDICAL HISTORY: Past Medical History  Diagnosis Date  . Anxiety   . Depression   . HTN (hypertension)   . Osteoarthritis   . DM type 2 (diabetes mellitus, type 2) ([place])   . Low back pain   . Osteoporosis   . Hyperlipidemia   . Hives     from tomatoes  . Fibromyalgia   . OSA (obstructive sleep apnea)     cataracts    PAST SURGICAL HISTORY: Past Surgical History  Procedure Laterality Date  . Total abdominal hysterectomy    . Cervical cryotherapy      cervix  . 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 Lasik Bilateral   . Eye surgery    . Total hip arthroplasty Left 11/28/2012    Procedure: TOTAL HIP ARTHROPLASTY;  Surgeon: Kerin Salen, MD;  Location: [place];  Service: Orthopedics;  Laterality: Left;    FAMILY HISTORY: Family History  Problem Relation Age of Onset  . Hypertension Mother   . Heart disease Father   . Multiple sclerosis Father   . Heart attack Father     SOCIAL HISTORY:  Social History   Social History  . Marital Status: Married    Spouse Name: Sam  . Number of Children: 1  . Years of Education: 12    Occupational History  . N/A    Social History Main Topics  . Smoking status: Former Smoker -- 2.00 packs/day for 25 years    Types: Cigarettes    Quit date: 01/13/1983  . Smokeless tobacco: Never Used  . Alcohol Use: No  . Drug Use: No  . Sexual Activity: Yes   Other Topics Concern  . Not on file   Social History Narrative   Was divorced - newly re-married as of 2009, Sam   1 son      Caffeine use- coffee 1 cup daily         PHYSICAL EXAM  GENERAL EXAM/CONSTITUTIONAL: Vitals:  Filed Vitals:   06/12/15 1427  BP: 108/62  Pulse: 66  Height: 5' 3.5" (1.613 m)  Weight: 184 lb 3.2 oz (83.553 kg)   No data found.  Body mass index is 32.11 kg/(m^2). No exam data present  Patient is in no distress; well developed,  nourished and groomed; neck is supple  CARDIOVASCULAR:  Examination of carotid arteries is normal; no carotid bruits  Regular rate and rhythm, no murmurs  Examination of peripheral vascular system by observation and palpation is normal  EYES:  Ophthalmoscopic exam of optic discs and posterior segments is normal; no papilledema or hemorrhages  MUSCULOSKELETAL:  Gait, strength, tone, movements noted in Neurologic exam below  NEUROLOGIC: MENTAL STATUS:  MMSE - Mini Mental State Exam 11/19/2014  Not completed: (No Data)    awake, alert, oriented to person, place and time  recent and remote memory intact  normal attention and concentration  language fluent, comprehension intact, naming intact,   fund of knowledge appropriate  CRANIAL NERVE:   2nd - no papilledema on fundoscopic exam  2nd, 3rd, 4th, 6th - pupils equal and reactive to light, visual fields full to confrontation, extraocular muscles intact, no nystagmus  5th - facial sensation symmetric  7th - facial strength symmetric  8th - hearing intact  9th - palate elevates symmetrically, uvula midline  11th - shoulder shrug symmetric  12th - tongue protrusion midline  MOTOR:   normal bulk and tone, full strength in the BUE, BLE  SENSORY:   normal and symmetric to light touch, temperature, vibration; DECR VIB AT TOES (5 SEC)  COORDINATION:   finger-nose-finger, fine finger movements normal  REFLEXES:   deep tendon reflexes present and symmetric; TRACE AT ANKLES  GAIT/STATION:   narrow based gait; able to walk tandem; ROMBERG --> EYES CLOSED LEANS TO LEFT SIDE    DIAGNOSTIC DATA (LABS, IMAGING, TESTING) - I reviewed patient records, labs, notes, testing and imaging myself where available.  Lab Results  Component Value Date   WBC 7.3 04/25/2015   HGB 13.6 04/25/2015   HCT 39.8 04/25/2015   MCV 87.0 04/25/2015   PLT 241.0 04/25/2015      Component Value Date/Time   NA 139 11/19/2014 1347    K 3.6 11/19/2014 1347   CL 99 11/19/2014 1347   CO2 29 11/19/2014 1347   GLUCOSE 96 11/19/2014 1347   BUN 17 11/19/2014 1347   CREATININE 1.07 11/19/2014 1347   CALCIUM 10.1 11/19/2014 1347   PROT 8.0 11/19/2014 1347   ALBUMIN 4.7 11/19/2014 1347   AST 18 11/19/2014 1347   ALT 17 11/19/2014 1347   ALKPHOS 57 11/19/2014 1347   BILITOT 0.7 11/19/2014 1347   GFRNONAA 73* 11/29/2012 0725   GFRAA 85* 11/29/2012 0725   Lab Results  Component Value Date   CHOL 217* 04/25/2015   HDL 50.60 04/25/2015   LDLCALC 146* 04/25/2015   LDLDIRECT 148.2 02/03/2013   TRIG 105.0 04/25/2015   CHOLHDL 4 04/25/2015   Lab Results  Component Value Date   HGBA1C 5.7 11/19/2014   Lab Results  Component Value Date   VITAMINB12 >1500* 11/19/2014   Lab Results  Component Value Date   TSH 2.01 11/19/2014   08/30/04 MRI cervical spine [report only] 1. There is multilevel spondylosis as noted on the patient's prior radiographs, most advanced at C4-5 and C5-6 levels and resulting in biforaminal stenosis at C4-5 and left-sided foraminal narrowing at C5-6.   2. There is no central stenosis or cord deformity.  12/01/12 EKG [I reviewed images myself and agree with interpretation. -VRP]  - normal sinus rhythm  09/26/14 CXR [I reviewed images myself and agree with interpretation. -VRP]  - Stable exam. No active cardiopulmonary disease.    ASSESSMENT AND PLAN   70 y.o. year old female here with New onset dizziness, vertigo, lightheadedness, typically triggered by position change. Also with balance and gait difficulty and headaches. Unclear whether these are unified by single diagnosis represent several different causes.   Symptoms are gradually improving. Patient will follow up as needed with me.    Ddx dizziness: peripheral vestibulopathy vs orthostatic hypotension vs diabetic neuropathy  Ddx headaches: migraine variant vs secondary headache   1. Dizziness and giddiness       PLAN: STROKE PREVENTION - continue aspirin - continue diabetes treatment - monitor BP - LDL 146; goal <  100  DIZZINESS - monitor BP at home - continue chair yoga and water exercises  HEADACHES - monitor symptoms - ibuprofen prn  SLEEP APNEA - check sleep study (last tested / treated ~ 15-20 years ago)  Return if symptoms worsen or fail to improve, for return to PCP.    Penni Bombard, MD [id number], [id number] PM Certified in Neurology, Neurophysiology and Neuroimaging  [facility] [address], [place], [place] [address] [phone]

## 2015-06-25 ENCOUNTER — Ambulatory Visit (INDEPENDENT_AMBULATORY_CARE_PROVIDER_SITE_OTHER): Payer: Medicare HMO | Admitting: Neurology

## 2015-06-25 DIAGNOSIS — G4710 Hypersomnia, unspecified: Secondary | ICD-10-CM

## 2015-06-25 DIAGNOSIS — G4733 Obstructive sleep apnea (adult) (pediatric): Secondary | ICD-10-CM

## 2015-06-25 DIAGNOSIS — G4720 Circadian rhythm sleep disorder, unspecified type: Secondary | ICD-10-CM

## 2015-06-25 DIAGNOSIS — G2581 Restless legs syndrome: Secondary | ICD-10-CM

## 2015-06-25 DIAGNOSIS — G4761 Periodic limb movement disorder: Secondary | ICD-10-CM

## 2015-06-25 DIAGNOSIS — G479 Sleep disorder, unspecified: Secondary | ICD-10-CM

## 2015-07-01 ENCOUNTER — Telehealth: Payer: Self-pay | Admitting: Neurology

## 2015-07-01 DIAGNOSIS — G4761 Periodic limb movement disorder: Secondary | ICD-10-CM

## 2015-07-01 DIAGNOSIS — G4720 Circadian rhythm sleep disorder, unspecified type: Secondary | ICD-10-CM

## 2015-07-01 DIAGNOSIS — G4733 Obstructive sleep apnea (adult) (pediatric): Secondary | ICD-10-CM

## 2015-07-01 NOTE — Telephone Encounter (Signed)
I spoke to patient and she is aware of results and recommendations. She is willing to proceed with titration study. I will send report to PCP.  

## 2015-07-01 NOTE — Telephone Encounter (Signed)
Patient referred by Dr. Leta Baptist, seen by me on 05/08/15, diagnostic PSG on 06/25/15, ins: [employer] MCR.   Please call and notify the patient that the recent sleep study did confirm the diagnosis of mod to severe obstructive sleep apnea and that I recommend treatment for this in the form of CPAP. This will require a repeat sleep study for proper titration and mask fitting. She had significant leg twitching in her sleep too, can be seen in RLS. Please explain to patient and arrange for a CPAP titration study. I have placed an order in the chart. Thanks, and please route to [facility] for scheduling next sleep study.  Star Age, MD, PhD [facility])

## 2015-07-09 ENCOUNTER — Ambulatory Visit (AMBULATORY_SURGERY_CENTER): Payer: Self-pay

## 2015-07-09 VITALS — Ht 63.5 in | Wt 187.0 lb

## 2015-07-09 DIAGNOSIS — Z1211 Encounter for screening for malignant neoplasm of colon: Secondary | ICD-10-CM

## 2015-07-09 MED ORDER — NA SULFATE-K SULFATE-MG SULF 17.5-3.13-1.6 GM/177ML PO SOLN: ORAL | Status: DC

## 2015-07-09 NOTE — Progress Notes (Signed)
Per pt, no allergies to soy or egg products.Pt not taking any weight loss meds or using  O2 at home. 

## 2015-07-30 ENCOUNTER — Encounter: Payer: Medicare HMO | Admitting: Internal Medicine

## 2015-08-05 ENCOUNTER — Telehealth: Payer: Self-pay | Admitting: Neurology

## 2015-08-05 NOTE — Telephone Encounter (Signed)
Pt called said she having sleep study 7/31. Sts she was instructed by RN to being CPAP but when study appt was made Dawn told her she did not need to bring it. Pt is confused as to what to do. Please call

## 2015-08-05 NOTE — Telephone Encounter (Signed)
I spoke to patient and advised her that she does not need to bring her CPAP machine to titration study. She is welcome to bring her mask if she would like. Patient voiced understanding.

## 2015-08-12 ENCOUNTER — Ambulatory Visit (INDEPENDENT_AMBULATORY_CARE_PROVIDER_SITE_OTHER): Payer: Medicare HMO | Admitting: Neurology

## 2015-08-12 DIAGNOSIS — G4733 Obstructive sleep apnea (adult) (pediatric): Secondary | ICD-10-CM | POA: Diagnosis not present

## 2015-08-12 DIAGNOSIS — G4720 Circadian rhythm sleep disorder, unspecified type: Secondary | ICD-10-CM

## 2015-08-12 DIAGNOSIS — G4761 Periodic limb movement disorder: Secondary | ICD-10-CM

## 2015-08-15 ENCOUNTER — Telehealth: Payer: Self-pay | Admitting: Neurology

## 2015-08-15 DIAGNOSIS — G4733 Obstructive sleep apnea (adult) (pediatric): Secondary | ICD-10-CM

## 2015-08-15 NOTE — Telephone Encounter (Signed)
Patient referred by Dr. Leta Baptist, seen by me on 05/08/15, diagnostic PSG on 06/25/15, cpap study on 7/31/34m, ins: [employer] MCR.   Please call and inform patient that I have entered an order for treatment with positive airway pressure (PAP) treatment of obstructive sleep apnea (OSA). She did well during the latest sleep study with CPAP. We will, therefore, arrange for a machine for home use through a DME (durable medical equipment) company of Her choice; and I will see the patient back in follow-up in about 8-10 weeks. Please also explain to the patient that I will be looking out for compliance data, which can be downloaded from the machine (stored on an SD card, that is inserted in the machine) or via remote access through a modem, that is built into the machine. At the time of the followup appointment we will discuss sleep study results and how it is going with PAP treatment at home. Please advise patient to bring Her machine at the time of the first FU visit, even though this is cumbersome. Bringing the machine for every visit after that will likely not be needed, but often helps for the first visit to troubleshoot if needed. Please re-enforce the importance of compliance with treatment and the need for [place] to monitor compliance data - often an insurance requirement and actually good feedback for the patient as far as how they are doing.  Also remind patient, that any interim PAP machine or mask issues should be first addressed with the DME company, as they can often help better with technical and mask fit issues. Please ask if patient has a preference regarding DME company.  Please also make sure, the patient has a follow-up appointment with me in about 8-10 weeks from the setup date, thanks.  Once you have spoken to the patient - and faxed/routed report to PCP and referring MD (if other than PCP), you can close this encounter, thanks,   Star Age, MD, PhD [facility] ([place])

## 2015-08-15 NOTE — Telephone Encounter (Signed)
I spoke to patient and she is aware of results and recommendations. She would like orders to be sent to [facility]. I will send out referral today.

## 2015-08-26 ENCOUNTER — Encounter: Payer: Self-pay | Admitting: Internal Medicine

## 2015-08-26 ENCOUNTER — Other Ambulatory Visit (INDEPENDENT_AMBULATORY_CARE_PROVIDER_SITE_OTHER): Payer: Medicare HMO

## 2015-08-26 ENCOUNTER — Ambulatory Visit (INDEPENDENT_AMBULATORY_CARE_PROVIDER_SITE_OTHER): Payer: Medicare HMO | Admitting: Internal Medicine

## 2015-08-26 DIAGNOSIS — Z1211 Encounter for screening for malignant neoplasm of colon: Secondary | ICD-10-CM | POA: Diagnosis not present

## 2015-08-26 DIAGNOSIS — M79669 Pain in unspecified lower leg: Secondary | ICD-10-CM

## 2015-08-26 DIAGNOSIS — E785 Hyperlipidemia, unspecified: Secondary | ICD-10-CM | POA: Diagnosis not present

## 2015-08-26 DIAGNOSIS — R05 Cough: Secondary | ICD-10-CM

## 2015-08-26 DIAGNOSIS — E119 Type 2 diabetes mellitus without complications: Secondary | ICD-10-CM | POA: Diagnosis not present

## 2015-08-26 DIAGNOSIS — B351 Tinea unguium: Secondary | ICD-10-CM | POA: Diagnosis not present

## 2015-08-26 DIAGNOSIS — E669 Obesity, unspecified: Secondary | ICD-10-CM

## 2015-08-26 DIAGNOSIS — R059 Cough, unspecified: Secondary | ICD-10-CM

## 2015-08-26 LAB — BASIC METABOLIC PANEL
BUN: 16 mg/dL (ref 6–23)
CALCIUM: 9.3 mg/dL (ref 8.4–10.5)
CHLORIDE: 106 meq/L (ref 96–112)
CO2: 28 meq/L (ref 19–32)
Creatinine, Ser: 0.81 mg/dL (ref 0.40–1.20)
GFR: 74.26 mL/min (ref >60.00)
Glucose, Bld: 87 mg/dL (ref 70–99)
Potassium: 3.9 mEq/L (ref 3.5–5.1)
SODIUM: 142 meq/L (ref 135–145)

## 2015-08-26 LAB — HEMOGLOBIN A1C: HEMOGLOBIN A1C: 5.8 % (ref 4.6–6.5)

## 2015-08-26 MED ORDER — SPIRONOLACTONE 50 MG PO TABS: 50.0000 mg | ORAL_TABLET | Freq: Every day | ORAL | 11 refills | Status: DC

## 2015-08-26 NOTE — Assessment & Plan Note (Signed)
Chronic. 

## 2015-08-26 NOTE — Assessment & Plan Note (Signed)
No relapse 

## 2015-08-26 NOTE — Assessment & Plan Note (Signed)
Wt Readings from Last 3 Encounters:  08/26/15 188 lb (85.3 kg)  07/09/15 187 lb (84.8 kg)  06/12/15 184 lb 3.2 oz (83.6 kg)

## 2015-08-26 NOTE — Assessment & Plan Note (Signed)
On [facility] ref

## 2015-08-26 NOTE — Assessment & Plan Note (Signed)
R>L post leg x 1 week - recurrent over past 1-2 years; usually it would start after a bad cram Discussed Hold Lasix x 2 weeks

## 2015-08-26 NOTE — Progress Notes (Signed)
Pre visit review using our clinic review tool, if applicable. No additional management support is needed unless otherwise documented below in the visit note. 

## 2015-08-26 NOTE — Progress Notes (Signed)
Subjective:  Patient ID: Tiffany Gibbs, female    DOB: [birth year]  Age: 70 y.o. MRN: [id number]  CC: No chief complaint on file.   HPI Tiffany Gibbs presents for R>L post leg x 1 week - recurrent over past 1-2 years; usually it would start after a bad cram. F/u DM, edema.  Outpatient Medications Prior to Visit  Medication Sig Dispense Refill  . aspirin EC 81 MG tablet Take 1 tablet (81 mg total) by mouth daily. 100 tablet 3  . B Complex-C (SUPER B COMPLEX PO) Take by mouth daily.    . Cholecalciferol (VITAMIN D) 1000 UNITS capsule Take 1 capsule (1,000 Units total) by mouth daily. 100 capsule 3  . ciclopirox (PENLAC) 8 % solution Apply topically at bedtime. Apply over nail and surrounding skin. Apply daily over previous coat. After seven (7) days, may remove with alcohol and continue cycle. 6.6 mL 3  . citalopram (CELEXA) 40 MG tablet Take 1 tablet (40 mg total) by mouth daily. 90 tablet 3  . clotrimazole-betamethasone (LOTRISONE) cream Apply 1 application topically 2 (two) times daily. (Patient taking differently: Apply 1 application topically as needed. ) 45 g 1  . Cyanocobalamin (VITAMIN B-12) 1000 MCG SUBL Place 1 tablet (1,000 mcg total) under the tongue daily. 100 tablet 3  . diphenhydrAMINE (BENADRYL) 25 MG tablet Take 25 mg by mouth at bedtime as needed.     . fluticasone (FLONASE) 50 MCG/ACT nasal spray Place 1 spray into both nostrils daily as needed for allergies or rhinitis.    . furosemide (LASIX) 40 MG tablet Take 1 tablet (40 mg total) by mouth daily. 90 tablet 3  . ibuprofen (ADVIL,MOTRIN) 600 MG tablet Take 1 tablet (600 mg total) by mouth 2 (two) times daily as needed. 60 tablet 3  . loratadine (CLARITIN) 10 MG tablet Take 10 mg by mouth daily.    . metFORMIN (GLUCOPHAGE) 500 MG tablet Take 1 tablet (500 mg total) by mouth 2 (two) times daily with a meal. 180 tablet 3  . Na Sulfate-K Sulfate-Mg Sulf (SUPREP BOWEL PREP KIT) 17.5-3.13-1.6 GM/180ML SOLN Suprep as directed /  no substitutions 354 mL 0  . Omega-3 Fatty Acids (FISH OIL) 1200 MG CAPS Take 2,400 mg by mouth 2 (two) times daily.    [name] triamcinolone cream (KENALOG) 0.5 % Apply 1 application topically 3 (three) times daily as needed (to rash).     No facility-administered medications prior to visit.     ROS Review of Systems  Constitutional: Negative for activity change, appetite change, chills, fatigue and unexpected weight change.  HENT: Negative for congestion, mouth sores and sinus pressure.   Eyes: Negative for visual disturbance.  Respiratory: Negative for cough and chest tightness.   Gastrointestinal: Negative for abdominal pain and nausea.  Genitourinary: Negative for difficulty urinating, frequency and vaginal pain.  Musculoskeletal: Negative for back pain and gait problem.  Skin: Negative for pallor and rash.  Neurological: Negative for dizziness, tremors, weakness, numbness and headaches.  Psychiatric/Behavioral: Negative for confusion and sleep disturbance.    Objective:  BP 110/60   Pulse 71   Wt 188 lb (85.3 kg)   SpO2 97%   BMI 32.78 kg/m   BP Readings from Last 3 Encounters:  08/26/15 110/60  06/12/15 108/62  05/29/15 (!) 150/90    Wt Readings from Last 3 Encounters:  08/26/15 188 lb (85.3 kg)  07/09/15 187 lb (84.8 kg)  06/12/15 184 lb 3.2 oz (83.6 kg)    Physical  Exam  Constitutional: She appears well-developed. No distress.  HENT:  Head: Normocephalic.  Right Ear: External ear normal.  Left Ear: External ear normal.  Nose: Nose normal.  Mouth/Throat: Oropharynx is clear and moist.  Eyes: Conjunctivae are normal. Pupils are equal, round, and reactive to light. Right eye exhibits no discharge. Left eye exhibits no discharge.  Neck: Normal range of motion. Neck supple. No JVD present. No tracheal deviation present. No thyromegaly present.  Cardiovascular: Normal rate, regular rhythm and normal heart sounds.   Pulmonary/Chest: No stridor. No respiratory distress.  She has no wheezes.  Abdominal: Soft. Bowel sounds are normal. She exhibits no distension and no mass. There is no tenderness. There is no rebound and no guarding.  Musculoskeletal: She exhibits tenderness. She exhibits no edema.  Lymphadenopathy:    She has no cervical adenopathy.  Neurological: She displays normal reflexes. No cranial nerve deficit. She exhibits normal muscle tone. Coordination normal.  Skin: No rash noted. No erythema.  Psychiatric: She has a normal mood and affect. Her behavior is normal. Judgment and thought content normal.  R post cough is tender Mild onycho  Lab Results  Component Value Date   WBC 7.3 04/25/2015   HGB 13.6 04/25/2015   HCT 39.8 04/25/2015   PLT 241.0 04/25/2015   GLUCOSE 87 08/26/2015   CHOL 217 (H) 04/25/2015   TRIG 105.0 04/25/2015   HDL 50.60 04/25/2015   LDLDIRECT 148.2 02/03/2013   LDLCALC 146 (H) 04/25/2015   ALT 17 11/19/2014   AST 18 11/19/2014   NA 142 08/26/2015   K 3.9 08/26/2015   CL 106 08/26/2015   CREATININE 0.81 08/26/2015   BUN 16 08/26/2015   CO2 28 08/26/2015   TSH 2.01 11/19/2014   INR 1.14 11/21/2012   HGBA1C 5.8 08/26/2015    Mr Tiffany Gibbs Head Wo Contrast  Result Date: 05/15/2015 [facility] NEUROIMAG[facility] REPORT STUDY DATE: 05/13/15 PATIENT NAME: Tiffany Gibbs DOB: 1945-01-18 MRN: [id number] ORDERING CLINICIAN: Andrey Spearman, MD CLINICAL HISTORY: 70 year old female with dizziness. EXAM: MRA head (without) TECHNIQUE: MR angiogram of the head was obtained utilizing 3D time of flight sequences from below the vertebrobasilar junction up to the intracranial vasculature without contrast.  Computerized reconstructions were obtained. CONTRAST: no IMAGING SITE: [facility] [address] (1.5 Tesla MRI)  FINDINGS: This study is of adequate technical quality. Flow signal of the bilateral internal carotid arteries have no stenosis. The bilateral middle and anterior cerebral arteries have no stenosis.  The bilateral vertebral, basilar, bilateral posterior cerebral arteries have no stenosis. No aneurysmal dilatations are seen.   Normal MRA head (without). INTERPRETING PHYSICIAN: Penni Bombard, MD Certified in Neurology, Neurophysiology and Neuroimaging [facility] [address] [place], [place] [address] [phone]   Mr Angiogram Neck W [place] Contrast  Result Date: 05/15/2015 [facility] NEUROIMAG[facility] REPORT STUDY DATE: 05/13/15 PATIENT NAME: IMUNIQUE SAMAD DOB: Dec 28, 1945 MRN: [id number] ORDERING CLINICIAN: Andrey Spearman, MD CLINICAL HISTORY: 70 year old female with dizziness. EXAM: MRA neck (with and without) TECHNIQUE: MR angiogram of the neck was obtained utilizing 3D time-of-flight sequences from the aortic arch up to the intracranial vasculature postbolus contrast infusion.  2D time-of-flight noncontrast views and computerized reconstructions were obtained. CONTRAST: 50m multihance [facility] [address](1.5 Tesla MRI)  FINDINGS: This study is of adequate technical quality.  There is anterograde flow in the bilateral vertebral and carotid arteries on 2D-TOF views.  The flow signal  of the left subclavian artery has no stenosis.  The left common, internal and external carotid arteries have no stenosis.  The left vertebral artery has no stenosis from its origin up to the vertebrobasilar junction. There is subtle, focal signal decline in the the proximal left vertebral artery on 3D reconstructions but not on source images, and therefore likely artifactual. In addition, there is normal appearing flow through the left vertebral artery superiorly up to the vertebro-basilar junction. On the right brachiocephalic trunk and subclavian arteries have no stenosis. The right common, internal and external carotid arteries have no stenosis. The right vertebral artery has no stenosis from its origin to the vertebrobasilar junction.  Limited views of the intracranial vasculature are unremarkable.   Normal MRA neck (with and without). INTERPRETING PHYSICIAN: Penni Bombard, MD Certified in Neurology, Neurophysiology and Neuroimaging [facility] [address] [place], [place] [address] [phone]   Mr Brain Wo Contrast  Result Date: 05/15/2015 [facility] NEUROIMAG[facility] REPORT STUDY DATE: 05/13/15 PATIENT NAME: LILIANNA CASE DOB: [birth year] MRN: [id number] ORDERING CLINICIAN: Andrey Spearman, MD CLINICAL HISTORY: 70 year old female with dizziness. EXAM: MRI brain (without) TECHNIQUE: MRI of the brain without contrast was obtained utilizing 5 mm axial slices with T1, T2, T2 flair, SWI and diffusion weighted views.  T1 sagittal and T2 coronal views were obtained. CONTRAST: no IMAGING SITE: [facility] [address] (1.5 Tesla MRI)  FINDINGS: No abnormal lesions are seen on diffusion-weighted views to suggest acute ischemia. The cortical sulci, fissures and cisterns are normal in size and appearance. Lateral, third and fourth ventricle are normal in size and appearance. No extra-axial fluid collections are seen. No evidence of mass effect or midline shift.  On sagittal views the posterior fossa, pituitary gland and corpus callosum are unremarkable. No evidence of intracranial hemorrhage on SWI views. The orbits and their contents, paranasal sinuses and calvarium are notable for right maxillary mucus retention cyst. Intracranial flow voids are present.   Normal MRI brain (without). INTERPRETING PHYSICIAN: Penni Bombard, MD Certified in Neurology, Neurophysiology and Neuroimaging [facility] [address] [place], [place] [address] [phone]    Assessment & Plan:   There are no diagnoses linked to this encounter. I am having Ms. Frazee maintain her Vitamin D, Fish Oil, diphenhydrAMINE, loratadine, fluticasone, triamcinolone cream,  ciclopirox, aspirin EC, clotrimazole-betamethasone, furosemide, metFORMIN, citalopram, Vitamin B-12, ibuprofen, B Complex-C (SUPER B COMPLEX PO), and Na Sulfate-K Sulfate-Mg Sulf.  No orders of the defined types were placed in this encounter.    Follow-up: No Follow-up on file.  Walker Kehr, MD

## 2015-08-27 ENCOUNTER — Telehealth: Payer: Self-pay | Admitting: Emergency Medicine

## 2015-08-27 DIAGNOSIS — E119 Type 2 diabetes mellitus without complications: Secondary | ICD-10-CM

## 2015-08-27 LAB — HEPATITIS C ANTIBODY: HCV AB: NEGATIVE

## 2015-08-27 NOTE — Telephone Encounter (Addendum)
Pt called and wants to know if she can use her Lioderm Patch on her right calf. She also wants to make sure her lab orders get put in for 11/15. Please follow up thanks.

## 2015-08-28 NOTE — Addendum Note (Signed)
Addended by: Earnstine Regal on: 08/28/2015 11:48 AM   Modules accepted: Orders

## 2015-08-28 NOTE — Telephone Encounter (Signed)
Notified pt w/MD response. A1c has been ordered...Tiffany Gibbs

## 2015-08-28 NOTE — Telephone Encounter (Signed)
Ok to use the patch Pls make sure the labs are in Thx

## 2015-09-04 ENCOUNTER — Telehealth: Payer: Self-pay | Admitting: Internal Medicine

## 2015-09-04 ENCOUNTER — Encounter: Payer: Medicare HMO | Admitting: Internal Medicine

## 2015-09-04 NOTE — Telephone Encounter (Signed)
New instructions mailed to pt. 

## 2015-09-05 ENCOUNTER — Telehealth: Payer: Self-pay | Admitting: Neurology

## 2015-09-05 NOTE — Telephone Encounter (Signed)
Patient wants to know whether or not she will get new equipment since the cpap.  Please give her a call

## 2015-09-05 NOTE — Telephone Encounter (Signed)
I called patient back. She stated that she never received results from sleep study. I reminded her that we spoke on 8/3 and she requested orders to be sent to [facility]. Orders were faxed at that time. I offered to give her their number, patient asked for me to call them instead. I said that I would. Calling [facility] now to ask for f/u.

## 2015-09-05 NOTE — Telephone Encounter (Signed)
Refaxing orders now

## 2015-09-09 ENCOUNTER — Ambulatory Visit (AMBULATORY_SURGERY_CENTER): Payer: Medicare HMO | Admitting: Internal Medicine

## 2015-09-09 ENCOUNTER — Telehealth: Payer: Self-pay | Admitting: Emergency Medicine

## 2015-09-09 ENCOUNTER — Encounter: Payer: Self-pay | Admitting: Internal Medicine

## 2015-09-09 VITALS — BP 132/65 | HR 58 | Temp 97.5°F | Resp 11 | Ht 63.5 in | Wt 187.0 lb

## 2015-09-09 DIAGNOSIS — M797 Fibromyalgia: Secondary | ICD-10-CM | POA: Diagnosis not present

## 2015-09-09 DIAGNOSIS — D125 Benign neoplasm of sigmoid colon: Secondary | ICD-10-CM | POA: Diagnosis not present

## 2015-09-09 DIAGNOSIS — Z1211 Encounter for screening for malignant neoplasm of colon: Secondary | ICD-10-CM

## 2015-09-09 DIAGNOSIS — R69 Illness, unspecified: Secondary | ICD-10-CM | POA: Diagnosis not present

## 2015-09-09 DIAGNOSIS — I10 Essential (primary) hypertension: Secondary | ICD-10-CM | POA: Diagnosis not present

## 2015-09-09 DIAGNOSIS — E119 Type 2 diabetes mellitus without complications: Secondary | ICD-10-CM | POA: Diagnosis not present

## 2015-09-09 DIAGNOSIS — G4733 Obstructive sleep apnea (adult) (pediatric): Secondary | ICD-10-CM | POA: Diagnosis not present

## 2015-09-09 LAB — GLUCOSE, CAPILLARY
Glucose-Capillary: 81 mg/dL (ref 65–99)
Glucose-Capillary: 104 mg/dL — ABNORMAL HIGH (ref 65–99)

## 2015-09-09 MED ORDER — SPIRONOLACTONE 50 MG PO TABS: 50.0000 mg | ORAL_TABLET | Freq: Every day | ORAL | 3 refills | Status: DC

## 2015-09-09 MED ORDER — DEXTROSE 5 % IV SOLN: INTRAVENOUS | Status: DC

## 2015-09-09 MED ORDER — SODIUM CHLORIDE 0.9 % IV SOLN: 500.0000 mL | INTRAVENOUS | Status: DC

## 2015-09-09 NOTE — Op Note (Signed)
[facility] Patient Name: Tiffany Gibbs Procedure Date: 09/09/2015 1:26 PM MRN: [id number] Endoscopist: Jerene Bears , MD Age: 70 Referring MD:  Date of Birth: 1945-09-22 Gender: Female Account #: [id number] Procedure:                Colonoscopy Indications:              Screening for colorectal malignant neoplasm, last                            colonoscopy 19 years ago Medicines:                Monitored Anesthesia Care Procedure:                Pre-Anesthesia Assessment:                           - Prior to the procedure, a History and Physical                            was performed, and patient medications and                            allergies were reviewed. The patient's tolerance of                            previous anesthesia was also reviewed. The risks                            and benefits of the procedure and the sedation                            options and risks were discussed with the patient.                            All questions were answered, and informed consent                            was obtained. Prior Anticoagulants: The patient has                            taken no previous anticoagulant or antiplatelet                            agents. ASA Grade Assessment: III - A patient with                            severe systemic disease. After reviewing the risks                            and benefits, the patient was deemed in                            satisfactory condition to undergo the procedure.  After obtaining informed consent, the colonoscope                            was passed under direct vision. Throughout the                            procedure, the patient's blood pressure, pulse, and                            oxygen saturations were monitored continuously. The                            Model PCF-H190L [id number]) scope was introduced                            through the anus and advanced to  the the cecum,                            identified by appendiceal orifice and ileocecal                            valve. The colonoscopy was performed without                            difficulty. The patient tolerated the procedure                            well. The quality of the bowel preparation was                            good. The ileocecal valve, appendiceal orifice, and                            rectum were photographed. Scope In: 1:37:14 PM Scope Out: 1:53:41 PM Scope Withdrawal Time: 0 hours 9 minutes 59 seconds  Total Procedure Duration: 0 hours 16 minutes 27 seconds  Findings:                 The digital rectal exam was normal.                           A 4 mm polyp was found in the sigmoid colon. The                            polyp was sessile. The polyp was removed with a                            cold snare. Resection and retrieval were complete.                           Internal hemorrhoids were found during                            retroflexion. The hemorrhoids were small.  The exam was otherwise without abnormality. Complications:            No immediate complications. Estimated Blood Loss:     Estimated blood loss: none. Impression:               - One 4 mm polyp in the sigmoid colon, removed with                            a cold snare. Resected and retrieved.                           - Small internal hemorrhoids.                           - The examination was otherwise normal. Recommendation:           - Patient has a contact number available for                            emergencies. The signs and symptoms of potential                            delayed complications were discussed with the                            patient. Return to normal activities tomorrow.                            Written discharge instructions were provided to the                            patient.                           - Resume previous  diet.                           - Continue present medications.                           - Await pathology results.                           - Repeat colonoscopy is recommended. The                            colonoscopy date will be determined after pathology                            results from today's exam become available for                            review. Jerene Bears, MD 09/09/2015 1:58:21 PM This report has been signed electronically.

## 2015-09-09 NOTE — Progress Notes (Signed)
Report to PACU, RN, vss, BBS= Clear.  

## 2015-09-09 NOTE — Progress Notes (Signed)
Called to room to assist during endoscopic procedure.  Patient ID and intended procedure confirmed with present staff. Received instructions for my participation in the procedure from the performing physician.  

## 2015-09-09 NOTE — Telephone Encounter (Signed)
Called pt no answer LMOM will print rx have MD to sign and leave at the front desk for pick-up...Tiffany Gibbs

## 2015-09-09 NOTE — Telephone Encounter (Signed)
Pt called and needs a prescription refill on Spironolactone for 90 pills with 3 refills for [employer]. Patient would like to know if you can mail the prescription to her if not she will be here for her colonoscopy and her husband can come down and pick it up. You can call Dr Berlin Hun office after 1230 and ask them to send her husband down. Please advise thanks.

## 2015-09-09 NOTE — Patient Instructions (Signed)
YOU HAD AN ENDOSCOPIC PROCEDURE TODAY AT [facility]:   Refer to the procedure report that was given to you for any specific questions about what was found during the examination.  If the procedure report does not answer your questions, please call your gastroenterologist to clarify.  If you requested that your care partner not be given the details of your procedure findings, then the procedure report has been included in a sealed envelope for you to review at your convenience later.  YOU SHOULD EXPECT: Some feelings of bloating in the abdomen. Passage of more gas than usual.  Walking can help get rid of the air that was put into your GI tract during the procedure and reduce the bloating. If you had a lower endoscopy (such as a colonoscopy or flexible sigmoidoscopy) you may notice spotting of blood in your stool or on the toilet paper. If you underwent a bowel prep for your procedure, you may not have a normal bowel movement for a few days.  Please Note:  You might notice some irritation and congestion in your nose or some drainage.  This is from the oxygen used during your procedure.  There is no need for concern and it should clear up in a day or so.  SYMPTOMS TO REPORT IMMEDIATELY:   Following lower endoscopy (colonoscopy or flexible sigmoidoscopy):  Excessive amounts of blood in the stool  Significant tenderness or worsening of abdominal pains  Swelling of the abdomen that is new, acute  Fever of 100F or higher   For urgent or emergent issues, a gastroenterologist can be reached at any hour by calling [phone].   DIET:  We do recommend a small meal at first, but then you may proceed to your regular diet.  Drink plenty of fluids but you should avoid alcoholic beverages for 24 hours.  ACTIVITY:  You should plan to take it easy for the rest of today and you should NOT DRIVE or use heavy machinery until tomorrow (because of the sedation medicines used during the test).     FOLLOW UP: Our staff will call the number listed on your records the next business day following your procedure to check on you and address any questions or concerns that you may have regarding the information given to you following your procedure. If we do not reach you, we will leave a message.  However, if you are feeling well and you are not experiencing any problems, there is no need to return our call.  We will assume that you have returned to your regular daily activities without incident.  If any biopsies were taken you will be contacted by phone or by letter within the next 1-3 weeks.  Please call us at [phone] if you have not heard about the biopsies in 3 weeks.    SIGNATURES/CONFIDENTIALITY: You and/or your care partner have signed paperwork which will be entered into your electronic medical record.  These signatures attest to the fact that that the information above on your After Visit Summary has been reviewed and is understood.  Full responsibility of the confidentiality of this discharge information lies with you and/or your care-partner.   Polyp and hemorrhoid handouts provided. Continue present medications.

## 2015-09-10 ENCOUNTER — Telehealth: Payer: Self-pay | Admitting: *Deleted

## 2015-09-10 NOTE — Telephone Encounter (Signed)
  Follow up Call-  Call back number 09/09/2015  Post procedure Call Back phone  # [phone]  Permission to leave phone message Yes  Some recent data might be hidden     Patient questions:  Do you have a fever, pain , or abdominal swelling? No. Pain Score  0 *  Have you tolerated food without any problems? Yes.    Have you been able to return to your normal activities? Yes.    Do you have any questions about your discharge instructions: Diet   No. Medications  No. Follow up visit  No.  Do you have questions or concerns about your Care? No.  Actions: * If pain score is 4 or above: No action needed, pain <4.

## 2015-09-13 ENCOUNTER — Encounter: Payer: Self-pay | Admitting: Internal Medicine

## 2015-09-18 ENCOUNTER — Telehealth: Payer: Self-pay

## 2015-09-18 DIAGNOSIS — M810 Age-related osteoporosis without current pathological fracture: Secondary | ICD-10-CM

## 2015-09-18 NOTE — Telephone Encounter (Signed)
Patient called in wanting a bone density test order. Please advise. Thank you.

## 2015-09-18 NOTE — Telephone Encounter (Signed)
Call patient and made an app for 9/13

## 2015-09-18 NOTE — Telephone Encounter (Signed)
BDS order placed. Please call pt to schedule a time. Thanks!

## 2015-09-24 ENCOUNTER — Telehealth: Payer: Self-pay | Admitting: Emergency Medicine

## 2015-09-24 NOTE — Telephone Encounter (Signed)
Pt is coming in tomorrow for a flu shot. She wants to know if you can give her a prednisone shot while she is here so she doesn't have to make the drive back here. It is a nurse visit she is coming in for not an appt with you. Please advise. Thanks.

## 2015-09-24 NOTE — Telephone Encounter (Signed)
Ok at [employer]

## 2015-09-25 ENCOUNTER — Ambulatory Visit: Payer: Medicare HMO

## 2015-09-25 ENCOUNTER — Ambulatory Visit (INDEPENDENT_AMBULATORY_CARE_PROVIDER_SITE_OTHER)
Admission: RE | Admit: 2015-09-25 | Discharge: 2015-09-25 | Disposition: A | Discharge status: Home / Self Care | Payer: Medicare HMO | Source: Ambulatory Visit | Attending: Internal Medicine | Admitting: Internal Medicine

## 2015-09-25 ENCOUNTER — Ambulatory Visit (INDEPENDENT_AMBULATORY_CARE_PROVIDER_SITE_OTHER): Payer: Medicare HMO | Admitting: Internal Medicine

## 2015-09-25 ENCOUNTER — Encounter: Payer: Self-pay | Admitting: Internal Medicine

## 2015-09-25 DIAGNOSIS — Z23 Encounter for immunization: Secondary | ICD-10-CM

## 2015-09-25 DIAGNOSIS — M810 Age-related osteoporosis without current pathological fracture: Secondary | ICD-10-CM

## 2015-09-25 DIAGNOSIS — M25551 Pain in right hip: Secondary | ICD-10-CM

## 2015-09-25 DIAGNOSIS — G4733 Obstructive sleep apnea (adult) (pediatric): Secondary | ICD-10-CM | POA: Diagnosis not present

## 2015-09-25 MED ORDER — METHYLPREDNISOLONE ACETATE 80 MG/ML IJ SUSP
80.0000 mg | Freq: Once | INTRAMUSCULAR | Status: AC
  Administered 2015-09-25: 80 mg via INTRAMUSCULAR

## 2015-09-25 NOTE — Progress Notes (Signed)
Subjective:  Patient ID: Tiffany Gibbs, female    DOB: December 14, 1945  Age: 70 y.o. MRN: [id number]  CC: Hip Pain (Left) and Knee Pain (Right)   HPI Tiffany Gibbs presents for a c/o severe hip pain on the R x weeks; worse  Outpatient Medications Prior to Visit  Medication Sig Dispense Refill  . aspirin EC 81 MG tablet Take 1 tablet (81 mg total) by mouth daily. 100 tablet 3  . B Complex-C (SUPER B COMPLEX PO) Take by mouth daily.    . Cholecalciferol (VITAMIN D) 1000 UNITS capsule Take 1 capsule (1,000 Units total) by mouth daily. 100 capsule 3  . ciclopirox (PENLAC) 8 % solution Apply topically at bedtime. Apply over nail and surrounding skin. Apply daily over previous coat. After seven (7) days, may remove with alcohol and continue cycle. 6.6 mL 3  . citalopram (CELEXA) 40 MG tablet Take 1 tablet (40 mg total) by mouth daily. 90 tablet 3  . clotrimazole-betamethasone (LOTRISONE) cream Apply 1 application topically 2 (two) times daily. (Patient taking differently: Apply 1 application topically as needed. ) 45 g 1  . Cyanocobalamin (VITAMIN B-12) 1000 MCG SUBL Place 1 tablet (1,000 mcg total) under the tongue daily. 100 tablet 3  . diphenhydrAMINE (BENADRYL) 25 MG tablet Take 25 mg by mouth at bedtime as needed.     . fluticasone (FLONASE) 50 MCG/ACT nasal spray Place 1 spray into both nostrils daily as needed for allergies or rhinitis.    [name] ibuprofen (ADVIL,MOTRIN) 600 MG tablet Take 1 tablet (600 mg total) by mouth 2 (two) times daily as needed. 60 tablet 3  . loratadine (CLARITIN) 10 MG tablet Take 10 mg by mouth daily.    . metFORMIN (GLUCOPHAGE) 500 MG tablet Take 1 tablet (500 mg total) by mouth 2 (two) times daily with a meal. 180 tablet 3  . Omega-3 Fatty Acids (FISH OIL) 1200 MG CAPS Take 2,400 mg by mouth 2 (two) times daily.    [name] spironolactone (ALDACTONE) 50 MG tablet Take 1 tablet (50 mg total) by mouth daily. 90 tablet 3  . triamcinolone cream (KENALOG) 0.5 % Apply 1  application topically 3 (three) times daily as needed (to rash).     Facility-Administered Medications Prior to Visit  Medication Dose Route Frequency Provider Last Rate Last Dose  . 0.9 %  sodium chloride infusion  500 mL Intravenous Continuous Lajuan Lines Pyrtle, MD      . 0.9 %  sodium chloride infusion  500 mL Intravenous Continuous Jerene Bears, MD      . dextrose 5 % solution   Intravenous Continuous Jerene Bears, MD        ROS Review of Systems  Musculoskeletal: Positive for arthralgias and gait problem.    Objective:  BP 100/60   Pulse 68   Temp 98.2 F (36.8 C) (Oral)   Wt 187 lb (84.8 kg)   SpO2 96%   BMI 32.61 kg/m   BP Readings from Last 3 Encounters:  09/25/15 100/60  09/09/15 132/65  08/26/15 110/60    Wt Readings from Last 3 Encounters:  09/25/15 187 lb (84.8 kg)  09/09/15 187 lb (84.8 kg)  08/26/15 188 lb (85.3 kg)    Physical Exam  Musculoskeletal: She exhibits tenderness.  R lat hip is tender   Procedure Note :     Procedure : Joint Injection,  R  hip   Indication:  Trochanteric bursitis with refractory  chronic pain.   Risks  including unsuccessful procedure , bleeding, infection, bruising, skin atrophy, "steroid flare-up" and others were explained to the patient in detail as well as the benefits. Informed consent was obtained and signed.   Tthe patient was placed in a comfortable lateral decubitus position. The point of maximal tenderness was identified. Skin was prepped with Betadine and alcohol. Then, a 5 cc syringe with a 2 inch long 24-gauge needle was used for a bursa injection.. The needle was advanced  Into the bursa. I injected the bursa with 4 mL of 2% lidocaine and 80 mg of Depo-Medrol .  Band-Aid was applied.   Tolerated well. Complications: None. Good pain relief following the procedure.   Postprocedure instructions :    A Band-Aid should be left on for 12 hours. Injection therapy is not a cure itself. It is used in conjunction with other  modalities. You can use nonsteroidal anti-inflammatories like ibuprofen , hot and cold compresses. Rest is recommended in the next 24 hours. You need to report immediately  if fever, chills or any signs of infection develop.       Lab Results  Component Value Date   WBC 7.3 04/25/2015   HGB 13.6 04/25/2015   HCT 39.8 04/25/2015   PLT 241.0 04/25/2015   GLUCOSE 87 08/26/2015   CHOL 217 (H) 04/25/2015   TRIG 105.0 04/25/2015   HDL 50.60 04/25/2015   LDLDIRECT 148.2 02/03/2013   LDLCALC 146 (H) 04/25/2015   ALT 17 11/19/2014   AST 18 11/19/2014   NA 142 08/26/2015   K 3.9 08/26/2015   CL 106 08/26/2015   CREATININE 0.81 08/26/2015   BUN 16 08/26/2015   CO2 28 08/26/2015   TSH 2.01 11/19/2014   INR 1.14 11/21/2012   HGBA1C 5.8 08/26/2015    No results found.  Assessment & Plan:   There are no diagnoses linked to this encounter. I am having Ms. Steichen maintain her Vitamin D, Fish Oil, diphenhydrAMINE, loratadine, fluticasone, triamcinolone cream, ciclopirox, aspirin EC, clotrimazole-betamethasone, metFORMIN, citalopram, Vitamin B-12, ibuprofen, B Complex-C (SUPER B COMPLEX PO), and spironolactone. We will continue to administer sodium chloride, sodium chloride, and dextrose.  No orders of the defined types were placed in this encounter.    Follow-up: No Follow-up on file.  Walker Kehr, MD

## 2015-09-25 NOTE — Progress Notes (Signed)
Pre visit review using our clinic review tool, if applicable. No additional management support is needed unless otherwise documented below in the visit note. 

## 2015-09-25 NOTE — Telephone Encounter (Signed)
Informed pt and she stated she would be here at 4:30.

## 2015-09-25 NOTE — Addendum Note (Signed)
Addended by: Cresenciano Lick on: 09/25/2015 05:15 PM   Modules accepted: Orders

## 2015-09-25 NOTE — Patient Instructions (Signed)
Postprocedure instructions :    A Band-Aid should be left on for 12 hours. Injection therapy is not a cure itself. It is used in conjunction with other modalities. You can use nonsteroidal anti-inflammatories like ibuprofen , hot and cold compresses. Rest is recommended in the next 24 hours. You need to report immediately  if fever, chills or any signs of infection develop. 

## 2015-09-25 NOTE — Assessment & Plan Note (Signed)
R hip Options to treat discussed - elected injecton. See procedure

## 2015-09-27 ENCOUNTER — Telehealth: Payer: Self-pay | Admitting: Neurology

## 2015-09-27 NOTE — Telephone Encounter (Signed)
Patient called to let Dr. Rexene Alberts know that "she got new breathing machine from [facility], had the best 2 nights sleep in a couple of years, slept 8 1/2 hours last night and 6 1/2 hours the night before that. She feels like she could go out dancing".

## 2015-09-30 NOTE — Telephone Encounter (Signed)
Nice to hear, that she feels better!

## 2015-10-10 DIAGNOSIS — Z1231 Encounter for screening mammogram for malignant neoplasm of breast: Secondary | ICD-10-CM | POA: Diagnosis not present

## 2015-10-10 LAB — HM MAMMOGRAPHY

## 2015-10-12 DIAGNOSIS — G4733 Obstructive sleep apnea (adult) (pediatric): Secondary | ICD-10-CM | POA: Diagnosis not present

## 2015-10-17 DIAGNOSIS — G4733 Obstructive sleep apnea (adult) (pediatric): Secondary | ICD-10-CM | POA: Diagnosis not present

## 2015-10-22 ENCOUNTER — Encounter: Payer: Self-pay | Admitting: Internal Medicine

## 2015-10-25 DIAGNOSIS — G4733 Obstructive sleep apnea (adult) (pediatric): Secondary | ICD-10-CM | POA: Diagnosis not present

## 2015-11-07 NOTE — Progress Notes (Signed)
[facility] [address] [place], [place] [address] Phone: [phone] Subjective:    I'm seeing this patient by the request  of:    CC: Leg pain,  [id number]  Tiffany Gibbs is a 70 y.o. female coming in with complaint of right hip pain. Patient has had this pain for greater than 6 weeks. Patient was seen by primary care provider was given an injection of the greater trochanteric area. Patient states The pain in her side seems to be little bit better but unfortunate she continues to have a cramping sensation in her calves bilaterally. Patient denies any swelling. Feels that the change in her water pill was somewhat helpful but not completely resolving at. Waking her up at night multiple times. Patient states that sometimes she has to get out of bed and move around. Has to move around. States that there is constant numbness. Denieas any shortness of breath, concern due to her mother dying of a PE in her 65's.       Past Medical History:  Diagnosis Date  . Anxiety   . Cataract   . Depression   . DM type 2 (diabetes mellitus, type 2) ([facility])   . Fibromyalgia   . H/O dizziness   . Hives    from tomatoes  . HTN (hypertension)   . Hyperlipidemia   . Low back pain   . OSA (obstructive sleep apnea)    uses c-pap  . Osteoarthritis   . Osteoporosis   . SOB (shortness of breath) on exertion    Past Surgical History:  Procedure Laterality Date  . cervical cryotherapy     cervix  . EYE SURGERY     Cataracts/Bil  . LASIK Bilateral   . TOTAL ABDOMINAL HYSTERECTOMY    . TOTAL HIP ARTHROPLASTY  09/2008   right- [facility]/  . TOTAL HIP ARTHROPLASTY Left 11/28/2012   Procedure: TOTAL HIP ARTHROPLASTY;  Surgeon: Kerin Salen, MD;  Location: [facility];  Service: Orthopedics;  Laterality: Left;   Social History   Social History  . Marital status: Married    Spouse name: Sam  . Number of children: 1  . Years of education: 53    Occupational History  . N/A      Social History Main Topics  . Smoking status: Former Smoker    Packs/day: 2.00    Years: 25.00    Types: Cigarettes    Quit date: 01/13/1983  . Smokeless tobacco: Never Used  . Alcohol use No  . Drug use: No  . Sexual activity: Yes   Other Topics Concern  . None   Social History Narrative   Was divorced - newly re-married as of 2009, Sam   1 son      Caffeine use- coffee 1 cup daily       Allergies  Allergen Reactions  . Celecoxib     swell  . Enalapril     cough  . Furosemide     cramps  . Losartan     falls  . Lovastatin     REACTION: aches  . Naproxen    Family History  Problem Relation Age of Onset  . Hypertension Mother   . Diabetes Mother   . Heart disease Father   . Multiple sclerosis Father   . Heart attack Father     Past medical history, social, surgical and family history all reviewed in electronic medical record.  No pertanent information unless stated regarding  to the chief complaint.   Review of Systems: No headache, visual changes, nausea, vomiting, diarrhea, constipation, dizziness, abdominal pain, skin rash, fevers, chills, night sweats, weight loss, swollen lymph nodes, body aches, joint swelling, muscle aches, chest pain, shortness of breath, mood changes.   Objective  Blood pressure 122/70, pulse 76, weight 190 lb (86.2 kg), SpO2 97 %.  General: No apparent distress alert and oriented x3 mood and affect normal, dressed appropriately.  HEENT: Pupils equal, extraocular movements intact  Respiratory: Patient's speak in full sentences and does not appear short of breath  Cardiovascular: No lower extremity edema, non tender, no erythema  Skin: Warm dry intact with no signs of infection or rash on extremities or on axial skeleton.  Abdomen: Soft nontender  Neuro: Cranial nerves II through XII are intact, neurovascularly intact in all extremities with 2+ DTRs and 2+ pulses.  Lymph: No lymphadenopathy of posterior or anterior cervical chain or  axillae bilaterally.  Gait normal with good balance and coordination.  MSK:  Non tender with full range of motion and good stability and symmetric strength and tone of shoulders, elbows, wrist, hip, knee and ankles bilaterally.  Lower leg exam shows the patient has a negative Thompson test. Deep tendon reflexes are intact. Pulses are 2+ and brisk of the dorsalis pedis. Full range of motion of the ankle. Nontender with palpation   Back exam shows that patient is tender to palpation in the paraspinal musculature of L4-S1 bilaterally. Patient does have what appears to be significant tightness of the hamstrings with increase radiation down the legs with straight leg test. Unable to do [facility] test bilaterally.  Limited [place] skeletal ultrasound was performed and interpreted by Lyndal Pulley  Limited ultrasound the patient's calves bilaterally did not show any significant abnormality. Patient does show some mild dehydration but otherwise unremarkable. Impression: Normal with mild dehydration   Impression and Recommendations:     This case required medical decision making of moderate complexity.      Note: This dictation was prepared with Dragon dictation along with smaller phrase technology. Any transcriptional errors that result from this process are unintentional.

## 2015-11-08 ENCOUNTER — Ambulatory Visit (INDEPENDENT_AMBULATORY_CARE_PROVIDER_SITE_OTHER)
Admission: RE | Admit: 2015-11-08 | Discharge: 2015-11-08 | Disposition: A | Discharge status: Home / Self Care | Payer: Medicare HMO | Source: Ambulatory Visit | Attending: Family Medicine | Admitting: Family Medicine

## 2015-11-08 ENCOUNTER — Ambulatory Visit (INDEPENDENT_AMBULATORY_CARE_PROVIDER_SITE_OTHER): Payer: Medicare HMO | Admitting: Family Medicine

## 2015-11-08 ENCOUNTER — Encounter: Payer: Self-pay | Admitting: Family Medicine

## 2015-11-08 ENCOUNTER — Ambulatory Visit: Payer: Self-pay

## 2015-11-08 VITALS — BP 122/70 | HR 76 | Wt 190.0 lb

## 2015-11-08 DIAGNOSIS — M79605 Pain in left leg: Secondary | ICD-10-CM | POA: Diagnosis not present

## 2015-11-08 DIAGNOSIS — M79604 Pain in right leg: Secondary | ICD-10-CM

## 2015-11-08 DIAGNOSIS — M79669 Pain in unspecified lower leg: Secondary | ICD-10-CM

## 2015-11-08 DIAGNOSIS — M545 Low back pain: Secondary | ICD-10-CM | POA: Diagnosis not present

## 2015-11-08 MED ORDER — GABAPENTIN 100 MG PO CAPS: 200.0000 mg | ORAL_CAPSULE | Freq: Every day | ORAL | 3 refills | Status: DC

## 2015-11-08 NOTE — Assessment & Plan Note (Signed)
Bilateral low leg pain. Has had cramping previously. Discussed the differential being a lumbar radiculopathy. Started on gabapentin. Possible iron deficiency and dehydration is also within the differential. Patient will try over-the-counter supplementation. We did discuss the possibility of getting Dopplers to rule out any type of clot which patient declined. We discussed that we will follow up closely on the next 2 weeks and warned of signs and symptoms and when to seek medical attention. Patient will try the conservative therapy and we will see her again in 2-3 weeks.

## 2015-11-08 NOTE — Patient Instructions (Signed)
Good to see you  For the legs it is likely coming from your back and possibly low iron.  Gabapentin 200mg  at night  Over the counter Vitamin D 2000 IU daily  Iron 65mg  with 500mg  of vitamin C 3 times a week Increase water to 60-80 oz daily  Xray downstairs of your back  See me again in 2-3 weeks to make sure you are doing well or if we need more tests.

## 2015-11-25 ENCOUNTER — Telehealth: Payer: Self-pay | Admitting: Internal Medicine

## 2015-11-25 DIAGNOSIS — E119 Type 2 diabetes mellitus without complications: Secondary | ICD-10-CM

## 2015-11-25 DIAGNOSIS — G4733 Obstructive sleep apnea (adult) (pediatric): Secondary | ICD-10-CM | POA: Diagnosis not present

## 2015-11-25 NOTE — Telephone Encounter (Signed)
Pt called stating she received letter from her insurance telling her get her urine test to check her kidney function. Please advise, she was wondering if she can get it done on 12/04/15. Please call her back

## 2015-11-26 NOTE — Telephone Encounter (Signed)
They probably mean a urine Microalbumine - ok to order  Thx

## 2015-11-27 ENCOUNTER — Ambulatory Visit: Payer: Medicare HMO | Admitting: Internal Medicine

## 2015-11-27 DIAGNOSIS — G4733 Obstructive sleep apnea (adult) (pediatric): Secondary | ICD-10-CM | POA: Diagnosis not present

## 2015-11-28 NOTE — Telephone Encounter (Signed)
Microalbumin order placed. Left detailed mess informing pt.

## 2015-12-03 NOTE — Progress Notes (Signed)
[facility] [address] [place], [place] [address] Phone: [phone] Subjective:      CC: Leg pain Follow-up  [id number]  Tiffany Gibbs is a 70 y.o. female coming in with complaint of right hip pain. Patient also had some radiation going down both legs. Patient did have x-rays of the lower back that were independently visualized by me showing degenerative facet disease. Patient was to try conservative therapy including home exercises and low dose of gabapentin. We encouraged iron and vitamin D supplementation. Patient states She is doing significantly better. States that she is about 80-90% better. States that she is no longer having cramping in the legs. Feels like she is making progress. Able to do daily activities with no pain. Sleeping more comfortably at night as well. No side effects to the medications. Patient does not want to be on medications long-term.      Past Medical History:  Diagnosis Date  . Anxiety   . Cataract   . Depression   . DM type 2 (diabetes mellitus, type 2) ([facility])   . Fibromyalgia   . H/O dizziness   . Hives    from tomatoes  . HTN (hypertension)   . Hyperlipidemia   . Low back pain   . OSA (obstructive sleep apnea)    uses c-pap  . Osteoarthritis   . Osteoporosis   . SOB (shortness of breath) on exertion    Past Surgical History:  Procedure Laterality Date  . cervical cryotherapy     cervix  . EYE SURGERY     Cataracts/Bil  . LASIK Bilateral   . TOTAL ABDOMINAL HYSTERECTOMY    . TOTAL HIP ARTHROPLASTY  09/2008   right- [facility]/  . TOTAL HIP ARTHROPLASTY Left 11/28/2012   Procedure: TOTAL HIP ARTHROPLASTY;  Surgeon: Kerin Salen, MD;  Location: [facility];  Service: Orthopedics;  Laterality: Left;   Social History   Social History  . Marital status: Married    Spouse name: Sam  . Number of children: 1  . Years of education: 56    Occupational History  . N/A    Social History Main Topics  . Smoking  status: Former Smoker    Packs/day: 2.00    Years: 25.00    Types: Cigarettes    Quit date: 01/13/1983  . Smokeless tobacco: Never Used  . Alcohol use No  . Drug use: No  . Sexual activity: Yes   Other Topics Concern  . None   Social History Narrative   Was divorced - newly re-married as of 2009, Sam   1 son      Caffeine use- coffee 1 cup daily       Allergies  Allergen Reactions  . Celecoxib     swell  . Enalapril     cough  . Furosemide     cramps  . Losartan     falls  . Lovastatin     REACTION: aches  . Naproxen    Family History  Problem Relation Age of Onset  . Hypertension Mother   . Diabetes Mother   . Heart disease Father   . Multiple sclerosis Father   . Heart attack Father     Past medical history, social, surgical and family history all reviewed in electronic medical record.  No pertanent information unless stated regarding to the chief complaint.   Review of Systems: No headache, visual changes, nausea, vomiting, diarrhea, constipation, dizziness, abdominal pain, skin rash,  fevers, chills, night sweats, weight loss, swollen lymph nodes, body aches, joint swelling, muscle aches, chest pain, shortness of breath, mood changes.   Objective  Blood pressure 130/72, pulse 64, height 5' 3.5" (1.613 m), weight 201 lb (91.2 kg), SpO2 98 %.  Systems examined below as of 12/04/15 General: NAD A&O x3 mood, affect normal  HEENT: Pupils equal, extraocular movements intact no nystagmus Respiratory: not short of breath at rest or with speaking Cardiovascular: No lower extremity edema, non tender Skin: Warm dry intact with no signs of infection or rash on extremities or on axial skeleton. Abdomen: Soft nontender, no masses Neuro: Cranial nerves  intact, neurovascularly intact in all extremities with 2+ DTRs and 2+ pulses. Lymph: No lymphadenopathy appreciated today  Gait normal with good balance and coordination.  MSK: Non tender with full range of motion and good  stability and symmetric strength and tone of shoulders, elbows, wrist,  knee hips and ankles bilaterally.    Back exam shows that patient is tender to palpation in the paraspinal musculature of L4-S1 bilaterallyAnd previous exam. No pain in the lower extremeities. Neurovascular intact distally. Full strength.     Impression and Recommendations:     This case required medical decision making of moderate complexity.      Note: This dictation was prepared with Dragon dictation along with smaller phrase technology. Any transcriptional errors that result from this process are unintentional.

## 2015-12-04 ENCOUNTER — Encounter: Payer: Self-pay | Admitting: Family Medicine

## 2015-12-04 ENCOUNTER — Ambulatory Visit (INDEPENDENT_AMBULATORY_CARE_PROVIDER_SITE_OTHER): Payer: Medicare HMO | Admitting: Family Medicine

## 2015-12-04 DIAGNOSIS — M5136 Other intervertebral disc degeneration, lumbar region: Secondary | ICD-10-CM | POA: Diagnosis not present

## 2015-12-04 DIAGNOSIS — M79669 Pain in unspecified lower leg: Secondary | ICD-10-CM

## 2015-12-04 NOTE — Assessment & Plan Note (Signed)
Discussed with patient at great length. Patient is going to continue the iron supple mentation. Follow-up as needed.

## 2015-12-04 NOTE — Assessment & Plan Note (Signed)
Patient does have more of a degenerative disc disease and lumbar spine. I think this is been contributed minimal to the difficulty. We discussed icing regimen and home exercise. Patient come back and see me again on an as-needed basis as long as patient does well.

## 2015-12-11 ENCOUNTER — Encounter: Payer: Self-pay | Admitting: Internal Medicine

## 2015-12-11 ENCOUNTER — Ambulatory Visit (INDEPENDENT_AMBULATORY_CARE_PROVIDER_SITE_OTHER): Payer: Medicare HMO | Admitting: Internal Medicine

## 2015-12-11 ENCOUNTER — Other Ambulatory Visit (INDEPENDENT_AMBULATORY_CARE_PROVIDER_SITE_OTHER): Payer: Medicare HMO

## 2015-12-11 DIAGNOSIS — R413 Other amnesia: Secondary | ICD-10-CM

## 2015-12-11 DIAGNOSIS — R600 Localized edema: Secondary | ICD-10-CM

## 2015-12-11 DIAGNOSIS — Z0000 Encounter for general adult medical examination without abnormal findings: Secondary | ICD-10-CM

## 2015-12-11 DIAGNOSIS — I8002 Phlebitis and thrombophlebitis of superficial vessels of left lower extremity: Secondary | ICD-10-CM

## 2015-12-11 DIAGNOSIS — F411 Generalized anxiety disorder: Secondary | ICD-10-CM

## 2015-12-11 DIAGNOSIS — E119 Type 2 diabetes mellitus without complications: Secondary | ICD-10-CM

## 2015-12-11 DIAGNOSIS — I10 Essential (primary) hypertension: Secondary | ICD-10-CM | POA: Diagnosis not present

## 2015-12-11 DIAGNOSIS — M5440 Lumbago with sciatica, unspecified side: Secondary | ICD-10-CM

## 2015-12-11 DIAGNOSIS — J4520 Mild intermittent asthma, uncomplicated: Secondary | ICD-10-CM

## 2015-12-11 DIAGNOSIS — E538 Deficiency of other specified B group vitamins: Secondary | ICD-10-CM | POA: Diagnosis not present

## 2015-12-11 LAB — MICROALBUMIN / CREATININE URINE RATIO
CREATININE, U: 95 mg/dL
MICROALB/CREAT RATIO: 0.7 mg/g (ref 0.0–30.0)
Microalb, Ur: <0.7 mg/dL (ref 0.0–1.9)

## 2015-12-11 LAB — HEMOGLOBIN A1C: HEMOGLOBIN A1C: 5.7 % (ref 4.6–6.5)

## 2015-12-11 MED ORDER — FUROSEMIDE 20 MG PO TABS: 20.0000 mg | ORAL_TABLET | Freq: Every day | ORAL | 4 refills | Status: DC | PRN

## 2015-12-11 MED ORDER — CITALOPRAM HYDROBROMIDE 40 MG PO TABS
40.0000 mg | ORAL_TABLET | Freq: Every day | ORAL | 3 refills | Status: DC
Start: 2015-12-11 — End: 2017-08-15

## 2015-12-11 MED ORDER — METFORMIN HCL 500 MG PO TABS: 500.0000 mg | ORAL_TABLET | Freq: Two times a day (BID) | ORAL | 3 refills | Status: DC

## 2015-12-11 NOTE — Assessment & Plan Note (Signed)
On B12 

## 2015-12-11 NOTE — Assessment & Plan Note (Signed)
Lasix prn 

## 2015-12-11 NOTE — Assessment & Plan Note (Signed)
Discussed.

## 2015-12-11 NOTE — Progress Notes (Signed)
Pre visit review using our clinic review tool, if applicable. No additional management support is needed unless otherwise documented below in the visit note. 

## 2015-12-11 NOTE — Progress Notes (Signed)
Subjective:  Patient ID: Tiffany Gibbs, female    DOB: Aug 21, 1945  Age: 70 y.o. MRN: [id number]  CC: No chief complaint on file.   HPI Tiffany Gibbs presents for OA, wt gain, leg swelling, OA f/u  Outpatient Medications Prior to Visit  Medication Sig Dispense Refill  . aspirin EC 81 MG tablet Take 1 tablet (81 mg total) by mouth daily. 100 tablet 3  . B Complex-C (SUPER B COMPLEX PO) Take by mouth daily.    . Cholecalciferol (VITAMIN D) 1000 UNITS capsule Take 1 capsule (1,000 Units total) by mouth daily. 100 capsule 3  . ciclopirox (PENLAC) 8 % solution Apply topically at bedtime. Apply over nail and surrounding skin. Apply daily over previous coat. After seven (7) days, may remove with alcohol and continue cycle. 6.6 mL 3  . citalopram (CELEXA) 40 MG tablet Take 1 tablet (40 mg total) by mouth daily. 90 tablet 3  . clotrimazole-betamethasone (LOTRISONE) cream Apply 1 application topically 2 (two) times daily. (Patient taking differently: Apply 1 application topically as needed. ) 45 g 1  . diphenhydrAMINE (BENADRYL) 25 MG tablet Take 25 mg by mouth at bedtime as needed.     . fluticasone (FLONASE) 50 MCG/ACT nasal spray Place 1 spray into both nostrils daily as needed for allergies or rhinitis.    [name] ibuprofen (ADVIL,MOTRIN) 600 MG tablet Take 1 tablet (600 mg total) by mouth 2 (two) times daily as needed. 60 tablet 3  . loratadine (CLARITIN) 10 MG tablet Take 10 mg by mouth daily.    . metFORMIN (GLUCOPHAGE) 500 MG tablet Take 1 tablet (500 mg total) by mouth 2 (two) times daily with a meal. 180 tablet 3  . Omega-3 Fatty Acids (FISH OIL) 1200 MG CAPS Take 2,400 mg by mouth 2 (two) times daily.    [name] spironolactone (ALDACTONE) 50 MG tablet Take 1 tablet (50 mg total) by mouth daily. 90 tablet 3  . triamcinolone cream (KENALOG) 0.5 % Apply 1 application topically 3 (three) times daily as needed (to rash).    . gabapentin (NEURONTIN) 100 MG capsule Take 2 capsules (200 mg total) by mouth  at bedtime. (Patient not taking: Reported on 12/11/2015) 60 capsule 3   Facility-Administered Medications Prior to Visit  Medication Dose Route Frequency Provider Last Rate Last Dose  . 0.9 %  sodium chloride infusion  500 mL Intravenous Continuous Lajuan Lines Pyrtle, MD      . 0.9 %  sodium chloride infusion  500 mL Intravenous Continuous Jerene Bears, MD      . dextrose 5 % solution   Intravenous Continuous Jerene Bears, MD        ROS Review of Systems  Constitutional: Positive for unexpected weight change. Negative for activity change, appetite change, chills and fatigue.  HENT: Negative for congestion, mouth sores and sinus pressure.   Eyes: Negative for visual disturbance.  Respiratory: Negative for cough and chest tightness.   Cardiovascular: Positive for leg swelling.  Gastrointestinal: Negative for abdominal pain and nausea.  Genitourinary: Negative for difficulty urinating, frequency and vaginal pain.  Musculoskeletal: Positive for arthralgias. Negative for back pain and gait problem.  Skin: Negative for pallor and rash.  Neurological: Negative for dizziness, tremors, weakness, numbness and headaches.  Psychiatric/Behavioral: Negative for confusion and sleep disturbance.    Objective:  BP 118/60   Pulse 71   Wt 206 lb (93.4 kg)   SpO2 99%   BMI 35.92 kg/m   BP Readings from  Last 3 Encounters:  12/11/15 118/60  12/04/15 130/72  11/08/15 122/70    Wt Readings from Last 3 Encounters:  12/11/15 206 lb (93.4 kg)  12/04/15 201 lb (91.2 kg)  11/08/15 190 lb (86.2 kg)    Physical Exam  Constitutional: She appears well-developed. No distress.  HENT:  Head: Normocephalic.  Right Ear: External ear normal.  Left Ear: External ear normal.  Nose: Nose normal.  Mouth/Throat: Oropharynx is clear and moist.  Eyes: Conjunctivae are normal. Pupils are equal, round, and reactive to light. Right eye exhibits no discharge. Left eye exhibits no discharge.  Neck: Normal range of  motion. Neck supple. No JVD present. No tracheal deviation present. No thyromegaly present.  Cardiovascular: Normal rate, regular rhythm and normal heart sounds.   Pulmonary/Chest: No stridor. No respiratory distress. She has no wheezes.  Abdominal: Soft. Bowel sounds are normal. She exhibits no distension and no mass. There is no tenderness. There is no rebound and no guarding.  Musculoskeletal: She exhibits edema. She exhibits no tenderness.  Lymphadenopathy:    She has no cervical adenopathy.  Neurological: She displays normal reflexes. No cranial nerve deficit. She exhibits normal muscle tone. Coordination normal.  Skin: No rash noted. No erythema.  Psychiatric: She has a normal mood and affect. Her behavior is normal. Judgment and thought content normal.    Lab Results  Component Value Date   WBC 7.3 04/25/2015   HGB 13.6 04/25/2015   HCT 39.8 04/25/2015   PLT 241.0 04/25/2015   GLUCOSE 87 08/26/2015   CHOL 217 (H) 04/25/2015   TRIG 105.0 04/25/2015   HDL 50.60 04/25/2015   LDLDIRECT 148.2 02/03/2013   LDLCALC 146 (H) 04/25/2015   ALT 17 11/19/2014   AST 18 11/19/2014   NA 142 08/26/2015   K 3.9 08/26/2015   CL 106 08/26/2015   CREATININE 0.81 08/26/2015   BUN 16 08/26/2015   CO2 28 08/26/2015   TSH 2.01 11/19/2014   INR 1.14 11/21/2012   HGBA1C 5.8 08/26/2015    Dg Lumbar Spine Complete  Result Date: 11/08/2015 CLINICAL DATA:  Chronic low back pain, right hip and leg pain for 6 months. No known injury. EXAM: LUMBAR SPINE - COMPLETE 4+ VIEW COMPARISON:  None. FINDINGS: Degenerative facet disease in the lower lumbar spine. Disc spaces are maintained. No fracture. SI joints are symmetric and unremarkable. IMPRESSION: No acute findings. Degenerative facet disease in the lower lumbar spine. Electronically Signed   By: Rolm Baptise M.D.   On: 11/08/2015 11:40   [place]  Result Date: 11/12/2015 Limited musculoskeletal ultrasound was performed and  interpreted by Lyndal Pulley Limited ultrasound the patient's calves bilaterally did not show any significant abnormality. Patient does show some mild dehydration but otherwise unremarkable. Impression: Normal with mild dehydration   Assessment & Plan:   Diagnoses and all orders for this visit:  Hypertension, essential -     citalopram (CELEXA) 40 MG tablet; Take 1 tablet (40 mg total) by mouth daily. -     metFORMIN (GLUCOPHAGE) 500 MG tablet; Take 1 tablet (500 mg total) by mouth 2 (two) times daily with a meal.  Phlebitis of superficial vein of lower extremity, left -     citalopram (CELEXA) 40 MG tablet; Take 1 tablet (40 mg total) by mouth daily. -     metFORMIN (GLUCOPHAGE) 500 MG tablet; Take 1 tablet (500 mg total) by mouth 2 (two) times daily with a meal.  B12 deficiency -  citalopram (CELEXA) 40 MG tablet; Take 1 tablet (40 mg total) by mouth daily. -     metFORMIN (GLUCOPHAGE) 500 MG tablet; Take 1 tablet (500 mg total) by mouth 2 (two) times daily with a meal.  Type 2 diabetes mellitus without complication, without long-term current use of insulin (HCC) -     citalopram (CELEXA) 40 MG tablet; Take 1 tablet (40 mg total) by mouth daily. -     metFORMIN (GLUCOPHAGE) 500 MG tablet; Take 1 tablet (500 mg total) by mouth 2 (two) times daily with a meal.  Asthmatic bronchitis, mild intermittent, uncomplicated -     citalopram (CELEXA) 40 MG tablet; Take 1 tablet (40 mg total) by mouth daily. -     metFORMIN (GLUCOPHAGE) 500 MG tablet; Take 1 tablet (500 mg total) by mouth 2 (two) times daily with a meal.  Anxiety state -     citalopram (CELEXA) 40 MG tablet; Take 1 tablet (40 mg total) by mouth daily. -     metFORMIN (GLUCOPHAGE) 500 MG tablet; Take 1 tablet (500 mg total) by mouth 2 (two) times daily with a meal.  Well adult exam -     citalopram (CELEXA) 40 MG tablet; Take 1 tablet (40 mg total) by mouth daily. -     metFORMIN (GLUCOPHAGE) 500 MG tablet; Take 1 tablet  (500 mg total) by mouth 2 (two) times daily with a meal.  Memory difficulty -     citalopram (CELEXA) 40 MG tablet; Take 1 tablet (40 mg total) by mouth daily. -     metFORMIN (GLUCOPHAGE) 500 MG tablet; Take 1 tablet (500 mg total) by mouth 2 (two) times daily with a meal.   I am having Ms. Agcaoili maintain her Vitamin D, Fish Oil, diphenhydrAMINE, loratadine, fluticasone, triamcinolone cream, ciclopirox, aspirin EC, clotrimazole-betamethasone, metFORMIN, citalopram, ibuprofen, B Complex-C (SUPER B COMPLEX PO), spironolactone, gabapentin, amoxicillin, and Iron. We will continue to administer sodium chloride, sodium chloride, and dextrose.  Meds ordered this encounter  Medications  . amoxicillin (AMOXIL) 500 MG capsule    Sig: as needed.  . Ferrous Sulfate (IRON) 325 (65 Fe) MG TABS    Sig: Take 1 tablet by mouth 3 (three) times a week.     Follow-up: No Follow-up on file.  Walker Kehr, MD

## 2015-12-11 NOTE — Assessment & Plan Note (Signed)
Loose wt 

## 2015-12-11 NOTE — Assessment & Plan Note (Addendum)
Spironaolactone Furosemide prn AAA screen [place] ordered

## 2015-12-12 ENCOUNTER — Telehealth: Payer: Self-pay | Admitting: Internal Medicine

## 2015-12-12 NOTE — Telephone Encounter (Signed)
Patient is requesting labs to be mailed to her once resulted.

## 2015-12-13 NOTE — Telephone Encounter (Signed)
Labs mailed to pt

## 2015-12-23 ENCOUNTER — Ambulatory Visit
Admission: RE | Admit: 2015-12-23 | Discharge: 2015-12-23 | Disposition: A | Discharge status: Home / Self Care | Payer: Medicare HMO | Source: Ambulatory Visit | Attending: Internal Medicine | Admitting: Internal Medicine

## 2015-12-23 ENCOUNTER — Telehealth: Payer: Self-pay | Admitting: Internal Medicine

## 2015-12-23 DIAGNOSIS — E7849 Other hyperlipidemia: Secondary | ICD-10-CM

## 2015-12-23 DIAGNOSIS — I10 Essential (primary) hypertension: Secondary | ICD-10-CM

## 2015-12-23 DIAGNOSIS — Z136 Encounter for screening for cardiovascular disorders: Secondary | ICD-10-CM | POA: Diagnosis not present

## 2015-12-23 MED ORDER — PRAVASTATIN SODIUM 20 MG PO TABS: 20.0000 mg | ORAL_TABLET | Freq: Every day | ORAL | 11 refills | Status: DC

## 2015-12-23 NOTE — Telephone Encounter (Signed)
OK: we can try Pravastatin.I would not use Lovastatin I'll email the Rx for Pravachol 20 mg/d Lipids in 5-6 wks Thx

## 2015-12-23 NOTE — Telephone Encounter (Signed)
Patient is requesting to go back on lovastatin. States that she was on it 10 years ago and then had to come off due to leg cramping. She states that she is not having the leg cramping issues anymore and would like to go back on the medication. the last lipid panel was 04/2015  Please advise and mail rx to patient

## 2015-12-24 NOTE — Telephone Encounter (Signed)
Notified pt w/MD response. Place order to have lipid check 02/03/16...Johny Chess

## 2015-12-25 DIAGNOSIS — G4733 Obstructive sleep apnea (adult) (pediatric): Secondary | ICD-10-CM | POA: Diagnosis not present

## 2015-12-30 ENCOUNTER — Telehealth: Payer: Self-pay | Admitting: Internal Medicine

## 2015-12-30 DIAGNOSIS — G4733 Obstructive sleep apnea (adult) (pediatric): Secondary | ICD-10-CM | POA: Diagnosis not present

## 2015-12-30 MED ORDER — EZETIMIBE 10 MG PO TABS: 10.0000 mg | ORAL_TABLET | Freq: Every day | ORAL | 11 refills | Status: DC

## 2015-12-30 NOTE — Telephone Encounter (Signed)
OK - d/c Pravachol Start Zetia 1 a day Thx

## 2015-12-30 NOTE — Telephone Encounter (Signed)
Pt called in and said that the meds for her cholesterol is not working and makes her swell.  She would like to see if there is another med that he wants to call in.  She stop taking it

## 2015-12-31 NOTE — Telephone Encounter (Signed)
Notified pt w/MD response.../lmb 

## 2016-01-14 ENCOUNTER — Other Ambulatory Visit: Payer: Self-pay | Admitting: *Deleted

## 2016-01-14 MED ORDER — TRIAMCINOLONE ACETONIDE 0.5 % EX CREA: 1.0000 "application " | TOPICAL_CREAM | Freq: Three times a day (TID) | CUTANEOUS | 0 refills | Status: DC | PRN

## 2016-01-14 NOTE — Telephone Encounter (Signed)
Rec'd call pt states she no longer uses [facility] [place]. Also want rrefill on the triamcinolone cream. Inform pt will updated pharmacy and send refill...Johny Chess

## 2016-01-23 ENCOUNTER — Telehealth: Payer: Self-pay | Admitting: *Deleted

## 2016-01-23 MED ORDER — EZETIMIBE 10 MG PO TABS: 10.0000 mg | ORAL_TABLET | Freq: Every day | ORAL | 1 refills | Status: DC

## 2016-01-23 NOTE — Telephone Encounter (Signed)
Rec'd call pt states she is wantingt o get the generic script for Zetia sent to [place]. Inform pt the rx that was sent last month was for generic. She states med cost $62 for 30 day. ? If it would be cheaper with mail service. Inform pt I can send rx to [facility], and she can give them a call to see the price because MD wouldn't know. Pt states that would be great sent rx to [facility], also [facility]...Johny Chess

## 2016-01-25 DIAGNOSIS — G4733 Obstructive sleep apnea (adult) (pediatric): Secondary | ICD-10-CM | POA: Diagnosis not present

## 2016-01-31 DIAGNOSIS — G4733 Obstructive sleep apnea (adult) (pediatric): Secondary | ICD-10-CM | POA: Diagnosis not present

## 2016-02-05 ENCOUNTER — Other Ambulatory Visit (INDEPENDENT_AMBULATORY_CARE_PROVIDER_SITE_OTHER): Payer: Medicare HMO

## 2016-02-05 DIAGNOSIS — E784 Other hyperlipidemia: Secondary | ICD-10-CM | POA: Diagnosis not present

## 2016-02-05 DIAGNOSIS — E7849 Other hyperlipidemia: Secondary | ICD-10-CM

## 2016-02-05 LAB — LIPID PANEL
CHOL/HDL RATIO: 3
Cholesterol: 191 mg/dL (ref 0–200)
HDL: 56.2 mg/dL (ref >39.00)
LDL Cholesterol: 111 mg/dL — ABNORMAL HIGH (ref 0–99)
NONHDL: 135.17
Triglycerides: 122 mg/dL (ref 0.0–149.0)
VLDL: 24.4 mg/dL (ref 0.0–40.0)

## 2016-02-21 ENCOUNTER — Telehealth: Payer: Self-pay | Admitting: Neurology

## 2016-02-21 NOTE — Telephone Encounter (Signed)
Please call patient to make FU appt for sleep apnea. Any slot available okay for appt.

## 2016-02-21 NOTE — Telephone Encounter (Signed)
Called and LVM for pt to call office to set up f/u appt per Dr Rexene Alberts request.

## 2016-02-21 NOTE — Telephone Encounter (Signed)
Patient called office wanting to know if our office can see her CPAP compliance automatically with [employer] care?  Also wanting to know when she is to come back in for a FU visit.  Please call

## 2016-02-24 NOTE — Telephone Encounter (Signed)
Pt called said she is not having any problems, she was just making sure Dr Rexene Alberts could keep up with her readings. She understands it can be downloaded on line for the provider to see. Says she will make an appt if the provider thinks she should come in before her yearly appt.

## 2016-02-24 NOTE — Telephone Encounter (Signed)
I called pt. She has already make an appt for 06/15/16 with Dr. Rexene Alberts. Pt does not want to make a sooner appt. I advised her that Dr. Rexene Alberts is able to see her data online. Pt says that she really likes [employer]. Pt had no questions at this time but was encouraged to call back if questions arise.

## 2016-02-25 DIAGNOSIS — G4733 Obstructive sleep apnea (adult) (pediatric): Secondary | ICD-10-CM | POA: Diagnosis not present

## 2016-03-03 DIAGNOSIS — G4733 Obstructive sleep apnea (adult) (pediatric): Secondary | ICD-10-CM | POA: Diagnosis not present

## 2016-03-16 DIAGNOSIS — R69 Illness, unspecified: Secondary | ICD-10-CM | POA: Diagnosis not present

## 2016-03-24 DIAGNOSIS — G4733 Obstructive sleep apnea (adult) (pediatric): Secondary | ICD-10-CM | POA: Diagnosis not present

## 2016-04-01 ENCOUNTER — Ambulatory Visit (INDEPENDENT_AMBULATORY_CARE_PROVIDER_SITE_OTHER): Payer: Medicare HMO | Admitting: Internal Medicine

## 2016-04-01 ENCOUNTER — Other Ambulatory Visit (INDEPENDENT_AMBULATORY_CARE_PROVIDER_SITE_OTHER): Payer: Medicare HMO

## 2016-04-01 ENCOUNTER — Encounter: Payer: Self-pay | Admitting: Internal Medicine

## 2016-04-01 VITALS — BP 112/62 | HR 71 | Temp 98.3°F | Resp 16 | Ht 64.0 in | Wt 209.8 lb

## 2016-04-01 DIAGNOSIS — M720 Palmar fascial fibromatosis [Dupuytren]: Secondary | ICD-10-CM | POA: Diagnosis not present

## 2016-04-01 DIAGNOSIS — H538 Other visual disturbances: Secondary | ICD-10-CM | POA: Diagnosis not present

## 2016-04-01 DIAGNOSIS — E119 Type 2 diabetes mellitus without complications: Secondary | ICD-10-CM

## 2016-04-01 DIAGNOSIS — D582 Other hemoglobinopathies: Secondary | ICD-10-CM

## 2016-04-01 DIAGNOSIS — I10 Essential (primary) hypertension: Secondary | ICD-10-CM

## 2016-04-01 DIAGNOSIS — M797 Fibromyalgia: Secondary | ICD-10-CM | POA: Diagnosis not present

## 2016-04-01 DIAGNOSIS — E538 Deficiency of other specified B group vitamins: Secondary | ICD-10-CM | POA: Diagnosis not present

## 2016-04-01 LAB — BASIC METABOLIC PANEL
BUN: 18 mg/dL (ref 6–23)
CO2: 26 mEq/L (ref 19–32)
CREATININE: 0.99 mg/dL (ref 0.40–1.20)
Calcium: 9.8 mg/dL (ref 8.4–10.5)
Chloride: 103 mEq/L (ref 96–112)
GFR: 58.81 mL/min — ABNORMAL LOW (ref >60.00)
GLUCOSE: 99 mg/dL (ref 70–99)
POTASSIUM: 3.9 meq/L (ref 3.5–5.1)
Sodium: 138 mEq/L (ref 135–145)

## 2016-04-01 LAB — TSH: TSH: 1.83 u[IU]/mL (ref 0.35–4.50)

## 2016-04-01 LAB — HEMOGLOBIN A1C: HEMOGLOBIN A1C: 5.9 % (ref 4.6–6.5)

## 2016-04-01 MED ORDER — TRIAMCINOLONE ACETONIDE 0.5 % EX CREA: 1.0000 "application " | TOPICAL_CREAM | Freq: Three times a day (TID) | CUTANEOUS | 3 refills | Status: DC | PRN

## 2016-04-01 MED ORDER — FUROSEMIDE 40 MG PO TABS: 40.0000 mg | ORAL_TABLET | Freq: Every day | ORAL | 11 refills | Status: DC | PRN

## 2016-04-01 MED ORDER — FUROSEMIDE 40 MG PO TABS: 40.0000 mg | ORAL_TABLET | Freq: Every day | ORAL | 3 refills | Status: DC | PRN

## 2016-04-01 NOTE — Assessment & Plan Note (Signed)
On B12 

## 2016-04-01 NOTE — Progress Notes (Signed)
Subjective:  Patient ID: Tiffany Gibbs, female    DOB: 1945/12/05  Age: 71 y.o. MRN: [id number]  CC: Hypertension and Diabetes   HPI KERIANNE GURR presents for DM, HTN, dyslipidemia f/u  Outpatient Medications Prior to Visit  Medication Sig Dispense Refill  . amoxicillin (AMOXIL) 500 MG capsule as needed.    [name] aspirin EC 81 MG tablet Take 1 tablet (81 mg total) by mouth daily. 100 tablet 3  . B Complex-C (SUPER B COMPLEX PO) Take by mouth daily.    . Cholecalciferol (VITAMIN D) 1000 UNITS capsule Take 1 capsule (1,000 Units total) by mouth daily. 100 capsule 3  . citalopram (CELEXA) 40 MG tablet Take 1 tablet (40 mg total) by mouth daily. 90 tablet 3  . clotrimazole-betamethasone (LOTRISONE) cream Apply 1 application topically 2 (two) times daily. (Patient taking differently: Apply 1 application topically as needed. ) 45 g 1  . diphenhydrAMINE (BENADRYL) 25 MG tablet Take 25 mg by mouth at bedtime as needed.     . Ferrous Sulfate (IRON) 325 (65 Fe) MG TABS Take 1 tablet by mouth 3 (three) times a week.    . fluticasone (FLONASE) 50 MCG/ACT nasal spray Place 1 spray into both nostrils daily as needed for allergies or rhinitis.    . furosemide (LASIX) 20 MG tablet Take 1-2 tablets (20-40 mg total) by mouth daily as needed. 60 tablet 4  . ibuprofen (ADVIL,MOTRIN) 600 MG tablet Take 1 tablet (600 mg total) by mouth 2 (two) times daily as needed. 60 tablet 3  . loratadine (CLARITIN) 10 MG tablet Take 10 mg by mouth daily.    . metFORMIN (GLUCOPHAGE) 500 MG tablet Take 1 tablet (500 mg total) by mouth 2 (two) times daily with a meal. 180 tablet 3  . Omega-3 Fatty Acids (FISH OIL) 1200 MG CAPS Take 2,400 mg by mouth 2 (two) times daily.    [name] spironolactone (ALDACTONE) 50 MG tablet Take 1 tablet (50 mg total) by mouth daily. 90 tablet 3  . triamcinolone cream (KENALOG) 0.5 % Apply 1 application topically 3 (three) times daily as needed (to rash). 30 g 0  . ciclopirox (PENLAC) 8 % solution  Apply topically at bedtime. Apply over nail and surrounding skin. Apply daily over previous coat. After seven (7) days, may remove with alcohol and continue cycle. 6.6 mL 3  . ezetimibe (ZETIA) 10 MG tablet Take 1 tablet (10 mg total) by mouth daily. 90 tablet 1  . gabapentin (NEURONTIN) 100 MG capsule Take 2 capsules (200 mg total) by mouth at bedtime. (Patient not taking: Reported on 12/11/2015) 60 capsule 3   Facility-Administered Medications Prior to Visit  Medication Dose Route Frequency Provider Last Rate Last Dose  . 0.9 %  sodium chloride infusion  500 mL Intravenous Continuous Lajuan Lines Pyrtle, MD      . 0.9 %  sodium chloride infusion  500 mL Intravenous Continuous Jerene Bears, MD      . dextrose 5 % solution   Intravenous Continuous Jerene Bears, MD        ROS Review of Systems  Constitutional: Negative for activity change, appetite change, chills, fatigue and unexpected weight change.  HENT: Negative for congestion, mouth sores and sinus pressure.   Eyes: Negative for visual disturbance.  Respiratory: Negative for cough and chest tightness.   Gastrointestinal: Negative for abdominal pain and nausea.  Genitourinary: Negative for difficulty urinating, frequency and vaginal pain.  Musculoskeletal: Positive for back pain. Negative for  gait problem.  Skin: Negative for pallor and rash.  Neurological: Negative for dizziness, tremors, weakness, numbness and headaches.  Psychiatric/Behavioral: Negative for confusion and sleep disturbance.    Objective:  BP 112/62   Pulse 71   Temp 98.3 F (36.8 C) (Oral)   Resp 16   Ht 5\' 4"  (1.626 m)   Wt 209 lb 12 oz (95.1 kg)   SpO2 97%   BMI 36.00 kg/m   BP Readings from Last 3 Encounters:  04/01/16 112/62  12/11/15 118/60  12/04/15 130/72    Wt Readings from Last 3 Encounters:  04/01/16 209 lb 12 oz (95.1 kg)  12/11/15 206 lb (93.4 kg)  12/04/15 201 lb (91.2 kg)    Physical Exam  Constitutional: She appears well-developed. No  distress.  HENT:  Head: Normocephalic.  Right Ear: External ear normal.  Left Ear: External ear normal.  Nose: Nose normal.  Mouth/Throat: Oropharynx is clear and moist.  Eyes: Conjunctivae are normal. Pupils are equal, round, and reactive to light. Right eye exhibits no discharge. Left eye exhibits no discharge.  Neck: Normal range of motion. Neck supple. No JVD present. No tracheal deviation present. No thyromegaly present.  Cardiovascular: Normal rate, regular rhythm and normal heart sounds.   Pulmonary/Chest: No stridor. No respiratory distress. She has no wheezes.  Abdominal: Soft. Bowel sounds are normal. She exhibits no distension and no mass. There is no tenderness. There is no rebound and no guarding.  Musculoskeletal: She exhibits no edema or tenderness.  Lymphadenopathy:    She has no cervical adenopathy.  Neurological: She displays normal reflexes. No cranial nerve deficit. She exhibits normal muscle tone. Coordination normal.  Skin: No rash noted. No erythema.  Psychiatric: She has a normal mood and affect. Her behavior is normal. Judgment and thought content normal.    Lab Results  Component Value Date   WBC 7.3 04/25/2015   HGB 13.6 04/25/2015   HCT 39.8 04/25/2015   PLT 241.0 04/25/2015   GLUCOSE 87 08/26/2015   CHOL 191 02/05/2016   TRIG 122.0 02/05/2016   HDL 56.20 02/05/2016   LDLDIRECT 148.2 02/03/2013   LDLCALC 111 (H) 02/05/2016   ALT 17 11/19/2014   AST 18 11/19/2014   NA 142 08/26/2015   K 3.9 08/26/2015   CL 106 08/26/2015   CREATININE 0.81 08/26/2015   BUN 16 08/26/2015   CO2 28 08/26/2015   TSH 2.01 11/19/2014   INR 1.14 11/21/2012   HGBA1C 5.7 12/11/2015   MICROALBUR <0.7 12/11/2015    US Abdominal Aorta Screening Aaa  Result Date: 12/23/2015 CLINICAL DATA:  Screening EXAM: ULTRASOUND OF ABDOMINAL AORTA TECHNIQUE: Ultrasound examination of the abdominal aorta was performed to evaluate for abdominal aortic aneurysm. COMPARISON:  None.  FINDINGS: Abdominal Aorta No aneurysm identified. Maximum Diameter: 1.7 cm proximally IMPRESSION: No evidence of abdominal aortic aneurysm. Electronically Signed   By: Rolm Baptise M.D.   On: 12/23/2015 10:06    Assessment & Plan:   There are no diagnoses linked to this encounter. I have discontinued Ms. Bacote's ciclopirox, gabapentin, and ezetimibe. I am also having her maintain her Vitamin D, Fish Oil, diphenhydrAMINE, loratadine, fluticasone, aspirin EC, clotrimazole-betamethasone, ibuprofen, B Complex-C (SUPER B COMPLEX PO), spironolactone, amoxicillin, citalopram, metFORMIN, Iron, furosemide, and triamcinolone cream. We will continue to administer sodium chloride, sodium chloride, and dextrose.  No orders of the defined types were placed in this encounter.    Follow-up: No Follow-up on file.  Walker Kehr, MD

## 2016-04-01 NOTE — Assessment & Plan Note (Addendum)
Spironolactone - hold Furosemide - 40 mg

## 2016-04-01 NOTE — Assessment & Plan Note (Signed)
On Metformin Labs 

## 2016-04-01 NOTE — Progress Notes (Signed)
Pre-visit discussion using our clinic review tool. No additional management support is needed unless otherwise documented below in the visit note.  

## 2016-04-01 NOTE — Assessment & Plan Note (Signed)
CBC

## 2016-04-01 NOTE — Assessment & Plan Note (Signed)
Discussed.

## 2016-04-15 ENCOUNTER — Telehealth: Payer: Self-pay

## 2016-04-15 ENCOUNTER — Other Ambulatory Visit: Payer: Self-pay

## 2016-04-15 MED ORDER — ZOSTER VAC RECOMB ADJUVANTED 50 MCG/0.5ML IM SUSR: 0.5000 mL | Freq: Once | INTRAMUSCULAR | 1 refills | Status: DC

## 2016-04-15 NOTE — Telephone Encounter (Signed)
Per dr plotnikov---ok for patient to receive shingles vaccine, patient is medicare, [facility], per [facility]

## 2016-04-21 ENCOUNTER — Telehealth: Payer: Self-pay | Admitting: Internal Medicine

## 2016-04-21 ENCOUNTER — Other Ambulatory Visit: Payer: Self-pay

## 2016-04-21 MED ORDER — ZOSTER VAC RECOMB ADJUVANTED 50 MCG/0.5ML IM SUSR: 0.5000 mL | Freq: Once | INTRAMUSCULAR | 1 refills | Status: AC

## 2016-04-21 NOTE — Telephone Encounter (Signed)
[facility], was told it would be $42, did not get the Inj because of the charge. She was told her inusurance would cover the whole cost. She would like a new Rx sent to [facility] in [place], she thinks she can get a better deal there.

## 2016-04-21 NOTE — Telephone Encounter (Signed)
[facility], [facility]--patient advised

## 2016-04-24 DIAGNOSIS — G4733 Obstructive sleep apnea (adult) (pediatric): Secondary | ICD-10-CM | POA: Diagnosis not present

## 2016-05-04 DIAGNOSIS — G4730 Sleep apnea, unspecified: Secondary | ICD-10-CM | POA: Diagnosis not present

## 2016-05-04 DIAGNOSIS — H5213 Myopia, bilateral: Secondary | ICD-10-CM | POA: Diagnosis not present

## 2016-05-04 DIAGNOSIS — Z961 Presence of intraocular lens: Secondary | ICD-10-CM | POA: Diagnosis not present

## 2016-05-04 DIAGNOSIS — H35363 Drusen (degenerative) of macula, bilateral: Secondary | ICD-10-CM | POA: Diagnosis not present

## 2016-05-04 DIAGNOSIS — H04123 Dry eye syndrome of bilateral lacrimal glands: Secondary | ICD-10-CM | POA: Diagnosis not present

## 2016-05-04 DIAGNOSIS — E119 Type 2 diabetes mellitus without complications: Secondary | ICD-10-CM | POA: Diagnosis not present

## 2016-05-04 DIAGNOSIS — H43813 Vitreous degeneration, bilateral: Secondary | ICD-10-CM | POA: Diagnosis not present

## 2016-05-04 LAB — HM DIABETES EYE EXAM

## 2016-05-13 ENCOUNTER — Encounter: Payer: Self-pay | Admitting: Internal Medicine

## 2016-05-24 DIAGNOSIS — G4733 Obstructive sleep apnea (adult) (pediatric): Secondary | ICD-10-CM | POA: Diagnosis not present

## 2016-05-29 ENCOUNTER — Telehealth: Payer: Self-pay | Admitting: Internal Medicine

## 2016-05-29 NOTE — Telephone Encounter (Signed)
Pt called stating she need tier reduction for her Ezetimibe 10mg  for her insurance to cover it. And they need the medical reason why she needs it.  [phone]

## 2016-06-02 NOTE — Telephone Encounter (Signed)
Pt has called in again about this.  Can you call pt when you get a chance?

## 2016-06-03 MED ORDER — ZETIA 10 MG PO TABS: 10.0000 mg | ORAL_TABLET | Freq: Every day | ORAL | 1 refills | Status: DC

## 2016-06-03 NOTE — Telephone Encounter (Signed)
Pt states Zetia is a tier 3 and is the same price, she has wants [facility] to fill a form out for Ezetimibe to be moved to tier 1. Pt will be faxing the form over. They have to know why she needs this medicine and why it needs to be tier 1 or 2.

## 2016-06-03 NOTE — Telephone Encounter (Signed)
Brand name Chauncey Cruel is a lower tier so I sent that in to the pharmacy, LM notifying pt

## 2016-06-04 ENCOUNTER — Telehealth: Payer: Self-pay | Admitting: Neurology

## 2016-06-04 NOTE — Telephone Encounter (Signed)
I spoke to patient and advised her that we cannot addend note. I offered her sooner appt with NP.

## 2016-06-04 NOTE — Telephone Encounter (Signed)
Pt calling to inform she has an urgent request, she has received no supplies from [facility] . Pt said that Dr Rexene Alberts needs to do an addendum to Oct 25 2015 note has to say Tiffany Gibbs is compliant and benefiting from therapy.  Pt said she is leaking and needs this resolved asap.

## 2016-06-08 ENCOUNTER — Encounter: Payer: Self-pay | Admitting: Nurse Practitioner

## 2016-06-09 ENCOUNTER — Ambulatory Visit (INDEPENDENT_AMBULATORY_CARE_PROVIDER_SITE_OTHER): Payer: Medicare HMO | Admitting: Nurse Practitioner

## 2016-06-09 ENCOUNTER — Encounter: Payer: Self-pay | Admitting: Nurse Practitioner

## 2016-06-09 VITALS — BP 113/70 | HR 72 | Ht 64.0 in | Wt 200.0 lb

## 2016-06-09 DIAGNOSIS — Z9989 Dependence on other enabling machines and devices: Secondary | ICD-10-CM | POA: Diagnosis not present

## 2016-06-09 DIAGNOSIS — G4733 Obstructive sleep apnea (adult) (pediatric): Secondary | ICD-10-CM

## 2016-06-09 NOTE — Progress Notes (Addendum)
[facility]  PATIENT: Tiffany Gibbs DOB: 06-27-1945   REASON FOR VISIT: Follow-up for obstructive sleep apnea with CPAP HISTORY FROM: Patient   HPI UPDATE 06/09/16 CMMs. [name], 71 year old female returns for follow-up with history of obstructive sleep apnea using CPAP.  Her compliance data for greater than 4 hours is 100% for 30 days . Average usage 8 hours 7 minutes set pressure 11 cm EPR 3 AHI 0.5. She needs new supplies mask headpiece and toes. ESS score today is 3. She returns for reevaluation   05/08/15 SAReevaluation of her prior diagnosis of OSA. The patient is unaccompanied today. As you know, Tiffany Gibbs is a 71 year old right-handed woman with an underlying medical history of anxiety, depression, hypertension, cataracts, osteoarthritis, type 2 diabetes, low back pain, osteoporosis, hyperlipidemia, fibromyalgia, status post right total hip arthroplasty, abdominal hysterectomy, and obesity, who was previously diagnosed with obstructive sleep apnea many years ago, over 10 years ago. She had a sleep study at [facility] on 03/22/2001 which was interpreted by Dr. Danton Sewer. I reviewed the test results: Overall AHI was 68 per hour, REM sleep was not achieved. Lowest oxygen saturation was 87%. She had a CPAP titration study on 06/02/2001 also with on hospital. CPAP was titrated to 10 cm.She has to take care of her husband who is 16 years older and suffers from dementia and other physical disabilities   REVIEW OF SYSTEMS: Full 14 system review of systems performed and notable only for those listed, all others are neg:  Constitutional: neg  Cardiovascular: neg Ear/Nose/Throat: neg  Skin: neg Eyes: Light sensitivity Respiratory: neg Gastroitestinal: neg  Hematology/Lymphatic: neg  Endocrine: neg Musculoskeletal:neg Allergy/Immunology: neg Neurological: neg Psychiatric: neg Sleep : Obstructive sleep apnea with CPAP ALLERGIES: Allergies  Allergen Reactions  .  Celecoxib     swell  . Enalapril     cough  . Furosemide     cramps  . Losartan     falls  . Lovastatin     REACTION: aches  . Naproxen   . Pravachol [Pravastatin Sodium]     Nausea     HOME MEDICATIONS: Outpatient Medications Prior to Visit  Medication Sig Dispense Refill  . amoxicillin (AMOXIL) 500 MG capsule as needed.    [name] aspirin EC 81 MG tablet Take 1 tablet (81 mg total) by mouth daily. 100 tablet 3  . B Complex-C (SUPER B COMPLEX PO) Take by mouth daily.    . Cholecalciferol (VITAMIN D) 1000 UNITS capsule Take 1 capsule (1,000 Units total) by mouth daily. 100 capsule 3  . citalopram (CELEXA) 40 MG tablet Take 1 tablet (40 mg total) by mouth daily. 90 tablet 3  . clotrimazole-betamethasone (LOTRISONE) cream Apply 1 application topically 2 (two) times daily. (Patient taking differently: Apply 1 application topically as needed. ) 45 g 1  . diphenhydrAMINE (BENADRYL) 25 MG tablet Take 25 mg by mouth at bedtime as needed.     . Ferrous Sulfate (IRON) 325 (65 Fe) MG TABS Take 1 tablet by mouth 3 (three) times a week.    . fluticasone (FLONASE) 50 MCG/ACT nasal spray Place 1 spray into both nostrils daily as needed for allergies or rhinitis.    . furosemide (LASIX) 40 MG tablet Take 1 tablet (40 mg total) by mouth daily as needed. 90 tablet 3  . ibuprofen (ADVIL,MOTRIN) 600 MG tablet Take 1 tablet (600 mg total) by mouth 2 (two) times daily as needed. 60 tablet 3  . loratadine (CLARITIN) 10 MG tablet  Take 10 mg by mouth daily.    . metFORMIN (GLUCOPHAGE) 500 MG tablet Take 1 tablet (500 mg total) by mouth 2 (two) times daily with a meal. 180 tablet 3  . Omega-3 Fatty Acids (FISH OIL) 1200 MG CAPS Take 2,400 mg by mouth 2 (two) times daily.    [name] spironolactone (ALDACTONE) 50 MG tablet Take 1 tablet (50 mg total) by mouth daily. 90 tablet 3  . triamcinolone cream (KENALOG) 0.5 % Apply 1 application topically 3 (three) times daily as needed (to rash). 90 g 3  . ZETIA 10 MG tablet  Take 1 tablet (10 mg total) by mouth daily. (Patient not taking: Reported on 06/09/2016) 90 tablet 1   Facility-Administered Medications Prior to Visit  Medication Dose Route Frequency Provider Last Rate Last Dose  . 0.9 %  sodium chloride infusion  500 mL Intravenous Continuous Pyrtle, Lajuan Lines, MD      . 0.9 %  sodium chloride infusion  500 mL Intravenous Continuous Pyrtle, Lajuan Lines, MD      . dextrose 5 % solution   Intravenous Continuous Pyrtle, Lajuan Lines, MD        PAST MEDICAL HISTORY: Past Medical History:  Diagnosis Date  . Anxiety   . Cataract   . Depression   . DM type 2 (diabetes mellitus, type 2) ([place])   . Fibromyalgia   . H/O dizziness   . Hives    from tomatoes  . HTN (hypertension)   . Hyperlipidemia   . Low back pain   . OSA (obstructive sleep apnea)    uses c-pap  . Osteoarthritis   . Osteoporosis   . SOB (shortness of breath) on exertion     PAST SURGICAL HISTORY: Past Surgical History:  Procedure Laterality Date  . cervical cryotherapy     cervix  . EYE SURGERY     Cataracts/Bil  . LASIK Bilateral   . TOTAL ABDOMINAL HYSTERECTOMY    . TOTAL HIP ARTHROPLASTY  09/2008   right- [place]/  . TOTAL HIP ARTHROPLASTY Left 11/28/2012   Procedure: TOTAL HIP ARTHROPLASTY;  Surgeon: Kerin Salen, MD;  Location: [facility];  Service: Orthopedics;  Laterality: Left;    FAMILY HISTORY: Family History  Problem Relation Age of Onset  . Hypertension Mother   . Diabetes Mother   . Heart disease Father   . Multiple sclerosis Father   . Heart attack Father     SOCIAL HISTORY: Social History   Social History  . Marital status: Married    Spouse name: Sam  . Number of children: 1  . Years of education: 2    Occupational History  . N/A    Social History Main Topics  . Smoking status: Former Smoker    Packs/day: 2.00    Years: 25.00    Types: Cigarettes    Quit date: 01/13/1983  . Smokeless tobacco: Never Used  . Alcohol use No  . Drug use: No  . Sexual activity:  Yes   Other Topics Concern  . Not on file   Social History Narrative   Was divorced - newly re-married as of 2009, Sam   1 son      Caffeine use- coffee 1 cup daily         PHYSICAL EXAM  Vitals:   06/09/16 1244  BP: 113/70  Pulse: 72  Weight: 200 lb (90.7 kg)  Height: 5\' 4"  (1.626 m)   Body mass index is 34.33 kg/m.  Generalized: Well  developed, Obese female in no acute distress  Head: normocephalic and atraumatic,. Oropharynx benign  Neck: Supple,  Musculoskeletal: No deformity   Neurological examination   Mentation: Alert oriented to time, place, history taking. Attention span and concentration appropriate. Recent and remote memory intact.  Follows all commands speech and language fluent. ESS 3.  Cranial nerve II-XII: Pupils were equal round reactive to light extraocular movements were full, visual field were full on confrontational test. Facial sensation and strength were normal. hearing was intact to finger rubbing bilaterally. Uvula tongue midline. head turning and shoulder shrug were normal and symmetric.Tongue protrusion into cheek strength was normal. Motor: normal bulk and tone, full strength in the BUE, BLE, fine finger movements normal, no pronator drift. No focal weakness Sensory: normal and symmetric to light touch, in the upper and lower extremities  Coordination: finger-nose-finger, heel-to-shin bilaterally, no dysmetria Reflexes: Symmetric upper and lower, plantar responses were flexor bilaterally. Gait and Station: Rising up from seated position without assistance, normal stance,  moderate stride, good arm swing, smooth turning, able to perform tiptoe, and heel walking without difficulty. Tandem gait is steady. No assistive device  DIAGNOSTIC DATA (LABS, IMAGING, TESTING) - I reviewed patient records, labs, notes, testing and imaging myself where available.  Lab Results  Component Value Date   WBC 7.3 04/25/2015   HGB 13.6 04/25/2015   HCT 39.8  04/25/2015   MCV 87.0 04/25/2015   PLT 241.0 04/25/2015      Component Value Date/Time   NA 138 04/01/2016 1048   K 3.9 04/01/2016 1048   CL 103 04/01/2016 1048   CO2 26 04/01/2016 1048   GLUCOSE 99 04/01/2016 1048   BUN 18 04/01/2016 1048   CREATININE 0.99 04/01/2016 1048   CALCIUM 9.8 04/01/2016 1048   PROT 8.0 11/19/2014 1347   ALBUMIN 4.7 11/19/2014 1347   AST 18 11/19/2014 1347   ALT 17 11/19/2014 1347   ALKPHOS 57 11/19/2014 1347   BILITOT 0.7 11/19/2014 1347   GFRNONAA 73 (L) 11/29/2012 0725   GFRAA 85 (L) 11/29/2012 0725   Lab Results  Component Value Date   CHOL 191 02/05/2016   HDL 56.20 02/05/2016   LDLCALC 111 (H) 02/05/2016   LDLDIRECT 148.2 02/03/2013   TRIG 122.0 02/05/2016   CHOLHDL 3 02/05/2016   Lab Results  Component Value Date   HGBA1C 5.9 04/01/2016   Lab Results  Component Value Date   VITAMINB12 >1500 (H) 11/19/2014   Lab Results  Component Value Date   TSH 1.83 04/01/2016      ASSESSMENT AND PLAN  71 y.o. year old female  has a past medical history of ; OSA (obstructive sleep apnea); Here to follow-up for CPAP compliance.Her compliance data for greater than 4 hours is 100% for 30 days . Average usage 8 hours 7 minutes set pressure 11 cm EPR 3 AHI 0.5.   PLAN: CPAP compliance is excellent Will order supplies Follow-up yearly and when necessary as necessary. Dennie Bible, [facility], [facility], APRN  [facility] [address] [place],  [address] [phone]  I reviewed the above note and documentation by the Nurse Practitioner and agree with the history, physical exam, assessment and plan as outlined above. I was immediately available for face-to-face consultation. Star Age, MD, PhD [facility])

## 2016-06-09 NOTE — Patient Instructions (Signed)
CPAP compliance is excellent Will order supplies Follow-up yearly.

## 2016-06-09 NOTE — Telephone Encounter (Signed)
Pt would like to know if you received this fax.  Please call back.

## 2016-06-09 NOTE — Telephone Encounter (Signed)
LM notifying pt we have received form and im just waiting to receive back from [name].

## 2016-06-10 NOTE — Telephone Encounter (Signed)
Pt is upset that a medication was called in without her permission ZETIA 10 MG tablet [[id number]] She received a bill of $121 dollars. She does not think you were authorized to send in a medication she did not want. She wants you to make sure you know that you need to follow instructions. She wants to make sure you know you have caused her a headache. She said to stop making decisions based on your opinions and make it based on the patient opinions.

## 2016-06-11 NOTE — Telephone Encounter (Signed)
Pt states she saw NP Hoyle Sauer on yesterday and she was assured that everything would be sent over to [facility] in order for her to get the supplies needed.  Pt said she contacted [facility] and she is being told that they have yet to receive the documents needed to proceed in order to send her the supplies that she needs. Pt is asking that if the information has not be sent please do so as soon as possible and call to let her know

## 2016-06-11 NOTE — Telephone Encounter (Signed)
Spoke to [place] at [facility].  They did receive the order for cpap supplies and ofv note.  She stated that they needed this to update record for insurance.   To be reviewed this afternon or tomorrow and hopefully supplies next week.  I relayed to pt.  She will follow up next week.

## 2016-06-15 ENCOUNTER — Ambulatory Visit: Payer: Medicare HMO | Admitting: Neurology

## 2016-06-18 DIAGNOSIS — G4733 Obstructive sleep apnea (adult) (pediatric): Secondary | ICD-10-CM | POA: Diagnosis not present

## 2016-06-22 ENCOUNTER — Encounter: Payer: Self-pay | Admitting: Internal Medicine

## 2016-06-22 ENCOUNTER — Ambulatory Visit (INDEPENDENT_AMBULATORY_CARE_PROVIDER_SITE_OTHER): Payer: Medicare HMO | Admitting: Internal Medicine

## 2016-06-22 DIAGNOSIS — F411 Generalized anxiety disorder: Secondary | ICD-10-CM | POA: Diagnosis not present

## 2016-06-22 DIAGNOSIS — E119 Type 2 diabetes mellitus without complications: Secondary | ICD-10-CM

## 2016-06-22 DIAGNOSIS — I10 Essential (primary) hypertension: Secondary | ICD-10-CM | POA: Diagnosis not present

## 2016-06-22 DIAGNOSIS — R69 Illness, unspecified: Secondary | ICD-10-CM | POA: Diagnosis not present

## 2016-06-22 DIAGNOSIS — E538 Deficiency of other specified B group vitamins: Secondary | ICD-10-CM

## 2016-06-22 DIAGNOSIS — E785 Hyperlipidemia, unspecified: Secondary | ICD-10-CM

## 2016-06-22 DIAGNOSIS — M720 Palmar fascial fibromatosis [Dupuytren]: Secondary | ICD-10-CM

## 2016-06-22 DIAGNOSIS — R202 Paresthesia of skin: Secondary | ICD-10-CM

## 2016-06-22 MED ORDER — ZOSTER VAC RECOMB ADJUVANTED 50 MCG/0.5ML IM SUSR: 0.5000 mL | Freq: Once | INTRAMUSCULAR | 1 refills | Status: AC

## 2016-06-22 NOTE — Progress Notes (Signed)
Subjective:  Patient ID: Tiffany Gibbs, female    DOB: 1945/05/30  Age: 71 y.o. MRN: [id number]  CC: No chief complaint on file.   HPI JENEE SPAUGH presents for anxiety, HTN, dyslipidemia f/u  Zetia is too $$$ C/o B hands and arms numbness in am x long time...  Outpatient Medications Prior to Visit  Medication Sig Dispense Refill  . amoxicillin (AMOXIL) 500 MG capsule as needed.    [name] aspirin EC 81 MG tablet Take 1 tablet (81 mg total) by mouth daily. 100 tablet 3  . B Complex-C (SUPER B COMPLEX PO) Take by mouth daily.    . Cholecalciferol (VITAMIN D) 1000 UNITS capsule Take 1 capsule (1,000 Units total) by mouth daily. 100 capsule 3  . citalopram (CELEXA) 40 MG tablet Take 1 tablet (40 mg total) by mouth daily. 90 tablet 3  . clotrimazole-betamethasone (LOTRISONE) cream Apply 1 application topically 2 (two) times daily. (Patient taking differently: Apply 1 application topically as needed. ) 45 g 1  . diphenhydrAMINE (BENADRYL) 25 MG tablet Take 25 mg by mouth at bedtime as needed.     . Ferrous Sulfate (IRON) 325 (65 Fe) MG TABS Take 1 tablet by mouth 3 (three) times a week.    . fluticasone (FLONASE) 50 MCG/ACT nasal spray Place 1 spray into both nostrils daily as needed for allergies or rhinitis.    . furosemide (LASIX) 40 MG tablet Take 1 tablet (40 mg total) by mouth daily as needed. 90 tablet 3  . ibuprofen (ADVIL,MOTRIN) 600 MG tablet Take 1 tablet (600 mg total) by mouth 2 (two) times daily as needed. 60 tablet 3  . loratadine (CLARITIN) 10 MG tablet Take 10 mg by mouth daily.    . metFORMIN (GLUCOPHAGE) 500 MG tablet Take 1 tablet (500 mg total) by mouth 2 (two) times daily with a meal. 180 tablet 3  . Omega-3 Fatty Acids (FISH OIL) 1200 MG CAPS Take 2,400 mg by mouth 2 (two) times daily.    [name] spironolactone (ALDACTONE) 50 MG tablet Take 1 tablet (50 mg total) by mouth daily. 90 tablet 3  . triamcinolone cream (KENALOG) 0.5 % Apply 1 application topically 3 (three) times  daily as needed (to rash). 90 g 3  . ZETIA 10 MG tablet Take 1 tablet (10 mg total) by mouth daily. (Patient not taking: Reported on 06/09/2016) 90 tablet 1   Facility-Administered Medications Prior to Visit  Medication Dose Route Frequency Provider Last Rate Last Dose  . 0.9 %  sodium chloride infusion  500 mL Intravenous Continuous Pyrtle, Lajuan Lines, MD      . 0.9 %  sodium chloride infusion  500 mL Intravenous Continuous Pyrtle, Lajuan Lines, MD      . dextrose 5 % solution   Intravenous Continuous Pyrtle, Lajuan Lines, MD        ROS Review of Systems  Constitutional: Negative for activity change, appetite change, chills, fatigue and unexpected weight change.  HENT: Negative for congestion, mouth sores and sinus pressure.   Eyes: Negative for visual disturbance.  Respiratory: Negative for cough and chest tightness.   Gastrointestinal: Negative for abdominal pain and nausea.  Genitourinary: Negative for difficulty urinating, frequency and vaginal pain.  Musculoskeletal: Negative for back pain and gait problem.  Skin: Negative for pallor and rash.  Neurological: Negative for dizziness, tremors, weakness, numbness and headaches.  Psychiatric/Behavioral: Negative for confusion and sleep disturbance. The patient is nervous/anxious.     Objective:  BP 124/68 (BP  Location: Left Arm, Patient Position: Sitting, Cuff Size: Large)   Pulse 62   Temp 98.2 F (36.8 C) (Oral)   Ht 5\' 4"  (1.626 m)   Wt 199 lb (90.3 kg)   SpO2 98%   BMI 34.16 kg/m   BP Readings from Last 3 Encounters:  06/22/16 124/68  06/09/16 113/70  04/01/16 112/62    Wt Readings from Last 3 Encounters:  06/22/16 199 lb (90.3 kg)  06/09/16 200 lb (90.7 kg)  04/01/16 209 lb 12 oz (95.1 kg)    Physical Exam  Constitutional: She appears well-developed. No distress.  HENT:  Head: Normocephalic.  Right Ear: External ear normal.  Left Ear: External ear normal.  Nose: Nose normal.  Mouth/Throat: Oropharynx is clear and moist.    Eyes: Conjunctivae are normal. Pupils are equal, round, and reactive to light. Right eye exhibits no discharge. Left eye exhibits no discharge.  Neck: Normal range of motion. Neck supple. No JVD present. No tracheal deviation present. No thyromegaly present.  Cardiovascular: Normal rate, regular rhythm and normal heart sounds.   Pulmonary/Chest: No stridor. No respiratory distress. She has no wheezes.  Abdominal: Soft. Bowel sounds are normal. She exhibits no distension and no mass. There is no tenderness. There is no rebound and no guarding.  Musculoskeletal: She exhibits no edema or tenderness.  Lymphadenopathy:    She has no cervical adenopathy.  Neurological: She displays normal reflexes. No cranial nerve deficit. She exhibits normal muscle tone. Coordination normal.  Skin: No rash noted. No erythema.  Psychiatric: She has a normal mood and affect. Her behavior is normal. Judgment and thought content normal.    Lab Results  Component Value Date   WBC 7.3 04/25/2015   HGB 13.6 04/25/2015   HCT 39.8 04/25/2015   PLT 241.0 04/25/2015   GLUCOSE 99 04/01/2016   CHOL 191 02/05/2016   TRIG 122.0 02/05/2016   HDL 56.20 02/05/2016   LDLDIRECT 148.2 02/03/2013   LDLCALC 111 (H) 02/05/2016   ALT 17 11/19/2014   AST 18 11/19/2014   NA 138 04/01/2016   K 3.9 04/01/2016   CL 103 04/01/2016   CREATININE 0.99 04/01/2016   BUN 18 04/01/2016   CO2 26 04/01/2016   TSH 1.83 04/01/2016   INR 1.14 11/21/2012   HGBA1C 5.9 04/01/2016   MICROALBUR <0.7 12/11/2015    US Abdominal Aorta Screening Aaa  Result Date: 12/23/2015 CLINICAL DATA:  Screening EXAM: ULTRASOUND OF ABDOMINAL AORTA TECHNIQUE: Ultrasound examination of the abdominal aorta was performed to evaluate for abdominal aortic aneurysm. COMPARISON:  None. FINDINGS: Abdominal Aorta No aneurysm identified. Maximum Diameter: 1.7 cm proximally IMPRESSION: No evidence of abdominal aortic aneurysm. Electronically Signed   By: Rolm Baptise  M.D.   On: 12/23/2015 10:06    Assessment & Plan:   There are no diagnoses linked to this encounter. I have discontinued Ms. Kraker's ZETIA. I am also having her maintain her Vitamin D, Fish Oil, diphenhydrAMINE, loratadine, fluticasone, aspirin EC, clotrimazole-betamethasone, ibuprofen, B Complex-C (SUPER B COMPLEX PO), spironolactone, amoxicillin, citalopram, metFORMIN, Iron, furosemide, and triamcinolone cream. We will continue to administer sodium chloride, sodium chloride, and dextrose.  No orders of the defined types were placed in this encounter.    Follow-up: No Follow-up on file.  Walker Kehr, MD

## 2016-06-22 NOTE — Assessment & Plan Note (Signed)
On Vit E

## 2016-06-22 NOTE — Assessment & Plan Note (Signed)
On B12 

## 2016-06-22 NOTE — Assessment & Plan Note (Signed)
Spironaolactone Furosemide prn 

## 2016-06-22 NOTE — Assessment & Plan Note (Signed)
Poss cervical neuropathy - MRI 2006 MRI or f/u w/Dr Tish Frederickson

## 2016-06-22 NOTE — Assessment & Plan Note (Signed)
On Celexa 

## 2016-06-22 NOTE — Addendum Note (Signed)
Addended by: Cassandria Anger on: 06/22/2016 02:54 PM   Modules accepted: Orders

## 2016-06-22 NOTE — Assessment & Plan Note (Addendum)
On Metformin Labs 

## 2016-06-23 ENCOUNTER — Ambulatory Visit: Payer: Medicare HMO | Admitting: Neurology

## 2016-06-23 ENCOUNTER — Ambulatory Visit: Payer: Medicare HMO | Admitting: Internal Medicine

## 2016-06-24 DIAGNOSIS — G4733 Obstructive sleep apnea (adult) (pediatric): Secondary | ICD-10-CM | POA: Diagnosis not present

## 2016-06-26 ENCOUNTER — Telehealth: Payer: Self-pay

## 2016-06-26 MED ORDER — EZETIMIBE 10 MG PO TABS: 10.0000 mg | ORAL_TABLET | Freq: Every day | ORAL | 1 refills | Status: DC

## 2016-06-26 NOTE — Telephone Encounter (Signed)
RX sent for Ezetimibe

## 2016-06-26 NOTE — Telephone Encounter (Signed)
Pt informed, got letter about tier exception for Ezetimibe(Zetia).  Ezetimibe was approved from 01/12/16-12-31/18 Zetia was denied.

## 2016-07-20 DIAGNOSIS — G4733 Obstructive sleep apnea (adult) (pediatric): Secondary | ICD-10-CM | POA: Diagnosis not present

## 2016-07-24 DIAGNOSIS — G4733 Obstructive sleep apnea (adult) (pediatric): Secondary | ICD-10-CM | POA: Diagnosis not present

## 2016-08-17 DIAGNOSIS — A932 Colorado tick fever: Secondary | ICD-10-CM | POA: Diagnosis not present

## 2016-08-18 ENCOUNTER — Telehealth: Payer: Self-pay | Admitting: Internal Medicine

## 2016-08-18 ENCOUNTER — Encounter: Payer: Self-pay | Admitting: Family

## 2016-08-18 ENCOUNTER — Ambulatory Visit (INDEPENDENT_AMBULATORY_CARE_PROVIDER_SITE_OTHER): Payer: Medicare HMO | Admitting: Family

## 2016-08-18 VITALS — BP 132/62 | HR 65 | Temp 98.0°F | Resp 16 | Ht 64.0 in | Wt 198.1 lb

## 2016-08-18 DIAGNOSIS — W57XXXA Bitten or stung by nonvenomous insect and other nonvenomous arthropods, initial encounter: Secondary | ICD-10-CM

## 2016-08-18 DIAGNOSIS — S30860A Insect bite (nonvenomous) of lower back and pelvis, initial encounter: Secondary | ICD-10-CM | POA: Diagnosis not present

## 2016-08-18 NOTE — Patient Instructions (Signed)
Thank you for choosing [facility].  SUMMARY AND INSTRUCTIONS:  Please continue to take the doxycyline as prescribed.  Cleanse with soap and water. Cover as needed.  Continue to monitor for fevers, chills, joint pains, or bullseye type rash.   Follow up:  If your symptoms worsen or fail to improve, please contact our office for further instruction, or in case of emergency go directly to the emergency room at the closest medical facility.    Tick Bite Information Introduction Ticks are insects that attach themselves to the skin. There are many types of ticks. Common types include wood ticks and deer ticks. Sometimes, ticks carry diseases that can make a person very ill. The most common places for ticks to attach themselves are the scalp, neck, armpits, waist, and groin. HOW CAN YOU PREVENT TICK BITES? Take these steps to help prevent tick bites when you are outdoors:  Wear long sleeves and long pants.  Wear white clothes so you can see ticks more easily.  Tuck your pant legs into your socks.  If walking on a trail, stay in the middle of the trail to avoid brushing against bushes.  Avoid walking through areas with long grass.  Put bug spray on all skin that is showing and along boot tops, pant legs, and sleeve cuffs.  Check clothes, hair, and skin often and before going inside.  Brush off any ticks that are not attached.  Take a shower or bath as soon as possible after being outdoors.  HOW SHOULD YOU REMOVE A TICK? Ticks should be removed as soon as possible to help prevent diseases. 1. If latex gloves are available, put them on before trying to remove a tick. 2. Use tweezers to grasp the tick as close to the skin as possible. You may also use curved forceps or a tick removal tool. Grasp the tick as close to its head as possible. Avoid grasping the tick on its body. 3. Pull gently upward until the tick lets go. Do not twist the tick or jerk it suddenly. This may break  off the tick's head or mouth parts. 4. Do not squeeze or crush the tick's body. This could force disease-carrying fluids from the tick into your body. 5. After the tick is removed, wash the bite area and your hands with soap and water or alcohol. 6. Apply a small amount of antiseptic cream or ointment to the bite site. 7. Wash any tools that were used.  Do not try to remove a tick by applying a hot match, petroleum jelly, or fingernail polish to the tick. These methods do not work. They may also increase the chances of disease being spread from the tick bite. WHEN SHOULD YOU SEEK HELP? Contact your health care provider if you are unable to remove a tick or if a part of the tick breaks off in the skin. After a tick bite, you need to watch for signs and symptoms of diseases that can be spread by ticks. Contact your health care provider if you develop any of the following:  Fever.  Rash.  Redness and puffiness (swelling) in the area of the tick bite.  Tender, puffy lymph glands.  Watery poop (diarrhea).  Weight loss.  Cough.  Feeling more tired than normal (fatigue).  Muscle, joint, or bone pain.  Belly (abdominal) pain.  Headache.  Change in your level of consciousness.  Trouble walking or moving your legs.  Loss of feeling (numbness) in the legs.  Loss of movement (paralysis).  Shortness of breath.  Confusion.  Throwing up (vomiting) many times.  This information is not intended to replace advice given to you by your health care provider. Make sure you discuss any questions you have with your health care provider. Document Released: 03/25/2009 Document Revised: 06/06/2015 Document Reviewed: 06/08/2012 Elsevier Interactive Patient Education  [employer].

## 2016-08-18 NOTE — Telephone Encounter (Signed)
[facility] - Client [place] [facility] Patient Name: Tiffany Gibbs DOB: [birth year] Initial Comment Caller states she got a tic bite August 4 and she states it is still alive but not on her. She needs to know what to do. Nurse Assessment Nurse: Markus Daft, RN, Sherre Poot Date/Time (Eastern Time): 08/18/2016 8:03:54 AM Confirm and document reason for call. If symptomatic, describe symptoms. ---Caller states she got a tic bite August 4 - found on her back at 4:30 pm. Removed him that day. She has a red ring bull's eye on the area. She states it is still alive but not on her. Does the patient have any new or worsening symptoms? ---Yes Will a triage be completed? ---Yes Related visit to physician within the last 2 weeks? ---No Does the PT have any chronic conditions? (i.e. diabetes, asthma, etc.) ---Yes List chronic conditions. ---NIDDM Is this a behavioral health or substance abuse call? ---No Guidelines Guideline Title Affirmed Question Affirmed Notes Tick Bite Red ring or bull's-eye rash occurs at tick bite Final Disposition User See Physician within [address] Hours [place], [place], [place] Comments Appt made with Dr. Mauricio Po for 1:30 pm today as there were no appts available with Dr. Alain Marion for today. Referrals REFERRED TO PCP OFFICE Disagree/Comply: Comply

## 2016-08-18 NOTE — Assessment & Plan Note (Signed)
New onset take bite with mild redness and edema and previously prescribed doxycycline started within the last 24 hours. Tick was in a container and appeared completely intact.  Continue current dosage of doxycycline. Continue basic wound care with soap and water. Advised to follow-up if symptoms of fever, chills, joint pains, or erythema migrans develop.

## 2016-08-18 NOTE — Progress Notes (Signed)
Subjective:    Patient ID: Tiffany Gibbs, female    DOB: May 27, 1945, 71 y.o.   MRN: [id number]  Chief Complaint  Patient presents with  . Tick Removal    tick bite that she noticed on sunday, has a place on her back from where the tick was    HPI:  Tiffany Gibbs is a 71 y.o. female who  has a past medical history of Anxiety; Cataract; Depression; DM type 2 (diabetes mellitus, type 2) ([place]); Fibromyalgia; H/O dizziness; Hives; HTN (hypertension); Hyperlipidemia; Low back pain; OSA (obstructive sleep apnea); Osteoarthritis; Osteoporosis; and SOB (shortness of breath) on exertion. and presents today for an acute office visit.  This is a new problem. Associated symptom of a tick bite was first noted about 3 days ago which she intially believed to be a mole and located on her back. Tick was removed by her husband without complication. Blood work was drawn at an Urgent Care and she was prescribed doxycyline. Unsure as to how long the tick was present. Site appears to be enlarged and has also been treating the itching with OTC spray. No fevers.   Allergies  Allergen Reactions  . Celecoxib     swell  . Enalapril     cough  . Losartan     falls  . Lovastatin     REACTION: aches  . Naproxen   . Pravachol [Pravastatin Sodium]     Nausea       Outpatient Medications Prior to Visit  Medication Sig Dispense Refill  . amoxicillin (AMOXIL) 500 MG capsule as needed.    [name] aspirin EC 81 MG tablet Take 1 tablet (81 mg total) by mouth daily. 100 tablet 3  . B Complex-C (SUPER B COMPLEX PO) Take by mouth daily.    . Cholecalciferol (VITAMIN D) 1000 UNITS capsule Take 1 capsule (1,000 Units total) by mouth daily. 100 capsule 3  . citalopram (CELEXA) 40 MG tablet Take 1 tablet (40 mg total) by mouth daily. 90 tablet 3  . clotrimazole-betamethasone (LOTRISONE) cream Apply 1 application topically 2 (two) times daily. (Patient taking differently: Apply 1 application topically as needed. ) 45 g 1    . diphenhydrAMINE (BENADRYL) 25 MG tablet Take 25 mg by mouth at bedtime as needed.     . ezetimibe (ZETIA) 10 MG tablet Take 1 tablet (10 mg total) by mouth daily. 90 tablet 1  . Ferrous Sulfate (IRON) 325 (65 Fe) MG TABS Take 1 tablet by mouth 3 (three) times a week.    . fluticasone (FLONASE) 50 MCG/ACT nasal spray Place 1 spray into both nostrils daily as needed for allergies or rhinitis.    . furosemide (LASIX) 40 MG tablet Take 1 tablet (40 mg total) by mouth daily as needed. 90 tablet 3  . ibuprofen (ADVIL,MOTRIN) 600 MG tablet Take 1 tablet (600 mg total) by mouth 2 (two) times daily as needed. 60 tablet 3  . loratadine (CLARITIN) 10 MG tablet Take 10 mg by mouth daily.    . metFORMIN (GLUCOPHAGE) 500 MG tablet Take 1 tablet (500 mg total) by mouth 2 (two) times daily with a meal. 180 tablet 3  . Omega-3 Fatty Acids (FISH OIL) 1200 MG CAPS Take 2,400 mg by mouth 2 (two) times daily.    [name] spironolactone (ALDACTONE) 50 MG tablet Take 1 tablet (50 mg total) by mouth daily. 90 tablet 3  . triamcinolone cream (KENALOG) 0.5 % Apply 1 application topically 3 (three) times daily as  needed (to rash). 90 g 3   Facility-Administered Medications Prior to Visit  Medication Dose Route Frequency Provider Last Rate Last Dose  . 0.9 %  sodium chloride infusion  500 mL Intravenous Continuous Pyrtle, Lajuan Lines, MD      . 0.9 %  sodium chloride infusion  500 mL Intravenous Continuous Pyrtle, Lajuan Lines, MD      . dextrose 5 % solution   Intravenous Continuous Pyrtle, Lajuan Lines, MD         Review of Systems  Constitutional: Negative for chills, diaphoresis, fatigue, fever and unexpected weight change.  Respiratory: Negative for chest tightness, shortness of breath and wheezing.   Gastrointestinal: Negative for abdominal distention.  Musculoskeletal: Negative for arthralgias and myalgias.  Neurological: Negative for weakness and numbness.      Objective:    BP 132/62 (BP Location: Left Arm, Patient Position:  Sitting, Cuff Size: Large)   Pulse 65   Temp 98 F (36.7 C) (Oral)   Resp 16   Ht 5\' 4"  (1.626 m)   Wt 198 lb 1.9 oz (89.9 kg)   SpO2 98%   BMI 34.01 kg/m  Nursing note and vital signs reviewed.  Physical Exam  Constitutional: She is oriented to person, place, and time. She appears well-developed and well-nourished. No distress.  Cardiovascular: Normal rate, regular rhythm, normal heart sounds and intact distal pulses.   Pulmonary/Chest: Effort normal and breath sounds normal.  Neurological: She is alert and oriented to person, place, and time.  Skin: Skin is warm and dry.  1-2 cm annular raised lesion located on the left aspect of the middle of her back with redness and without discharge. Lesion is firm and mildly tender. No errythemia migrans or other rash.  Psychiatric: She has a normal mood and affect. Her behavior is normal. Judgment and thought content normal.       Assessment & Plan:   Problem List Items Addressed This Visit      Musculoskeletal and Integument   Tick bite - Primary    New onset take bite with mild redness and edema and previously prescribed doxycycline started within the last 24 hours. Tick was in a container and appeared completely intact.  Continue current dosage of doxycycline. Continue basic wound care with soap and water. Advised to follow-up if symptoms of fever, chills, joint pains, or erythema migrans develop.          I am having Ms. Wiegman maintain her Vitamin D, Fish Oil, diphenhydrAMINE, loratadine, fluticasone, aspirin EC, clotrimazole-betamethasone, ibuprofen, B Complex-C (SUPER B COMPLEX PO), spironolactone, amoxicillin, citalopram, metFORMIN, Iron, furosemide, triamcinolone cream, and ezetimibe. We will continue to administer sodium chloride, sodium chloride, and dextrose.   Follow-up: Return if symptoms worsen or fail to improve.  Mauricio Po, FNP

## 2016-08-20 DIAGNOSIS — G4733 Obstructive sleep apnea (adult) (pediatric): Secondary | ICD-10-CM | POA: Diagnosis not present

## 2016-09-16 ENCOUNTER — Ambulatory Visit: Payer: Medicare HMO | Admitting: Internal Medicine

## 2016-09-21 DIAGNOSIS — G4733 Obstructive sleep apnea (adult) (pediatric): Secondary | ICD-10-CM | POA: Diagnosis not present

## 2016-09-28 DIAGNOSIS — R69 Illness, unspecified: Secondary | ICD-10-CM | POA: Diagnosis not present

## 2016-10-01 ENCOUNTER — Other Ambulatory Visit (INDEPENDENT_AMBULATORY_CARE_PROVIDER_SITE_OTHER): Payer: Medicare HMO

## 2016-10-01 ENCOUNTER — Encounter: Payer: Self-pay | Admitting: Internal Medicine

## 2016-10-01 ENCOUNTER — Ambulatory Visit (INDEPENDENT_AMBULATORY_CARE_PROVIDER_SITE_OTHER): Payer: Medicare HMO | Admitting: Internal Medicine

## 2016-10-01 VITALS — BP 108/62 | HR 58 | Temp 97.8°F | Ht 64.0 in | Wt 203.0 lb

## 2016-10-01 DIAGNOSIS — E119 Type 2 diabetes mellitus without complications: Secondary | ICD-10-CM | POA: Diagnosis not present

## 2016-10-01 DIAGNOSIS — Z6834 Body mass index (BMI) 34.0-34.9, adult: Secondary | ICD-10-CM

## 2016-10-01 DIAGNOSIS — Z23 Encounter for immunization: Secondary | ICD-10-CM | POA: Diagnosis not present

## 2016-10-01 DIAGNOSIS — M5440 Lumbago with sciatica, unspecified side: Secondary | ICD-10-CM | POA: Diagnosis not present

## 2016-10-01 DIAGNOSIS — E6609 Other obesity due to excess calories: Secondary | ICD-10-CM

## 2016-10-01 DIAGNOSIS — E538 Deficiency of other specified B group vitamins: Secondary | ICD-10-CM | POA: Diagnosis not present

## 2016-10-01 LAB — BASIC METABOLIC PANEL
BUN: 16 mg/dL (ref 6–23)
CALCIUM: 9.3 mg/dL (ref 8.4–10.5)
CO2: 29 mEq/L (ref 19–32)
CREATININE: 0.97 mg/dL (ref 0.40–1.20)
Chloride: 103 mEq/L (ref 96–112)
GFR: 60.13 mL/min (ref >60.00)
Glucose, Bld: 102 mg/dL — ABNORMAL HIGH (ref 70–99)
Potassium: 3.5 mEq/L (ref 3.5–5.1)
Sodium: 140 mEq/L (ref 135–145)

## 2016-10-01 LAB — HEMOGLOBIN A1C: HEMOGLOBIN A1C: 5.8 % (ref 4.6–6.5)

## 2016-10-01 MED ORDER — PHENTERMINE HCL 37.5 MG PO TABS: 37.5000 mg | ORAL_TABLET | Freq: Every day | ORAL | 1 refills | Status: DC

## 2016-10-01 NOTE — Progress Notes (Signed)
Subjective:  Patient ID: Tiffany Gibbs, female    DOB: January 30, 1945  Age: 71 y.o. MRN: [id number]  CC: No chief complaint on file.   HPI Tiffany Gibbs presents for anxiety, DM, HTN f/u C/o wt gain, obesity - asking for a Phentermine   Outpatient Medications Prior to Visit  Medication Sig Dispense Refill  . amoxicillin (AMOXIL) 500 MG capsule as needed.    [name] aspirin EC 81 MG tablet Take 1 tablet (81 mg total) by mouth daily. 100 tablet 3  . B Complex-C (SUPER B COMPLEX PO) Take by mouth daily.    . Cholecalciferol (VITAMIN D) 1000 UNITS capsule Take 1 capsule (1,000 Units total) by mouth daily. 100 capsule 3  . citalopram (CELEXA) 40 MG tablet Take 1 tablet (40 mg total) by mouth daily. 90 tablet 3  . clotrimazole-betamethasone (LOTRISONE) cream Apply 1 application topically 2 (two) times daily. (Patient taking differently: Apply 1 application topically as needed. ) 45 g 1  . diphenhydrAMINE (BENADRYL) 25 MG tablet Take 25 mg by mouth at bedtime as needed.     . ezetimibe (ZETIA) 10 MG tablet Take 1 tablet (10 mg total) by mouth daily. 90 tablet 1  . Ferrous Sulfate (IRON) 325 (65 Fe) MG TABS Take 1 tablet by mouth 3 (three) times a week.    . fluticasone (FLONASE) 50 MCG/ACT nasal spray Place 1 spray into both nostrils daily as needed for allergies or rhinitis.    . furosemide (LASIX) 40 MG tablet Take 1 tablet (40 mg total) by mouth daily as needed. 90 tablet 3  . ibuprofen (ADVIL,MOTRIN) 600 MG tablet Take 1 tablet (600 mg total) by mouth 2 (two) times daily as needed. 60 tablet 3  . loratadine (CLARITIN) 10 MG tablet Take 10 mg by mouth daily.    . metFORMIN (GLUCOPHAGE) 500 MG tablet Take 1 tablet (500 mg total) by mouth 2 (two) times daily with a meal. 180 tablet 3  . Omega-3 Fatty Acids (FISH OIL) 1200 MG CAPS Take 2,400 mg by mouth 2 (two) times daily.    [name] spironolactone (ALDACTONE) 50 MG tablet Take 1 tablet (50 mg total) by mouth daily. 90 tablet 3  . triamcinolone cream  (KENALOG) 0.5 % Apply 1 application topically 3 (three) times daily as needed (to rash). 90 g 3   Facility-Administered Medications Prior to Visit  Medication Dose Route Frequency Provider Last Rate Last Dose  . 0.9 %  sodium chloride infusion  500 mL Intravenous Continuous Pyrtle, Lajuan Lines, MD      . 0.9 %  sodium chloride infusion  500 mL Intravenous Continuous Pyrtle, Lajuan Lines, MD      . dextrose 5 % solution   Intravenous Continuous Pyrtle, Lajuan Lines, MD        ROS Review of Systems  Constitutional: Negative for activity change, appetite change, chills, fatigue and unexpected weight change.  HENT: Negative for congestion, mouth sores and sinus pressure.   Eyes: Negative for visual disturbance.  Respiratory: Negative for cough and chest tightness.   Gastrointestinal: Negative for abdominal pain and nausea.  Genitourinary: Negative for difficulty urinating, frequency and vaginal pain.  Musculoskeletal: Negative for back pain and gait problem.  Skin: Negative for pallor and rash.  Neurological: Negative for dizziness, tremors, weakness, numbness and headaches.  Psychiatric/Behavioral: Negative for confusion and sleep disturbance. The patient is nervous/anxious.     Objective:  BP 108/62 (BP Location: Left Arm, Patient Position: Sitting, Cuff Size: Large)  Pulse (!) 58   Temp 97.8 F (36.6 C) (Oral)   Ht 5\' 4"  (1.626 m)   Wt 203 lb (92.1 kg)   SpO2 100%   BMI 34.84 kg/m   BP Readings from Last 3 Encounters:  10/01/16 108/62  08/18/16 132/62  06/22/16 124/68    Wt Readings from Last 3 Encounters:  10/01/16 203 lb (92.1 kg)  08/18/16 198 lb 1.9 oz (89.9 kg)  06/22/16 199 lb (90.3 kg)    Physical Exam  Constitutional: She appears well-developed. No distress.  HENT:  Head: Normocephalic.  Right Ear: External ear normal.  Left Ear: External ear normal.  Nose: Nose normal.  Mouth/Throat: Oropharynx is clear and moist.  Eyes: Pupils are equal, round, and reactive to light.  Conjunctivae are normal. Right eye exhibits no discharge. Left eye exhibits no discharge.  Neck: Normal range of motion. Neck supple. No JVD present. No tracheal deviation present. No thyromegaly present.  Cardiovascular: Normal rate, regular rhythm and normal heart sounds.   Pulmonary/Chest: No stridor. No respiratory distress. She has no wheezes.  Abdominal: Soft. Bowel sounds are normal. She exhibits no distension and no mass. There is no tenderness. There is no rebound and no guarding.  Musculoskeletal: She exhibits no edema.  Lymphadenopathy:    She has no cervical adenopathy.  Neurological: She displays normal reflexes. No cranial nerve deficit. She exhibits normal muscle tone. Coordination normal.  Skin: No rash noted. No erythema.  Psychiatric: She has a normal mood and affect. Her behavior is normal. Judgment and thought content normal.  eryth nodule on back L  Lab Results  Component Value Date   WBC 7.3 04/25/2015   HGB 13.6 04/25/2015   HCT 39.8 04/25/2015   PLT 241.0 04/25/2015   GLUCOSE 99 04/01/2016   CHOL 191 02/05/2016   TRIG 122.0 02/05/2016   HDL 56.20 02/05/2016   LDLDIRECT 148.2 02/03/2013   LDLCALC 111 (H) 02/05/2016   ALT 17 11/19/2014   AST 18 11/19/2014   NA 138 04/01/2016   K 3.9 04/01/2016   CL 103 04/01/2016   CREATININE 0.99 04/01/2016   BUN 18 04/01/2016   CO2 26 04/01/2016   TSH 1.83 04/01/2016   INR 1.14 11/21/2012   HGBA1C 5.9 04/01/2016   MICROALBUR <0.7 12/11/2015    US Abdominal Aorta Screening Aaa  Result Date: 12/23/2015 CLINICAL DATA:  Screening EXAM: ULTRASOUND OF ABDOMINAL AORTA TECHNIQUE: Ultrasound examination of the abdominal aorta was performed to evaluate for abdominal aortic aneurysm. COMPARISON:  None. FINDINGS: Abdominal Aorta No aneurysm identified. Maximum Diameter: 1.7 cm proximally IMPRESSION: No evidence of abdominal aortic aneurysm. Electronically Signed   By: Rolm Baptise M.D.   On: 12/23/2015 10:06    Assessment &  Plan:   There are no diagnoses linked to this encounter. I am having Ms. Stoneberg maintain her Vitamin D, Fish Oil, diphenhydrAMINE, loratadine, fluticasone, aspirin EC, clotrimazole-betamethasone, ibuprofen, B Complex-C (SUPER B COMPLEX PO), spironolactone, amoxicillin, citalopram, metFORMIN, Iron, furosemide, triamcinolone cream, and ezetimibe. We will continue to administer sodium chloride, sodium chloride, and dextrose.  No orders of the defined types were placed in this encounter.    Follow-up: No Follow-up on file.  Walker Kehr, MD

## 2016-10-01 NOTE — Assessment & Plan Note (Signed)
On Metformin 

## 2016-10-01 NOTE — Assessment & Plan Note (Addendum)
wt gain, obesity - asking for a Phentermine Discussed a ref to Dr Leafy Ro  Potential benefits of a long/short term Adipex use as well as potential risks  and complications were explained to the patient and were aknowledged.

## 2016-10-01 NOTE — Addendum Note (Signed)
Addended by: Karren Cobble on: 10/01/2016 10:18 AM   Modules accepted: Orders

## 2016-10-01 NOTE — Patient Instructions (Signed)
Dr Caren Beasley 

## 2016-10-01 NOTE — Assessment & Plan Note (Signed)
Doing well 

## 2016-10-01 NOTE — Assessment & Plan Note (Signed)
On B12 

## 2016-10-02 ENCOUNTER — Telehealth: Payer: Self-pay | Admitting: Internal Medicine

## 2016-10-02 DIAGNOSIS — L300 Nummular dermatitis: Secondary | ICD-10-CM | POA: Diagnosis not present

## 2016-10-02 NOTE — Telephone Encounter (Signed)
FYI

## 2016-10-02 NOTE — Telephone Encounter (Signed)
Pt wanted [place] to inform Plot she did go to the dermatologists today and they did confirm she does have a cysts on her back and they will be surgically removing it on October 4th.

## 2016-10-12 DIAGNOSIS — Z1231 Encounter for screening mammogram for malignant neoplasm of breast: Secondary | ICD-10-CM | POA: Diagnosis not present

## 2016-10-12 LAB — HM MAMMOGRAPHY

## 2016-10-15 ENCOUNTER — Telehealth: Payer: Self-pay | Admitting: Internal Medicine

## 2016-10-15 DIAGNOSIS — D485 Neoplasm of uncertain behavior of skin: Secondary | ICD-10-CM | POA: Diagnosis not present

## 2016-10-15 MED ORDER — PHENTERMINE HCL 37.5 MG PO TABS: 37.5000 mg | ORAL_TABLET | Freq: Every day | ORAL | 1 refills | Status: DC

## 2016-10-15 NOTE — Telephone Encounter (Signed)
OK to fill this/these prescription(s) with additional refills x1 Thank you!

## 2016-10-15 NOTE — Telephone Encounter (Signed)
Called pt no answer LMOM refill has been called into [facility]...Tiffany Gibbs

## 2016-10-15 NOTE — Telephone Encounter (Signed)
phentermine (ADIPEX-P) 37.5 MG tablet   Patient is requesting a refill on this medication to be sent to:  [place], [place] [phone] (Phone) [phone] (Fax)   She states she is going out of town next week for 24 days. She states she has lost 8lbs. Please advise.

## 2016-10-16 ENCOUNTER — Encounter: Payer: Self-pay | Admitting: Internal Medicine

## 2016-10-22 DIAGNOSIS — G4733 Obstructive sleep apnea (adult) (pediatric): Secondary | ICD-10-CM | POA: Diagnosis not present

## 2016-11-04 DIAGNOSIS — S21201A Unspecified open wound of right back wall of thorax without penetration into thoracic cavity, initial encounter: Secondary | ICD-10-CM | POA: Diagnosis not present

## 2016-11-04 DIAGNOSIS — Z4802 Encounter for removal of sutures: Secondary | ICD-10-CM | POA: Diagnosis not present

## 2016-11-23 DIAGNOSIS — G4733 Obstructive sleep apnea (adult) (pediatric): Secondary | ICD-10-CM | POA: Diagnosis not present

## 2016-11-25 DIAGNOSIS — H35363 Drusen (degenerative) of macula, bilateral: Secondary | ICD-10-CM | POA: Diagnosis not present

## 2016-11-25 DIAGNOSIS — H43813 Vitreous degeneration, bilateral: Secondary | ICD-10-CM | POA: Diagnosis not present

## 2016-11-25 DIAGNOSIS — E119 Type 2 diabetes mellitus without complications: Secondary | ICD-10-CM | POA: Diagnosis not present

## 2016-11-25 DIAGNOSIS — Z7984 Long term (current) use of oral hypoglycemic drugs: Secondary | ICD-10-CM | POA: Diagnosis not present

## 2016-11-25 DIAGNOSIS — G4730 Sleep apnea, unspecified: Secondary | ICD-10-CM | POA: Diagnosis not present

## 2016-12-07 ENCOUNTER — Other Ambulatory Visit: Payer: Self-pay

## 2016-12-07 ENCOUNTER — Telehealth: Payer: Self-pay | Admitting: Internal Medicine

## 2016-12-07 MED ORDER — EZETIMIBE 10 MG PO TABS: 10.0000 mg | ORAL_TABLET | Freq: Every day | ORAL | 1 refills | Status: DC

## 2016-12-07 NOTE — Telephone Encounter (Signed)
RX sent, LM notifying pt 

## 2016-12-07 NOTE — Telephone Encounter (Signed)
Pt called requesting a prescription for Ezetimibe tab 10mg  for 90 days at [facility] in [place]. She would like a call back.

## 2016-12-10 ENCOUNTER — Telehealth: Payer: Self-pay | Admitting: Internal Medicine

## 2016-12-10 DIAGNOSIS — J4520 Mild intermittent asthma, uncomplicated: Secondary | ICD-10-CM

## 2016-12-10 DIAGNOSIS — I8002 Phlebitis and thrombophlebitis of superficial vessels of left lower extremity: Secondary | ICD-10-CM

## 2016-12-10 DIAGNOSIS — F411 Generalized anxiety disorder: Secondary | ICD-10-CM

## 2016-12-10 DIAGNOSIS — Z0000 Encounter for general adult medical examination without abnormal findings: Secondary | ICD-10-CM

## 2016-12-10 DIAGNOSIS — E119 Type 2 diabetes mellitus without complications: Secondary | ICD-10-CM

## 2016-12-10 DIAGNOSIS — I10 Essential (primary) hypertension: Secondary | ICD-10-CM

## 2016-12-10 DIAGNOSIS — R413 Other amnesia: Secondary | ICD-10-CM

## 2016-12-10 DIAGNOSIS — E538 Deficiency of other specified B group vitamins: Secondary | ICD-10-CM

## 2016-12-10 MED ORDER — METFORMIN HCL 500 MG PO TABS: 500.0000 mg | ORAL_TABLET | Freq: Two times a day (BID) | ORAL | 3 refills | Status: DC

## 2016-12-10 NOTE — Telephone Encounter (Signed)
Needs Metformin sent to a different pharmacy.

## 2016-12-10 NOTE — Telephone Encounter (Signed)
Copied from [place]. Topic: Inquiry >> Dec 10, 2016 12:51 PM Oliver Pila B wrote: Reason for CRM: pt wanted her metformin 500mg  2x a day to be 90 days and 3 refills to be called into [facility], contact pt if needed

## 2016-12-10 NOTE — Telephone Encounter (Signed)
RX sent

## 2016-12-18 ENCOUNTER — Telehealth: Payer: Self-pay | Admitting: Internal Medicine

## 2016-12-18 DIAGNOSIS — E119 Type 2 diabetes mellitus without complications: Secondary | ICD-10-CM

## 2016-12-18 DIAGNOSIS — E538 Deficiency of other specified B group vitamins: Secondary | ICD-10-CM

## 2016-12-18 DIAGNOSIS — E7849 Other hyperlipidemia: Secondary | ICD-10-CM

## 2016-12-18 DIAGNOSIS — I10 Essential (primary) hypertension: Secondary | ICD-10-CM

## 2016-12-18 NOTE — Telephone Encounter (Signed)
Copied from [place] [id number]. Topic: Quick Communication - See Telephone Encounter >> Dec 18, 2016  2:10 PM Hewitt Shorts wrote: CRM for notification. See Telephone encounter for: pt is needing to get all the lab work that is necessary for this patient to compare for 2019 and that she is planning on coming in on Monday to have this done I believe she is needing and order put in   [facility] number [phone]      12/18/16.

## 2016-12-18 NOTE — Telephone Encounter (Signed)
Labs entered,

## 2016-12-24 DIAGNOSIS — G4733 Obstructive sleep apnea (adult) (pediatric): Secondary | ICD-10-CM | POA: Diagnosis not present

## 2016-12-31 ENCOUNTER — Other Ambulatory Visit (INDEPENDENT_AMBULATORY_CARE_PROVIDER_SITE_OTHER): Payer: Medicare HMO

## 2016-12-31 DIAGNOSIS — E7849 Other hyperlipidemia: Secondary | ICD-10-CM

## 2016-12-31 DIAGNOSIS — I10 Essential (primary) hypertension: Secondary | ICD-10-CM

## 2016-12-31 DIAGNOSIS — E119 Type 2 diabetes mellitus without complications: Secondary | ICD-10-CM

## 2016-12-31 DIAGNOSIS — E538 Deficiency of other specified B group vitamins: Secondary | ICD-10-CM | POA: Diagnosis not present

## 2016-12-31 LAB — CBC WITH DIFFERENTIAL/PLATELET
BASOS PCT: 1.3 % (ref 0.0–3.0)
Basophils Absolute: 0.1 10*3/uL (ref 0.0–0.1)
EOS PCT: 3 % (ref 0.0–5.0)
Eosinophils Absolute: 0.2 10*3/uL (ref 0.0–0.7)
HEMATOCRIT: 40 % (ref 36.0–46.0)
HEMOGLOBIN: 13.4 g/dL (ref 12.0–15.0)
LYMPHS PCT: 33.1 % (ref 12.0–46.0)
Lymphs Abs: 2 10*3/uL (ref 0.7–4.0)
MCHC: 33.4 g/dL (ref 30.0–36.0)
MCV: 91.6 fl (ref 78.0–100.0)
Monocytes Absolute: 0.7 10*3/uL (ref 0.1–1.0)
Monocytes Relative: 10.9 % (ref 3.0–12.0)
Neutro Abs: 3.2 10*3/uL (ref 1.4–7.7)
Neutrophils Relative %: 51.7 % (ref 43.0–77.0)
Platelets: 223 10*3/uL (ref 150.0–400.0)
RBC: 4.36 Mil/uL (ref 3.87–5.11)
RDW: 13.1 % (ref 11.5–15.5)
WBC: 6.1 10*3/uL (ref 4.0–10.5)

## 2016-12-31 LAB — COMPREHENSIVE METABOLIC PANEL
ALBUMIN: 4.2 g/dL (ref 3.5–5.2)
ALT: 14 U/L (ref 0–35)
AST: 16 U/L (ref 0–37)
Alkaline Phosphatase: 44 U/L (ref 39–117)
BUN: 20 mg/dL (ref 6–23)
CALCIUM: 9.1 mg/dL (ref 8.4–10.5)
CHLORIDE: 105 meq/L (ref 96–112)
CO2: 28 meq/L (ref 19–32)
Creatinine, Ser: 0.89 mg/dL (ref 0.40–1.20)
GFR: 66.36 mL/min (ref >60.00)
Glucose, Bld: 90 mg/dL (ref 70–99)
POTASSIUM: 3.6 meq/L (ref 3.5–5.1)
SODIUM: 141 meq/L (ref 135–145)
Total Bilirubin: 0.5 mg/dL (ref 0.2–1.2)
Total Protein: 7 g/dL (ref 6.0–8.3)

## 2016-12-31 LAB — LIPID PANEL
CHOL/HDL RATIO: 3
Cholesterol: 159 mg/dL (ref 0–200)
HDL: 52.9 mg/dL (ref >39.00)
LDL CALC: 90 mg/dL (ref 0–99)
NONHDL: 106.04
TRIGLYCERIDES: 81 mg/dL (ref 0.0–149.0)
VLDL: 16.2 mg/dL (ref 0.0–40.0)

## 2016-12-31 LAB — MICROALBUMIN / CREATININE URINE RATIO
Creatinine,U: 178.4 mg/dL
MICROALB UR: 1.3 mg/dL (ref 0.0–1.9)
Microalb Creat Ratio: 0.7 mg/g (ref 0.0–30.0)

## 2016-12-31 LAB — VITAMIN B12: VITAMIN B 12: 281 pg/mL (ref 211–911)

## 2016-12-31 LAB — HEMOGLOBIN A1C: Hgb A1c MFr Bld: 5.9 % (ref 4.6–6.5)

## 2017-01-07 ENCOUNTER — Encounter: Payer: Self-pay | Admitting: Internal Medicine

## 2017-01-07 ENCOUNTER — Ambulatory Visit (INDEPENDENT_AMBULATORY_CARE_PROVIDER_SITE_OTHER): Payer: Medicare HMO | Admitting: Internal Medicine

## 2017-01-07 DIAGNOSIS — E6609 Other obesity due to excess calories: Secondary | ICD-10-CM

## 2017-01-07 DIAGNOSIS — E538 Deficiency of other specified B group vitamins: Secondary | ICD-10-CM | POA: Diagnosis not present

## 2017-01-07 DIAGNOSIS — Z6834 Body mass index (BMI) 34.0-34.9, adult: Secondary | ICD-10-CM | POA: Diagnosis not present

## 2017-01-07 DIAGNOSIS — R413 Other amnesia: Secondary | ICD-10-CM | POA: Diagnosis not present

## 2017-01-07 DIAGNOSIS — J4520 Mild intermittent asthma, uncomplicated: Secondary | ICD-10-CM | POA: Diagnosis not present

## 2017-01-07 DIAGNOSIS — I8002 Phlebitis and thrombophlebitis of superficial vessels of left lower extremity: Secondary | ICD-10-CM | POA: Diagnosis not present

## 2017-01-07 DIAGNOSIS — I10 Essential (primary) hypertension: Secondary | ICD-10-CM | POA: Diagnosis not present

## 2017-01-07 DIAGNOSIS — F411 Generalized anxiety disorder: Secondary | ICD-10-CM

## 2017-01-07 DIAGNOSIS — E119 Type 2 diabetes mellitus without complications: Secondary | ICD-10-CM

## 2017-01-07 DIAGNOSIS — Z0000 Encounter for general adult medical examination without abnormal findings: Secondary | ICD-10-CM

## 2017-01-07 DIAGNOSIS — F3340 Major depressive disorder, recurrent, in remission, unspecified: Secondary | ICD-10-CM

## 2017-01-07 DIAGNOSIS — R69 Illness, unspecified: Secondary | ICD-10-CM | POA: Diagnosis not present

## 2017-01-07 MED ORDER — FUROSEMIDE 40 MG PO TABS: 40.0000 mg | ORAL_TABLET | Freq: Every day | ORAL | 3 refills | Status: DC | PRN

## 2017-01-07 MED ORDER — PHENTERMINE HCL 37.5 MG PO TABS: 37.5000 mg | ORAL_TABLET | Freq: Every day | ORAL | 2 refills | Status: DC

## 2017-01-07 MED ORDER — METFORMIN HCL 500 MG PO TABS: 500.0000 mg | ORAL_TABLET | Freq: Two times a day (BID) | ORAL | 3 refills | Status: DC

## 2017-01-07 MED ORDER — EZETIMIBE 10 MG PO TABS: 10.0000 mg | ORAL_TABLET | Freq: Every day | ORAL | 3 refills | Status: DC

## 2017-01-07 NOTE — Assessment & Plan Note (Addendum)
Adipex po Potential benefits of a long/short term Adipex use as well as potential risks  and complications were explained to the patient and were aknowledged.

## 2017-01-07 NOTE — Assessment & Plan Note (Signed)
Cont w/wt loss 

## 2017-01-07 NOTE — Assessment & Plan Note (Signed)
Celexa

## 2017-01-07 NOTE — Assessment & Plan Note (Signed)
Wt Readings from Last 3 Encounters:  01/07/17 186 lb (84.4 kg)  10/01/16 203 lb (92.1 kg)  08/18/16 198 lb 1.9 oz (89.9 kg)

## 2017-01-07 NOTE — Progress Notes (Signed)
Subjective:  Patient ID: Tiffany Gibbs, female    DOB: Aug 08, 1945  Age: 71 y.o. MRN: [id number]  CC: No chief complaint on file.   HPI Tiffany Gibbs presents for HTN, anxiety, HTN f/u. Pt lost wt on diet. Tiffany Gibbs is worrying her  Outpatient Medications Prior to Visit  Medication Sig Dispense Refill  . amoxicillin (AMOXIL) 500 MG capsule as needed.    [name] aspirin EC 81 MG tablet Take 1 tablet (81 mg total) by mouth daily. 100 tablet 3  . B Complex-C (SUPER B COMPLEX PO) Take by mouth daily.    . Cholecalciferol (VITAMIN D) 1000 UNITS capsule Take 1 capsule (1,000 Units total) by mouth daily. 100 capsule 3  . citalopram (CELEXA) 40 MG tablet Take 1 tablet (40 mg total) by mouth daily. 90 tablet 3  . clotrimazole-betamethasone (LOTRISONE) cream Apply 1 application topically 2 (two) times daily. (Patient taking differently: Apply 1 application topically as needed. ) 45 g 1  . diphenhydrAMINE (BENADRYL) 25 MG tablet Take 25 mg by mouth at bedtime as needed.     . ezetimibe (ZETIA) 10 MG tablet Take 1 tablet (10 mg total) by mouth daily. 90 tablet 1  . Ferrous Sulfate (IRON) 325 (65 Fe) MG TABS Take 1 tablet by mouth 3 (three) times a week.    . fluticasone (FLONASE) 50 MCG/ACT nasal spray Place 1 spray into both nostrils daily as needed for allergies or rhinitis.    . furosemide (LASIX) 40 MG tablet Take 1 tablet (40 mg total) by mouth daily as needed. 90 tablet 3  . ibuprofen (ADVIL,MOTRIN) 600 MG tablet Take 1 tablet (600 mg total) by mouth 2 (two) times daily as needed. 60 tablet 3  . loratadine (CLARITIN) 10 MG tablet Take 10 mg by mouth daily.    . metFORMIN (GLUCOPHAGE) 500 MG tablet Take 1 tablet (500 mg total) by mouth 2 (two) times daily with a meal. 180 tablet 3  . Omega-3 Fatty Acids (FISH OIL) 1200 MG CAPS Take 2,400 mg by mouth 2 (two) times daily.    [name] triamcinolone cream (KENALOG) 0.5 % Apply 1 application topically 3 (three) times daily as needed (to rash). 90 g 3  . phentermine  (ADIPEX-P) 37.5 MG tablet Take 1 tablet (37.5 mg total) by mouth daily before breakfast. 30 tablet 1  . spironolactone (ALDACTONE) 50 MG tablet Take 1 tablet (50 mg total) by mouth daily. 90 tablet 3   Facility-Administered Medications Prior to Visit  Medication Dose Route Frequency Provider Last Rate Last Dose  . 0.9 %  sodium chloride infusion  500 mL Intravenous Continuous Pyrtle, Lajuan Lines, MD      . 0.9 %  sodium chloride infusion  500 mL Intravenous Continuous Pyrtle, Lajuan Lines, MD      . dextrose 5 % solution   Intravenous Continuous Pyrtle, Lajuan Lines, MD        ROS Review of Systems  Constitutional: Negative for activity change, appetite change, chills, fatigue and unexpected weight change.  HENT: Negative for congestion, mouth sores and sinus pressure.   Eyes: Negative for visual disturbance.  Respiratory: Negative for cough and chest tightness.   Gastrointestinal: Negative for abdominal pain and nausea.  Genitourinary: Negative for difficulty urinating, frequency and vaginal pain.  Musculoskeletal: Positive for arthralgias. Negative for back pain and gait problem.  Skin: Negative for pallor and rash.  Neurological: Negative for dizziness, tremors, weakness, numbness and headaches.  Psychiatric/Behavioral: Negative for confusion and sleep disturbance. The  patient is nervous/anxious.     Objective:  BP 112/70 (BP Location: Left Arm, Patient Position: Sitting, Cuff Size: Large)   Pulse 82   Temp 98.3 F (36.8 C) (Oral)   Ht 5\' 4"  (1.626 m)   Wt 186 lb (84.4 kg)   SpO2 98%   BMI 31.93 kg/m   BP Readings from Last 3 Encounters:  01/07/17 112/70  10/01/16 108/62  08/18/16 132/62    Wt Readings from Last 3 Encounters:  01/07/17 186 lb (84.4 kg)  10/01/16 203 lb (92.1 kg)  08/18/16 198 lb 1.9 oz (89.9 kg)    Physical Exam  Constitutional: She appears well-developed. No distress.  HENT:  Head: Normocephalic.  Right Ear: External ear normal.  Left Ear: External ear normal.    Nose: Nose normal.  Mouth/Throat: Oropharynx is clear and moist.  Eyes: Conjunctivae are normal. Pupils are equal, round, and reactive to light. Right eye exhibits no discharge. Left eye exhibits no discharge.  Neck: Normal range of motion. Neck supple. No JVD present. No tracheal deviation present. No thyromegaly present.  Cardiovascular: Normal rate, regular rhythm and normal heart sounds.  Pulmonary/Chest: No stridor. No respiratory distress. She has no wheezes.  Abdominal: Soft. Bowel sounds are normal. She exhibits no distension and no mass. There is no tenderness. There is no rebound and no guarding.  Musculoskeletal: She exhibits no edema or tenderness.  Lymphadenopathy:    She has no cervical adenopathy.  Neurological: She displays normal reflexes. No cranial nerve deficit. She exhibits normal muscle tone. Coordination normal.  Skin: No rash noted. No erythema.  Psychiatric: She has a normal mood and affect. Her behavior is normal. Judgment and thought content normal.    Lab Results  Component Value Date   WBC 6.1 12/31/2016   HGB 13.4 12/31/2016   HCT 40.0 12/31/2016   PLT 223.0 12/31/2016   GLUCOSE 90 12/31/2016   CHOL 159 12/31/2016   TRIG 81.0 12/31/2016   HDL 52.90 12/31/2016   LDLDIRECT 148.2 02/03/2013   LDLCALC 90 12/31/2016   ALT 14 12/31/2016   AST 16 12/31/2016   NA 141 12/31/2016   K 3.6 12/31/2016   CL 105 12/31/2016   CREATININE 0.89 12/31/2016   BUN 20 12/31/2016   CO2 28 12/31/2016   TSH 1.83 04/01/2016   INR 1.14 11/21/2012   HGBA1C 5.9 12/31/2016   MICROALBUR 1.3 12/31/2016    US Abdominal Aorta Screening Aaa  Result Date: 12/23/2015 CLINICAL DATA:  Screening EXAM: ULTRASOUND OF ABDOMINAL AORTA TECHNIQUE: Ultrasound examination of the abdominal aorta was performed to evaluate for abdominal aortic aneurysm. COMPARISON:  None. FINDINGS: Abdominal Aorta No aneurysm identified. Maximum Diameter: 1.7 cm proximally IMPRESSION: No evidence of abdominal  aortic aneurysm. Electronically Signed   By: Rolm Baptise M.D.   On: 12/23/2015 10:06    Assessment & Plan:   There are no diagnoses linked to this encounter. I am having Tiffany Gibbs B. Rettinger maintain her Vitamin D, Fish Oil, diphenhydrAMINE, loratadine, fluticasone, aspirin EC, clotrimazole-betamethasone, ibuprofen, B Complex-C (SUPER B COMPLEX PO), spironolactone, amoxicillin, citalopram, Iron, furosemide, triamcinolone cream, phentermine, ezetimibe, and metFORMIN. We will continue to administer sodium chloride, sodium chloride, and dextrose.  No orders of the defined types were placed in this encounter.    Follow-up: No Follow-up on file.  Walker Kehr, MD

## 2017-01-08 DIAGNOSIS — L720 Epidermal cyst: Secondary | ICD-10-CM | POA: Diagnosis not present

## 2017-01-15 ENCOUNTER — Other Ambulatory Visit: Payer: Self-pay | Admitting: Internal Medicine

## 2017-01-25 DIAGNOSIS — G4733 Obstructive sleep apnea (adult) (pediatric): Secondary | ICD-10-CM | POA: Diagnosis not present

## 2017-01-28 ENCOUNTER — Telehealth: Payer: Self-pay | Admitting: *Deleted

## 2017-01-28 NOTE — Telephone Encounter (Signed)
Noted, will watch for this letter.

## 2017-01-28 NOTE — Telephone Encounter (Signed)
Received call from patient stating she has a letter from the power company to insure that if she loses power, her home will be one of the first to be serviced. This is because she depends on a CPAP machine. This RN advised that if the physician needs to sign the letter, she needs to send it to Dr Rexene Alberts and Cyril Mourning RN. Patient stated she would, is mailing it to this office. She verbalized appreciation.

## 2017-02-10 DIAGNOSIS — J324 Chronic pansinusitis: Secondary | ICD-10-CM | POA: Diagnosis not present

## 2017-02-19 ENCOUNTER — Inpatient Hospital Stay: Payer: Medicare HMO | Admitting: Internal Medicine

## 2017-02-25 DIAGNOSIS — G4733 Obstructive sleep apnea (adult) (pediatric): Secondary | ICD-10-CM | POA: Diagnosis not present

## 2017-03-15 DIAGNOSIS — S8002XA Contusion of left knee, initial encounter: Secondary | ICD-10-CM | POA: Diagnosis not present

## 2017-03-29 DIAGNOSIS — G4733 Obstructive sleep apnea (adult) (pediatric): Secondary | ICD-10-CM | POA: Diagnosis not present

## 2017-04-05 DIAGNOSIS — R69 Illness, unspecified: Secondary | ICD-10-CM | POA: Diagnosis not present

## 2017-04-09 ENCOUNTER — Ambulatory Visit: Payer: Medicare HMO | Admitting: Internal Medicine

## 2017-04-19 ENCOUNTER — Telehealth: Payer: Self-pay | Admitting: Internal Medicine

## 2017-04-19 MED ORDER — LORAZEPAM 1 MG PO TABS: 1.0000 mg | ORAL_TABLET | Freq: Two times a day (BID) | ORAL | 1 refills | Status: DC | PRN

## 2017-04-19 NOTE — Telephone Encounter (Signed)
Copied from [place] [id number]. Topic: General - Other >> Apr 19, 2017  2:59 PM Tiffany Gibbs wrote: Reason for CRM: patient calling wanting to know if she can up her dosage on her Celexa because she have been having problems in her family and her anxiety is high she has been taking some of her husbands lorazepan 0.5mg  an states that that seems to help her   **Called spoke with patient and informed her she would need an appointment, she is also due for a 3 month FU.  She states she has to much going on and would like to know if her Celexa could be upped. She has been taking care of her husband and sick brother and has a lot of anxiety.

## 2017-04-19 NOTE — Telephone Encounter (Signed)
celexa dose is high already [employer] Rx to [place] Thx

## 2017-04-19 NOTE — Telephone Encounter (Signed)
Please advise 

## 2017-04-20 NOTE — Telephone Encounter (Signed)
Pt.notified

## 2017-04-26 ENCOUNTER — Ambulatory Visit: Payer: Self-pay

## 2017-04-26 NOTE — Telephone Encounter (Signed)
Patient called in with c/o "knee pain." She says "it started in my right calf, a feeling of something moving like a leg cramp, then the back of my knee started hurting. I noticed it when I got up to stand and it causes me to limp when I walk. It's about a 9 on the pain scale. I took Ibuprofen 600 mg and it didn't touch the pain. There is no swelling, redness. I haven't injured it either." I asked about other symptoms, she says "no other symptoms." According to protocol, see PCP within 3 days, no availability with provider, patient says she is unavailable on Wednesday this week. Appointment scheduled for tomorrow at 1520 with Dr. Jenny Reichmann, care advice given, patient verbalized understanding.   Reason for Disposition . [1] MODERATE pain (e.g., interferes with normal activities, limping) AND [2] present > 3 days  Answer Assessment - Initial Assessment Questions 1. LOCATION and RADIATION: "Where is the pain located?"      Back of right knee 2. QUALITY: "What does the pain feel like?"  (e.g., sharp, dull, aching, burning)     Sharp 3. SEVERITY: "How bad is the pain?" "What does it keep you from doing?"   (Scale 1-10; or mild, moderate, severe)   -  MILD (1-3): doesn't interfere with normal activities    -  MODERATE (4-7): interferes with normal activities (e.g., work or school) or awakens from sleep, limping    -  SEVERE (8-10): excruciating pain, unable to do any normal activities, unable to walk     9 4. ONSET: "When did the pain start?" "Does it come and go, or is it there all the time?"     About 1 week ago 5. RECURRENT: "Have you had this pain before?" If so, ask: "When, and what happened then?"     No 6. SETTING: "Has there been any recent work, exercise or other activity that involved that part of the body?"      No 7. AGGRAVATING FACTORS: "What makes the knee pain worse?" (e.g., walking, climbing stairs, running)     Walking, standing to up pressure 8. ASSOCIATED SYMPTOMS: "Is there any  swelling or redness of the knee?"     No 9. OTHER SYMPTOMS: "Do you have any other symptoms?" (e.g., chest pain, difficulty breathing, fever, calf pain)     No 10. PREGNANCY: "Is there any chance you are pregnant?" "When was your last menstrual period?"       No  Protocols used: KNEE PAIN-A-AH

## 2017-04-27 ENCOUNTER — Ambulatory Visit (INDEPENDENT_AMBULATORY_CARE_PROVIDER_SITE_OTHER): Payer: Medicare HMO | Admitting: Internal Medicine

## 2017-04-27 ENCOUNTER — Encounter: Payer: Self-pay | Admitting: Internal Medicine

## 2017-04-27 VITALS — BP 122/78 | HR 81 | Temp 98.3°F | Ht 64.0 in | Wt 192.0 lb

## 2017-04-27 DIAGNOSIS — M25561 Pain in right knee: Secondary | ICD-10-CM | POA: Insufficient documentation

## 2017-04-27 DIAGNOSIS — I10 Essential (primary) hypertension: Secondary | ICD-10-CM

## 2017-04-27 DIAGNOSIS — M5416 Radiculopathy, lumbar region: Secondary | ICD-10-CM | POA: Diagnosis not present

## 2017-04-27 DIAGNOSIS — M25552 Pain in left hip: Secondary | ICD-10-CM

## 2017-04-27 NOTE — Patient Instructions (Addendum)
You will be contacted regarding the referral for: MRI for the lower back  Please see Dr Tamala Julian for the back, left hip and right knee (ok to make appt at the checkout desk as you leave)  Please continue all other medications as before, and refills have been done if requested.  Please have the pharmacy call with any other refills you may need.  Please keep your appointments with your specialists as you may have planned

## 2017-04-27 NOTE — Progress Notes (Signed)
Subjective:    Patient ID: Tiffany Gibbs, female    DOB: April 29, 1945, 72 y.o.   MRN: [id number]  HPI  Here to f/u as pt of Dr Alain Marion, with c/o continues to have recurring left LBP with gradual worsening in severity in severity and freq since feb 2019 camping outing, without bowel or bladder change, fever, wt loss, but now with worsening LLE pain/numbness/weakness over the past wk, with some gait change but no falls.  Also has persistent right knee pain which is now especially worse with using the right leg more, with pain, swelling, but no giveaways, fever, hx of gout or other trauma. Overall prefers no pain med for now.  Also has recurring left hip pain on ambulation no change in past several months, mild, sharp, intermittent Past Medical History:  Diagnosis Date  . Anxiety   . Cataract   . Depression   . DM type 2 (diabetes mellitus, type 2) ([place])   . Fibromyalgia   . H/O dizziness   . Hives    from tomatoes  . HTN (hypertension)   . Hyperlipidemia   . Low back pain   . OSA (obstructive sleep apnea)    uses c-pap  . Osteoarthritis   . Osteoporosis   . SOB (shortness of breath) on exertion    Past Surgical History:  Procedure Laterality Date  . cervical cryotherapy     cervix  . EYE SURGERY     Cataracts/Bil  . LASIK Bilateral   . TOTAL ABDOMINAL HYSTERECTOMY    . TOTAL HIP ARTHROPLASTY  09/2008   right- [place]/  . TOTAL HIP ARTHROPLASTY Left 11/28/2012   Procedure: TOTAL HIP ARTHROPLASTY;  Surgeon: Kerin Salen, MD;  Location: [facility];  Service: Orthopedics;  Laterality: Left;    reports that she quit smoking about 34 years ago. Her smoking use included cigarettes. She has a 50.00 pack-year smoking history. She has never used smokeless tobacco. She reports that she does not drink alcohol or use drugs. family history includes Diabetes in her mother; Heart attack in her father; Heart disease in her father; Hypertension in her mother; Multiple sclerosis in her  father. Allergies  Allergen Reactions  . Celecoxib     swell  . Enalapril     cough  . Losartan     falls  . Lovastatin     REACTION: aches  . Naproxen   . Pravachol [Pravastatin Sodium]     Nausea    Current Outpatient Medications on File Prior to Visit  Medication Sig Dispense Refill  . amoxicillin (AMOXIL) 500 MG capsule as needed.    [name] aspirin EC 81 MG tablet Take 1 tablet (81 mg total) by mouth daily. 100 tablet 3  . B Complex-C (SUPER B COMPLEX PO) Take by mouth daily.    . Cholecalciferol (VITAMIN D) 1000 UNITS capsule Take 1 capsule (1,000 Units total) by mouth daily. 100 capsule 3  . citalopram (CELEXA) 40 MG tablet Take 1 tablet (40 mg total) by mouth daily. 90 tablet 3  . clotrimazole-betamethasone (LOTRISONE) cream Apply 1 application topically 2 (two) times daily. (Patient taking differently: Apply 1 application topically as needed. ) 45 g 1  . diphenhydrAMINE (BENADRYL) 25 MG tablet Take 25 mg by mouth at bedtime as needed.     . ezetimibe (ZETIA) 10 MG tablet Take 1 tablet (10 mg total) by mouth daily. 90 tablet 3  . Ferrous Sulfate (IRON) 325 (65 Fe) MG TABS Take 1 tablet by  mouth 3 (three) times a week.    . fluticasone (FLONASE) 50 MCG/ACT nasal spray Place 1 spray into both nostrils daily as needed for allergies or rhinitis.    . furosemide (LASIX) 40 MG tablet Take 1 tablet (40 mg total) by mouth daily as needed. 90 tablet 3  . furosemide (LASIX) 40 MG tablet TAKE 1 TABLET DAILY AS     NEEDED 90 tablet 3  . ibuprofen (ADVIL,MOTRIN) 600 MG tablet Take 1 tablet (600 mg total) by mouth 2 (two) times daily as needed. 60 tablet 3  . loratadine (CLARITIN) 10 MG tablet Take 10 mg by mouth daily.    [name] LORazepam (ATIVAN) 1 MG tablet Take 1 tablet (1 mg total) by mouth 2 (two) times daily as needed for anxiety. 30 tablet 1  . metFORMIN (GLUCOPHAGE) 500 MG tablet Take 1 tablet (500 mg total) by mouth 2 (two) times daily with a meal. 180 tablet 3  . Omega-3 Fatty Acids (FISH  OIL) 1200 MG CAPS Take 2,400 mg by mouth 2 (two) times daily.    [name] triamcinolone cream (KENALOG) 0.5 % Apply 1 application topically 3 (three) times daily as needed (to rash). 90 g 3  . phentermine (ADIPEX-P) 37.5 MG tablet Take 1 tablet (37.5 mg total) by mouth daily before breakfast. 30 tablet 2  . spironolactone (ALDACTONE) 50 MG tablet Take 1 tablet (50 mg total) by mouth daily. 90 tablet 3   Current Facility-Administered Medications on File Prior to Visit  Medication Dose Route Frequency Provider Last Rate Last Dose  . 0.9 %  sodium chloride infusion  500 mL Intravenous Continuous Pyrtle, Lajuan Lines, MD      . 0.9 %  sodium chloride infusion  500 mL Intravenous Continuous Pyrtle, Lajuan Lines, MD      . dextrose 5 % solution   Intravenous Continuous Pyrtle, Lajuan Lines, MD       Review of Systems  Constitutional: Negative for other unusual diaphoresis or sweats HENT: Negative for ear discharge or swelling Eyes: Negative for other worsening visual disturbances Respiratory: Negative for stridor or other swelling  Gastrointestinal: Negative for worsening distension or other blood Genitourinary: Negative for retention or other urinary change Musculoskeletal: Negative for other MSK pain or swelling Skin: Negative for color change or other new lesions Neurological: Negative for worsening tremors and other numbness  Psychiatric/Behavioral: Negative for worsening agitation or other fatigue All other system neg per pt    Objective:   Physical Exam Blood pressure 122/78, pulse 81, temperature 98.3 F (36.8 C), temperature source Oral, height 5\' 4"  (1.626 m), weight 192 lb (87.1 kg), SpO2 97 %.  s VS noted,  Constitutional: Pt appears in NAD HENT: Head: NCAT.  Right Ear: External ear normal.  Left Ear: External ear normal.  Eyes: . Pupils are equal, round, and reactive to light. Conjunctivae and EOM are normal Nose: without d/c or deformity Neck: Neck supple. Gross normal ROM Cardiovascular: Normal rate  and regular rhythm.   Pulmonary/Chest: Effort normal and breath sounds without rales or wheezing.  Abd:  Soft, NT, ND, + BS, no organomegaly Spine:  Tender left lumbar paravertebral Right knee with marked degenerative change and small effusion noted Left hip with reduced ROM on flexion with pain on internal rotation Neurological: Pt is alert. At baseline orientation, motor 5/5 intact except 4/5 only LLE with reduced sensation by LT distal Skin: Skin is warm. No rashes, other new lesions, no LE edema Psychiatric: Pt behavior is normal without agitation ,  mild nervous No other exam findings    Assessment & Plan:

## 2017-04-29 DIAGNOSIS — G4733 Obstructive sleep apnea (adult) (pediatric): Secondary | ICD-10-CM | POA: Diagnosis not present

## 2017-04-30 NOTE — Assessment & Plan Note (Signed)
With chronic pain but now worsening weakness and numbness, likely neuritic related, for MRI LS Spine, declines gabapentin, to f/u sports medicine

## 2017-04-30 NOTE — Assessment & Plan Note (Signed)
Overall stable, has not been previously evaluated, suspect underlying DJD - pt to f/u with sports med in this office

## 2017-04-30 NOTE — Assessment & Plan Note (Signed)
BP Readings from Last 3 Encounters:  04/27/17 122/78  01/07/17 112/70  10/01/16 108/62  stable overall by history and exam, recent data reviewed with pt, and pt to continue medical treatment as before,  to f/u any worsening symptoms or concerns

## 2017-04-30 NOTE — Assessment & Plan Note (Signed)
With acute on chronic pain without falls, likely DJD related,  encouraged f/u with sport med in this office

## 2017-05-07 ENCOUNTER — Encounter: Payer: Self-pay | Admitting: Internal Medicine

## 2017-05-07 ENCOUNTER — Other Ambulatory Visit (INDEPENDENT_AMBULATORY_CARE_PROVIDER_SITE_OTHER): Payer: Medicare HMO

## 2017-05-07 ENCOUNTER — Ambulatory Visit (INDEPENDENT_AMBULATORY_CARE_PROVIDER_SITE_OTHER): Payer: Medicare HMO | Admitting: Internal Medicine

## 2017-05-07 ENCOUNTER — Encounter

## 2017-05-07 ENCOUNTER — Other Ambulatory Visit: Payer: Self-pay

## 2017-05-07 DIAGNOSIS — I10 Essential (primary) hypertension: Secondary | ICD-10-CM

## 2017-05-07 DIAGNOSIS — E785 Hyperlipidemia, unspecified: Secondary | ICD-10-CM

## 2017-05-07 DIAGNOSIS — E538 Deficiency of other specified B group vitamins: Secondary | ICD-10-CM

## 2017-05-07 DIAGNOSIS — Z6834 Body mass index (BMI) 34.0-34.9, adult: Secondary | ICD-10-CM | POA: Diagnosis not present

## 2017-05-07 DIAGNOSIS — E119 Type 2 diabetes mellitus without complications: Secondary | ICD-10-CM | POA: Diagnosis not present

## 2017-05-07 DIAGNOSIS — E1165 Type 2 diabetes mellitus with hyperglycemia: Secondary | ICD-10-CM

## 2017-05-07 DIAGNOSIS — E6609 Other obesity due to excess calories: Secondary | ICD-10-CM

## 2017-05-07 DIAGNOSIS — D582 Other hemoglobinopathies: Secondary | ICD-10-CM

## 2017-05-07 DIAGNOSIS — F411 Generalized anxiety disorder: Secondary | ICD-10-CM

## 2017-05-07 DIAGNOSIS — R69 Illness, unspecified: Secondary | ICD-10-CM | POA: Diagnosis not present

## 2017-05-07 LAB — BASIC METABOLIC PANEL
BUN: 20 mg/dL (ref 6–23)
CHLORIDE: 101 meq/L (ref 96–112)
CO2: 30 meq/L (ref 19–32)
Calcium: 9.4 mg/dL (ref 8.4–10.5)
Creatinine, Ser: 0.95 mg/dL (ref 0.40–1.20)
GFR: 61.48 mL/min (ref >60.00)
GLUCOSE: 108 mg/dL — AB (ref 70–99)
POTASSIUM: 3.7 meq/L (ref 3.5–5.1)
Sodium: 141 mEq/L (ref 135–145)

## 2017-05-07 LAB — URINALYSIS, ROUTINE W REFLEX MICROSCOPIC
Bilirubin Urine: NEGATIVE
Ketones, ur: NEGATIVE
Nitrite: NEGATIVE
SPECIFIC GRAVITY, URINE: 1.02 (ref 1.000–1.030)
Total Protein, Urine: NEGATIVE
URINE GLUCOSE: NEGATIVE
UROBILINOGEN UA: 0.2 (ref 0.0–1.0)
pH: 6.5 (ref 5.0–8.0)

## 2017-05-07 LAB — CBC WITH DIFFERENTIAL/PLATELET
BASOS ABS: 0.1 10*3/uL (ref 0.0–0.1)
Basophils Relative: 0.9 % (ref 0.0–3.0)
Eosinophils Absolute: 0.2 10*3/uL (ref 0.0–0.7)
Eosinophils Relative: 3.1 % (ref 0.0–5.0)
HCT: 41.6 % (ref 36.0–46.0)
Hemoglobin: 14.2 g/dL (ref 12.0–15.0)
LYMPHS ABS: 2.1 10*3/uL (ref 0.7–4.0)
Lymphocytes Relative: 36 % (ref 12.0–46.0)
MCHC: 34.2 g/dL (ref 30.0–36.0)
MCV: 91.2 fl (ref 78.0–100.0)
MONOS PCT: 10.9 % (ref 3.0–12.0)
Monocytes Absolute: 0.6 10*3/uL (ref 0.1–1.0)
NEUTROS PCT: 49.1 % (ref 43.0–77.0)
Neutro Abs: 2.8 10*3/uL (ref 1.4–7.7)
Platelets: 224 10*3/uL (ref 150.0–400.0)
RBC: 4.56 Mil/uL (ref 3.87–5.11)
RDW: 12.5 % (ref 11.5–15.5)
WBC: 5.8 10*3/uL (ref 4.0–10.5)

## 2017-05-07 LAB — HEMOGLOBIN A1C: Hgb A1c MFr Bld: 5.7 % (ref 4.6–6.5)

## 2017-05-07 LAB — LIPID PANEL
CHOL/HDL RATIO: 3
Cholesterol: 165 mg/dL (ref 0–200)
HDL: 56.5 mg/dL (ref >39.00)
LDL Cholesterol: 94 mg/dL (ref 0–99)
NONHDL: 108.66
TRIGLYCERIDES: 72 mg/dL (ref 0.0–149.0)
VLDL: 14.4 mg/dL (ref 0.0–40.0)

## 2017-05-07 LAB — VITAMIN B12: Vitamin B-12: 1126 pg/mL — ABNORMAL HIGH (ref 211–911)

## 2017-05-07 NOTE — Assessment & Plan Note (Addendum)
Celexa stress w/brother in [place], [place] - CAD, ESRD

## 2017-05-07 NOTE — Assessment & Plan Note (Signed)
Zetia

## 2017-05-07 NOTE — Assessment & Plan Note (Signed)
Spironaolactone Furosemide prn 

## 2017-05-07 NOTE — Progress Notes (Addendum)
Subjective:  Patient ID: Tiffany Gibbs, female    DOB: 02-22-1945  Age: 72 y.o. MRN: [id number]  CC: No chief complaint on file.   HPI Tiffany Gibbs presents for HTN, DM C/o stress w/brother in [place], [place] - CAD, ESRD  Outpatient Medications Prior to Visit  Medication Sig Dispense Refill  . amoxicillin (AMOXIL) 500 MG capsule as needed.    [name] aspirin EC 81 MG tablet Take 81 mg by mouth daily.    . Cholecalciferol (VITAMIN D) 1000 UNITS capsule Take 1 capsule (1,000 Units total) by mouth daily. 100 capsule 3  . citalopram (CELEXA) 40 MG tablet Take 1 tablet (40 mg total) by mouth daily. 90 tablet 3  . clotrimazole-betamethasone (LOTRISONE) cream Apply 1 application topically 2 (two) times daily. (Patient taking differently: Apply 1 application topically as needed. ) 45 g 1  . Cyanocobalamin (B-12) 2500 MCG SUBL Place under the tongue.    . diphenhydrAMINE (BENADRYL) 25 MG tablet Take 25 mg by mouth at bedtime as needed.     . ezetimibe (ZETIA) 10 MG tablet Take 1 tablet (10 mg total) by mouth daily. 90 tablet 3  . Ferrous Sulfate (IRON) 325 (65 Fe) MG TABS Take 1 tablet by mouth 3 (three) times a week.    . fluticasone (FLONASE) 50 MCG/ACT nasal spray Place 1 spray into both nostrils daily as needed for allergies or rhinitis.    . furosemide (LASIX) 40 MG tablet Take 1 tablet (40 mg total) by mouth daily as needed. (Patient taking differently: Take 40 mg by mouth daily. ) 90 tablet 3  . ibuprofen (ADVIL,MOTRIN) 600 MG tablet Take 1 tablet (600 mg total) by mouth 2 (two) times daily as needed. 60 tablet 3  . loratadine (CLARITIN) 10 MG tablet Take 10 mg by mouth daily.    [name] LORazepam (ATIVAN) 1 MG tablet Take 1 tablet (1 mg total) by mouth 2 (two) times daily as needed for anxiety. 30 tablet 1  . metFORMIN (GLUCOPHAGE) 500 MG tablet Take 1 tablet (500 mg total) by mouth 2 (two) times daily with a meal. 180 tablet 3  . Omega-3 Fatty Acids (FISH OIL) 1200 MG CAPS Take 2,400 mg by mouth 2  (two) times daily.    . phentermine (ADIPEX-P) 37.5 MG tablet Take 1 tablet (37.5 mg total) by mouth daily before breakfast. 30 tablet 2  . spironolactone (ALDACTONE) 50 MG tablet Take 1 tablet (50 mg total) by mouth daily. 90 tablet 3  . aspirin EC 81 MG tablet Take 1 tablet (81 mg total) by mouth daily. 100 tablet 3  . B Complex-C (SUPER B COMPLEX PO) Take by mouth daily.    . furosemide (LASIX) 40 MG tablet TAKE 1 TABLET DAILY AS     NEEDED 90 tablet 3  . triamcinolone cream (KENALOG) 0.5 % Apply 1 application topically 3 (three) times daily as needed (to rash). 90 g 3   Facility-Administered Medications Prior to Visit  Medication Dose Route Frequency Provider Last Rate Last Dose  . 0.9 %  sodium chloride infusion  500 mL Intravenous Continuous Pyrtle, Lajuan Lines, MD      . 0.9 %  sodium chloride infusion  500 mL Intravenous Continuous Pyrtle, Lajuan Lines, MD      . dextrose 5 % solution   Intravenous Continuous Pyrtle, Lajuan Lines, MD        ROS Review of Systems  Constitutional: Negative for activity change, appetite change, chills, fatigue and unexpected weight change.  HENT: Negative for congestion, mouth sores and sinus pressure.   Eyes: Negative for visual disturbance.  Respiratory: Negative for cough and chest tightness.   Gastrointestinal: Negative for abdominal pain and nausea.  Genitourinary: Negative for difficulty urinating, frequency and vaginal pain.  Musculoskeletal: Positive for arthralgias, back pain and gait problem.  Skin: Negative for pallor and rash.  Neurological: Negative for dizziness, tremors, weakness, numbness and headaches.  Psychiatric/Behavioral: Negative for confusion and sleep disturbance. The patient is nervous/anxious.     Objective:  BP 124/78 (BP Location: Right Arm, Patient Position: Sitting, Cuff Size: Large)   Pulse 64   Temp 98 F (36.7 C) (Oral)   Ht 5\' 4"  (1.626 m)   Wt 196 lb (88.9 kg)   SpO2 98%   BMI 33.64 kg/m   BP Readings from Last 3  Encounters:  05/07/17 124/78  04/27/17 122/78  01/07/17 112/70    Wt Readings from Last 3 Encounters:  05/07/17 196 lb (88.9 kg)  04/27/17 192 lb (87.1 kg)  01/07/17 186 lb (84.4 kg)    Physical Exam  Constitutional: She appears well-developed. No distress.  HENT:  Head: Normocephalic.  Right Ear: External ear normal.  Left Ear: External ear normal.  Nose: Nose normal.  Mouth/Throat: Oropharynx is clear and moist.  Eyes: Pupils are equal, round, and reactive to light. Conjunctivae are normal. Right eye exhibits no discharge. Left eye exhibits no discharge.  Neck: Normal range of motion. Neck supple. No JVD present. No tracheal deviation present. No thyromegaly present.  Cardiovascular: Normal rate, regular rhythm and normal heart sounds.  Pulmonary/Chest: No stridor. No respiratory distress. She has no wheezes.  Abdominal: Soft. Bowel sounds are normal. She exhibits no distension and no mass. There is no tenderness. There is no rebound and no guarding.  Musculoskeletal: She exhibits tenderness. She exhibits no edema.  Lymphadenopathy:    She has no cervical adenopathy.  Neurological: She displays normal reflexes. No cranial nerve deficit. She exhibits normal muscle tone. Coordination abnormal.  Skin: No rash noted. No erythema.  Psychiatric: She has a normal mood and affect. Her behavior is normal. Judgment and thought content normal.     LS - tender Obese  Lab Results  Component Value Date   WBC 5.8 05/07/2017   HGB 14.2 05/07/2017   HCT 41.6 05/07/2017   PLT 224.0 05/07/2017   GLUCOSE 108 (H) 05/07/2017   CHOL 165 05/07/2017   TRIG 72.0 05/07/2017   HDL 56.50 05/07/2017   LDLDIRECT 148.2 02/03/2013   LDLCALC 94 05/07/2017   ALT 14 12/31/2016   AST 16 12/31/2016   NA 141 05/07/2017   K 3.7 05/07/2017   CL 101 05/07/2017   CREATININE 0.95 05/07/2017   BUN 20 05/07/2017   CO2 30 05/07/2017   TSH 1.83 04/01/2016   INR 1.14 11/21/2012   HGBA1C 5.7 05/07/2017    MICROALBUR 1.3 12/31/2016    US Abdominal Aorta Screening Aaa  Result Date: 12/23/2015 CLINICAL DATA:  Screening EXAM: ULTRASOUND OF ABDOMINAL AORTA TECHNIQUE: Ultrasound examination of the abdominal aorta was performed to evaluate for abdominal aortic aneurysm. COMPARISON:  None. FINDINGS: Abdominal Aorta No aneurysm identified. Maximum Diameter: 1.7 cm proximally IMPRESSION: No evidence of abdominal aortic aneurysm. Electronically Signed   By: Rolm Baptise M.D.   On: 12/23/2015 10:06    Assessment & Plan:   There are no diagnoses linked to this encounter. I have discontinued Vern B. Eckert's B Complex-C (SUPER B COMPLEX PO) and triamcinolone cream. I am also  having her maintain her Vitamin D, Fish Oil, diphenhydrAMINE, loratadine, fluticasone, clotrimazole-betamethasone, ibuprofen, spironolactone, amoxicillin, citalopram, Iron, phentermine, ezetimibe, metFORMIN, furosemide, LORazepam, aspirin EC, and B-12. We will continue to administer sodium chloride, sodium chloride, and dextrose.  No orders of the defined types were placed in this encounter.    Follow-up: No follow-ups on file.  Walker Kehr, MD

## 2017-05-07 NOTE — Assessment & Plan Note (Signed)
Labs

## 2017-05-07 NOTE — Assessment & Plan Note (Signed)
On B12 

## 2017-05-07 NOTE — Assessment & Plan Note (Signed)
Wt Readings from Last 3 Encounters:  05/07/17 196 lb (88.9 kg)  04/27/17 192 lb (87.1 kg)  01/07/17 186 lb (84.4 kg)    Go on diet

## 2017-05-07 NOTE — Assessment & Plan Note (Signed)
Doing fair 

## 2017-05-07 NOTE — Assessment & Plan Note (Signed)
Metformin 

## 2017-05-24 NOTE — Progress Notes (Signed)
[facility] [address], [place] [address] Phone: [phone] Subjective:      CC: Back pain and left hip pain.  FAO:[id number]  Tiffany Gibbs is a 72 y.o. female coming in with complaint of left hip, back and knee pain. Fell on her knees back in February. Had xrays taken at urgent care. Left knee "goes out" on her. 2 weeks ago the right knee burned.   Onset- Chronic Location- Knee Duration-seems to be chronic and also daily basis. Character-aching sensation Aggravating factors- Walking   Severity-6 out of 10.   Pain seems to be worse.  Does have known facet arthropathy of the back.  Had responded fairly well to conservative therapy over a year and a half ago.  Started having worsening symptoms again.  Affecting daily activities.  Feels that her narcotics are the only thing that seems to be helpful. Patient did have x-rays taken October 2017.  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  Past Medical History:  Diagnosis Date  . Anxiety   . Cataract   . Depression   . DM type 2 (diabetes mellitus, type 2) ([place])   . Fibromyalgia   . H/O dizziness   . Hives    from tomatoes  . HTN (hypertension)   . Hyperlipidemia   . Low back pain   . OSA (obstructive sleep apnea)    uses c-pap  . Osteoarthritis   . Osteoporosis   . SOB (shortness of breath) on exertion    Past Surgical History:  Procedure Laterality Date  . cervical cryotherapy     cervix  . EYE SURGERY     Cataracts/Bil  . LASIK Bilateral   . TOTAL ABDOMINAL HYSTERECTOMY    . TOTAL HIP ARTHROPLASTY  09/2008   right- [facility]/  . TOTAL HIP ARTHROPLASTY Left 11/28/2012   Procedure: TOTAL HIP ARTHROPLASTY;  Surgeon: Kerin Salen, MD;  Location: [facility];  Service: Orthopedics;  Laterality: Left;   Social History   Socioeconomic History  . Marital status: Married    Spouse name: Sam  . Number of children: 1  . Years of education: 71   . Highest  education level: Not on file  Occupational History  . Occupation: N/A  Social Needs  . Financial resource strain: Not on file  . Food insecurity:    Worry: Not on file    Inability: Not on file  . Transportation needs:    Medical: Not on file    Non-medical: Not on file  Tobacco Use  . Smoking status: Former Smoker    Packs/day: 2.00    Years: 25.00    Pack years: 50.00    Types: Cigarettes    Last attempt to quit: 01/13/1983    Years since quitting: 34.3  . Smokeless tobacco: Never Used  Substance and Sexual Activity  . Alcohol use: No    Alcohol/week: 0.0 oz  . Drug use: No  . Sexual activity: Yes  Lifestyle  . Physical activity:    Days per week: Not on file    Minutes per session: Not on file  . Stress: Not on file  Relationships  . Social connections:    Talks on phone: Not on file    Gets together: Not on file    Attends religious service: Not on file    Active member of club or organization: Not on file    Attends meetings of clubs or organizations:  Not on file    Relationship status: Not on file  Other Topics Concern  . Not on file  Social History Narrative   Was divorced - newly re-married as of 2009, Sam   1 son      Caffeine use- coffee 1 cup daily    Allergies  Allergen Reactions  . Celecoxib     swell  . Enalapril     cough  . Losartan     falls  . Lovastatin     REACTION: aches  . Naproxen   . Pravachol [Pravastatin Sodium]     Nausea    Family History  Problem Relation Age of Onset  . Hypertension Mother   . Diabetes Mother   . Heart disease Father   . Multiple sclerosis Father   . Heart attack Father   . Kidney disease Brother 61       ESRD  . Heart attack Brother      Past medical history, social, surgical and family history all reviewed in electronic medical record.  No pertanent information unless stated regarding to the chief complaint.   Review of Systems:Review of systems updated and as accurate as of 05/25/17  No  headache, visual changes, nausea, vomiting, diarrhea, constipation, dizziness, abdominal pain, skin rash, fevers, chills, night sweats, weight loss, swollen lymph nodes, body aches, , chest pain, shortness of breath, mood changes.  Positive muscle aches, joint swelling  Objective  Blood pressure 118/68, pulse 73, height 5\' 4"  (1.626 m), weight 184 lb (83.5 kg), SpO2 98 %. Systems examined below as of 05/25/17   General: No apparent distress alert and oriented x3 mood and affect normal, dressed appropriately.  HEENT: Pupils equal, extraocular movements intact  Respiratory: Patient's speak in full sentences and does not appear short of breath  Cardiovascular: No lower extremity edema, non tender, no erythema  Skin: Warm dry intact with no signs of infection or rash on extremities or on axial skeleton.  Abdomen: Soft nontender  Neuro: Cranial nerves II through XII are intact, neurovascularly intact in all extremities with 2+ DTRs and 2+ pulses.  Lymph: No lymphadenopathy of posterior or anterior cervical chain or axillae bilaterally.  Gait mild antalgic MSK:  tender with full range of motion and good stability and symmetric strength and tone of shoulders, elbows, wrist, , and ankles bilaterally.  Bilateral hip replacement Knee: Bilateral valgus deformity noted. Large thigh to calf ratio.  Tender to palpation over medial and PF joint line.  ROM full in flexion and extension and lower leg rotation. instability with valgus force.  painful patellar compression. Patellar glide with moderate crepitus. Patellar and quadriceps tendons unremarkable. Hamstring and quadriceps strength is normal.   Back exam shows the patient only has 10 degrees of extension.  25 degrees of sidebending and rotation bilaterally.  Positive Faber test with severe tenderness to palpation over the right sacroiliac joint.  Negative straight leg test.  Deep tendon reflexes intact.  Procedure: Real-time Ultrasound Guided  Injection of right sacroiliac joint Device: GE Logiq Q7 Ultrasound guided injection is preferred based studies that show increased duration, increased effect, greater accuracy, decreased procedural pain, increased response rate, and decreased cost with ultrasound guided versus blind injection.  Verbal informed consent obtained.  Time-out conducted.  Noted no overlying erythema, induration, or other signs of local infection.  Skin prepped in a sterile fashion.  Local anesthesia: Topical Ethyl chloride.  With sterile technique and under real time ultrasound guidance: With a 21-gauge 2 inch needle  patient was injected with 1 cc of 0.5% Marcaine and 1 cc of Kenalog 40 mg/mL Completed without difficulty  Pain immediately resolved suggesting accurate placement of the medication.  Advised to call if fevers/chills, erythema, induration, drainage, or persistent bleeding.  Images permanently stored and available for review in the ultrasound unit.  Impression: Technically successful ultrasound guided injection.     Impression and Recommendations:     This case required medical decision making of moderate complexity.      Note: This dictation was prepared with Dragon dictation along with smaller phrase technology. Any transcriptional errors that result from this process are unintentional.

## 2017-05-25 ENCOUNTER — Encounter: Payer: Self-pay | Admitting: Family Medicine

## 2017-05-25 ENCOUNTER — Ambulatory Visit: Payer: Self-pay

## 2017-05-25 ENCOUNTER — Ambulatory Visit (INDEPENDENT_AMBULATORY_CARE_PROVIDER_SITE_OTHER): Payer: Medicare HMO | Admitting: Family Medicine

## 2017-05-25 VITALS — BP 118/68 | HR 73 | Ht 64.0 in | Wt 184.0 lb

## 2017-05-25 DIAGNOSIS — M51369 Other intervertebral disc degeneration, lumbar region without mention of lumbar back pain or lower extremity pain: Secondary | ICD-10-CM

## 2017-05-25 DIAGNOSIS — M47818 Spondylosis without myelopathy or radiculopathy, sacral and sacrococcygeal region: Secondary | ICD-10-CM | POA: Diagnosis not present

## 2017-05-25 DIAGNOSIS — M170 Bilateral primary osteoarthritis of knee: Secondary | ICD-10-CM | POA: Diagnosis not present

## 2017-05-25 DIAGNOSIS — M5136 Other intervertebral disc degeneration, lumbar region: Secondary | ICD-10-CM | POA: Diagnosis not present

## 2017-05-25 DIAGNOSIS — M25552 Pain in left hip: Secondary | ICD-10-CM | POA: Diagnosis not present

## 2017-05-25 DIAGNOSIS — M461 Sacroiliitis, not elsewhere classified: Secondary | ICD-10-CM

## 2017-05-25 MED ORDER — VITAMIN D (ERGOCALCIFEROL) 1.25 MG (50000 UNIT) PO CAPS: 50000.0000 [IU] | ORAL_CAPSULE | ORAL | 0 refills | Status: DC

## 2017-05-25 MED ORDER — GABAPENTIN 100 MG PO CAPS: 200.0000 mg | ORAL_CAPSULE | Freq: Every day | ORAL | 3 refills | Status: DC

## 2017-05-25 NOTE — Patient Instructions (Addendum)
Good to see you  Tiffany Gibbs is your friend. Ice 20 minutes 2 times daily. Usually after activity and before bed. Exercises 3 times a week.  pennsaid pinkie amount topically 2 times daily as needed. Use on the knees  Once weekly vitamin D for 12 weeks Tried an SI injection I hope helps.  Over the counter try Tart cherry extract any dose at night See me again in 4-6 weeks

## 2017-05-25 NOTE — Assessment & Plan Note (Signed)
Bilateral arthritis.  Discussed icing regimen and home exercise.  Which activities to do which wants to avoid.  Patient is to increase activity slowly.  Follow-up again 4 to 6 weeks.

## 2017-05-25 NOTE — Assessment & Plan Note (Signed)
Attempted sacroiliac joint injection today.  Discussed home exercises and given home exercises again.  Discussed icing regimen.  Discussed which activities to do which wants to avoid.

## 2017-05-27 ENCOUNTER — Telehealth: Payer: Self-pay | Admitting: Family Medicine

## 2017-05-27 MED ORDER — VITAMIN D (ERGOCALCIFEROL) 1.25 MG (50000 UNIT) PO CAPS: 50000.0000 [IU] | ORAL_CAPSULE | ORAL | 0 refills | Status: DC

## 2017-05-27 NOTE — Telephone Encounter (Signed)
Printed Patient can come pick it up

## 2017-05-27 NOTE — Telephone Encounter (Signed)
Copied from [place] [id number]. Topic: Quick Communication - See Telephone Encounter >> May 27, 2017  9:38 AM Ahmed Prima L wrote: CRM for notification. See Telephone encounter for: 05/27/17.  Patient states that [facility] will not tell her how much Vitamin D, Ergocalciferol, (DRISDOL) 50000 units CAPS capsule will be. She told them to cancel it. She wants Dr Tamala Julian to send her a prescription in the mail for her to take to a pharmacy of her choice. Please advise

## 2017-05-28 LAB — HM DIABETES EYE EXAM

## 2017-05-28 MED ORDER — VITAMIN D (ERGOCALCIFEROL) 1.25 MG (50000 UNIT) PO CAPS: 50000.0000 [IU] | ORAL_CAPSULE | ORAL | 0 refills | Status: DC

## 2017-05-31 DIAGNOSIS — G4733 Obstructive sleep apnea (adult) (pediatric): Secondary | ICD-10-CM | POA: Diagnosis not present

## 2017-06-02 ENCOUNTER — Other Ambulatory Visit: Payer: Self-pay | Admitting: Internal Medicine

## 2017-06-02 NOTE — Telephone Encounter (Signed)
Patient is requesting an increased number of pills with her prescription of Ativan.

## 2017-06-02 NOTE — Telephone Encounter (Signed)
Please advise 

## 2017-06-02 NOTE — Telephone Encounter (Signed)
Copied from [place] [id number]. Topic: Quick Communication - Rx Refill/Question >> Jun 02, 2017  9:28 AM Percell Belt A wrote: Medication: LORazepam (ATIVAN) 1 MG tablet [[id number]]- she would like 60 tabs. She gets a better deal with ins   Has the patient contacted their pharmacy? No  (Agent: If no, request that the patient contact the pharmacy for the refill.) (Agent: If yes, when and what did the pharmacy advise?)  Preferred Pharmacy (with phone number or street name):  [facility] [address] [phone]  Agent: Please be advised that RX refills may take up to 3 business days. We ask that you follow-up with your pharmacy.

## 2017-06-03 ENCOUNTER — Encounter: Payer: Self-pay | Admitting: Nurse Practitioner

## 2017-06-03 MED ORDER — LORAZEPAM 1 MG PO TABS: 1.0000 mg | ORAL_TABLET | Freq: Two times a day (BID) | ORAL | 3 refills | Status: DC | PRN

## 2017-06-03 NOTE — Progress Notes (Addendum)
[facility]  PATIENT: Tiffany Gibbs DOB: November 27, 1945   REASON FOR VISIT: Follow-up for obstructive sleep apnea with CPAP HISTORY FROM: Patient   HPI UPDATE 5/24/2019CM Tiffany Gibbs, 72 year old female returns for follow-up with history of obstructive sleep apnea using CPAP.  She claims she is only missed one day and that was when her brother was in the hospital.  Compliance data dated 05/05/2017-06/03/2017 shows compliance greater than 4 hours at 97%.  Average usage 7 hours 56 minutes.  Set pressure 11 cm.  EPR level 3 AHI 0.4.  Leak 95th percentile at 30.8 ESS 6 she returns for reevaluation   UPDATE 06/09/16 CMMs. [place], 72 year old female returns for follow-up with history of obstructive sleep apnea using CPAP.  Her compliance data for greater than 4 hours is 100% for 30 days . Average usage 8 hours 7 minutes set pressure 11 cm EPR 3 AHI 0.5. She needs new supplies mask headpiece and toes. ESS score today is 3. She returns for reevaluation   05/08/15 SAReevaluation of her prior diagnosis of OSA. The patient is unaccompanied today. As you know, Tiffany Gibbs is a 72 year old right-handed woman with an underlying medical history of anxiety, depression, hypertension, cataracts, osteoarthritis, type 2 diabetes, low back pain, osteoporosis, hyperlipidemia, fibromyalgia, status post right total hip arthroplasty, abdominal hysterectomy, and obesity, who was previously diagnosed with obstructive sleep apnea many years ago, over 10 years ago. She had a sleep study at [facility] on 03/22/2001 which was interpreted by Dr. Danton Sewer. I reviewed the test results: Overall AHI was 68 per hour, REM sleep was not achieved. Lowest oxygen saturation was 87%. She had a CPAP titration study on 06/02/2001 also with on hospital. CPAP was titrated to 10 cm.She has to take care of her husband who is 63 years older and suffers from dementia and other physical disabilities   REVIEW OF SYSTEMS: Full 14  system review of systems performed and notable only for those listed, all others are neg:  Constitutional: neg  Cardiovascular: neg Ear/Nose/Throat: Hearing loss Skin: neg Eyes: Light sensitivity Respiratory: neg Gastroitestinal: neg  Hematology/Lymphatic: neg  Endocrine: neg Musculoskeletal:neg Allergy/Immunology: neg Neurological: neg Psychiatric: neg Sleep : Obstructive sleep apnea with CPAP ALLERGIES: Allergies  Allergen Reactions  . Celecoxib     swell  . Enalapril     cough  . Losartan     falls  . Lovastatin     REACTION: aches  . Naproxen   . Pravachol [Pravastatin Sodium]     Nausea     HOME MEDICATIONS: Outpatient Medications Prior to Visit  Medication Sig Dispense Refill  . amoxicillin (AMOXIL) 500 MG capsule as needed.    [name] aspirin EC 81 MG tablet Take 81 mg by mouth daily.    . Cholecalciferol (VITAMIN D) 1000 UNITS capsule Take 1 capsule (1,000 Units total) by mouth daily. 100 capsule 3  . citalopram (CELEXA) 40 MG tablet Take 1 tablet (40 mg total) by mouth daily. 90 tablet 3  . clotrimazole-betamethasone (LOTRISONE) cream Apply 1 application topically 2 (two) times daily. (Patient taking differently: Apply 1 application topically as needed. ) 45 g 1  . Cyanocobalamin (B-12) 2500 MCG SUBL Place under the tongue.    . diphenhydrAMINE (BENADRYL) 25 MG tablet Take 25 mg by mouth at bedtime as needed.     . ezetimibe (ZETIA) 10 MG tablet Take 1 tablet (10 mg total) by mouth daily. 90 tablet 3  . Ferrous Sulfate (IRON) 325 (65 Fe) MG TABS  Take 1 tablet by mouth 3 (three) times a week.    . fluticasone (FLONASE) 50 MCG/ACT nasal spray Place 1 spray into both nostrils daily as needed for allergies or rhinitis.    . furosemide (LASIX) 40 MG tablet Take 1 tablet (40 mg total) by mouth daily as needed. (Patient taking differently: Take 40 mg by mouth daily. ) 90 tablet 3  . ibuprofen (ADVIL,MOTRIN) 600 MG tablet Take 1 tablet (600 mg total) by mouth 2 (two) times  daily as needed. 60 tablet 3  . loratadine (CLARITIN) 10 MG tablet Take 10 mg by mouth daily.    [name] LORazepam (ATIVAN) 1 MG tablet Take 1 tablet (1 mg total) by mouth 2 (two) times daily as needed for anxiety. 60 tablet 3  . metFORMIN (GLUCOPHAGE) 500 MG tablet Take 1 tablet (500 mg total) by mouth 2 (two) times daily with a meal. 180 tablet 3  . Omega-3 Fatty Acids (FISH OIL) 1200 MG CAPS Take 2,400 mg by mouth 2 (two) times daily.    . Vitamin D, Ergocalciferol, (DRISDOL) 50000 units CAPS capsule Take 1 capsule (50,000 Units total) by mouth every 7 (seven) days. 12 capsule 0  . gabapentin (NEURONTIN) 100 MG capsule Take 2 capsules (200 mg total) by mouth at bedtime. (Patient not taking: Reported on 06/04/2017) 60 capsule 3  . phentermine (ADIPEX-P) 37.5 MG tablet Take 1 tablet (37.5 mg total) by mouth daily before breakfast. 30 tablet 2  . spironolactone (ALDACTONE) 50 MG tablet Take 1 tablet (50 mg total) by mouth daily. 90 tablet 3   Facility-Administered Medications Prior to Visit  Medication Dose Route Frequency Provider Last Rate Last Dose  . 0.9 %  sodium chloride infusion  500 mL Intravenous Continuous Pyrtle, Lajuan Lines, MD      . 0.9 %  sodium chloride infusion  500 mL Intravenous Continuous Pyrtle, Lajuan Lines, MD        PAST MEDICAL HISTORY: Past Medical History:  Diagnosis Date  . Anxiety   . Cataract   . Depression   . DM type 2 (diabetes mellitus, type 2) ([place])   . Fibromyalgia   . H/O dizziness   . Hives    from tomatoes  . HTN (hypertension)   . Hyperlipidemia   . Low back pain   . OSA (obstructive sleep apnea)    uses c-pap  . Osteoarthritis   . Osteoporosis   . SOB (shortness of breath) on exertion     PAST SURGICAL HISTORY: Past Surgical History:  Procedure Laterality Date  . cervical cryotherapy     cervix  . EYE SURGERY     Cataracts/Bil  . LASIK Bilateral   . TOTAL ABDOMINAL HYSTERECTOMY    . TOTAL HIP ARTHROPLASTY  09/2008   right- [facility]/  . TOTAL HIP  ARTHROPLASTY Left 11/28/2012   Procedure: TOTAL HIP ARTHROPLASTY;  Surgeon: Kerin Salen, MD;  Location: [facility];  Service: Orthopedics;  Laterality: Left;    FAMILY HISTORY: Family History  Problem Relation Age of Onset  . Hypertension Mother   . Diabetes Mother   . Heart disease Father   . Multiple sclerosis Father   . Heart attack Father   . Kidney disease Brother 8       ESRD  . Heart attack Brother     SOCIAL HISTORY: Social History   Socioeconomic History  . Marital status: Married    Spouse name: Sam  . Number of children: 1  . Years of education:  12   . Highest education level: Not on file  Occupational History  . Occupation: N/A  Social Needs  . Financial resource strain: Not on file  . Food insecurity:    Worry: Not on file    Inability: Not on file  . Transportation needs:    Medical: Not on file    Non-medical: Not on file  Tobacco Use  . Smoking status: Former Smoker    Packs/day: 2.00    Years: 25.00    Pack years: 50.00    Types: Cigarettes    Last attempt to quit: 01/13/1983    Years since quitting: 34.4  . Smokeless tobacco: Never Used  Substance and Sexual Activity  . Alcohol use: No    Alcohol/week: 0.0 oz  . Drug use: No  . Sexual activity: Yes  Lifestyle  . Physical activity:    Days per week: Not on file    Minutes per session: Not on file  . Stress: Not on file  Relationships  . Social connections:    Talks on phone: Not on file    Gets together: Not on file    Attends religious service: Not on file    Active member of club or organization: Not on file    Attends meetings of clubs or organizations: Not on file    Relationship status: Not on file  . Intimate partner violence:    Fear of current or ex partner: Not on file    Emotionally abused: Not on file    Physically abused: Not on file    Forced sexual activity: Not on file  Other Topics Concern  . Not on file  Social History Narrative   Was divorced - newly re-married as  of 2009, Sam   1 son      Caffeine use- coffee 1 cup daily      PHYSICAL EXAM  Vitals:   06/04/17 0913  BP: 103/60  Pulse: 70  SpO2: 98%  Weight: 190 lb 3.2 oz (86.3 kg)  Height: 5\' 4"  (1.626 m)   Body mass index is 32.65 kg/m.  Generalized: Well developed, Obese female in no acute distress  Head: normocephalic and atraumatic,. Oropharynx benign  Neck: Supple,  Musculoskeletal: No deformity   Neurological examination   Mentation: Alert oriented to time, place, history taking. Attention span and concentration appropriate. Recent and remote memory intact.  Follows all commands speech and language fluent. ESS 6.  Cranial nerve II-XII: Pupils were equal round reactive to light extraocular movements were full, visual field were full on confrontational test. Facial sensation and strength were normal. hearing was intact to finger rubbing bilaterally. Uvula tongue midline. head turning and shoulder shrug were normal and symmetric.Tongue protrusion into cheek strength was normal. Motor: normal bulk and tone, full strength in the BUE, BLE,  Sensory: normal and symmetric to light touch,  Coordination: finger-nose-finger, heel-to-shin bilaterally, no dysmetria Reflexes: Symmetric upper and lower, plantar responses were flexor bilaterally. Gait and Station: Rising up from seated position without assistance, normal stance,  moderate stride, good arm swing, smooth turning, able to perform tiptoe, and heel walking without difficulty. Tandem gait is steady. No assistive device  DIAGNOSTIC DATA (LABS, IMAGING, TESTING) - I reviewed patient records, labs, notes, testing and imaging myself where available.  Lab Results  Component Value Date   WBC 5.8 05/07/2017   HGB 14.2 05/07/2017   HCT 41.6 05/07/2017   MCV 91.2 05/07/2017   PLT 224.0 05/07/2017  Component Value Date/Time   NA 141 05/07/2017 0825   K 3.7 05/07/2017 0825   CL 101 05/07/2017 0825   CO2 30 05/07/2017 0825    GLUCOSE 108 (H) 05/07/2017 0825   BUN 20 05/07/2017 0825   CREATININE 0.95 05/07/2017 0825   CALCIUM 9.4 05/07/2017 0825   PROT 7.0 12/31/2016 1010   ALBUMIN 4.2 12/31/2016 1010   AST 16 12/31/2016 1010   ALT 14 12/31/2016 1010   ALKPHOS 44 12/31/2016 1010   BILITOT 0.5 12/31/2016 1010   GFRNONAA 73 (L) 11/29/2012 0725   GFRAA 85 (L) 11/29/2012 0725   Lab Results  Component Value Date   CHOL 165 05/07/2017   HDL 56.50 05/07/2017   LDLCALC 94 05/07/2017   LDLDIRECT 148.2 02/03/2013   TRIG 72.0 05/07/2017   CHOLHDL 3 05/07/2017   Lab Results  Component Value Date   HGBA1C 5.7 05/07/2017   Lab Results  Component Value Date   VITAMINB12 1,126 (H) 05/07/2017   Lab Results  Component Value Date   TSH 1.83 04/01/2016      ASSESSMENT AND PLAN  72 y.o. year old female  has a past medical history of ; OSA (obstructive sleep apnea); Here to follow-up for CPAP compliance.Data dated 05/05/2017-06/03/2017 shows compliance greater than 4 hours at 97%.  Average usage 7 hours 56 minutes.  Set pressure 11 cm.  EPR level 3 AHI 0.4.  Leak 95th percentile at 30.8 ESS 6   PLAN: CPAP compliance is 97% Continue same settings Has leak will order mask refit and supplies [facility] Follow-up yearly and when necessary as necessary. Dennie Bible, [facility], [facility], APRN  [facility] [address], [place], [place] [address] [phone]  I reviewed the above note and documentation by the Nurse Practitioner and agree with the history, physical exam, assessment and plan as outlined above. Star Age, MD, PhD [facility])

## 2017-06-03 NOTE — Telephone Encounter (Signed)
RX loaded

## 2017-06-03 NOTE — Telephone Encounter (Signed)
Pt states the LORazepam (ATIVAN) 1 MG tablet Was sent to the wrong pharmacy and she has cancelled the rx at [facility]. She would like the medication resent to  [facility] [address] [place] [phone] (Phone) [phone] (Fax)

## 2017-06-03 NOTE — Addendum Note (Signed)
Addended by: Karren Cobble on: 06/03/2017 04:33 PM   Modules accepted: Orders

## 2017-06-03 NOTE — Telephone Encounter (Signed)
Okay.  Will do.  Thank you 

## 2017-06-04 ENCOUNTER — Encounter: Payer: Self-pay | Admitting: Nurse Practitioner

## 2017-06-04 ENCOUNTER — Ambulatory Visit (INDEPENDENT_AMBULATORY_CARE_PROVIDER_SITE_OTHER): Payer: Medicare HMO | Admitting: Nurse Practitioner

## 2017-06-04 ENCOUNTER — Other Ambulatory Visit: Payer: Self-pay | Admitting: *Deleted

## 2017-06-04 ENCOUNTER — Telehealth: Payer: Self-pay | Admitting: *Deleted

## 2017-06-04 ENCOUNTER — Other Ambulatory Visit: Payer: Self-pay

## 2017-06-04 DIAGNOSIS — G4733 Obstructive sleep apnea (adult) (pediatric): Secondary | ICD-10-CM | POA: Diagnosis not present

## 2017-06-04 DIAGNOSIS — Z9989 Dependence on other enabling machines and devices: Secondary | ICD-10-CM

## 2017-06-04 MED ORDER — LORAZEPAM 1 MG PO TABS: 1.0000 mg | ORAL_TABLET | Freq: Two times a day (BID) | ORAL | 3 refills | Status: DC | PRN

## 2017-06-04 NOTE — Telephone Encounter (Addendum)
Patient states needs script [facility]

## 2017-06-04 NOTE — Telephone Encounter (Signed)
Rx phoned in to [facility].

## 2017-06-04 NOTE — Telephone Encounter (Signed)
Faxed signed order to [facility] re: CPAP needing mask adjustments. Fax: [phone]. Received fax confirmation.

## 2017-06-04 NOTE — Patient Instructions (Signed)
CPAP compliance is 97% Continue same settings Has leak will order mask refit  Follow-up yearly and when necessary as necessary.

## 2017-06-09 ENCOUNTER — Ambulatory Visit: Payer: Medicare HMO | Admitting: Nurse Practitioner

## 2017-06-22 ENCOUNTER — Ambulatory Visit: Payer: Medicare HMO | Admitting: Family Medicine

## 2017-06-28 NOTE — Progress Notes (Signed)
[facility] [place], [place] [address] Phone: [phone] Subjective:     CC: Bilateral hand and foot pain  NIO:[id number]  Tiffany Gibbs is a 72 y.o. female coming in with complaint of bilateral hand and foot pain.  Has been going on for some time.  Feels like there is some joint swelling and stiffness.  Has noted some unfortunately having increasing numbness.  Seems to be bilateral.  Seem to start at the toes initially and went to her ankles.  Same thing with her fingertips and down to her wrist.  Patient denies any true injury.  No new medications.  He is very aggravating but not debilitating.   Seen also 1 month ago.  Had sacroiliac injection.  States that her back pain is significantly improved  Past Medical History:  Diagnosis Date  . Anxiety   . Cataract   . Depression   . DM type 2 (diabetes mellitus, type 2) ([place])   . Fibromyalgia   . H/O dizziness   . Hives    from tomatoes  . HTN (hypertension)   . Hyperlipidemia   . Low back pain   . OSA (obstructive sleep apnea)    uses c-pap  . Osteoarthritis   . Osteoporosis   . SOB (shortness of breath) on exertion    Past Surgical History:  Procedure Laterality Date  . cervical cryotherapy     cervix  . EYE SURGERY     Cataracts/Bil  . LASIK Bilateral   . TOTAL ABDOMINAL HYSTERECTOMY    . TOTAL HIP ARTHROPLASTY  09/2008   right- [place]/  . TOTAL HIP ARTHROPLASTY Left 11/28/2012   Procedure: TOTAL HIP ARTHROPLASTY;  Surgeon: Kerin Salen, MD;  Location: [facility];  Service: Orthopedics;  Laterality: Left;   Social History   Socioeconomic History  . Marital status: Married    Spouse name: Sam  . Number of children: 1  . Years of education: 47   . Highest education level: Not on file  Occupational History  . Occupation: N/A  Social Needs  . Financial resource strain: Not on file  . Food insecurity:    Worry: Not on file    Inability: Not on file  . Transportation needs:      Medical: Not on file    Non-medical: Not on file  Tobacco Use  . Smoking status: Former Smoker    Packs/day: 2.00    Years: 25.00    Pack years: 50.00    Types: Cigarettes    Last attempt to quit: 01/13/1983    Years since quitting: 34.4  . Smokeless tobacco: Never Used  Substance and Sexual Activity  . Alcohol use: No    Alcohol/week: 0.0 oz  . Drug use: No  . Sexual activity: Yes  Lifestyle  . Physical activity:    Days per week: Not on file    Minutes per session: Not on file  . Stress: Not on file  Relationships  . Social connections:    Talks on phone: Not on file    Gets together: Not on file    Attends religious service: Not on file    Active member of club or organization: Not on file    Attends meetings of clubs or organizations: Not on file    Relationship status: Not on file  Other Topics Concern  . Not on file  Social History Narrative   Was divorced - newly re-married as of 2009,  Sam   1 son      Caffeine use- coffee 1 cup daily    Allergies  Allergen Reactions  . Celecoxib     swell  . Enalapril     cough  . Losartan     falls  . Lovastatin     REACTION: aches  . Naproxen   . Pravachol [Pravastatin Sodium]     Nausea    Family History  Problem Relation Age of Onset  . Hypertension Mother   . Diabetes Mother   . Heart disease Father   . Multiple sclerosis Father   . Heart attack Father   . Kidney disease Brother 48       ESRD  . Heart attack Brother      Past medical history, social, surgical and family history all reviewed in electronic medical record.  No pertanent information unless stated regarding to the chief complaint.   Review of Systems:Review of systems updated and as accurate as of 06/29/17  No headache, visual changes, nausea, vomiting, diarrhea, constipation, dizziness, abdominal pain, skin rash, fevers, chills, night sweats, weight loss, swollen lymph nodes,  chest pain, shortness of breath, mood changes.  Positive  muscle aches, body aches  Objective  Blood pressure 128/70, pulse 66, height 5\' 4"  (1.626 m), weight 189 lb (85.7 kg), SpO2 98 %. Systems examined below as of 06/29/17   General: No apparent distress alert and oriented x3 mood and affect normal, dressed appropriately.  HEENT: Pupils equal, extraocular movements intact  Respiratory: Patient's speak in full sentences and does not appear short of breath  Cardiovascular: No lower extremity edema, non tender, no erythema  Skin: Warm dry intact with no signs of infection or rash on extremities or on axial skeleton.  Abdomen: Soft nontender  Neuro: Cranial nerves II through XII are intact, neurovascularly intact in all extremities with 2+ DTRs and 2+ pulses.  Lymph: No lymphadenopathy of posterior or anterior cervical chain or axillae bilaterally.  Gait normal with good balance and coordination.  MSK:  Non tender with full range of motion and good stability and symmetric strength and tone of shoulders, elbows, wrist, hip, knee and ankles bilaterally.  arthritic changes of multiple joints With arthritic changes of the hands noted including the DIP.  Patient does have full range of motion.  Mild angulation of some of the fingertips.  Does seem to have some mild peripheral neuropathy noted  Foot exam shows overpronation of the hindfoot and breakdown longitudinal arch.  Patient does have mild stocking distribution of neuropathy.   Impression and Recommendations:     This case required medical decision making of moderate complexity.      Note: This dictation was prepared with Dragon dictation along with smaller phrase technology. Any transcriptional errors that result from this process are unintentional.

## 2017-06-29 ENCOUNTER — Ambulatory Visit (INDEPENDENT_AMBULATORY_CARE_PROVIDER_SITE_OTHER): Payer: Medicare HMO | Admitting: Family Medicine

## 2017-06-29 ENCOUNTER — Encounter: Payer: Self-pay | Admitting: Family Medicine

## 2017-06-29 DIAGNOSIS — G63 Polyneuropathy in diseases classified elsewhere: Secondary | ICD-10-CM | POA: Diagnosis not present

## 2017-06-29 DIAGNOSIS — E889 Metabolic disorder, unspecified: Secondary | ICD-10-CM | POA: Insufficient documentation

## 2017-06-29 MED ORDER — GABAPENTIN 100 MG PO CAPS: 200.0000 mg | ORAL_CAPSULE | Freq: Three times a day (TID) | ORAL | 6 refills | Status: DC

## 2017-06-29 MED ORDER — VITAMIN D (ERGOCALCIFEROL) 1.25 MG (50000 UNIT) PO CAPS: 50000.0000 [IU] | ORAL_CAPSULE | ORAL | 0 refills | Status: DC

## 2017-06-29 NOTE — Assessment & Plan Note (Signed)
Patient does have peripheral neuropathy.  Has not had significant improvement with her diabetes and I think this is contributing.  Discussed the importance of blood glucose control  Lab Results  Component Value Date   HGBA1C 5.7 05/07/2017  Discussed with patient about different medications.  Has taken gabapentin in the past but has only been doing it intermittently because of the back pain.  Back pain significant improved after the injection.  Encourage her to take the gabapentin on a more regular basis.  Discussed icing regimen and home exercise.  Discussed which activities to do which to avoid.  Follow-up again in 4 weeks

## 2017-06-29 NOTE — Patient Instructions (Addendum)
Good to see you  Tiffany Gibbs is your friend.  Refilled the vitamin D  Gabapentin 200mg  3 times a day could help  See me again in 2 months

## 2017-07-01 DIAGNOSIS — G4733 Obstructive sleep apnea (adult) (pediatric): Secondary | ICD-10-CM | POA: Diagnosis not present

## 2017-07-02 ENCOUNTER — Telehealth: Payer: Self-pay | Admitting: Internal Medicine

## 2017-07-02 NOTE — Telephone Encounter (Signed)
Copied from [place]. Topic: General - Other >> Jul 02, 2017 12:45 PM Lennox Solders wrote: Reason for CRM: pt is calling and requesting betty to her call . Pt has some question she would like betty to relate to md. Pt would like info on getting the new dm meter when u do not stick your finger

## 2017-07-08 NOTE — Telephone Encounter (Signed)
The patient needs to find out if the device/supplies are covered by her insurance.  Thank you

## 2017-07-08 NOTE — Telephone Encounter (Signed)
Pt would like to know your opinion on her starting the [facility]. She would also like to know if there are any medications she can come off of?

## 2017-07-20 NOTE — Telephone Encounter (Signed)
Pt will check with insurance and call back.

## 2017-08-02 ENCOUNTER — Telehealth: Payer: Self-pay | Admitting: Internal Medicine

## 2017-08-02 DIAGNOSIS — G4733 Obstructive sleep apnea (adult) (pediatric): Secondary | ICD-10-CM | POA: Diagnosis not present

## 2017-08-02 NOTE — Telephone Encounter (Signed)
Copied from [place] [phone]. Topic: General - Other >> Aug 02, 2017  3:31 PM Cecelia Byars, NT wrote: Reason for CRM: Patient called and said that Dr Lajuana Carry will need to call  in pre authorization for her to get the sensors for the [facility] at no cost please call her at [phone]   , she would like a call from [place] to confirm these at no cost to her

## 2017-08-05 ENCOUNTER — Ambulatory Visit: Payer: Medicare HMO | Admitting: Internal Medicine

## 2017-08-06 NOTE — Telephone Encounter (Signed)
LMTCB

## 2017-08-09 NOTE — Telephone Encounter (Signed)
Pt will discuss at appointment on 08/01 to get RX and then PA will be done

## 2017-08-12 ENCOUNTER — Other Ambulatory Visit (INDEPENDENT_AMBULATORY_CARE_PROVIDER_SITE_OTHER): Payer: Medicare HMO

## 2017-08-12 ENCOUNTER — Ambulatory Visit (INDEPENDENT_AMBULATORY_CARE_PROVIDER_SITE_OTHER): Payer: Medicare HMO | Admitting: Internal Medicine

## 2017-08-12 ENCOUNTER — Encounter: Payer: Self-pay | Admitting: Internal Medicine

## 2017-08-12 VITALS — BP 102/62 | HR 66 | Temp 97.8°F | Ht 64.0 in | Wt 187.0 lb

## 2017-08-12 DIAGNOSIS — F411 Generalized anxiety disorder: Secondary | ICD-10-CM | POA: Diagnosis not present

## 2017-08-12 DIAGNOSIS — G4733 Obstructive sleep apnea (adult) (pediatric): Secondary | ICD-10-CM

## 2017-08-12 DIAGNOSIS — E538 Deficiency of other specified B group vitamins: Secondary | ICD-10-CM

## 2017-08-12 DIAGNOSIS — I10 Essential (primary) hypertension: Secondary | ICD-10-CM

## 2017-08-12 DIAGNOSIS — D582 Other hemoglobinopathies: Secondary | ICD-10-CM

## 2017-08-12 DIAGNOSIS — E119 Type 2 diabetes mellitus without complications: Secondary | ICD-10-CM | POA: Diagnosis not present

## 2017-08-12 DIAGNOSIS — R69 Illness, unspecified: Secondary | ICD-10-CM | POA: Diagnosis not present

## 2017-08-12 DIAGNOSIS — Z0000 Encounter for general adult medical examination without abnormal findings: Secondary | ICD-10-CM | POA: Diagnosis not present

## 2017-08-12 DIAGNOSIS — E785 Hyperlipidemia, unspecified: Secondary | ICD-10-CM

## 2017-08-12 DIAGNOSIS — B351 Tinea unguium: Secondary | ICD-10-CM

## 2017-08-12 DIAGNOSIS — R413 Other amnesia: Secondary | ICD-10-CM

## 2017-08-12 DIAGNOSIS — G63 Polyneuropathy in diseases classified elsewhere: Secondary | ICD-10-CM

## 2017-08-12 DIAGNOSIS — E889 Metabolic disorder, unspecified: Secondary | ICD-10-CM

## 2017-08-12 LAB — HEMOGLOBIN A1C: Hgb A1c MFr Bld: 5.7 % (ref 4.6–6.5)

## 2017-08-12 LAB — BASIC METABOLIC PANEL
BUN: 18 mg/dL (ref 6–23)
CO2: 29 mEq/L (ref 19–32)
Calcium: 9.3 mg/dL (ref 8.4–10.5)
Chloride: 102 mEq/L (ref 96–112)
Creatinine, Ser: 1.06 mg/dL (ref 0.40–1.20)
GFR: 54.14 mL/min — AB (ref >60.00)
GLUCOSE: 100 mg/dL — AB (ref 70–99)
POTASSIUM: 3.3 meq/L — AB (ref 3.5–5.1)
SODIUM: 141 meq/L (ref 135–145)

## 2017-08-12 MED ORDER — VITAMIN D (ERGOCALCIFEROL) 1.25 MG (50000 UNIT) PO CAPS: 50000.0000 [IU] | ORAL_CAPSULE | ORAL | 4 refills | Status: DC

## 2017-08-12 MED ORDER — CICLOPIROX 8 % EX SOLN: Freq: Every day | CUTANEOUS | 0 refills | Status: DC

## 2017-08-12 MED ORDER — LORAZEPAM 1 MG PO TABS: 1.0000 mg | ORAL_TABLET | Freq: Two times a day (BID) | ORAL | 1 refills | Status: DC | PRN

## 2017-08-12 NOTE — Assessment & Plan Note (Signed)
labs

## 2017-08-12 NOTE — Assessment & Plan Note (Signed)
Here for medicare wellness/physical  Diet: heart healthy  Physical activity: not sedentary - dance lessons Depression/mood screen: negative  Hearing: intact to whispered voice  Visual acuity: grossly normal, performs annual eye exam  ADLs: capable  Fall risk: low to none  Home safety: good  Cognitive evaluation: intact to orientation, naming, recall and repetition  EOL planning: adv directives, full code/ I agree  I have personally reviewed and have noted  1. The patient's medical, surgical and social history  2. Their use of alcohol, tobacco or illicit drugs  3. Their current medications and supplements  4. The patient's functional ability including ADL's, fall risks, home safety risks and hearing or visual impairment.  5. Diet and physical activities  6. Evidence for depression or mood disorders - controlled 7. The roster of all physicians providing medical care to patient - is listed in the Snapshot section of the chart and reviewed today.    Today patient counseled on age appropriate routine health concerns for screening and prevention, each reviewed and up to date or declined. Immunizations reviewed and up to date or declined. Labs ordered and reviewed. Risk factors for depression reviewed and negative. Hearing function and visual acuity are intact. ADLs screened and addressed as needed. Functional ability and level of safety reviewed and appropriate. Education, counseling and referrals performed based on assessed risks today. Patient provided with a copy of personalized plan for preventive services.

## 2017-08-12 NOTE — Progress Notes (Signed)
Subjective:  Patient ID: Tiffany Gibbs, female    DOB: [birth year]  Age: 72 y.o. MRN: [id number]  CC: No chief complaint on file.   HPI SAIYA CRIST presents for wt gain, OA, anxiety f/u Well exam  Outpatient Medications Prior to Visit  Medication Sig Dispense Refill  . amoxicillin (AMOXIL) 500 MG capsule as needed.    [name] aspirin EC 81 MG tablet Take 81 mg by mouth daily.    . Cholecalciferol (VITAMIN D) 1000 UNITS capsule Take 1 capsule (1,000 Units total) by mouth daily. 100 capsule 3  . citalopram (CELEXA) 40 MG tablet Take 1 tablet (40 mg total) by mouth daily. 90 tablet 3  . clotrimazole-betamethasone (LOTRISONE) cream Apply 1 application topically 2 (two) times daily. (Patient taking differently: Apply 1 application topically as needed. ) 45 g 1  . Cyanocobalamin (B-12) 2500 MCG SUBL Place under the tongue.    . diphenhydrAMINE (BENADRYL) 25 MG tablet Take 25 mg by mouth at bedtime as needed.     . ezetimibe (ZETIA) 10 MG tablet Take 1 tablet (10 mg total) by mouth daily. 90 tablet 3  . Ferrous Sulfate (IRON) 325 (65 Fe) MG TABS Take 1 tablet by mouth 3 (three) times a week.    . fluticasone (FLONASE) 50 MCG/ACT nasal spray Place 1 spray into both nostrils daily as needed for allergies or rhinitis.    . furosemide (LASIX) 40 MG tablet Take 1 tablet (40 mg total) by mouth daily as needed. (Patient taking differently: Take 40 mg by mouth daily. ) 90 tablet 3  . gabapentin (NEURONTIN) 100 MG capsule Take 2 capsules (200 mg total) by mouth 3 (three) times daily. 180 capsule 6  . ibuprofen (ADVIL,MOTRIN) 600 MG tablet Take 1 tablet (600 mg total) by mouth 2 (two) times daily as needed. 60 tablet 3  . loratadine (CLARITIN) 10 MG tablet Take 10 mg by mouth daily.    [name] LORazepam (ATIVAN) 1 MG tablet Take 1 tablet (1 mg total) by mouth 2 (two) times daily as needed for anxiety. 60 tablet 3  . metFORMIN (GLUCOPHAGE) 500 MG tablet Take 1 tablet (500 mg total) by mouth 2 (two) times daily  with a meal. 180 tablet 3  . Omega-3 Fatty Acids (FISH OIL) 1200 MG CAPS Take 2,400 mg by mouth 2 (two) times daily.    . phentermine (ADIPEX-P) 37.5 MG tablet Take 1 tablet (37.5 mg total) by mouth daily before breakfast. 30 tablet 2  . Vitamin D, Ergocalciferol, (DRISDOL) 50000 units CAPS capsule Take 1 capsule (50,000 Units total) by mouth every 7 (seven) days. 12 capsule 0   Facility-Administered Medications Prior to Visit  Medication Dose Route Frequency Provider Last Rate Last Dose  . 0.9 %  sodium chloride infusion  500 mL Intravenous Continuous Pyrtle, Lajuan Lines, MD      . 0.9 %  sodium chloride infusion  500 mL Intravenous Continuous Pyrtle, Lajuan Lines, MD        ROS: Review of Systems  Constitutional: Negative for activity change, appetite change, chills, fatigue and unexpected weight change.  HENT: Negative for congestion, mouth sores and sinus pressure.   Eyes: Negative for visual disturbance.  Respiratory: Negative for cough and chest tightness.   Gastrointestinal: Negative for abdominal pain and nausea.  Genitourinary: Negative for difficulty urinating, frequency and vaginal pain.  Musculoskeletal: Negative for back pain and gait problem.  Skin: Negative for pallor and rash.  Neurological: Negative for dizziness, tremors, weakness, numbness and  headaches.  Psychiatric/Behavioral: Positive for decreased concentration. Negative for confusion and sleep disturbance. The patient is nervous/anxious.     Objective:  BP 102/62 (BP Location: Left Arm, Patient Position: Sitting, Cuff Size: Normal)   Pulse 66   Temp 97.8 F (36.6 C) (Oral)   Ht 5\' 4"  (1.626 m)   Wt 187 lb (84.8 kg)   SpO2 98%   BMI 32.10 kg/m   BP Readings from Last 3 Encounters:  08/12/17 102/62  06/29/17 128/70  06/04/17 103/60    Wt Readings from Last 3 Encounters:  08/12/17 187 lb (84.8 kg)  06/29/17 189 lb (85.7 kg)  06/04/17 190 lb 3.2 oz (86.3 kg)    Physical Exam  Constitutional: She appears  well-developed. No distress.  HENT:  Head: Normocephalic.  Right Ear: External ear normal.  Left Ear: External ear normal.  Nose: Nose normal.  Mouth/Throat: Oropharynx is clear and moist.  Eyes: Pupils are equal, round, and reactive to light. Conjunctivae are normal. Right eye exhibits no discharge. Left eye exhibits no discharge.  Neck: Normal range of motion. Neck supple. No JVD present. No tracheal deviation present. No thyromegaly present.  Cardiovascular: Normal rate, regular rhythm and normal heart sounds.  Pulmonary/Chest: No stridor. No respiratory distress. She has no wheezes.  Abdominal: Soft. Bowel sounds are normal. She exhibits no distension and no mass. There is no tenderness. There is no rebound and no guarding.  Musculoskeletal: She exhibits tenderness. She exhibits no edema.  Lymphadenopathy:    She has no cervical adenopathy.  Neurological: She displays normal reflexes. No cranial nerve deficit. She exhibits normal muscle tone. Coordination normal.  Skin: No rash noted. No erythema.  Psychiatric: She has a normal mood and affect. Her behavior is normal. Judgment and thought content normal.  contracture R palm Onycho B big toenails  Lab Results  Component Value Date   WBC 5.8 05/07/2017   HGB 14.2 05/07/2017   HCT 41.6 05/07/2017   PLT 224.0 05/07/2017   GLUCOSE 108 (H) 05/07/2017   CHOL 165 05/07/2017   TRIG 72.0 05/07/2017   HDL 56.50 05/07/2017   LDLDIRECT 148.2 02/03/2013   LDLCALC 94 05/07/2017   ALT 14 12/31/2016   AST 16 12/31/2016   NA 141 05/07/2017   K 3.7 05/07/2017   CL 101 05/07/2017   CREATININE 0.95 05/07/2017   BUN 20 05/07/2017   CO2 30 05/07/2017   TSH 1.83 04/01/2016   INR 1.14 11/21/2012   HGBA1C 5.7 05/07/2017   MICROALBUR 1.3 12/31/2016    US Abdominal Aorta Screening Aaa  Result Date: 12/23/2015 CLINICAL DATA:  Screening EXAM: ULTRASOUND OF ABDOMINAL AORTA TECHNIQUE: Ultrasound examination of the abdominal aorta was performed  to evaluate for abdominal aortic aneurysm. COMPARISON:  None. FINDINGS: Abdominal Aorta No aneurysm identified. Maximum Diameter: 1.7 cm proximally IMPRESSION: No evidence of abdominal aortic aneurysm. Electronically Signed   By: Rolm Baptise M.D.   On: 12/23/2015 10:06    Assessment & Plan:   There are no diagnoses linked to this encounter.   No orders of the defined types were placed in this encounter.    Follow-up: No follow-ups on file.  Walker Kehr, MD

## 2017-08-12 NOTE — Assessment & Plan Note (Signed)
B12 po

## 2017-08-12 NOTE — Assessment & Plan Note (Signed)
Gabapentin per Dr Tamala Julian

## 2017-08-12 NOTE — Assessment & Plan Note (Signed)
Metformin 

## 2017-08-12 NOTE — Assessment & Plan Note (Signed)
[place]

## 2017-08-12 NOTE — Patient Instructions (Signed)
Health Maintenance for Postmenopausal Women Menopause is a normal process in which your reproductive ability comes to an end. This process happens gradually over a span of months to years, usually between the ages of 22 and 9. Menopause is complete when you have missed 12 consecutive menstrual periods. It is important to talk with your health care provider about some of the most common conditions that affect postmenopausal women, such as heart disease, cancer, and bone loss (osteoporosis). Adopting a healthy lifestyle and getting preventive care can help to promote your health and wellness. Those actions can also lower your chances of developing some of these common conditions. What should I know about menopause? During menopause, you may experience a number of symptoms, such as:  Moderate-to-severe hot flashes.  Night sweats.  Decrease in sex drive.  Mood swings.  Headaches.  Tiredness.  Irritability.  Memory problems.  Insomnia.  Choosing to treat or not to treat menopausal changes is an individual decision that you make with your health care provider. What should I know about hormone replacement therapy and supplements? Hormone therapy products are effective for treating symptoms that are associated with menopause, such as hot flashes and night sweats. Hormone replacement carries certain risks, especially as you become older. If you are thinking about using estrogen or estrogen with progestin treatments, discuss the benefits and risks with your health care provider. What should I know about heart disease and stroke? Heart disease, heart attack, and stroke become more likely as you age. This may be due, in part, to the hormonal changes that your body experiences during menopause. These can affect how your body processes dietary fats, triglycerides, and cholesterol. Heart attack and stroke are both medical emergencies. There are many things that you can do to help prevent heart disease  and stroke:  Have your blood pressure checked at least every 1-2 years. High blood pressure causes heart disease and increases the risk of stroke.  If you are 53-22 years old, ask your health care provider if you should take aspirin to prevent a heart attack or a stroke.  Do not use any tobacco products, including cigarettes, chewing tobacco, or electronic cigarettes. If you need help quitting, ask your health care provider.  It is important to eat a healthy diet and maintain a healthy weight. ? Be sure to include plenty of vegetables, fruits, low-fat dairy products, and lean protein. ? Avoid eating foods that are high in solid fats, added sugars, or salt (sodium).  Get regular exercise. This is one of the most important things that you can do for your health. ? Try to exercise for at least 150 minutes each week. The type of exercise that you do should increase your heart rate and make you sweat. This is known as moderate-intensity exercise. ? Try to do strengthening exercises at least twice each week. Do these in addition to the moderate-intensity exercise.  Know your numbers.Ask your health care provider to check your cholesterol and your blood glucose. Continue to have your blood tested as directed by your health care provider.  What should I know about cancer screening? There are several types of cancer. Take the following steps to reduce your risk and to catch any cancer development as early as possible. Breast Cancer  Practice breast self-awareness. ? This means understanding how your breasts normally appear and feel. ? It also means doing regular breast self-exams. Let your health care provider know about any changes, no matter how small.  If you are 40  or older, have a clinician do a breast exam (clinical breast exam or CBE) every year. Depending on your age, family history, and medical history, it may be recommended that you also have a yearly breast X-ray (mammogram).  If you  have a family history of breast cancer, talk with your health care provider about genetic screening.  If you are at high risk for breast cancer, talk with your health care provider about having an MRI and a mammogram every year.  Breast cancer (BRCA) gene test is recommended for women who have family members with BRCA-related cancers. Results of the assessment will determine the need for genetic counseling and BRCA1 and for BRCA2 testing. BRCA-related cancers include these types: ? Breast. This occurs in males or females. ? Ovarian. ? Tubal. This may also be called fallopian tube cancer. ? Cancer of the abdominal or pelvic lining (peritoneal cancer). ? Prostate. ? Pancreatic.  Cervical, Uterine, and Ovarian Cancer Your health care provider may recommend that you be screened regularly for cancer of the pelvic organs. These include your ovaries, uterus, and vagina. This screening involves a pelvic exam, which includes checking for microscopic changes to the surface of your cervix (Pap test).  For women ages 21-65, health care providers may recommend a pelvic exam and a Pap test every three years. For women ages 79-65, they may recommend the Pap test and pelvic exam, combined with testing for human papilloma virus (HPV), every five years. Some types of HPV increase your risk of cervical cancer. Testing for HPV may also be done on women of any age who have unclear Pap test results.  Other health care providers may not recommend any screening for nonpregnant women who are considered low risk for pelvic cancer and have no symptoms. Ask your health care provider if a screening pelvic exam is right for you.  If you have had past treatment for cervical cancer or a condition that could lead to cancer, you need Pap tests and screening for cancer for at least 20 years after your treatment. If Pap tests have been discontinued for you, your risk factors (such as having a new sexual partner) need to be  reassessed to determine if you should start having screenings again. Some women have medical problems that increase the chance of getting cervical cancer. In these cases, your health care provider may recommend that you have screening and Pap tests more often.  If you have a family history of uterine cancer or ovarian cancer, talk with your health care provider about genetic screening.  If you have vaginal bleeding after reaching menopause, tell your health care provider.  There are currently no reliable tests available to screen for ovarian cancer.  Lung Cancer Lung cancer screening is recommended for adults 69-62 years old who are at high risk for lung cancer because of a history of smoking. A yearly low-dose CT scan of the lungs is recommended if you:  Currently smoke.  Have a history of at least 30 pack-years of smoking and you currently smoke or have quit within the past 15 years. A pack-year is smoking an average of one pack of cigarettes per day for one year.  Yearly screening should:  Continue until it has been 15 years since you quit.  Stop if you develop a health problem that would prevent you from having lung cancer treatment.  Colorectal Cancer  This type of cancer can be detected and can often be prevented.  Routine colorectal cancer screening usually begins at  age 42 and continues through age 45.  If you have risk factors for colon cancer, your health care provider may recommend that you be screened at an earlier age.  If you have a family history of colorectal cancer, talk with your health care provider about genetic screening.  Your health care provider may also recommend using home test kits to check for hidden blood in your stool.  A small camera at the end of a tube can be used to examine your colon directly (sigmoidoscopy or colonoscopy). This is done to check for the earliest forms of colorectal cancer.  Direct examination of the colon should be repeated every  5-10 years until age 71. However, if early forms of precancerous polyps or small growths are found or if you have a family history or genetic risk for colorectal cancer, you may need to be screened more often.  Skin Cancer  Check your skin from head to toe regularly.  Monitor any moles. Be sure to tell your health care provider: ? About any new moles or changes in moles, especially if there is a change in a mole's shape or color. ? If you have a mole that is larger than the size of a pencil eraser.  If any of your family members has a history of skin cancer, especially at a young age, talk with your health care provider about genetic screening.  Always use sunscreen. Apply sunscreen liberally and repeatedly throughout the day.  Whenever you are outside, protect yourself by wearing long sleeves, pants, a wide-brimmed hat, and sunglasses.  What should I know about osteoporosis? Osteoporosis is a condition in which bone destruction happens more quickly than new bone creation. After menopause, you may be at an increased risk for osteoporosis. To help prevent osteoporosis or the bone fractures that can happen because of osteoporosis, the following is recommended:  If you are 46-71 years old, get at least 1,000 mg of calcium and at least 600 mg of vitamin D per day.  If you are older than age 55 but younger than age 65, get at least 1,200 mg of calcium and at least 600 mg of vitamin D per day.  If you are older than age 54, get at least 1,200 mg of calcium and at least 800 mg of vitamin D per day.  Smoking and excessive alcohol intake increase the risk of osteoporosis. Eat foods that are rich in calcium and vitamin D, and do weight-bearing exercises several times each week as directed by your health care provider. What should I know about how menopause affects my mental health? Depression may occur at any age, but it is more common as you become older. Common symptoms of depression  include:  Low or sad mood.  Changes in sleep patterns.  Changes in appetite or eating patterns.  Feeling an overall lack of motivation or enjoyment of activities that you previously enjoyed.  Frequent crying spells.  Talk with your health care provider if you think that you are experiencing depression. What should I know about immunizations? It is important that you get and maintain your immunizations. These include:  Tetanus, diphtheria, and pertussis (Tdap) booster vaccine.  Influenza every year before the flu season begins.  Pneumonia vaccine.  Shingles vaccine.  Your health care provider may also recommend other immunizations. This information is not intended to replace advice given to you by your health care provider. Make sure you discuss any questions you have with your health care provider. Document Released: 02/20/2005  Document Revised: 07/19/2015 Document Reviewed: 10/02/2014 Elsevier Interactive Patient Education  2018 Elsevier Inc.  

## 2017-08-12 NOTE — Assessment & Plan Note (Signed)
Lorazepam ° °

## 2017-08-12 NOTE — Assessment & Plan Note (Signed)
Spironaolactone Furosemide prn

## 2017-08-12 NOTE — Assessment & Plan Note (Addendum)
MCD Discussed meds and meds side effects

## 2017-08-12 NOTE — Assessment & Plan Note (Signed)
F/u w/sleep med

## 2017-08-13 ENCOUNTER — Other Ambulatory Visit: Payer: Self-pay | Admitting: Internal Medicine

## 2017-08-13 MED ORDER — POTASSIUM CHLORIDE ER 8 MEQ PO TBCR: 8.0000 meq | EXTENDED_RELEASE_TABLET | Freq: Every day | ORAL | 3 refills | Status: DC

## 2017-08-15 ENCOUNTER — Other Ambulatory Visit: Payer: Self-pay | Admitting: Internal Medicine

## 2017-08-15 DIAGNOSIS — J4520 Mild intermittent asthma, uncomplicated: Secondary | ICD-10-CM

## 2017-08-15 DIAGNOSIS — E538 Deficiency of other specified B group vitamins: Secondary | ICD-10-CM

## 2017-08-15 DIAGNOSIS — I8002 Phlebitis and thrombophlebitis of superficial vessels of left lower extremity: Secondary | ICD-10-CM

## 2017-08-15 DIAGNOSIS — Z0000 Encounter for general adult medical examination without abnormal findings: Secondary | ICD-10-CM

## 2017-08-15 DIAGNOSIS — R413 Other amnesia: Secondary | ICD-10-CM

## 2017-08-15 DIAGNOSIS — E119 Type 2 diabetes mellitus without complications: Secondary | ICD-10-CM

## 2017-08-15 DIAGNOSIS — F411 Generalized anxiety disorder: Secondary | ICD-10-CM

## 2017-08-15 DIAGNOSIS — I10 Essential (primary) hypertension: Secondary | ICD-10-CM

## 2017-08-17 DIAGNOSIS — R69 Illness, unspecified: Secondary | ICD-10-CM | POA: Diagnosis not present

## 2017-08-20 ENCOUNTER — Telehealth: Payer: Self-pay | Admitting: Internal Medicine

## 2017-08-20 NOTE — Telephone Encounter (Signed)
Copied from [place] [id number]. Topic: Quick Communication - See Telephone Encounter >> Aug 20, 2017  3:47 PM Burchel, Abbi R wrote: CRM for notification. See Telephone encounter for: 08/20/17.  Pt requesting a call back to discuss kidney function that has decreased since 12/2016.    Pt: [phone]

## 2017-08-23 NOTE — Telephone Encounter (Signed)
Pt informed of below.  

## 2017-08-23 NOTE — Telephone Encounter (Signed)
I called pt- She is concerned about her low GFR of 54.14. She wants to know if any of her current meds could be effecting this or is there anything she needs to do for this. Please advise.

## 2017-08-23 NOTE — Telephone Encounter (Signed)
Yes. Furosemide and Ibuprofen can. D/c Ibuprofen. Keep ROV Thx

## 2017-09-02 DIAGNOSIS — G4733 Obstructive sleep apnea (adult) (pediatric): Secondary | ICD-10-CM | POA: Diagnosis not present

## 2017-09-22 DIAGNOSIS — R69 Illness, unspecified: Secondary | ICD-10-CM | POA: Diagnosis not present

## 2017-10-01 DIAGNOSIS — G4733 Obstructive sleep apnea (adult) (pediatric): Secondary | ICD-10-CM | POA: Diagnosis not present

## 2017-10-04 DIAGNOSIS — G4733 Obstructive sleep apnea (adult) (pediatric): Secondary | ICD-10-CM | POA: Diagnosis not present

## 2017-10-13 ENCOUNTER — Ambulatory Visit (INDEPENDENT_AMBULATORY_CARE_PROVIDER_SITE_OTHER): Payer: Medicare HMO

## 2017-10-13 ENCOUNTER — Ambulatory Visit: Payer: Medicare HMO

## 2017-10-13 DIAGNOSIS — Z23 Encounter for immunization: Secondary | ICD-10-CM | POA: Diagnosis not present

## 2017-10-13 DIAGNOSIS — Z1231 Encounter for screening mammogram for malignant neoplasm of breast: Secondary | ICD-10-CM | POA: Diagnosis not present

## 2017-10-13 LAB — HM MAMMOGRAPHY

## 2017-10-22 ENCOUNTER — Encounter: Payer: Self-pay | Admitting: Internal Medicine

## 2017-10-25 ENCOUNTER — Telehealth: Payer: Self-pay | Admitting: Internal Medicine

## 2017-10-26 NOTE — Telephone Encounter (Signed)
This was last prescribed in January of 2019.  It was not mentioned in her last visit with Dr Alain Marion - should wait until his return for his approval.

## 2017-10-26 NOTE — Telephone Encounter (Signed)
Please advise about refill in Dr. Plotnikovs absence. 

## 2017-10-28 ENCOUNTER — Ambulatory Visit: Payer: Medicare HMO | Admitting: Family

## 2017-10-28 NOTE — Telephone Encounter (Signed)
Patient called in about phentermine to get her through her camping trip this weekend I advised her the script was expired and she will have to see Dr Alain Marion to get a new script due to not being discussed at her last appt. Please advise patient  CB# [phone]

## 2017-10-31 DIAGNOSIS — N8111 Cystocele, midline: Secondary | ICD-10-CM | POA: Diagnosis not present

## 2017-11-01 NOTE — Telephone Encounter (Signed)
Please advise about RX.  RX was denied while you were on vacation since it was not discussed at last OV and has not been prescribed since January.

## 2017-11-01 NOTE — Telephone Encounter (Signed)
Patient is calling to request a call back in regards to her medication Phentermine. Advise her this medication is expired the patient stated she understands but she is hoping to have it filled and sent to the pharmacy sent below. She is  Out of town traveling. Please advise    Preferred pharmacy:  [address] [place], [place] [address]. Get Directions. Phone: [phone]. Phone: [phone] .Marland KitchenMarland Kitchen

## 2017-11-02 MED ORDER — PHENTERMINE HCL 37.5 MG PO TABS: 37.5000 mg | ORAL_TABLET | Freq: Every day | ORAL | 2 refills | Status: DC

## 2017-11-02 NOTE — Telephone Encounter (Signed)
RX faxed

## 2017-11-02 NOTE — Addendum Note (Signed)
Addended by: Karren Cobble on: 11/02/2017 04:12 PM   Modules accepted: Orders

## 2017-11-02 NOTE — Telephone Encounter (Signed)
Pt calling to check status. She would like medication sent to [place], [place] [address]. Get Directions. Phone: [phone]. Phone: [phone] ... Please advise

## 2017-11-02 NOTE — Telephone Encounter (Addendum)
Ok to ref x 1 month w/2 ref Corrin Parker

## 2017-11-02 NOTE — Telephone Encounter (Signed)
FQ#[phone]

## 2017-11-04 ENCOUNTER — Telehealth: Payer: Self-pay | Admitting: Internal Medicine

## 2017-11-04 DIAGNOSIS — G4733 Obstructive sleep apnea (adult) (pediatric): Secondary | ICD-10-CM | POA: Diagnosis not present

## 2017-11-04 NOTE — Telephone Encounter (Signed)
FYI

## 2017-11-04 NOTE — Telephone Encounter (Signed)
Copied from [place] [id number]. Topic: Compliment - Provider (non-sensitive) >> Nov 04, 2017  1:04 PM Jarold Motto, Fraser Din wrote: Date of encounter: 11/04/17 Details of compliment:would like to tank dr plotnikov for getting medication to pharmacy while she was out of town  Who would the patient like to thank/see rewarded? No  On a scale of 1-10, how was your experience? 10  Route to Engineer, building services.

## 2017-11-15 ENCOUNTER — Ambulatory Visit: Payer: Medicare HMO | Admitting: Internal Medicine

## 2017-12-02 ENCOUNTER — Ambulatory Visit: Payer: Self-pay | Admitting: *Deleted

## 2017-12-02 NOTE — Telephone Encounter (Signed)
Returned call to patient and again left message that she has had high dose flu vaccine this year.

## 2017-12-02 NOTE — Telephone Encounter (Signed)
Message from Ivar Drape sent at 12/02/2017 2:53 PM EST   Patient received a flu shot but she does not know if it was the high dose one or not. If it wasn't the high dose one she would like to have the high one dose as well. Please provide.   Left message on voice mail- answer to question about vaccine is yes- patient may call back if she has any questions and speak to any triage nurse.

## 2017-12-06 ENCOUNTER — Ambulatory Visit: Payer: Medicare HMO | Admitting: Internal Medicine

## 2017-12-06 DIAGNOSIS — G4733 Obstructive sleep apnea (adult) (pediatric): Secondary | ICD-10-CM | POA: Diagnosis not present

## 2017-12-07 ENCOUNTER — Ambulatory Visit: Payer: Medicare HMO | Admitting: Internal Medicine

## 2017-12-23 ENCOUNTER — Telehealth: Payer: Self-pay | Admitting: Internal Medicine

## 2017-12-23 ENCOUNTER — Other Ambulatory Visit (INDEPENDENT_AMBULATORY_CARE_PROVIDER_SITE_OTHER): Payer: Medicare HMO

## 2017-12-23 DIAGNOSIS — Z0000 Encounter for general adult medical examination without abnormal findings: Secondary | ICD-10-CM

## 2017-12-23 DIAGNOSIS — D582 Other hemoglobinopathies: Secondary | ICD-10-CM

## 2017-12-23 DIAGNOSIS — E785 Hyperlipidemia, unspecified: Secondary | ICD-10-CM | POA: Diagnosis not present

## 2017-12-23 DIAGNOSIS — E119 Type 2 diabetes mellitus without complications: Secondary | ICD-10-CM

## 2017-12-23 LAB — URINALYSIS, ROUTINE W REFLEX MICROSCOPIC
Bilirubin Urine: NEGATIVE
Ketones, ur: NEGATIVE
Nitrite: NEGATIVE
SPECIFIC GRAVITY, URINE: 1.01 (ref 1.000–1.030)
Total Protein, Urine: NEGATIVE
URINE GLUCOSE: NEGATIVE
Urobilinogen, UA: 0.2 (ref 0.0–1.0)
pH: 5.5 (ref 5.0–8.0)

## 2017-12-23 LAB — LIPID PANEL
Cholesterol: 163 mg/dL (ref 0–200)
HDL: 52.7 mg/dL (ref >39.00)
LDL CALC: 95 mg/dL (ref 0–99)
NonHDL: 110.59
TRIGLYCERIDES: 80 mg/dL (ref 0.0–149.0)
Total CHOL/HDL Ratio: 3
VLDL: 16 mg/dL (ref 0.0–40.0)

## 2017-12-23 LAB — BASIC METABOLIC PANEL
BUN: 22 mg/dL (ref 6–23)
CALCIUM: 9.8 mg/dL (ref 8.4–10.5)
CO2: 30 mEq/L (ref 19–32)
CREATININE: 1.01 mg/dL (ref 0.40–1.20)
Chloride: 99 mEq/L (ref 96–112)
GFR: 57.19 mL/min — ABNORMAL LOW (ref >60.00)
Glucose, Bld: 97 mg/dL (ref 70–99)
POTASSIUM: 3 meq/L — AB (ref 3.5–5.1)
Sodium: 138 mEq/L (ref 135–145)

## 2017-12-23 LAB — CBC WITH DIFFERENTIAL/PLATELET
Basophils Absolute: 0.1 10*3/uL (ref 0.0–0.1)
Basophils Relative: 1.1 % (ref 0.0–3.0)
EOS PCT: 2.3 % (ref 0.0–5.0)
Eosinophils Absolute: 0.1 10*3/uL (ref 0.0–0.7)
HEMATOCRIT: 38.5 % (ref 36.0–46.0)
HEMOGLOBIN: 13.4 g/dL (ref 12.0–15.0)
Lymphocytes Relative: 37.7 % (ref 12.0–46.0)
Lymphs Abs: 2.1 10*3/uL (ref 0.7–4.0)
MCHC: 34.8 g/dL (ref 30.0–36.0)
MCV: 89.8 fl (ref 78.0–100.0)
Monocytes Absolute: 0.5 10*3/uL (ref 0.1–1.0)
Monocytes Relative: 9 % (ref 3.0–12.0)
Neutro Abs: 2.8 10*3/uL (ref 1.4–7.7)
Neutrophils Relative %: 49.9 % (ref 43.0–77.0)
PLATELETS: 253 10*3/uL (ref 150.0–400.0)
RBC: 4.28 Mil/uL (ref 3.87–5.11)
RDW: 12.8 % (ref 11.5–15.5)
WBC: 5.6 10*3/uL (ref 4.0–10.5)

## 2017-12-23 LAB — HEPATIC FUNCTION PANEL
ALBUMIN: 4.3 g/dL (ref 3.5–5.2)
ALT: 10 U/L (ref 0–35)
AST: 13 U/L (ref 0–37)
Alkaline Phosphatase: 35 U/L — ABNORMAL LOW (ref 39–117)
BILIRUBIN DIRECT: 0.1 mg/dL (ref 0.0–0.3)
BILIRUBIN TOTAL: 0.7 mg/dL (ref 0.2–1.2)
TOTAL PROTEIN: 6.8 g/dL (ref 6.0–8.3)

## 2017-12-23 LAB — MICROALBUMIN / CREATININE URINE RATIO
CREATININE, U: 127.4 mg/dL
MICROALB UR: 0.9 mg/dL (ref 0.0–1.9)
Microalb Creat Ratio: 0.7 mg/g (ref 0.0–30.0)

## 2017-12-23 LAB — TSH: TSH: 1.11 u[IU]/mL (ref 0.35–4.50)

## 2017-12-23 NOTE — Telephone Encounter (Signed)
Patient was here today to get her labs done.  She would like a copy of the results mailed to her when they are completed so that she will have them for her visit.

## 2017-12-24 NOTE — Telephone Encounter (Signed)
Labs mailed

## 2017-12-29 ENCOUNTER — Telehealth: Payer: Self-pay | Admitting: Internal Medicine

## 2017-12-29 NOTE — Telephone Encounter (Signed)
Copied from [place] #[id number]. Topic: Quick Communication - Rx Refill/Question >> Dec 29, 2017  6:04 PM Waylan Rocher, [place] L wrote: Medication: potassium chloride ER (KLOR-CON) 8 MEQ tablet (says Dr. Alain Marion wants her to double up on her medication so she needs 180 pills because it's a 3 month supply, says it's urgent and would like this by Friday the 20th)  Has the patient contacted their pharmacy? Yes.   (Agent: If no, request that the patient contact the pharmacy for the refill.) (Agent: If yes, when and what did the pharmacy advise?) changed pharmacy  Preferred Pharmacy (with phone number or street name): [facility] [address], [place] - [place] [address] [place] [address] Phone: [phone] Fax: [phone]  Agent: Please be advised that RX refills may take up to 3 business days. We ask that you follow-up with your pharmacy.

## 2017-12-30 MED ORDER — POTASSIUM CHLORIDE ER 8 MEQ PO TBCR: 8.0000 meq | EXTENDED_RELEASE_TABLET | Freq: Two times a day (BID) | ORAL | 3 refills | Status: DC

## 2017-12-30 NOTE — Telephone Encounter (Signed)
RX sent

## 2017-12-31 ENCOUNTER — Telehealth: Payer: Self-pay

## 2017-12-31 DIAGNOSIS — E119 Type 2 diabetes mellitus without complications: Secondary | ICD-10-CM

## 2017-12-31 NOTE — Telephone Encounter (Signed)
LM notifying lab ordered   Copied from [place] [id number]. Topic: General - Other >> Dec 30, 2017  3:27 PM Rayann Heman wrote: Reason for CRM: pt called and stated that she did not get her a1c done on 12/23/17 and would like to get it done. Please advise

## 2018-01-03 ENCOUNTER — Telehealth: Payer: Self-pay

## 2018-01-03 NOTE — Telephone Encounter (Signed)
FYI  Copied from [place] [id number]. Topic: General - Other >> Jan 03, 2018 12:28 PM Percell Belt A wrote: CRM for notification. See Telephone encounter for: 01/03/18.  Pt called in and is concerned about  kidney functions.  She read recent labs and looked up all the result on the Internet and is concerned about the results.  She has an appt tomorrow with PCP, she asked that I send a fyi note to him to let him know she is concerned about this and this is what is wants to talk about.  She stated she didn't sleep worrying about this results.  Just fyi

## 2018-01-04 ENCOUNTER — Ambulatory Visit (INDEPENDENT_AMBULATORY_CARE_PROVIDER_SITE_OTHER): Payer: Medicare HMO | Admitting: Internal Medicine

## 2018-01-04 ENCOUNTER — Encounter: Payer: Self-pay | Admitting: Internal Medicine

## 2018-01-04 DIAGNOSIS — E6609 Other obesity due to excess calories: Secondary | ICD-10-CM

## 2018-01-04 DIAGNOSIS — E538 Deficiency of other specified B group vitamins: Secondary | ICD-10-CM | POA: Diagnosis not present

## 2018-01-04 DIAGNOSIS — D582 Other hemoglobinopathies: Secondary | ICD-10-CM | POA: Diagnosis not present

## 2018-01-04 DIAGNOSIS — Z6834 Body mass index (BMI) 34.0-34.9, adult: Secondary | ICD-10-CM

## 2018-01-04 DIAGNOSIS — E785 Hyperlipidemia, unspecified: Secondary | ICD-10-CM | POA: Diagnosis not present

## 2018-01-04 DIAGNOSIS — I10 Essential (primary) hypertension: Secondary | ICD-10-CM

## 2018-01-04 DIAGNOSIS — R7989 Other specified abnormal findings of blood chemistry: Secondary | ICD-10-CM

## 2018-01-04 DIAGNOSIS — R945 Abnormal results of liver function studies: Secondary | ICD-10-CM | POA: Diagnosis not present

## 2018-01-04 MED ORDER — PHENTERMINE HCL 37.5 MG PO TABS: 37.5000 mg | ORAL_TABLET | Freq: Every day | ORAL | 2 refills | Status: DC

## 2018-01-04 MED ORDER — FUROSEMIDE 40 MG PO TABS: 20.0000 mg | ORAL_TABLET | Freq: Every day | ORAL | 3 refills | Status: DC | PRN

## 2018-01-04 NOTE — Patient Instructions (Signed)

## 2018-01-04 NOTE — Assessment & Plan Note (Signed)
CT ca scoring test offered

## 2018-01-04 NOTE — Progress Notes (Signed)
Subjective:  Patient ID: Tiffany Gibbs, female    DOB: Sep 11, 1945  Age: 72 y.o. MRN: [id number]  CC: No chief complaint on file.   HPI YUKIE BERGERON presents for CRI, HTN, low K f/u. Pt lost wt on Phentermine  Outpatient Medications Prior to Visit  Medication Sig Dispense Refill  . amoxicillin (AMOXIL) 500 MG capsule as needed.    [name] aspirin EC 81 MG tablet Take 81 mg by mouth daily.    . ciclopirox (PENLAC) 8 % solution APPLY OVER NAIL AND SURROUNDING SKIN DAILY AT BEDTIME, APPLY DAILY OVER PREVIOUS COAT, AFTER 7 DAYS MAY REMOVE WITH ALCHOL AND CONTINUE CYCL 6.6 mL 0  . citalopram (CELEXA) 40 MG tablet TAKE 1 TABLET DAILY 90 tablet 1  . clotrimazole-betamethasone (LOTRISONE) cream Apply 1 application topically 2 (two) times daily. (Patient taking differently: Apply 1 application topically as needed. ) 45 g 1  . Cyanocobalamin (B-12) 2500 MCG SUBL Place under the tongue.    . diphenhydrAMINE (BENADRYL) 25 MG tablet Take 25 mg by mouth at bedtime as needed.     . ezetimibe (ZETIA) 10 MG tablet Take 1 tablet (10 mg total) by mouth daily. 90 tablet 3  . Ferrous Sulfate (IRON) 325 (65 Fe) MG TABS Take 1 tablet by mouth 3 (three) times a week.    . fluticasone (FLONASE) 50 MCG/ACT nasal spray Place 1 spray into both nostrils daily as needed for allergies or rhinitis.    . furosemide (LASIX) 40 MG tablet Take 1 tablet (40 mg total) by mouth daily as needed. (Patient taking differently: Take 40 mg by mouth daily. ) 90 tablet 3  . gabapentin (NEURONTIN) 100 MG capsule Take 2 capsules (200 mg total) by mouth 3 (three) times daily. 180 capsule 6  . ibuprofen (ADVIL,MOTRIN) 600 MG tablet Take 1 tablet (600 mg total) by mouth 2 (two) times daily as needed. 60 tablet 3  . loratadine (CLARITIN) 10 MG tablet Take 10 mg by mouth daily.    [name] LORazepam (ATIVAN) 1 MG tablet Take 1 tablet (1 mg total) by mouth 2 (two) times daily as needed for anxiety. 180 tablet 1  . metFORMIN (GLUCOPHAGE) 500 MG tablet  Take 1 tablet (500 mg total) by mouth 2 (two) times daily with a meal. 180 tablet 3  . Omega-3 Fatty Acids (FISH OIL) 1200 MG CAPS Take 2,400 mg by mouth 2 (two) times daily.    . potassium chloride (KLOR-CON) 8 MEQ tablet Take 1 tablet (8 mEq total) by mouth 2 (two) times daily. 180 tablet 3  . Vitamin D, Ergocalciferol, (DRISDOL) 50000 units CAPS capsule Take 1 capsule (50,000 Units total) by mouth every 30 (thirty) days. 3 capsule 4  . phentermine (ADIPEX-P) 37.5 MG tablet Take 1 tablet (37.5 mg total) by mouth daily before breakfast. 30 tablet 2   Facility-Administered Medications Prior to Visit  Medication Dose Route Frequency Provider Last Rate Last Dose  . 0.9 %  sodium chloride infusion  500 mL Intravenous Continuous Pyrtle, Lajuan Lines, MD      . 0.9 %  sodium chloride infusion  500 mL Intravenous Continuous Pyrtle, Lajuan Lines, MD        ROS: Review of Systems  Constitutional: Negative for activity change, appetite change, chills, fatigue and unexpected weight change.  HENT: Negative for congestion, mouth sores and sinus pressure.   Eyes: Negative for visual disturbance.  Respiratory: Negative for cough and chest tightness.   Gastrointestinal: Negative for abdominal pain and nausea.  Genitourinary: Negative for difficulty urinating, frequency and vaginal pain.  Musculoskeletal: Negative for back pain and gait problem.  Skin: Negative for pallor and rash.  Neurological: Negative for dizziness, tremors, weakness, numbness and headaches.  Psychiatric/Behavioral: Negative for confusion, sleep disturbance and suicidal ideas. The patient is nervous/anxious.     Objective:  Ht 5\' 4"  (1.626 m)   Wt 160 lb (72.6 kg)   BMI 27.46 kg/m   BP Readings from Last 3 Encounters:  08/12/17 102/62  06/29/17 128/70  06/04/17 103/60    Wt Readings from Last 3 Encounters:  01/04/18 160 lb (72.6 kg)  08/12/17 187 lb (84.8 kg)  06/29/17 189 lb (85.7 kg)    Physical Exam Constitutional:       General: She is not in acute distress.    Appearance: She is well-developed.  HENT:     Head: Normocephalic.     Right Ear: External ear normal.     Left Ear: External ear normal.     Nose: Nose normal.  Eyes:     General:        Right eye: No discharge.        Left eye: No discharge.     Conjunctiva/sclera: Conjunctivae normal.     Pupils: Pupils are equal, round, and reactive to light.  Neck:     Musculoskeletal: Normal range of motion and neck supple.     Thyroid: No thyromegaly.     Vascular: No JVD.     Trachea: No tracheal deviation.  Cardiovascular:     Rate and Rhythm: Normal rate and regular rhythm.     Heart sounds: Normal heart sounds.  Pulmonary:     Effort: No respiratory distress.     Breath sounds: No stridor. No wheezing.  Abdominal:     General: Bowel sounds are normal. There is no distension.     Palpations: Abdomen is soft. There is no mass.     Tenderness: There is no abdominal tenderness. There is no guarding or rebound.  Musculoskeletal:        General: No tenderness.  Lymphadenopathy:     Cervical: No cervical adenopathy.  Skin:    Findings: No erythema or rash.  Neurological:     Cranial Nerves: No cranial nerve deficit.     Motor: No abnormal muscle tone.     Coordination: Coordination normal.     Deep Tendon Reflexes: Reflexes normal.  Psychiatric:        Behavior: Behavior normal.        Thought Content: Thought content normal.        Judgment: Judgment normal.     Lab Results  Component Value Date   WBC 5.6 12/23/2017   HGB 13.4 12/23/2017   HCT 38.5 12/23/2017   PLT 253.0 12/23/2017   GLUCOSE 97 12/23/2017   CHOL 163 12/23/2017   TRIG 80.0 12/23/2017   HDL 52.70 12/23/2017   LDLDIRECT 148.2 02/03/2013   LDLCALC 95 12/23/2017   ALT 10 12/23/2017   AST 13 12/23/2017   NA 138 12/23/2017   K 3.0 (L) 12/23/2017   CL 99 12/23/2017   CREATININE 1.01 12/23/2017   BUN 22 12/23/2017   CO2 30 12/23/2017   TSH 1.11 12/23/2017   INR  1.14 11/21/2012   HGBA1C 5.7 08/12/2017   MICROALBUR 0.9 12/23/2017    US Abdominal Aorta Screening Aaa  Result Date: 12/23/2015 CLINICAL DATA:  Screening EXAM: ULTRASOUND OF ABDOMINAL AORTA TECHNIQUE: Ultrasound examination of the abdominal aorta was  performed to evaluate for abdominal aortic aneurysm. COMPARISON:  None. FINDINGS: Abdominal Aorta No aneurysm identified. Maximum Diameter: 1.7 cm proximally IMPRESSION: No evidence of abdominal aortic aneurysm. Electronically Signed   By: Rolm Baptise M.D.   On: 12/23/2015 10:06    Assessment & Plan:   There are no diagnoses linked to this encounter.   No orders of the defined types were placed in this encounter.    Follow-up: No follow-ups on file.  Walker Kehr, MD

## 2018-01-04 NOTE — Assessment & Plan Note (Signed)
On B12 

## 2018-01-04 NOTE — Assessment & Plan Note (Signed)
Pt lost 27 lbs on Phentermine Monitor LFTs

## 2018-01-04 NOTE — Assessment & Plan Note (Signed)
CBC

## 2018-01-04 NOTE — Assessment & Plan Note (Signed)
BP Readings from Last 3 Encounters:  01/04/18 112/64  08/12/17 102/62  06/29/17 128/70  pt lost wt

## 2018-01-04 NOTE — Assessment & Plan Note (Signed)
Pt lost 27 lbs on Phentermine

## 2018-01-04 NOTE — Telephone Encounter (Signed)
Noted. Thx.

## 2018-01-07 DIAGNOSIS — G4733 Obstructive sleep apnea (adult) (pediatric): Secondary | ICD-10-CM | POA: Diagnosis not present

## 2018-01-27 ENCOUNTER — Ambulatory Visit (INDEPENDENT_AMBULATORY_CARE_PROVIDER_SITE_OTHER)
Admission: RE | Admit: 2018-01-27 | Discharge: 2018-01-27 | Disposition: A | Discharge status: Home / Self Care | Payer: Medicare HMO | Source: Ambulatory Visit | Attending: Internal Medicine | Admitting: Internal Medicine

## 2018-01-27 DIAGNOSIS — E785 Hyperlipidemia, unspecified: Secondary | ICD-10-CM

## 2018-02-03 ENCOUNTER — Other Ambulatory Visit (INDEPENDENT_AMBULATORY_CARE_PROVIDER_SITE_OTHER): Payer: Medicare HMO

## 2018-02-03 ENCOUNTER — Ambulatory Visit (INDEPENDENT_AMBULATORY_CARE_PROVIDER_SITE_OTHER): Payer: Medicare HMO | Admitting: Internal Medicine

## 2018-02-03 ENCOUNTER — Encounter: Payer: Self-pay | Admitting: Internal Medicine

## 2018-02-03 VITALS — BP 126/74 | HR 71 | Temp 98.1°F | Ht 64.0 in | Wt 157.0 lb

## 2018-02-03 DIAGNOSIS — E119 Type 2 diabetes mellitus without complications: Secondary | ICD-10-CM

## 2018-02-03 DIAGNOSIS — R69 Illness, unspecified: Secondary | ICD-10-CM | POA: Diagnosis not present

## 2018-02-03 DIAGNOSIS — E785 Hyperlipidemia, unspecified: Secondary | ICD-10-CM

## 2018-02-03 DIAGNOSIS — Z0000 Encounter for general adult medical examination without abnormal findings: Secondary | ICD-10-CM | POA: Diagnosis not present

## 2018-02-03 DIAGNOSIS — F419 Anxiety disorder, unspecified: Secondary | ICD-10-CM

## 2018-02-03 DIAGNOSIS — I10 Essential (primary) hypertension: Secondary | ICD-10-CM

## 2018-02-03 LAB — BASIC METABOLIC PANEL
BUN: 16 mg/dL (ref 6–23)
CO2: 29 mEq/L (ref 19–32)
Calcium: 10 mg/dL (ref 8.4–10.5)
Chloride: 102 mEq/L (ref 96–112)
Creatinine, Ser: 0.92 mg/dL (ref 0.40–1.20)
GFR: 59.91 mL/min — AB (ref >60.00)
Glucose, Bld: 78 mg/dL (ref 70–99)
Potassium: 3.5 mEq/L (ref 3.5–5.1)
Sodium: 140 mEq/L (ref 135–145)

## 2018-02-03 LAB — HEMOGLOBIN A1C: HEMOGLOBIN A1C: 5.3 % (ref 4.6–6.5)

## 2018-02-03 NOTE — Assessment & Plan Note (Addendum)
CT ca score is 0 1/20 Zetia

## 2018-02-03 NOTE — Assessment & Plan Note (Signed)
On Metformin 

## 2018-02-03 NOTE — Assessment & Plan Note (Signed)
Weighted blanket Celexa

## 2018-02-03 NOTE — Assessment & Plan Note (Signed)
Furosemide prn 

## 2018-02-03 NOTE — Patient Instructions (Signed)
Weighted blanket 

## 2018-02-03 NOTE — Progress Notes (Signed)
Subjective:  Patient ID: Tiffany Gibbs, female    DOB: January 31, 1945  Age: 73 y.o. MRN: [id number]  CC: No chief complaint on file.   HPI JILLANE PO presents for  Dyslipidemia, B12 def, hyperkalemia f/u  Outpatient Medications Prior to Visit  Medication Sig Dispense Refill  . amoxicillin (AMOXIL) 500 MG capsule as needed.    [name] aspirin EC 81 MG tablet Take 81 mg by mouth daily.    . ciclopirox (PENLAC) 8 % solution APPLY OVER NAIL AND SURROUNDING SKIN DAILY AT BEDTIME, APPLY DAILY OVER PREVIOUS COAT, AFTER 7 DAYS MAY REMOVE WITH ALCHOL AND CONTINUE CYCL 6.6 mL 0  . citalopram (CELEXA) 40 MG tablet TAKE 1 TABLET DAILY 90 tablet 1  . clotrimazole-betamethasone (LOTRISONE) cream Apply 1 application topically 2 (two) times daily. (Patient taking differently: Apply 1 application topically as needed. ) 45 g 1  . Cyanocobalamin (B-12) 2500 MCG SUBL Place under the tongue.    . diphenhydrAMINE (BENADRYL) 25 MG tablet Take 25 mg by mouth at bedtime as needed.     . ezetimibe (ZETIA) 10 MG tablet Take 1 tablet (10 mg total) by mouth daily. 90 tablet 3  . Ferrous Sulfate (IRON) 325 (65 Fe) MG TABS Take 1 tablet by mouth 3 (three) times a week.    . fluticasone (FLONASE) 50 MCG/ACT nasal spray Place 1 spray into both nostrils daily as needed for allergies or rhinitis.    . furosemide (LASIX) 40 MG tablet Take 0.5-1 tablets (20-40 mg total) by mouth daily as needed for edema. 90 tablet 3  . gabapentin (NEURONTIN) 100 MG capsule Take 2 capsules (200 mg total) by mouth 3 (three) times daily. 180 capsule 6  . ibuprofen (ADVIL,MOTRIN) 600 MG tablet Take 1 tablet (600 mg total) by mouth 2 (two) times daily as needed. 60 tablet 3  . loratadine (CLARITIN) 10 MG tablet Take 10 mg by mouth daily.    [name] LORazepam (ATIVAN) 1 MG tablet Take 1 tablet (1 mg total) by mouth 2 (two) times daily as needed for anxiety. 180 tablet 1  . metFORMIN (GLUCOPHAGE) 500 MG tablet Take 1 tablet (500 mg total) by mouth 2 (two)  times daily with a meal. 180 tablet 3  . Omega-3 Fatty Acids (FISH OIL) 1200 MG CAPS Take 2,400 mg by mouth 2 (two) times daily.    . phentermine (ADIPEX-P) 37.5 MG tablet Take 1 tablet (37.5 mg total) by mouth daily before breakfast. 30 tablet 2  . potassium chloride (KLOR-CON) 8 MEQ tablet Take 1 tablet (8 mEq total) by mouth 2 (two) times daily. 180 tablet 3  . UNABLE TO FIND Med Name: SeroVital-hgh    . Vitamin D, Ergocalciferol, (DRISDOL) 50000 units CAPS capsule Take 1 capsule (50,000 Units total) by mouth every 30 (thirty) days. 3 capsule 4   Facility-Administered Medications Prior to Visit  Medication Dose Route Frequency Provider Last Rate Last Dose  . 0.9 %  sodium chloride infusion  500 mL Intravenous Continuous Pyrtle, Lajuan Lines, MD      . 0.9 %  sodium chloride infusion  500 mL Intravenous Continuous Pyrtle, Lajuan Lines, MD        ROS: Review of Systems  Constitutional: Negative for activity change, appetite change, chills, fatigue and unexpected weight change.  HENT: Negative for congestion, mouth sores and sinus pressure.   Eyes: Negative for visual disturbance.  Respiratory: Negative for cough and chest tightness.   Gastrointestinal: Negative for abdominal pain and nausea.  Genitourinary:  Negative for difficulty urinating, frequency and vaginal pain.  Musculoskeletal: Negative for back pain and gait problem.  Skin: Negative for pallor and rash.  Neurological: Negative for dizziness, tremors, weakness, numbness and headaches.  Psychiatric/Behavioral: Positive for sleep disturbance. Negative for confusion and suicidal ideas.    Objective:  BP 126/74 (BP Location: Left Arm, Patient Position: Sitting, Cuff Size: Normal)   Pulse 71   Temp 98.1 F (36.7 C) (Oral)   Ht 5\' 4"  (1.626 m)   Wt 157 lb (71.2 kg)   SpO2 99%   BMI 26.95 kg/m   BP Readings from Last 3 Encounters:  02/03/18 126/74  01/04/18 112/64  08/12/17 102/62    Wt Readings from Last 3 Encounters:  02/03/18 157  lb (71.2 kg)  01/04/18 160 lb (72.6 kg)  08/12/17 187 lb (84.8 kg)    Physical Exam Constitutional:      General: She is not in acute distress.    Appearance: She is well-developed.  HENT:     Head: Normocephalic.     Right Ear: External ear normal.     Left Ear: External ear normal.     Nose: Nose normal.  Eyes:     General:        Right eye: No discharge.        Left eye: No discharge.     Conjunctiva/sclera: Conjunctivae normal.     Pupils: Pupils are equal, round, and reactive to light.  Neck:     Musculoskeletal: Normal range of motion and neck supple.     Thyroid: No thyromegaly.     Vascular: No JVD.     Trachea: No tracheal deviation.  Cardiovascular:     Rate and Rhythm: Normal rate and regular rhythm.     Heart sounds: Normal heart sounds.  Pulmonary:     Effort: No respiratory distress.     Breath sounds: No stridor. No wheezing.  Abdominal:     General: Bowel sounds are normal. There is no distension.     Palpations: Abdomen is soft. There is no mass.     Tenderness: There is no abdominal tenderness. There is no guarding or rebound.  Musculoskeletal:        General: No tenderness.  Lymphadenopathy:     Cervical: No cervical adenopathy.  Skin:    Findings: No erythema or rash.  Neurological:     Cranial Nerves: No cranial nerve deficit.     Motor: No abnormal muscle tone.     Coordination: Coordination normal.     Deep Tendon Reflexes: Reflexes normal.  Psychiatric:        Behavior: Behavior normal.        Thought Content: Thought content normal.        Judgment: Judgment normal.     Lab Results  Component Value Date   WBC 5.6 12/23/2017   HGB 13.4 12/23/2017   HCT 38.5 12/23/2017   PLT 253.0 12/23/2017   GLUCOSE 78 02/03/2018   CHOL 163 12/23/2017   TRIG 80.0 12/23/2017   HDL 52.70 12/23/2017   LDLDIRECT 148.2 02/03/2013   LDLCALC 95 12/23/2017   ALT 10 12/23/2017   AST 13 12/23/2017   NA 140 02/03/2018   K 3.5 02/03/2018   CL 102  02/03/2018   CREATININE 0.92 02/03/2018   BUN 16 02/03/2018   CO2 29 02/03/2018   TSH 1.11 12/23/2017   INR 1.14 11/21/2012   HGBA1C 5.3 02/03/2018   MICROALBUR 0.9 12/23/2017    Ct  Cardiac Scoring  Addendum Date: 02/01/2018   ADDENDUM REPORT: 02/01/2018 10:36 CLINICAL DATA:  Risk stratification EXAM: Coronary Calcium Score TECHNIQUE: The patient was scanned on a Siemens Somatom 64 slice scanner. Axial non-contrast 3 mm slices were carried out through the heart. The data set was analyzed on a dedicated work station and scored using the Girard. FINDINGS: Non-cardiac: See separate report from [facility]. Ascending aorta: Normal diameter 3.2 cm Pericardium: Normal Coronary arteries: No calcium noted in coronary arteries IMPRESSION: Coronary calcium score of 0. Jenkins Rouge Electronically Signed   By: Jenkins Rouge M.D.   On: 02/01/2018 10:36   Result Date: 02/01/2018 EXAM: OVER-READ INTERPRETATION  CT CHEST The following report is an over-read performed by radiologist Dr. Rolm Baptise of [facility], [place] on 01/27/2018. This over-read does not include interpretation of cardiac or coronary anatomy or pathology. The coronary calcium score interpretation by the cardiologist is attached. COMPARISON:  None. FINDINGS: Vascular: Heart is normal size.  Visualized aorta is normal caliber. Mediastinum/Nodes: No adenopathy in the lower mediastinum or hila. Lungs/Pleura: Visualized lungs clear.  No effusion. Upper Abdomen: Imaging into the upper abdomen shows no acute findings. Musculoskeletal: Chest wall soft tissues are unremarkable. No acute bony abnormality. IMPRESSION: No acute or significant extracardiac abnormality. Electronically Signed: By: Rolm Baptise M.D. On: 01/27/2018 11:48    Assessment & Plan:   There are no diagnoses linked to this encounter.   No orders of the defined types were placed in this encounter.    Follow-up: No follow-ups on file.  Walker Kehr, MD

## 2018-02-07 DIAGNOSIS — G4733 Obstructive sleep apnea (adult) (pediatric): Secondary | ICD-10-CM | POA: Diagnosis not present

## 2018-02-24 ENCOUNTER — Other Ambulatory Visit: Payer: Self-pay | Admitting: Internal Medicine

## 2018-02-24 DIAGNOSIS — Z0000 Encounter for general adult medical examination without abnormal findings: Secondary | ICD-10-CM

## 2018-02-24 DIAGNOSIS — E538 Deficiency of other specified B group vitamins: Secondary | ICD-10-CM

## 2018-02-24 DIAGNOSIS — F411 Generalized anxiety disorder: Secondary | ICD-10-CM

## 2018-02-24 DIAGNOSIS — J4520 Mild intermittent asthma, uncomplicated: Secondary | ICD-10-CM

## 2018-02-24 DIAGNOSIS — R413 Other amnesia: Secondary | ICD-10-CM

## 2018-02-24 DIAGNOSIS — I8002 Phlebitis and thrombophlebitis of superficial vessels of left lower extremity: Secondary | ICD-10-CM

## 2018-02-24 DIAGNOSIS — E119 Type 2 diabetes mellitus without complications: Secondary | ICD-10-CM

## 2018-02-24 DIAGNOSIS — I10 Essential (primary) hypertension: Secondary | ICD-10-CM

## 2018-03-02 ENCOUNTER — Ambulatory Visit: Payer: Self-pay | Admitting: *Deleted

## 2018-03-02 NOTE — Telephone Encounter (Signed)
Patient is wanting a Nurse to call back because she wants to know which medication is causing her Leg pain. She stated she is using a cane to walk now. She is unsure what this is. She wants a call back with advice on what she needs to stop taking.   Patient states she used to get leg cramps at night. She has seen Dr Tamala Julian for that. Patient states she has increased her water and it helped. Patient states last night was terrible. Patient states she has pain (cramping) in left ankle up her leg to knee. Patient has been using muscle rub, bio spray and cane.  Patient states it has resolved and she is better today- but she does not want to have another night like last night. Call to office to see if patient should be scheduled with PCP vs Tamala Julian. Patient is ok either way- she is free every day for the rest of the week.  Reason for Disposition . [1] MODERATE pain (e.g., interferes with normal activities, limping) AND [2] present > 3 days    Patient had moderate/severe cramping in left leg last night- it has resolved and she is better this morning. She states she has seen Dr Tamala Julian for this in the past. Patient also states she had numbness in her left hand- hand only- not in arm. Patient wants to have it checked- she states she does not want to have another night like last night. Patient is free to come into office this week. Call to office- they will review and decide if patient should see Dr Tamala Julian vs PCP  Answer Assessment - Initial Assessment Questions 1. ONSET: "When did the pain start?"      Last night- cramping 2. LOCATION: "Where is the pain located?"      Ankle to knee- left 3. PAIN: "How bad is the pain?"    (Scale 1-10; or mild, moderate, severe)   -  MILD (1-3): doesn't interfere with normal activities    -  MODERATE (4-7): interferes with normal activities (e.g., work or school) or awakens from sleep, limping    -  SEVERE (8-10): excruciating pain, unable to do any normal activities, unable to  walk     Moderate/severe 4. WORK OR EXERCISE: "Has there been any recent work or exercise that involved this part of the body?"      No- patient has been working in garden 5. CAUSE: "What do you think is causing the leg pain?"     Not sure 6. OTHER SYMPTOMS: "Do you have any other symptoms?" (e.g., chest pain, back pain, breathing difficulty, swelling, rash, fever, numbness, weakness)     Numbness in left hand only 7. PREGNANCY: "Is there any chance you are pregnant?" "When was your last menstrual period?"     n/a  Protocols used: LEG PAIN-A-AH

## 2018-03-02 NOTE — Telephone Encounter (Signed)
Yes  Magnesium 400mg  at night may be  Also a lot of nighttime dosing is from iron deficiency and 65mg  of iron with 500mg  of vitamin C can help If continues for over a week or worsens need to see someone.

## 2018-03-02 NOTE — Telephone Encounter (Signed)
Discussed with pt

## 2018-03-02 NOTE — Telephone Encounter (Signed)
Patient called back and wants to know if Magnesium would be helpful- she has been dieting recently and she is wondering if her vitamin level could be low.

## 2018-03-07 ENCOUNTER — Other Ambulatory Visit: Payer: Self-pay | Admitting: Internal Medicine

## 2018-03-08 ENCOUNTER — Other Ambulatory Visit: Payer: Self-pay | Admitting: Internal Medicine

## 2018-03-08 DIAGNOSIS — R413 Other amnesia: Secondary | ICD-10-CM

## 2018-03-08 DIAGNOSIS — E538 Deficiency of other specified B group vitamins: Secondary | ICD-10-CM

## 2018-03-08 DIAGNOSIS — E119 Type 2 diabetes mellitus without complications: Secondary | ICD-10-CM

## 2018-03-08 DIAGNOSIS — F411 Generalized anxiety disorder: Secondary | ICD-10-CM

## 2018-03-08 DIAGNOSIS — Z0000 Encounter for general adult medical examination without abnormal findings: Secondary | ICD-10-CM

## 2018-03-08 DIAGNOSIS — I10 Essential (primary) hypertension: Secondary | ICD-10-CM

## 2018-03-08 DIAGNOSIS — J4520 Mild intermittent asthma, uncomplicated: Secondary | ICD-10-CM

## 2018-03-08 DIAGNOSIS — I8002 Phlebitis and thrombophlebitis of superficial vessels of left lower extremity: Secondary | ICD-10-CM

## 2018-03-10 DIAGNOSIS — R69 Illness, unspecified: Secondary | ICD-10-CM | POA: Diagnosis not present

## 2018-03-10 DIAGNOSIS — G4733 Obstructive sleep apnea (adult) (pediatric): Secondary | ICD-10-CM | POA: Diagnosis not present

## 2018-03-15 ENCOUNTER — Telehealth: Payer: Self-pay | Admitting: Internal Medicine

## 2018-03-15 NOTE — Telephone Encounter (Signed)
Copied from [place] [id number]. Topic: Quick Communication - See Telephone Encounter >> Mar 15, 2018 11:22 AM Margot Ables wrote: CRM for notification. See Telephone encounter for: 03/15/18. Pt called stating she has been taking OTC Iron supplement and was asking if she needs to continue as she has been taking for a year. Please advise.

## 2018-03-16 NOTE — Telephone Encounter (Signed)
Please advise 

## 2018-03-17 NOTE — Telephone Encounter (Signed)
She can stop iron.  Her blood count is normal.  Thank you

## 2018-03-17 NOTE — Telephone Encounter (Signed)
LM notifying pt

## 2018-04-03 ENCOUNTER — Other Ambulatory Visit: Payer: Self-pay | Admitting: Internal Medicine

## 2018-04-05 ENCOUNTER — Other Ambulatory Visit: Payer: Self-pay | Admitting: Internal Medicine

## 2018-04-05 MED ORDER — IBUPROFEN 600 MG PO TABS: 600.0000 mg | ORAL_TABLET | Freq: Two times a day (BID) | ORAL | 2 refills | Status: DC | PRN

## 2018-04-10 DIAGNOSIS — G4733 Obstructive sleep apnea (adult) (pediatric): Secondary | ICD-10-CM | POA: Diagnosis not present

## 2018-04-20 ENCOUNTER — Telehealth: Payer: Self-pay

## 2018-04-20 NOTE — Telephone Encounter (Signed)
OK VOV OK to skip labs Thx

## 2018-04-20 NOTE — Telephone Encounter (Signed)
Please advise about what labs you would like done

## 2018-04-20 NOTE — Telephone Encounter (Signed)
Pt notified, VOV set up. Pt is still wanting to come in for labs so she will do standing order labs

## 2018-04-20 NOTE — Telephone Encounter (Signed)
Copied from [place] [id number]. Topic: Appointment Scheduling - Scheduling Inquiry for Clinic >> Apr 20, 2018  9:37 AM Margot Ables wrote: Reason for CRM: Pt wanting A1C and routine labs prior to appt scheduled for 05/03/2018. Pt is ok with virtual visit for the appt with Dr. Alain Marion but said she needs to know about labs. Please call back at [phone]. >> Apr 20, 2018  9:50 AM Kristine Royal J wrote: Do labs need to be ordered?

## 2018-04-27 ENCOUNTER — Other Ambulatory Visit (INDEPENDENT_AMBULATORY_CARE_PROVIDER_SITE_OTHER): Payer: Medicare HMO

## 2018-04-27 DIAGNOSIS — E119 Type 2 diabetes mellitus without complications: Secondary | ICD-10-CM | POA: Diagnosis not present

## 2018-04-27 LAB — BASIC METABOLIC PANEL
BUN: 16 mg/dL (ref 6–23)
CO2: 28 mEq/L (ref 19–32)
Calcium: 10 mg/dL (ref 8.4–10.5)
Chloride: 100 mEq/L (ref 96–112)
Creatinine, Ser: 0.86 mg/dL (ref 0.40–1.20)
GFR: 64.71 mL/min (ref >60.00)
Glucose, Bld: 84 mg/dL (ref 70–99)
Potassium: 3.5 mEq/L (ref 3.5–5.1)
Sodium: 138 mEq/L (ref 135–145)

## 2018-04-27 LAB — HEMOGLOBIN A1C: Hgb A1c MFr Bld: 5.6 % (ref 4.6–6.5)

## 2018-04-27 NOTE — Telephone Encounter (Signed)
Tiffany Gibbs the Pt was coming into [place] today after lunch and wanted to know if she can come in and do blood work while in town/ please call Pt and advise before lunch

## 2018-04-27 NOTE — Telephone Encounter (Signed)
Pt notified, okay to do labs today

## 2018-05-03 ENCOUNTER — Ambulatory Visit: Payer: Medicare HMO | Admitting: Internal Medicine

## 2018-05-04 ENCOUNTER — Ambulatory Visit (INDEPENDENT_AMBULATORY_CARE_PROVIDER_SITE_OTHER): Payer: Medicare HMO | Admitting: Internal Medicine

## 2018-05-04 ENCOUNTER — Encounter: Payer: Self-pay | Admitting: Internal Medicine

## 2018-05-04 DIAGNOSIS — F3340 Major depressive disorder, recurrent, in remission, unspecified: Secondary | ICD-10-CM

## 2018-05-04 DIAGNOSIS — E119 Type 2 diabetes mellitus without complications: Secondary | ICD-10-CM

## 2018-05-04 DIAGNOSIS — I10 Essential (primary) hypertension: Secondary | ICD-10-CM

## 2018-05-04 DIAGNOSIS — F419 Anxiety disorder, unspecified: Secondary | ICD-10-CM

## 2018-05-04 DIAGNOSIS — R69 Illness, unspecified: Secondary | ICD-10-CM | POA: Diagnosis not present

## 2018-05-04 DIAGNOSIS — E538 Deficiency of other specified B group vitamins: Secondary | ICD-10-CM

## 2018-05-04 NOTE — Assessment & Plan Note (Signed)
Furosemide prn 

## 2018-05-04 NOTE — Progress Notes (Signed)
Virtual Visit via Telephone Note  I connected with Tiffany Gibbs on 05/04/18 at  3:20 PM EDT by telephone and verified that I am speaking with the correct person using two identifiers.   I discussed the limitations, risks, security and privacy concerns of performing an evaluation and management service by telephone and the availability of in person appointments. I also discussed with the patient that there may be a patient responsible charge related to this service. The patient expressed understanding and agreed to proceed.   History of Present Illness:   F/u anxiety, B12 def, HTN f/u Observations/Objective:  NAD Assessment and Plan:  See Plan Follow Up Instructions:    I discussed the assessment and treatment plan with the patient. The patient was provided an opportunity to ask questions and all were answered. The patient agreed with the plan and demonstrated an understanding of the instructions.   The patient was advised to call back or seek an in-person evaluation if the symptoms worsen or if the condition fails to improve as anticipated.  I provided 20 minutes of non-face-to-face time during this encounter.   Walker Kehr, MD

## 2018-05-04 NOTE — Assessment & Plan Note (Signed)
On Celexa 

## 2018-05-04 NOTE — Assessment & Plan Note (Signed)
Metformin 

## 2018-05-04 NOTE — Assessment & Plan Note (Signed)
Celexa

## 2018-05-04 NOTE — Assessment & Plan Note (Signed)
Vit B12 

## 2018-05-05 ENCOUNTER — Ambulatory Visit: Payer: Medicare HMO | Admitting: Internal Medicine

## 2018-05-09 ENCOUNTER — Other Ambulatory Visit: Payer: Self-pay | Admitting: Internal Medicine

## 2018-05-10 ENCOUNTER — Telehealth: Payer: Self-pay | Admitting: Internal Medicine

## 2018-05-10 NOTE — Telephone Encounter (Signed)
Copied from [place] [phone]. Topic: General - Other >> May 10, 2018  2:25 PM Celene Kras A wrote: Reason for CRM: Pt called stating her brother did not have a heart attack and is asking to fix this on her chart. Please advise.

## 2018-05-10 NOTE — Telephone Encounter (Signed)
Chart fixed.

## 2018-05-11 DIAGNOSIS — G4733 Obstructive sleep apnea (adult) (pediatric): Secondary | ICD-10-CM | POA: Diagnosis not present

## 2018-05-12 ENCOUNTER — Other Ambulatory Visit: Payer: Self-pay

## 2018-05-12 ENCOUNTER — Encounter: Payer: Self-pay | Admitting: Sports Medicine

## 2018-05-12 ENCOUNTER — Ambulatory Visit (INDEPENDENT_AMBULATORY_CARE_PROVIDER_SITE_OTHER): Payer: Medicare HMO | Admitting: Sports Medicine

## 2018-05-12 VITALS — BP 109/68 | HR 86 | Temp 97.9°F | Resp 16

## 2018-05-12 DIAGNOSIS — M79671 Pain in right foot: Secondary | ICD-10-CM

## 2018-05-12 DIAGNOSIS — M79674 Pain in right toe(s): Secondary | ICD-10-CM

## 2018-05-12 DIAGNOSIS — M79675 Pain in left toe(s): Secondary | ICD-10-CM

## 2018-05-12 DIAGNOSIS — M79672 Pain in left foot: Secondary | ICD-10-CM

## 2018-05-12 DIAGNOSIS — E1142 Type 2 diabetes mellitus with diabetic polyneuropathy: Secondary | ICD-10-CM

## 2018-05-12 DIAGNOSIS — B351 Tinea unguium: Secondary | ICD-10-CM

## 2018-05-12 DIAGNOSIS — R252 Cramp and spasm: Secondary | ICD-10-CM | POA: Diagnosis not present

## 2018-05-12 NOTE — Progress Notes (Signed)
Subjective: Tiffany Gibbs is a 73 y.o. female patient with history of diabetes who presents to office today complaining of long,mildly painful nails  while ambulating in shoes; unable to trim, reports that the great toenails are the thickness and is currently using the Penlac solution for the last 2 years and states that the nails are still discolored but the thickness is slowly improving states that she also feels a cramping sensation to her toes for the last 3 to 4 years. Patient states that the glucose reading this morning was not recorded last A1c 5.6 and last saw her primary care doctor 2 weeks ago.  Patient denies nausea vomiting fever chills or any other constitutional symptoms at this time.  Review of Systems  Neurological: Positive for tingling.  All other systems reviewed and are negative.    Patient Active Problem List   Diagnosis Date Noted  . 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 Obstructive sleep apnea treated with continuous positive airway pressure (CPAP) 06/04/2017  . Degenerative arthritis of knee, bilateral 05/25/2017  . Left lumbar radiculopathy 04/27/2017  . Right knee pain 04/27/2017  . Tick bite 08/18/2016  . Drusen of macula of both eyes 05/04/2016  . Dry eyes, bilateral 05/04/2016  . Pseudophakia of both eyes 05/04/2016  . PVD (posterior vitreous detachment), both eyes 05/04/2016  . Dupuytren's disease of palm 04/01/2016  . Degenerative disc disease, lumbar 12/04/2015  . Elevated hemoglobin ([place]) 04/17/2015  . Occipital neuralgia of right side 04/17/2015  . Well adult exam 11/19/2014  . Asthmatic bronchitis 10/15/2014  . Neck pain on right side 10/15/2014  . Acute sinusitis 09/26/2014  . Dyslipidemia 06/18/2014  . Fracture of middle phalanx of finger 05/02/2014  . Hand pain, right 04/19/2014  . DM2 (diabetes mellitus, type 2) ([place]) 04/19/2014  . Hematoma of leg 12/15/2013  . Phlebitis of superficial vein of lower extremity  11/30/2013  . Otitis, externa, infective 11/30/2013  . Onychomycosis 08/30/2013  . Lower leg pain 05/10/2013  . Corn or callus 02/09/2013  . Acute posthemorrhagic anemia 01/19/2013  . Constipation 12/16/2012  . Left hip pain 12/16/2012  . Osteoarthritis of hip Left 11/27/2012  . Rash and nonspecific skin eruption 07/27/2012  . Cough 03/31/2012  . Diarrhea 02/05/2012  . Microhematuria 09/09/2011  . Elevated LFTs 09/09/2011  . B12 deficiency 06/03/2011  . Cellulitis 03/10/2011  . Edema 03/10/2011  . Paresthesia of arm 02/25/2011  . Memory difficulty 11/03/2010  . Fibromyalgia syndrome 07/10/2010  . OSA (obstructive sleep apnea) 07/02/2010  . SIALADENITIS 11/25/2009  . TOBACCO USE, QUIT 04/04/2009  . ALLERGIC RHINITIS CAUSE UNSPECIFIED 10/04/2008  . URTICARIA 10/04/2008  . URINARY RETENTION 09/27/2008  . HIP PAIN 07/17/2008  . CERVICAL RADICULITIS 07/21/2007  . Headache 07/21/2007  . Osteoporosis 02/16/2007  . LOW BACK PAIN 12/27/2006  . Obesity 08/07/2006  . Anxiety 08/07/2006  . Depression 08/07/2006  . Hypertension, essential 08/07/2006  . OSTEOARTHRITIS 08/07/2006   Current Outpatient Medications on File Prior to Visit  Medication Sig Dispense Refill  . amoxicillin (AMOXIL) 500 MG capsule as needed.    [name] aspirin EC 81 MG tablet Take 81 mg by mouth daily.    . ciclopirox (PENLAC) 8 % solution APPLY OVER NAIL AND SURROUNDING SKIN DAILY AT BEDTIME. APPLY DAILY OVER PREVIOUS COAT. AFTER 7 DAYS MAY REMOVE WITH ALCOHOL AND CONTINUE CYC 6.6 mL 0  . citalopram (CELEXA) 40 MG tablet TAKE 1 TABLET DAILY 90 tablet 1  . clotrimazole-betamethasone (LOTRISONE) cream  Apply 1 application topically 2 (two) times daily. (Patient taking differently: Apply 1 application topically as needed. ) 45 g 1  . Cyanocobalamin (B-12) 2500 MCG SUBL Place under the tongue.    . diphenhydrAMINE (BENADRYL) 25 MG tablet Take 25 mg by mouth at bedtime as needed.     . ezetimibe (ZETIA) 10 MG tablet TAKE  ONE TABLET BY MOUTH ONE TIME DAILY  90 tablet 2  . fluticasone (FLONASE) 50 MCG/ACT nasal spray Place 1 spray into both nostrils daily as needed for allergies or rhinitis.    . furosemide (LASIX) 40 MG tablet Take 0.5-1 tablets (20-40 mg total) by mouth daily as needed for edema. 90 tablet 3  . ibuprofen (ADVIL,MOTRIN) 600 MG tablet Take 1 tablet (600 mg total) by mouth 2 (two) times daily as needed. 60 tablet 2  . loratadine (CLARITIN) 10 MG tablet Take 10 mg by mouth daily.    [name] LORazepam (ATIVAN) 1 MG tablet Take 1 tablet (1 mg total) by mouth 2 (two) times daily as needed for anxiety. 180 tablet 1  . metFORMIN (GLUCOPHAGE) 500 MG tablet TAKE 1 TABLET TWICE DAILY  WITH MEALS 180 tablet 3  . Omega-3 Fatty Acids (FISH OIL) 1200 MG CAPS Take 2,400 mg by mouth 2 (two) times daily.    . phentermine (ADIPEX-P) 37.5 MG tablet TAKE ONE TABLET BY MOUTH ONE TIME DAILY  BEFORE BREAKFAST 30 tablet 2  . potassium chloride (KLOR-CON) 8 MEQ tablet Take 1 tablet (8 mEq total) by mouth 2 (two) times daily. 180 tablet 3  . Vitamin D, Ergocalciferol, (DRISDOL) 50000 units CAPS capsule Take 1 capsule (50,000 Units total) by mouth every 30 (thirty) days. 3 capsule 4   Current Facility-Administered Medications on File Prior to Visit  Medication Dose Route Frequency Provider Last Rate Last Dose  . 0.9 %  sodium chloride infusion  500 mL Intravenous Continuous Pyrtle, Lajuan Lines, MD      . 0.9 %  sodium chloride infusion  500 mL Intravenous Continuous Pyrtle, Lajuan Lines, MD       Allergies  Allergen Reactions  . Celecoxib     swell  . Enalapril     cough  . Losartan     falls  . Lovastatin     REACTION: aches  . Naproxen   . Pravachol [Pravastatin Sodium]     Nausea     Recent Results (from the past 2160 hour(s))  Hemoglobin A1c     Status: None   Collection Time: 04/27/18  2:18 PM  Result Value Ref Range   Hgb A1c MFr Bld 5.6 4.6 - 6.5 %    Comment: Glycemic Control Guidelines for People with Diabetes:Non  Diabetic:  <6%Goal of Therapy: <7%Additional Action Suggested:  >7%   Basic metabolic panel     Status: None   Collection Time: 04/27/18  2:18 PM  Result Value Ref Range   Sodium 138 135 - 145 mEq/L   Potassium 3.5 3.5 - 5.1 mEq/L   Chloride 100 96 - 112 mEq/L   CO2 28 19 - 32 mEq/L   Glucose, Bld 84 70 - 99 mg/dL   BUN 16 6 - 23 mg/dL   Creatinine, Ser 0.86 0.40 - 1.20 mg/dL   Calcium 10.0 8.4 - 10.5 mg/dL   GFR 64.71 >60.00 mL/min    Objective: General: Patient is awake, alert, and oriented x 3 and in no acute distress.  Integument: Skin is warm, dry and supple bilateral. Nails are tender, long,  thickened and  dystrophic with subungual debris, consistent with onychomycosis, 1-5 bilateral with bilateral hallux most involved. No signs of infection. No open lesions or preulcerative lesions present bilateral. Remaining integument unremarkable.  Vasculature:  Dorsalis Pedis pulse 1/4 bilateral. Posterior Tibial pulse  1/4 bilateral.  Capillary fill time <3 sec 1-5 bilateral. Positive hair growth to the level of the digits. Temperature gradient within normal limits.  Minimal varicosities present bilateral. No edema present bilateral.   Neurology: The patient has intact sensation measured with a 5.07/10g Semmes Weinstein Monofilament at all pedal sites bilateral . Vibratory sensation diminished bilateral with tuning fork. No Babinski sign present bilateral.  Subjective cramping occasionally in both feet and lower extremities.  Musculoskeletal: Asymptomatic hammertoe pedal deformities noted bilateral. Muscular strength 5/5 in all lower extremity muscular groups bilateral without pain on range of motion . No tenderness with calf compression bilateral.  Assessment and Plan: Problem List Items Addressed This Visit    None    Visit Diagnoses    Pain due to onychomycosis of toenails of both feet    -  Primary   Bilateral foot pain       Diabetic polyneuropathy associated with type 2 diabetes  mellitus (HCC)       Foot cramps          -Examined patient. -Discussed and educated patient on diabetic foot care, especially with  regards to the vascular, neurological and musculoskeletal systems.  -Stressed the importance of good glycemic control and the detriment of not  controlling glucose levels in relation to the foot. -Mechanically debrided all nails 1-5 bilateral using sterile nail nipper and filed with dremel without incident  -Advised patient that likely her symptoms are related to neuropathy as far as the awkward sensation and tingling and cramping advised patient to return to taking her gabapentin that she has to slowly titrating up to a max of 300 mg at bedtime to see if this will help also advised patient to drink tonic water at bedtime to see if this will also help -Answered all patient questions -Patient to return  in 3 months for at risk foot care and advised patient to file after each use of the Penlac to allow it to have the best result with helping to clear out the yellow discoloration to both big toenails -Patient advised to call the office if any problems or questions arise in the meantime.  Landis Martins, DPM

## 2018-05-30 ENCOUNTER — Telehealth: Payer: Self-pay | Admitting: Internal Medicine

## 2018-05-30 NOTE — Telephone Encounter (Signed)
Pt notified and voiced understanding that she was at the max does for this medication

## 2018-05-30 NOTE — Telephone Encounter (Signed)
Copied from [place] [phone]. Topic: Quick Communication - Rx Refill/Question >> May 30, 2018  8:48 AM Mathis Bud wrote: Medication:  phentermine (ADIPEX-P) 37.5 MG  Has the patient contacted their pharmacy? Yes, pharmacy does have refills. Patient would like to discuss/get approved for medication to have 45 pills.  Patient states that she feels like she is starving.  Patient would like Inez Catalina to call her if possibly as well. [phone]  Preferred Pharmacy (with phone number or street name): [facility] [id number] [place] [phone] (Phone) [phone] (Fax)    Agent: Please be advised that RX refills may take up to 3 business days. We ask that you follow-up with your pharmacy.

## 2018-05-30 NOTE — Telephone Encounter (Signed)
LMTCB, pt is currently at max dose

## 2018-06-01 ENCOUNTER — Telehealth: Payer: Self-pay | Admitting: Neurology

## 2018-06-01 NOTE — Telephone Encounter (Signed)
Due to current COVID 19 pandemic, our office is severely reducing in office visits until further notice, in order to minimize the risk to our patients and healthcare providers.  ° °Called patient and confirmed a virtual visit for her 5/28 appointment. Patient verbalized understanding of the [email or web address] process and I have sent her an e-mail with link and directions as well as my name and office number/hours for reference. Patient understands that she will receive a call from RN to update chart. ° °Pt understands that although there may be some limitations with this type of visit, we will take all precautions to reduce any security or privacy concerns.  Pt understands that this will be treated like an in office visit and we will file with pt's insurance, and there may be a patient responsible charge related to this service. ° °

## 2018-06-02 ENCOUNTER — Other Ambulatory Visit: Payer: Self-pay | Admitting: Internal Medicine

## 2018-06-02 MED ORDER — POTASSIUM CHLORIDE ER 8 MEQ PO TBCR: 8.0000 meq | EXTENDED_RELEASE_TABLET | Freq: Two times a day (BID) | ORAL | 3 refills | Status: DC

## 2018-06-07 ENCOUNTER — Encounter: Payer: Self-pay | Admitting: Neurology

## 2018-06-08 ENCOUNTER — Encounter: Payer: Self-pay | Admitting: Neurology

## 2018-06-08 NOTE — Telephone Encounter (Signed)
Pt's cpap data has not updated since September of 2019. I called pt. She says that she has been using her cpap machine. I recommended that she call [employer] to get this fixed. She asked that I reach out to [place] to discuss on her behalf. I will do this and asked pt to call me back if she has not heard from them in a week. Pt asked that her appt tomorrow be cancelled until this can be fixed.

## 2018-06-08 NOTE — Telephone Encounter (Signed)
I called pt. Pt's meds, allergies, and PMH were updated.  Pt reports that her cpap is going well, except that she has leaks from her mask.  Pt verbalized understanding of the [email or web address] process.

## 2018-06-08 NOTE — Telephone Encounter (Signed)
I called pt, we received a download from [facility]. Pt was placed back on the schedule. I texted pt the [email or web address] link and she verbalized understanding of that process.

## 2018-06-09 ENCOUNTER — Ambulatory Visit: Payer: Medicare HMO | Admitting: Neurology

## 2018-06-09 ENCOUNTER — Ambulatory Visit: Payer: Medicare HMO | Admitting: Nurse Practitioner

## 2018-06-09 ENCOUNTER — Other Ambulatory Visit: Payer: Self-pay

## 2018-06-11 DIAGNOSIS — G4733 Obstructive sleep apnea (adult) (pediatric): Secondary | ICD-10-CM | POA: Diagnosis not present

## 2018-06-13 ENCOUNTER — Encounter: Payer: Self-pay | Admitting: Neurology

## 2018-06-13 ENCOUNTER — Other Ambulatory Visit: Payer: Self-pay

## 2018-06-13 ENCOUNTER — Ambulatory Visit (INDEPENDENT_AMBULATORY_CARE_PROVIDER_SITE_OTHER): Payer: Medicare HMO | Admitting: Neurology

## 2018-06-13 DIAGNOSIS — Z9989 Dependence on other enabling machines and devices: Secondary | ICD-10-CM | POA: Diagnosis not present

## 2018-06-13 DIAGNOSIS — G4733 Obstructive sleep apnea (adult) (pediatric): Secondary | ICD-10-CM

## 2018-06-13 DIAGNOSIS — R634 Abnormal weight loss: Secondary | ICD-10-CM | POA: Diagnosis not present

## 2018-06-13 NOTE — Patient Instructions (Signed)
Given verbally, during today's virtual video-based encounter, with verbal feedback received.   

## 2018-06-13 NOTE — Progress Notes (Signed)
Order for cpap supplies sent to [facility] via community message. Confirmation received that the order transmitted was successful.

## 2018-06-13 NOTE — Progress Notes (Signed)
Interim history:  Tiffany Gibbs is a 73 year old right-handed woman with an underlying medical history of anxiety, depression, hypertension, cataracts, osteoarthritis, type 2 diabetes, low back pain, osteoporosis, hyperlipidemia, fibromyalgia, status post right total hip arthroplasty, abdominal hysterectomy, and obesity, who presents for a virtual, video based appointment via [email or web address] for follow-up consultation of her obstructive sleep apnea, well-established on CPAP therapy, for her yearly checkup.  The patient is unaccompanied today and joins from home, I am located in my office.  She was not able to participate in a virtual visit on 06/09/2018. I first met her on 05/08/2015 at the request of Dr. Leta Baptist, at which time she reported a prior diagnosis of obstructive sleep apnea.  She was on AutoPap at the time.  She had significant weight loss in the interim.  She was advised to proceed with sleep study testing.  She had a baseline sleep study on 06/25/2015 which showed an AHI of 17.2/h, O2 nadir of 84%.  She had a subsequent CPAP titration study in the lab on 08/12/2015 which showed great results with a CPAP pressure of 11 cm.  She was seen in the interim by Cecille Rubin, nurse practitioner on 06/09/2016, at which time she was compliant with CPAP.  She was seen by Cecille Rubin, nurse practitioner on 06/04/2017, at which time she was compliant with CPAP and doing well, advised to follow-up in 1 year.  Today, 06/13/2018: Please also see below for virtual visit documentation.  I reviewed her CPAP compliance data from 05/09/2018 through 06/07/2018 which is a total of 30 days, during which time she used her machine every night with percent used days greater than 4 hours at 100%, indicating superb compliance with an average usage of 7 hours and 10 minutes which is very good, residual AHI at goal at 0.3/h, leak high with a 95th percentile at 50.1 L/min and leak has been noted to be consistently high throughout,  pressure at 11 cm with EPR of 3.  Previously:  05/08/2015: (She) was previously diagnosed with obstructive sleep apnea many years ago, over 10 years ago. She had a sleep study at [facility] on 03/22/2001 which was interpreted by Dr. Danton Sewer. I reviewed the test results: Overall AHI was 68 per hour, REM sleep was not achieved. Lowest oxygen saturation was 87%. She had a CPAP titration study on 06/02/2001 also with on hospital. CPAP was titrated to 10 cm. He has seen her for recurrent headaches and dizziness. I reviewed your office note from 04/30/2015. She has several tests pending including brain MRI without contrast, brain MRA, neck MRA with and without contrast, echocardiogram. She has been on autoPAP therapy. It started bothering her. She has also lost weight over time. She was 230 pounds in March 2003, current weight is 187 pounds. She has essentially stopped using her AutoPap machine in early February 2015. She was intermittently compliant with it before. I reviewed compliance data from 01/20/2013 through 02/18/2013. She was poorly compliant at the time. She felt that the mask was bothersome and she did not necessarily feel improved in her sleep. She was having very vivid dreams at times. She has gained some weight back. She reports restless leg symptoms which are sometimes quite bothersome to her. Stretching helps. She also changed her bed and mattress and that helped as well. Her Epworth sleepiness score is 14 out of 24 today, her fatigue score is 35 out of 63. She denies morning headaches or nocturia. She and her husband sleep in separate  beds in the same room. She has 2 dogs who don't bother her at night. She has 1 grown son who lives in [place]. Her bedtime is usually between 10:30 and 11 PM. Wakeup time is around 6:30 on most mornings without alarm. She is not sure if she has a family history of obstructive sleep apnea, mother died at 65 with a PE, father died at 21 with a history of MS and  after her heart attack. She had a tonsillectomy as a child. She quit smoking in 85, does not drink alcohol and drinks usually 1 cup of coffee in the morning, occasional sodas. She has to take care of her husband who is 53 years older and suffers from dementia and other physical disabilities. She had an echocardiogram earlier this morning, results are pending. She is scheduled for her MRIs. She is also going to start physical therapy in [place].  Her Past Medical History Is Significant For: Past Medical History:  Diagnosis Date  . Anxiety   . Cataract   . Depression   . DM type 2 (diabetes mellitus, type 2) ([place])   . Fibromyalgia   . H/O dizziness   . Hives    from tomatoes  . HTN (hypertension)   . Hyperlipidemia   . Low back pain   . OSA (obstructive sleep apnea)    uses c-pap  . Osteoarthritis   . Osteoporosis   . SOB (shortness of breath) on exertion     Her Past Surgical History Is Significant For: Past Surgical History:  Procedure Laterality Date  . cervical cryotherapy     cervix  . EYE SURGERY     Cataracts/Bil  . LASIK Bilateral   . TOTAL ABDOMINAL HYSTERECTOMY    . TOTAL HIP ARTHROPLASTY  09/2008   right- [facility]/  . TOTAL HIP ARTHROPLASTY Left 11/28/2012   Procedure: TOTAL HIP ARTHROPLASTY;  Surgeon: Kerin Salen, MD;  Location: [facility];  Service: Orthopedics;  Laterality: Left;    Her Family History Is Significant For: Family History  Problem Relation Age of Onset  . Hypertension Mother   . Diabetes Mother   . Heart disease Father   . Multiple sclerosis Father   . Heart attack Father   . Kidney disease Brother 97       ESRD    Her Social History Is Significant For: Social History   Socioeconomic History  . Marital status: Married    Spouse name: Sam  . Number of children: 1  . Years of education: 40   . Highest education level: Not on file  Occupational History  . Occupation: N/A  Social Needs  . Financial resource strain: Not on file  . Food  insecurity:    Worry: Not on file    Inability: Not on file  . Transportation needs:    Medical: Not on file    Non-medical: Not on file  Tobacco Use  . Smoking status: Former Smoker    Packs/day: 2.00    Years: 25.00    Pack years: 50.00    Types: Cigarettes    Last attempt to quit: 01/13/1983    Years since quitting: 35.4  . Smokeless tobacco: Never Used  Substance and Sexual Activity  . Alcohol use: No    Alcohol/week: 0.0 standard drinks  . Drug use: No  . Sexual activity: Yes  Lifestyle  . Physical activity:    Days per week: Not on file    Minutes per session: Not on file  .  Stress: Not on file  Relationships  . Social connections:    Talks on phone: Not on file    Gets together: Not on file    Attends religious service: Not on file    Active member of club or organization: Not on file    Attends meetings of clubs or organizations: Not on file    Relationship status: Not on file  Other Topics Concern  . Not on file  Social History Narrative   Was divorced - newly re-married as of 2009, Sam   1 son      Caffeine use- coffee 1 cup daily     Her Allergies Are:  Allergies  Allergen Reactions  . Celecoxib     swell  . Enalapril     cough  . Losartan     falls  . Lovastatin     REACTION: aches  . Naproxen   . Pravachol [Pravastatin Sodium]     Nausea   :   Her Current Medications Are:  Outpatient Encounter Medications as of 06/13/2018  Medication Sig  . amoxicillin (AMOXIL) 500 MG capsule as needed.  [name] aspirin EC 81 MG tablet Take 81 mg by mouth daily.  . ciclopirox (PENLAC) 8 % solution APPLY OVER NAIL AND SURROUNDING SKIN DAILY AT BEDTIME. APPLY DAILY OVER PREVIOUS COAT. AFTER 7 DAYS MAY REMOVE WITH ALCOHOL AND CONTINUE CYC  . citalopram (CELEXA) 40 MG tablet TAKE 1 TABLET DAILY  . clotrimazole-betamethasone (LOTRISONE) cream Apply 1 application topically 2 (two) times daily. (Patient taking differently: Apply 1 application topically as needed. )  .  Cyanocobalamin (B-12) 2500 MCG SUBL Place under the tongue.  . diphenhydrAMINE (BENADRYL) 25 MG tablet Take 25 mg by mouth at bedtime as needed.   . ezetimibe (ZETIA) 10 MG tablet TAKE ONE TABLET BY MOUTH ONE TIME DAILY   . fluticasone (FLONASE) 50 MCG/ACT nasal spray Place 1 spray into both nostrils daily as needed for allergies or rhinitis.  . furosemide (LASIX) 40 MG tablet Take 0.5-1 tablets (20-40 mg total) by mouth daily as needed for edema.  [name] ibuprofen (ADVIL,MOTRIN) 600 MG tablet Take 1 tablet (600 mg total) by mouth 2 (two) times daily as needed.  . loratadine (CLARITIN) 10 MG tablet Take 10 mg by mouth daily.  [name] LORazepam (ATIVAN) 1 MG tablet Take 1 tablet (1 mg total) by mouth 2 (two) times daily as needed for anxiety.  . metFORMIN (GLUCOPHAGE) 500 MG tablet TAKE 1 TABLET TWICE DAILY  WITH MEALS  . Omega-3 Fatty Acids (FISH OIL) 1200 MG CAPS Take 2,400 mg by mouth 2 (two) times daily.  . phentermine (ADIPEX-P) 37.5 MG tablet TAKE ONE TABLET BY MOUTH ONE TIME DAILY  BEFORE BREAKFAST  . potassium chloride (KLOR-CON) 8 MEQ tablet Take 1 tablet (8 mEq total) by mouth 2 (two) times daily.  . Vitamin D, Ergocalciferol, (DRISDOL) 50000 units CAPS capsule Take 1 capsule (50,000 Units total) by mouth every 30 (thirty) days.   Facility-Administered Encounter Medications as of 06/13/2018  Medication  . 0.9 %  sodium chloride infusion  . 0.9 %  sodium chloride infusion  :  Review of Systems:  Out of a complete 14 point review of systems, all are reviewed and negative with the exception of these symptoms as listed below:  Virtual Visit via Video Note on 06/13/2018:   I connected with Tiffany Gibbs on 06/13/18 at 11:30 AM EDT by a video enabled telemedicine application and verified that I am speaking with the  correct person using two identifiers.   I discussed the limitations of evaluation and management by telemedicine and the availability of in person appointments. The patient expressed  understanding and agreed to proceed.  History of Present Illness: She reports Doing well with her CPAP, she had tried different interfaces, did not like the nasal mask, tried the nasal pillows and most recently had tried 1 of her husbands dream wear nasal interfaces which she likes but does notice a leak.  It may be too big for her head size as far as the headgear size, she would like to try this mask in the right size.  She has lost quite a bit of weight over time, in the past 1+ year she has lost about 50 pounds.   Observations/Objective: On examination, she is very pleasant and conversant, no acute distress, good comprehension and language skills, oriented, speech clear without dysarthria, hypophonia voice tremor noted.Face is symmetric with normal facial animation, extraocular movements well-preserved, hearing is grossly intact.  Airway examination reveals no significant mouth dryness, normal tongue movements, shoulder height is equal.Upper body mobility and coordination grossly intact.  Assessment and Plan: In summary, Tiffany Gibbs is a very pleasant 73 year old female with an underlying medical history of anxiety, depression, hypertension, cataracts, osteoarthritis, type 2 diabetes, low back pain, osteoporosis, hyperlipidemia, fibromyalgia, status post right total hip arthroplasty, abdominal hysterectomy, and overweight state, who Presents for follow-up consultation of her obstructive sleep apnea, well-established on CPAP therapy.  She is compliant with treatment and is currently using a nasal cradle interface, she is advised to proceed with a mask refit to get the proper sizing.  She is commended for her treatment adherence.  She has been able to lose a significant amount of weight in the past 1 to 2 years.  I suggested we try to reduce her pressure from 11 cm to 10 cm.  Her AHI is low and we may be Able to reduce the air leakage by reducing the pressure and of course also through a mask refit.  I  renewed a prescription for CPAP related supplies, requested a mask refit through her DME provider as well as a pressure reduction.  She is commended for her full compliance and advised to follow-up in 1 year routinely.  She is congratulated on her weight loss success as well.  I answered all her questions today and she was in agreement with the plan  Follow Up Instructions:    I discussed the assessment and treatment plan with the patient. The patient was provided an opportunity to ask questions and all were answered. The patient agreed with the plan and demonstrated an understanding of the instructions.   The patient was advised to call back or seek an in-person evaluation if the symptoms worsen or if the condition fails to improve as anticipated.  I provided 15 minutes of non-face-to-face time during this encounter.   Star Age, MD

## 2018-06-14 ENCOUNTER — Telehealth: Payer: Self-pay

## 2018-06-14 NOTE — Telephone Encounter (Signed)
I called pt, scheduled her yearly follow up with NP. Pt verbalized understanding of new appt date and time, asked for a reminder message on mychart.

## 2018-06-16 ENCOUNTER — Other Ambulatory Visit: Payer: Self-pay | Admitting: Internal Medicine

## 2018-06-16 MED ORDER — VITAMIN D (ERGOCALCIFEROL) 1.25 MG (50000 UNIT) PO CAPS: 50000.0000 [IU] | ORAL_CAPSULE | ORAL | 4 refills | Status: DC

## 2018-07-09 DIAGNOSIS — G4733 Obstructive sleep apnea (adult) (pediatric): Secondary | ICD-10-CM | POA: Diagnosis not present

## 2018-07-11 DIAGNOSIS — G4733 Obstructive sleep apnea (adult) (pediatric): Secondary | ICD-10-CM | POA: Diagnosis not present

## 2018-07-15 ENCOUNTER — Other Ambulatory Visit: Payer: Self-pay

## 2018-07-15 ENCOUNTER — Emergency Department (HOSPITAL_COMMUNITY)
Admission: EM | Admit: 2018-07-15 | Discharge: 2018-07-15 | Disposition: A | Discharge status: Home / Self Care | Payer: Medicare HMO | Attending: Emergency Medicine | Admitting: Emergency Medicine

## 2018-07-15 ENCOUNTER — Ambulatory Visit: Payer: Self-pay | Admitting: *Deleted

## 2018-07-15 ENCOUNTER — Encounter (HOSPITAL_COMMUNITY): Payer: Self-pay | Admitting: Emergency Medicine

## 2018-07-15 DIAGNOSIS — E119 Type 2 diabetes mellitus without complications: Secondary | ICD-10-CM | POA: Insufficient documentation

## 2018-07-15 DIAGNOSIS — I10 Essential (primary) hypertension: Secondary | ICD-10-CM | POA: Insufficient documentation

## 2018-07-15 DIAGNOSIS — Z87891 Personal history of nicotine dependence: Secondary | ICD-10-CM | POA: Diagnosis not present

## 2018-07-15 DIAGNOSIS — Z7982 Long term (current) use of aspirin: Secondary | ICD-10-CM | POA: Diagnosis not present

## 2018-07-15 DIAGNOSIS — M79604 Pain in right leg: Secondary | ICD-10-CM | POA: Insufficient documentation

## 2018-07-15 DIAGNOSIS — Z79899 Other long term (current) drug therapy: Secondary | ICD-10-CM | POA: Insufficient documentation

## 2018-07-15 LAB — CBC WITH DIFFERENTIAL/PLATELET
Abs Immature Granulocytes: 0.02 10*3/uL (ref 0.00–0.07)
Basophils Absolute: 0.1 10*3/uL (ref 0.0–0.1)
Basophils Relative: 1 %
Eosinophils Absolute: 0.1 10*3/uL (ref 0.0–0.5)
Eosinophils Relative: 2 %
HCT: 39.8 % (ref 36.0–46.0)
Hemoglobin: 13.7 g/dL (ref 12.0–15.0)
Immature Granulocytes: 0 %
Lymphocytes Relative: 33 %
Lymphs Abs: 2.2 10*3/uL (ref 0.7–4.0)
MCH: 30.5 pg (ref 26.0–34.0)
MCHC: 34.4 g/dL (ref 30.0–36.0)
MCV: 88.6 fL (ref 80.0–100.0)
Monocytes Absolute: 0.8 10*3/uL (ref 0.1–1.0)
Monocytes Relative: 11 %
Neutro Abs: 3.6 10*3/uL (ref 1.7–7.7)
Neutrophils Relative %: 53 %
Platelets: 309 10*3/uL (ref 150–400)
RBC: 4.49 MIL/uL (ref 3.87–5.11)
RDW: 12.4 % (ref 11.5–15.5)
WBC: 6.7 10*3/uL (ref 4.0–10.5)
nRBC: 0 % (ref 0.0–0.2)

## 2018-07-15 LAB — BASIC METABOLIC PANEL
Anion gap: 14 (ref 5–15)
BUN: 26 mg/dL — ABNORMAL HIGH (ref 8–23)
CO2: 24 mmol/L (ref 22–32)
Calcium: 9.9 mg/dL (ref 8.9–10.3)
Chloride: 98 mmol/L (ref 98–111)
Creatinine, Ser: 1.13 mg/dL — ABNORMAL HIGH (ref 0.44–1.00)
GFR calc Af Amer: 56 mL/min — ABNORMAL LOW (ref >60)
GFR calc non Af Amer: 48 mL/min — ABNORMAL LOW (ref >60)
Glucose, Bld: 94 mg/dL (ref 70–99)
Potassium: 3.9 mmol/L (ref 3.5–5.1)
Sodium: 136 mmol/L (ref 135–145)

## 2018-07-15 LAB — MAGNESIUM: Magnesium: 2.3 mg/dL (ref 1.7–2.4)

## 2018-07-15 LAB — D-DIMER, QUANTITATIVE: D-Dimer, Quant: 0.35 ug/mL-FEU (ref 0.00–0.50)

## 2018-07-15 MED ORDER — HYDROCODONE-ACETAMINOPHEN 5-325 MG PO TABS: 1.0000 | ORAL_TABLET | ORAL | 0 refills | Status: DC | PRN

## 2018-07-15 MED ORDER — HYDROCODONE-ACETAMINOPHEN 5-325 MG PO TABS
1.0000 | ORAL_TABLET | Freq: Once | ORAL | Status: AC
  Administered 2018-07-15: 22:00:00 1 via ORAL
  Filled 2018-07-15: qty 1

## 2018-07-15 NOTE — Discharge Instructions (Addendum)
Return if any problems.  Return for ultrasound tomorrow.   Drink plenty of fluids.  See your Physician for recheck of renal functions in 1 week   IMPORTANT PATIENT INSTRUCTIONS:  You have been scheduled for an Outpatient Vascular Study at [facility].    If tomorrow is a Saturday, Sunday or holiday, please go to the [facility] Emergency Department Registration Desk at 11 am tomorrow morning and tell them you are there for a vascular study.

## 2018-07-15 NOTE — ED Triage Notes (Signed)
Patient reports intermittent right lower leg pain for 2 days worse when lying and sitting , denies injury /ambulatory . Pedal pulses present .

## 2018-07-15 NOTE — Telephone Encounter (Signed)
Summary: advise    Pt has leg cramping due to diabetes. When Pt lays down or sits down her toes, ankles and feet goes numb and she experiences severe leg pain. Pt tried to wear compression socks but that did not help and the pain started in the left leg and traveled to he right. The Pain prevents Pt from sleeping. / please advise      This past week she has had leg cramping only at night in both legs but especially in the right leg for the most part. The pain starts at her toes and goes to her calf.  No pain when she is up and walking. She has been using magnesium spray on her legs when this starts but lately is not helping. She also had been taking magnesium, she stated was prescribed by Dr. Tamala Julian, sports medicine.  She has taking ibuprofen for the pain. She denies fever, rash, swelling. She has neuropathy and has numbness in her feet.  She is advised to go to an urgent care for assessment. Advised to call back with increase in pain or redness or swelling and fever. She voiced understanding.  Routing to [employer] at [facility].   Reason for Disposition . Leg pain or muscle cramp is a chronic symptom (recurrent or ongoing AND present > 4 weeks)  Answer Assessment - Initial Assessment Questions 1. ONSET: "When did the pain start?"      This past week 2. LOCATION: "Where is the pain located?"      Right leg especially 3. PAIN: "How bad is the pain?"    (Scale 1-10; or mild, moderate, severe)   -  MILD (1-3): doesn't interfere with normal activities    -  MODERATE (4-7): interferes with normal activities (e.g., work or school) or awakens from sleep, limping    -  SEVERE (8-10): excruciating pain, unable to do any normal activities, unable to walk     At night is severe 4. WORK OR EXERCISE: "Has there been any recent work or exercise that involved this part of the body?"      Works in the garden and has not increased her activity 5. CAUSE: "What do you think is causing the leg pain?"     Not  sure 6. OTHER SYMPTOMS: "Do you have any other symptoms?" (e.g., chest pain, back pain, breathing difficulty, swelling, rash, fever, numbness, weakness)     Numbness when she is lying down, weak when the pain sets in 7. PREGNANCY: "Is there any chance you are pregnant?" "When was your last menstrual period?"     n/a  Protocols used: LEG PAIN-A-AH

## 2018-07-15 NOTE — ED Provider Notes (Signed)
[facility] EMERGENCY DEPARTMENT Provider Note   CSN: [id number] Arrival date & time: 07/15/18  1857     History   Chief Complaint Chief Complaint  Patient presents with  . Leg Pain    HPI Tiffany Gibbs is a 73 y.o. female.     The history is provided by the patient. No language interpreter was used.  Leg Pain Location:  Leg Injury: no   Leg location:  R lower leg Pain details:    Quality:  Aching   Radiates to:  Does not radiate   Severity:  Moderate   Timing:  Constant   Progression:  Worsening Chronicity:  New Dislocation: no   Foreign body present:  No foreign bodies Tetanus status:  Up to date Relieved by:  Nothing Worsened by:  Nothing Ineffective treatments:  None tried Pt reports she has had leg cramps for the past 2 days.  Pt reports she has had a severe cramp in right leg that has not resolved.  Pt is worried about possible blood clot.  Pt reports she has had ultrasounds in the past.  No hx of dvt but is concerned because her Mother died from DVT.    Past Medical History:  Diagnosis Date  . Anxiety   . Cataract   . Depression   . DM type 2 (diabetes mellitus, type 2) ([place])   . Fibromyalgia   . H/O dizziness   . Hives    from tomatoes  . HTN (hypertension)   . Hyperlipidemia   . Low back pain   . OSA (obstructive sleep apnea)    uses c-pap  . Osteoarthritis   . Osteoporosis   . SOB (shortness of breath) on exertion     Patient Active Problem List   Diagnosis Date Noted  . 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 Obstructive sleep apnea treated with continuous positive airway pressure (CPAP) 06/04/2017  . Degenerative arthritis of knee, bilateral 05/25/2017  . Left lumbar radiculopathy 04/27/2017  . Right knee pain 04/27/2017  . Tick bite 08/18/2016  . Drusen of macula of both eyes 05/04/2016  . Dry eyes, bilateral 05/04/2016  . Pseudophakia of both eyes 05/04/2016  . PVD (posterior  vitreous detachment), both eyes 05/04/2016  . Dupuytren's disease of palm 04/01/2016  . Degenerative disc disease, lumbar 12/04/2015  . Elevated hemoglobin ([place]) 04/17/2015  . Occipital neuralgia of right side 04/17/2015  . Well adult exam 11/19/2014  . Asthmatic bronchitis 10/15/2014  . Neck pain on right side 10/15/2014  . Acute sinusitis 09/26/2014  . Dyslipidemia 06/18/2014  . Fracture of middle phalanx of finger 05/02/2014  . Hand pain, right 04/19/2014  . DM2 (diabetes mellitus, type 2) ([place]) 04/19/2014  . Hematoma of leg 12/15/2013  . Phlebitis of superficial vein of lower extremity 11/30/2013  . Otitis, externa, infective 11/30/2013  . Onychomycosis 08/30/2013  . Lower leg pain 05/10/2013  . Corn or callus 02/09/2013  . Acute posthemorrhagic anemia 01/19/2013  . Constipation 12/16/2012  . Left hip pain 12/16/2012  . Osteoarthritis of hip Left 11/27/2012  . Rash and nonspecific skin eruption 07/27/2012  . Cough 03/31/2012  . Diarrhea 02/05/2012  . Microhematuria 09/09/2011  . Elevated LFTs 09/09/2011  . B12 deficiency 06/03/2011  . Cellulitis 03/10/2011  . Edema 03/10/2011  . Paresthesia of arm 02/25/2011  . Memory difficulty 11/03/2010  . Fibromyalgia syndrome 07/10/2010  . OSA (obstructive sleep apnea) 07/02/2010  . SIALADENITIS 11/25/2009  . TOBACCO  USE, QUIT 04/04/2009  . ALLERGIC RHINITIS CAUSE UNSPECIFIED 10/04/2008  . URTICARIA 10/04/2008  . URINARY RETENTION 09/27/2008  . HIP PAIN 07/17/2008  . CERVICAL RADICULITIS 07/21/2007  . Headache 07/21/2007  . Osteoporosis 02/16/2007  . LOW BACK PAIN 12/27/2006  . Obesity 08/07/2006  . Anxiety 08/07/2006  . Depression 08/07/2006  . Hypertension, essential 08/07/2006  . OSTEOARTHRITIS 08/07/2006    Past Surgical History:  Procedure Laterality Date  . cervical cryotherapy     cervix  . EYE SURGERY     Cataracts/Bil  . LASIK Bilateral   . TOTAL ABDOMINAL HYSTERECTOMY    . TOTAL HIP ARTHROPLASTY  09/2008    right- [place]/  . TOTAL HIP ARTHROPLASTY Left 11/28/2012   Procedure: TOTAL HIP ARTHROPLASTY;  Surgeon: Kerin Salen, MD;  Location: [facility];  Service: Orthopedics;  Laterality: Left;     OB History   No obstetric history on file.      Home Medications    Prior to Admission medications   Medication Sig Start Date End Date Taking? Authorizing Provider  amoxicillin (AMOXIL) 500 MG capsule as needed. 11/02/15   [provider]  aspirin EC 81 MG tablet Take 81 mg by mouth daily.    [provider]  ciclopirox (PENLAC) 8 % solution APPLY OVER NAIL AND SURROUNDING SKIN DAILY AT BEDTIME. APPLY DAILY OVER PREVIOUS COAT. AFTER 7 DAYS MAY REMOVE WITH ALCOHOL AND CONTINUE CYC 03/08/18   [name], Evie Lacks, MD  citalopram (CELEXA) 40 MG tablet TAKE 1 TABLET DAILY 02/24/18   [name], Evie Lacks, MD  clotrimazole-betamethasone (LOTRISONE) cream Apply 1 application topically 2 (two) times daily. Patient taking differently: Apply 1 application topically as needed.  06/18/14   [name], Evie Lacks, MD  Cyanocobalamin (B-12) 2500 MCG SUBL Place under the tongue.    [provider]  diphenhydrAMINE (BENADRYL) 25 MG tablet Take 25 mg by mouth at bedtime as needed.     [provider]  ezetimibe (ZETIA) 10 MG tablet TAKE ONE TABLET BY MOUTH ONE TIME DAILY  04/04/18   [name], Evie Lacks, MD  fluticasone (FLONASE) 50 MCG/ACT nasal spray Place 1 spray into both nostrils daily as needed for allergies or rhinitis.    [provider]  furosemide (LASIX) 40 MG tablet Take 0.5-1 tablets (20-40 mg total) by mouth daily as needed for edema. 01/04/18 01/04/19  [name], Evie Lacks, MD  HYDROcodone-acetaminophen (NORCO/VICODIN) 5-325 MG tablet Take 1 tablet by mouth every 4 (four) hours as needed. 07/15/18   Fransico Meadow, PA-C  ibuprofen (ADVIL,MOTRIN) 600 MG tablet Take 1 tablet (600 mg total) by mouth 2 (two) times daily as needed. 04/05/18   [name], Evie Lacks, MD   loratadine (CLARITIN) 10 MG tablet Take 10 mg by mouth daily.    [provider]  LORazepam (ATIVAN) 1 MG tablet Take 1 tablet (1 mg total) by mouth 2 (two) times daily as needed for anxiety. 08/12/17   [name], Evie Lacks, MD  metFORMIN (GLUCOPHAGE) 500 MG tablet TAKE 1 TABLET TWICE DAILY  WITH MEALS 03/08/18   [name], Evie Lacks, MD  Omega-3 Fatty Acids (FISH OIL) 1200 MG CAPS Take 2,400 mg by mouth 2 (two) times daily.    [provider]  phentermine (ADIPEX-P) 37.5 MG tablet TAKE ONE TABLET BY MOUTH ONE TIME DAILY  BEFORE BREAKFAST 05/10/18   [name], Evie Lacks, MD  potassium chloride (KLOR-CON) 8 MEQ tablet Take 1 tablet (8 mEq total) by mouth 2 (two) times daily. 06/02/18 05/28/19  [name], Evie Lacks, MD  Vitamin D, Ergocalciferol, (DRISDOL) 1.25 MG (50000 UT) CAPS capsule Take 1 capsule (50,000 Units total) by mouth every 30 (thirty) days. 06/16/18   [name], Evie Lacks, MD    Family History Family History  Problem Relation Age of Onset  . Hypertension Mother   . Diabetes Mother   . Heart disease Father   . Multiple sclerosis Father   . Heart attack Father   . Kidney disease Brother 46       ESRD    Social History Social History   Tobacco Use  . Smoking status: Former Smoker    Packs/day: 2.00    Years: 25.00    Pack years: 50.00    Types: Cigarettes    Quit date: 01/13/1983    Years since quitting: 35.5  . Smokeless tobacco: Never Used  Substance Use Topics  . Alcohol use: No    Alcohol/week: 0.0 standard drinks  . Drug use: No     Allergies   Celecoxib, Enalapril, Losartan, Lovastatin, Naproxen, and Pravachol [pravastatin sodium]   Review of Systems Review of Systems  All other systems reviewed and are negative.    Physical Exam Updated Vital Signs BP 120/69 (BP Location: Left Arm)   Pulse 84   Temp 97.9 F (36.6 C) (Oral)   Resp 16   SpO2 100%   Physical Exam Vitals signs reviewed.  HENT:     Head: Normocephalic.  Eyes:      Pupils: Pupils are equal, round, and reactive to light.  Cardiovascular:     Rate and Rhythm: Normal rate.  Pulmonary:     Effort: Pulmonary effort is normal.  Abdominal:     General: Abdomen is flat.  Musculoskeletal:        General: Tenderness present. No swelling or deformity.     Comments: Tender right cal to light touch,  Pain with homan's.    Skin:    General: Skin is warm.  Neurological:     General: No focal deficit present.  Psychiatric:        Mood and Affect: Mood normal.      ED Treatments / Results  Labs (all labs ordered are listed, but only abnormal results are displayed) Labs Reviewed  BASIC METABOLIC PANEL - Abnormal; Notable for the following components:      Result Value   BUN 26 (*)    Creatinine, Ser 1.13 (*)    GFR calc non Af Amer 48 (*)    GFR calc Af Amer 56 (*)    All other components within normal limits  CBC WITH DIFFERENTIAL/PLATELET  D-DIMER, QUANTITATIVE (NOT AT [facility])  MAGNESIUM    EKG None  Radiology No results found.  Procedures Procedures (including critical care time)  Medications Ordered in ED Medications  HYDROcodone-acetaminophen (NORCO/VICODIN) 5-325 MG per tablet 1 tablet (1 tablet Oral Given 07/15/18 2225)     Initial Impression / Assessment and Plan / ED Course  I have reviewed the triage vital signs and the nursing notes.  Pertinent labs & imaging results that were available during my care of the patient were reviewed by me and considered in my medical decision making (see chart for details).        MDM   Bun and creat are elevated.  Pt counseled on results.  Pt encouraged to drink fluids and have her MD recheck her labs in 1 week. Pt given hydrocodone for pain here.  Rx for hydrocodone.  Pt is scheduled to  return tomorrow for venous ultrasound.   Final Clinical Impressions(s) / ED Diagnoses   Final diagnoses:  Right leg pain    ED Discharge Orders         Ordered    HYDROcodone-acetaminophen  (NORCO/VICODIN) 5-325 MG tablet  Every 4 hours PRN     07/15/18 2246    LE VENOUS     07/15/18 2302        An After Visit Summary was printed and given to the patient.    Fransico Meadow, Hershal Coria 07/15/18 2325    Margette Fast, MD 07/16/18 1256

## 2018-07-16 ENCOUNTER — Ambulatory Visit (HOSPITAL_COMMUNITY)
Admission: RE | Admit: 2018-07-16 | Discharge: 2018-07-16 | Disposition: A | Discharge status: Home / Self Care | Payer: Medicare HMO | Source: Ambulatory Visit | Attending: Emergency Medicine | Admitting: Emergency Medicine

## 2018-07-16 DIAGNOSIS — M79609 Pain in unspecified limb: Secondary | ICD-10-CM | POA: Diagnosis not present

## 2018-07-16 DIAGNOSIS — M79604 Pain in right leg: Secondary | ICD-10-CM | POA: Insufficient documentation

## 2018-07-16 NOTE — Progress Notes (Signed)
RLE venous duplex       has been completed. Preliminary results can be found under CV proc through chart review. Jancie Kercher, BS, RDMS, RVT   

## 2018-07-18 NOTE — Telephone Encounter (Signed)
Pt went to ED

## 2018-07-19 ENCOUNTER — Encounter: Payer: Self-pay | Admitting: Internal Medicine

## 2018-07-19 ENCOUNTER — Other Ambulatory Visit (INDEPENDENT_AMBULATORY_CARE_PROVIDER_SITE_OTHER): Payer: Medicare HMO

## 2018-07-19 ENCOUNTER — Other Ambulatory Visit: Payer: Self-pay

## 2018-07-19 ENCOUNTER — Ambulatory Visit (INDEPENDENT_AMBULATORY_CARE_PROVIDER_SITE_OTHER): Payer: Medicare HMO | Admitting: Internal Medicine

## 2018-07-19 VITALS — BP 112/68 | HR 81 | Temp 97.8°F | Ht 64.0 in | Wt 139.0 lb

## 2018-07-19 DIAGNOSIS — M79669 Pain in unspecified lower leg: Secondary | ICD-10-CM

## 2018-07-19 DIAGNOSIS — N8110 Cystocele, unspecified: Secondary | ICD-10-CM

## 2018-07-19 DIAGNOSIS — E119 Type 2 diabetes mellitus without complications: Secondary | ICD-10-CM

## 2018-07-19 DIAGNOSIS — E538 Deficiency of other specified B group vitamins: Secondary | ICD-10-CM

## 2018-07-19 DIAGNOSIS — E785 Hyperlipidemia, unspecified: Secondary | ICD-10-CM | POA: Diagnosis not present

## 2018-07-19 LAB — BASIC METABOLIC PANEL
BUN: 17 mg/dL (ref 6–23)
CO2: 24 mEq/L (ref 19–32)
Calcium: 9.5 mg/dL (ref 8.4–10.5)
Chloride: 102 mEq/L (ref 96–112)
Creatinine, Ser: 0.92 mg/dL (ref 0.40–1.20)
GFR: 59.83 mL/min — ABNORMAL LOW (ref >60.00)
Glucose, Bld: 86 mg/dL (ref 70–99)
Potassium: 3.8 mEq/L (ref 3.5–5.1)
Sodium: 138 mEq/L (ref 135–145)

## 2018-07-19 LAB — CK: Total CK: 164 U/L (ref 7–177)

## 2018-07-19 LAB — MAGNESIUM: Magnesium: 2 mg/dL (ref 1.5–2.5)

## 2018-07-19 LAB — HEMOGLOBIN A1C: Hgb A1c MFr Bld: 5.5 % (ref 4.6–6.5)

## 2018-07-19 NOTE — Assessment & Plan Note (Signed)
B legs - severe Labs Stop Zetia Hold Furosemide Take Magnesium Take Tylenol PM 2 tablets at nights Epsom salts Magnesium po

## 2018-07-19 NOTE — Patient Instructions (Addendum)
Stop Zetia Hold Furosemide Take Magnesium Take Tylenol PM 2 tablets at night

## 2018-07-19 NOTE — Assessment & Plan Note (Addendum)
New poss related to wt loss s/p remote hysterctomy GYN ref

## 2018-07-19 NOTE — Assessment & Plan Note (Signed)
2020 Coronary calcium score is 0.  D/c Zetia and ASA

## 2018-07-19 NOTE — Assessment & Plan Note (Signed)
On B12 

## 2018-07-19 NOTE — Assessment & Plan Note (Signed)
Zetia - d/c

## 2018-07-19 NOTE — Progress Notes (Signed)
Subjective:  Patient ID: Tiffany Gibbs, female    DOB: [birth year]  Age: 73 y.o. MRN: [id number]  CC: No chief complaint on file.   HPI Tiffany Gibbs presents for severe B leg pain in the calves since 7/2. It started w/her R leg. Went to ER on 7/3. She was screaming w/pain - it was like a cramp, but there was no visible spasm. C/o vaginal prolapse  - new  Outpatient Medications Prior to Visit  Medication Sig Dispense Refill   amoxicillin (AMOXIL) 500 MG capsule as needed.     aspirin EC 81 MG tablet Take 81 mg by mouth daily.     ciclopirox (PENLAC) 8 % solution APPLY OVER NAIL AND SURROUNDING SKIN DAILY AT BEDTIME. APPLY DAILY OVER PREVIOUS COAT. AFTER 7 DAYS MAY REMOVE WITH ALCOHOL AND CONTINUE CYC 6.6 mL 0   clotrimazole-betamethasone (LOTRISONE) cream Apply 1 application topically 2 (two) times daily. (Patient taking differently: Apply 1 application topically as needed. ) 45 g 1   Cyanocobalamin (B-12) 2500 MCG SUBL Place under the tongue.     diphenhydrAMINE (BENADRYL) 25 MG tablet Take 25 mg by mouth at bedtime as needed.      ezetimibe (ZETIA) 10 MG tablet TAKE ONE TABLET BY MOUTH ONE TIME DAILY  90 tablet 2   fluticasone (FLONASE) 50 MCG/ACT nasal spray Place 1 spray into both nostrils daily as needed for allergies or rhinitis.     furosemide (LASIX) 40 MG tablet Take 0.5-1 tablets (20-40 mg total) by mouth daily as needed for edema. 90 tablet 3   HYDROcodone-acetaminophen (NORCO/VICODIN) 5-325 MG tablet Take 1 tablet by mouth every 4 (four) hours as needed. 10 tablet 0   ibuprofen (ADVIL,MOTRIN) 600 MG tablet Take 1 tablet (600 mg total) by mouth 2 (two) times daily as needed. 60 tablet 2   loratadine (CLARITIN) 10 MG tablet Take 10 mg by mouth daily.     LORazepam (ATIVAN) 1 MG tablet Take 1 tablet (1 mg total) by mouth 2 (two) times daily as needed for anxiety. 180 tablet 1   metFORMIN (GLUCOPHAGE) 500 MG tablet TAKE 1 TABLET TWICE DAILY  WITH MEALS 180 tablet 3     Omega-3 Fatty Acids (FISH OIL) 1200 MG CAPS Take 2,400 mg by mouth 2 (two) times daily.     phentermine (ADIPEX-P) 37.5 MG tablet TAKE ONE TABLET BY MOUTH ONE TIME DAILY  BEFORE BREAKFAST 30 tablet 2   potassium chloride (KLOR-CON) 8 MEQ tablet Take 1 tablet (8 mEq total) by mouth 2 (two) times daily. 180 tablet 3   Vitamin D, Ergocalciferol, (DRISDOL) 1.25 MG (50000 UT) CAPS capsule Take 1 capsule (50,000 Units total) by mouth every 30 (thirty) days. 3 capsule 4   citalopram (CELEXA) 40 MG tablet TAKE 1 TABLET DAILY 90 tablet 1   Facility-Administered Medications Prior to Visit  Medication Dose Route Frequency Provider Last Rate Last Dose   0.9 %  sodium chloride infusion  500 mL Intravenous Continuous Pyrtle, Lajuan Lines, MD       0.9 %  sodium chloride infusion  500 mL Intravenous Continuous Pyrtle, Lajuan Lines, MD        ROS: Review of Systems  Constitutional: Negative for activity change, appetite change, chills, fatigue and unexpected weight change.  HENT: Negative for congestion, mouth sores and sinus pressure.   Eyes: Negative for visual disturbance.  Respiratory: Negative for cough and chest tightness.   Gastrointestinal: Negative for abdominal pain and nausea.  Genitourinary: Negative for  difficulty urinating, frequency and vaginal pain.  Musculoskeletal: Positive for myalgias. Negative for arthralgias, back pain and gait problem.  Skin: Negative for pallor and rash.  Neurological: Negative for dizziness, tremors, weakness, numbness and headaches.  Psychiatric/Behavioral: Negative for confusion and sleep disturbance.    Objective:  There were no vitals taken for this visit.  BP Readings from Last 3 Encounters:  07/15/18 120/69  05/12/18 109/68  02/03/18 126/74    Wt Readings from Last 3 Encounters:  02/03/18 157 lb (71.2 kg)  01/04/18 160 lb (72.6 kg)  08/12/17 187 lb (84.8 kg)    Physical Exam Constitutional:      General: She is not in acute distress.     Appearance: She is well-developed.  HENT:     Head: Normocephalic.     Right Ear: External ear normal.     Left Ear: External ear normal.     Nose: Nose normal.  Eyes:     General:        Right eye: No discharge.        Left eye: No discharge.     Conjunctiva/sclera: Conjunctivae normal.     Pupils: Pupils are equal, round, and reactive to light.  Neck:     Musculoskeletal: Normal range of motion and neck supple.     Thyroid: No thyromegaly.     Vascular: No JVD.     Trachea: No tracheal deviation.  Cardiovascular:     Rate and Rhythm: Normal rate and regular rhythm.     Heart sounds: Normal heart sounds.  Pulmonary:     Effort: No respiratory distress.     Breath sounds: No stridor. No wheezing.  Abdominal:     General: Bowel sounds are normal. There is no distension.     Palpations: Abdomen is soft. There is no mass.     Tenderness: There is no abdominal tenderness. There is no guarding or rebound.  Musculoskeletal:        General: No tenderness.  Lymphadenopathy:     Cervical: No cervical adenopathy.  Skin:    Findings: No erythema or rash.  Neurological:     Cranial Nerves: No cranial nerve deficit.     Motor: No abnormal muscle tone.     Coordination: Coordination normal.     Deep Tendon Reflexes: Reflexes normal.  Psychiatric:        Behavior: Behavior normal.        Thought Content: Thought content normal.        Judgment: Judgment normal.    no edema, B calves NT, good pedal pulses B  Lab Results  Component Value Date   WBC 6.7 07/15/2018   HGB 13.7 07/15/2018   HCT 39.8 07/15/2018   PLT 309 07/15/2018   GLUCOSE 94 07/15/2018   CHOL 163 12/23/2017   TRIG 80.0 12/23/2017   HDL 52.70 12/23/2017   LDLDIRECT 148.2 02/03/2013   LDLCALC 95 12/23/2017   ALT 10 12/23/2017   AST 13 12/23/2017   NA 136 07/15/2018   K 3.9 07/15/2018   CL 98 07/15/2018   CREATININE 1.13 (H) 07/15/2018   BUN 26 (H) 07/15/2018   CO2 24 07/15/2018   TSH 1.11 12/23/2017   INR  1.14 11/21/2012   HGBA1C 5.6 04/27/2018   MICROALBUR 0.9 12/23/2017    [facility] Ven[facility]  Result Date: 07/16/2018  Lower Venous Study Indications: Leg cramps.  Comparison Study: 05/19/13 negative Performing Technologist: June Leap RDMS, RVT  Examination Guidelines: A complete evaluation includes B-mode  imaging, spectral Doppler, color Doppler, and power Doppler as needed of all accessible portions of each vessel. Bilateral testing is considered an integral part of a complete examination. Limited examinations for reoccurring indications may be performed as noted.  +---------+---------------+---------+-----------+----------+-------+  RIGHT     Compressibility Phasicity Spontaneity Properties Summary  +---------+---------------+---------+-----------+----------+-------+  CFV       Full            Yes       Yes                             +---------+---------------+---------+-----------+----------+-------+  SFJ       Full                                                      +---------+---------------+---------+-----------+----------+-------+  FV Prox   Full                                                      +---------+---------------+---------+-----------+----------+-------+  FV Mid    Full                                                      +---------+---------------+---------+-----------+----------+-------+  FV Distal Full                                                      +---------+---------------+---------+-----------+----------+-------+  PFV       Full                                                      +---------+---------------+---------+-----------+----------+-------+  POP       Full            Yes       Yes                             +---------+---------------+---------+-----------+----------+-------+  PTV       Full                                                      +---------+---------------+---------+-----------+----------+-------+  PERO      Full                                                       +---------+---------------+---------+-----------+----------+-------+  Summary: Right: There is no evidence of deep vein thrombosis in the lower extremity. No cystic structure found in the popliteal fossa.  *See table(s) above for measurements and observations. Electronically signed by Servando Snare MD on 07/16/2018 at 12:34:45 PM.    Final     Assessment & Plan:   There are no diagnoses linked to this encounter.   No orders of the defined types were placed in this encounter.    Follow-up: No follow-ups on file.  Walker Kehr, MD

## 2018-07-26 ENCOUNTER — Other Ambulatory Visit: Payer: Self-pay

## 2018-07-26 ENCOUNTER — Ambulatory Visit (INDEPENDENT_AMBULATORY_CARE_PROVIDER_SITE_OTHER): Payer: Medicare HMO | Admitting: Obstetrics and Gynecology

## 2018-07-26 ENCOUNTER — Encounter: Payer: Self-pay | Admitting: Obstetrics and Gynecology

## 2018-07-26 VITALS — BP 104/66 | HR 88 | Temp 98.1°F | Ht 62.6 in | Wt 140.6 lb

## 2018-07-26 DIAGNOSIS — R39198 Other difficulties with micturition: Secondary | ICD-10-CM | POA: Diagnosis not present

## 2018-07-26 DIAGNOSIS — N8111 Cystocele, midline: Secondary | ICD-10-CM

## 2018-07-26 DIAGNOSIS — N3941 Urge incontinence: Secondary | ICD-10-CM

## 2018-07-26 NOTE — Progress Notes (Signed)
73 y.o. G1P1 Married White or Caucasian Not Hispanic or Latino female referred here by Dr Alain Marion for genital prolapse. H/O TAH/BSO years ago.  She notices a vaginal bulge, she noticed it when she took a shower a couple of weeks ago. She isn't aware of it unless she feels with her fingers. She has a slow stream when she voids, some urgency to void and can leak on the way to the bathroom.  She leaks a small amount of urine on the way to the bathroom ~1 x a day, tolerable. She drinks one cup of coffee a day. No stress incontinence. She doesn't know if she empties her bladder, sits for a while on the toilet and pushes on her lower stomach sometimes. No bowel c/o, has a BM every 1-2 days, sometimes 2 x a day. Not hard, doesn't strain.   Not sexually active.   H/O DM, HgbA1C was 5.5 this month.     No LMP recorded. Patient has had a hysterectomy.          Sexually active: No.  The current method of family planning is status post hysterectomy.    Exercising: Yes.    walking, gardening Smoker:  no  Health Maintenance: Pap:  Unsure History of abnormal Pap:  no MMG:  10/13/2017 Birads 1 negative BMD:   09/25/2015 mild bone loss Colonoscopy: 09/09/2015 WNL TDaP:  02/25/2011 Gardasil: N/A   reports that she quit smoking about 35 years ago. Her smoking use included cigarettes. She has a 50.00 pack-year smoking history. She has never used smokeless tobacco. She reports that she does not drink alcohol or use drugs. Retired, one child, no grandchildren.   Past Medical History:  Diagnosis Date  . Anxiety   . Cataract   . Depression   . DM type 2 (diabetes mellitus, type 2) ([place])   . Fibromyalgia   . H/O dizziness   . Hives    from tomatoes  . HTN (hypertension)   . Hyperlipidemia   . Low back pain   . OSA (obstructive sleep apnea)    uses c-pap  . Osteoarthritis   . Osteoporosis   . SOB (shortness of breath) on exertion     Past Surgical History:  Procedure Laterality Date  . cervical  cryotherapy     cervix  . EYE SURGERY     Cataracts/Bil  . LASIK Bilateral   . TOTAL ABDOMINAL HYSTERECTOMY    . TOTAL HIP ARTHROPLASTY  09/2008   right- [place]/  . TOTAL HIP ARTHROPLASTY Left 11/28/2012   Procedure: TOTAL HIP ARTHROPLASTY;  Surgeon: Kerin Salen, MD;  Location: [facility];  Service: Orthopedics;  Laterality: Left;    Current Outpatient Medications  Medication Sig Dispense Refill  . Cyanocobalamin (B-12) 2500 MCG SUBL Place under the tongue.    . fluticasone (FLONASE) 50 MCG/ACT nasal spray Place 1 spray into both nostrils daily as needed for allergies or rhinitis.    [name] loratadine (CLARITIN) 10 MG tablet Take 10 mg by mouth daily.    . metFORMIN (GLUCOPHAGE) 500 MG tablet TAKE 1 TABLET TWICE DAILY  WITH MEALS 180 tablet 3  . Omega-3 Fatty Acids (FISH OIL) 1200 MG CAPS Take 2,400 mg by mouth 2 (two) times daily.    . potassium chloride (KLOR-CON) 8 MEQ tablet Take 1 tablet (8 mEq total) by mouth 2 (two) times daily. 180 tablet 3  . Vitamin D, Ergocalciferol, (DRISDOL) 1.25 MG (50000 UT) CAPS capsule Take 1 capsule (50,000 Units total) by  mouth every 30 (thirty) days. 3 capsule 4   Current Facility-Administered Medications  Medication Dose Route Frequency Provider Last Rate Last Dose  . 0.9 %  sodium chloride infusion  500 mL Intravenous Continuous Pyrtle, Lajuan Lines, MD      . 0.9 %  sodium chloride infusion  500 mL Intravenous Continuous Pyrtle, Lajuan Lines, MD        Family History  Problem Relation Age of Onset  . Hypertension Mother   . Diabetes Mother   . Heart disease Father   . Multiple sclerosis Father   . Heart attack Father   . Kidney disease Brother 68       ESRD    Review of Systems  Constitutional: Negative.   HENT: Negative.   Eyes: Negative.   Respiratory: Negative.   Cardiovascular: Negative.   Gastrointestinal: Negative.   Endocrine: Negative.   Genitourinary: Negative.   Musculoskeletal: Negative.   Skin: Negative.   Allergic/Immunologic: Negative.    Neurological: Negative.   Psychiatric/Behavioral: Negative.     Exam:   BP 104/66 (BP Location: Right Arm, Patient Position: Sitting, Cuff Size: Normal)   Pulse 88   Temp 98.1 F (36.7 C) (Skin)   Ht 5' 2.6" (1.59 m)   Wt 140 lb 9.6 oz (63.8 kg)   BMI 25.23 kg/m   Weight change: @WEIGHTCHANGE @ Height:   Height: 5' 2.6" (159 cm)  Ht Readings from Last 3 Encounters:  07/26/18 5' 2.6" (1.59 m)  07/19/18 5\' 4"  (1.626 m)  02/03/18 5\' 4"  (1.626 m)    General appearance: alert, cooperative and appears stated age Head: Normocephalic, without obvious abnormality, atraumatic Neck: no adenopathy, supple, symmetrical, trachea midline and thyroid normal to inspection and palpation Lungs: clear to auscultation bilaterally Cardiovascular: regular rate and rhythm Abdomen: soft, non-tender; non distended,  no masses,  no organomegaly Extremities: extremities normal, atraumatic, no cyanosis or edema Skin: Skin color, texture, turgor normal. No rashes or lesions Lymph nodes: Cervical, supraclavicular, and axillary nodes normal. No abnormal inguinal nodes palpated Neurologic: Grossly normal   Pelvic: External genitalia:  no lesions              Urethra:  normal appearing urethra with no masses, tenderness or lesions              Bartholins and Skenes: normal                 Vagina: atrophic appearing vagina with a small to moderate grade 2 cystocele. Most prominent with standing and valsalva. Grade one vault prolapse, no significant rectocele              Cervix: absent               Bimanual Exam:  Uterus:  uterus absent              Adnexa: no mass, fullness, tenderness               Rectovaginal: Confirms               Anus:  normal sphincter tone, no lesions  St cath ua: PVR 30 cc  Chaperone was present for exam.  A:  Midline cystocele, grade 2, she only notices it when she washes or goes to the bathroom  Minimal vault prolapse  Slow urinary stream, but emptying well, normal  PVR  Urge incontinence, mild and tolerable  P:   Discussed prolapse, information given  Discussed reducing the prolapse if needed to  void  Discussed the option of a pessary if her symptoms worsen  I wouldn't recommend surgery for her at this time  Discussed kegel exercises  Discussed urge incontinence  Recommended she have minimal caffeine intake  Could consider medication for urge incontinence (may cause retention)  CC: Dr Alain Marion   ~30 minutes face to face time, over 50% in counseling

## 2018-07-26 NOTE — Patient Instructions (Signed)
Kegel Exercises  Kegel exercises can help strengthen your pelvic floor muscles. The pelvic floor is a group of muscles that support your rectum, small intestine, and bladder. In females, pelvic floor muscles also help support the womb (uterus). These muscles help you control the flow of urine and stool. Kegel exercises are painless and simple, and they do not require any equipment. Your provider may suggest Kegel exercises to:  Improve bladder and bowel control.  Improve sexual response.  Improve weak pelvic floor muscles after surgery to remove the uterus (hysterectomy) or pregnancy (females).  Improve weak pelvic floor muscles after prostate gland removal or surgery (males). Kegel exercises involve squeezing your pelvic floor muscles, which are the same muscles you squeeze when you try to stop the flow of urine or keep from passing gas. The exercises can be done while sitting, standing, or lying down, but it is best to vary your position. Exercises How to do Kegel exercises: 1. Squeeze your pelvic floor muscles tight. You should feel a tight lift in your rectal area. If you are a female, you should also feel a tightness in your vaginal area. Keep your stomach, buttocks, and legs relaxed. 2. Hold the muscles tight for up to 10 seconds. 3. Breathe normally. 4. Relax your muscles. 5. Repeat as told by your health care provider. Repeat this exercise daily as told by your health care provider. Continue to do this exercise for at least 4-6 weeks, or for as long as told by your health care provider. You may be referred to a physical therapist who can help you learn more about how to do Kegel exercises. Depending on your condition, your health care provider may recommend:  Varying how long you squeeze your muscles.  Doing several sets of exercises every day.  Doing exercises for several weeks.  Making Kegel exercises a part of your regular exercise routine. This information is not intended  to replace advice given to you by your health care provider. Make sure you discuss any questions you have with your health care provider. Document Released: 12/16/2011 Document Revised: 08/18/2017 Document Reviewed: 08/18/2017 Elsevier Patient Education  2020 [employer]  The pelvic organs, including the bladder, are normally supported by pelvic floor muscles and ligaments.  When these muscles and ligaments are stretched, weakened or torn, the wall between the bladder and the vagina sags or herniates causing a prolapse, sometimes called a cystocele.  This condition may cause discomfort and problems with emptying the bladder.  It can be present in various stages.  Some people are not aware of the changes.  Others may notice changes at the vaginal opening or a feeling of the bladder dropping outside the body.  Causes of a Cystocele  A cystocele is usually caused by muscle straining or stretching during childbirth.  In addition, cystocele is more common after menopause, because the hormone estrogen helps keep the elastic tissues around the pelvic organs strong.  A cystocele is more likely to occur when levels of estrogen decrease.  Other causes include: heavy lifting, chronic coughing, previous pelvic surgery and obesity.  Symptoms  A bladder that has dropped from its normal position may cause: unwanted urine leakage (stress incontinence), frequent urination or urge to urinate, incomplete emptying of the bladder (not feeling bladder relief after emptying), pain or discomfort in the vagina, pelvis, groin, lower back or lower abdomen and frequent urinary tract infections.  Mild cases may not cause any symptoms.  Treatment Options  Pelvic  floor (Kegel) exercises:  Strength training the muscles in your genital area  Behavioral changes: Treating and preventing constipation, taking time to empty your bladder properly, learning to lift properly and/or avoid heavy lifting  when possible, stopping smoking, avoiding weight gain and treating a chronic cough or bronchitis.  A pessary: A vaginal support device is sometimes used to help pelvic support caused by muscle and ligament changes.  Surgery: Surgical repair may be necessary if symptoms cannot be managed with exercise, behavioral changes and a pessary.  Surgery is usually considered for severe cases.   2007, [employer]Urinary Incontinence  Urinary incontinence refers to a condition in which a person is unable to control where and when to pass urine. A person with this condition will urinate when he or she does not mean to (involuntarily). What are the causes? This condition may be caused by:  Medicines.  Infections.  Constipation.  Overactive bladder muscles.  Weak bladder muscles.  Weak pelvic floor muscles. These muscles provide support for the bladder, intestine, and, in women, the uterus.  Enlarged prostate in men. The prostate is a gland near the bladder. When it gets too big, it can pinch the urethra. With the urethra blocked, the bladder can weaken and lose the ability to empty properly.  Surgery.  Emotional factors, such as anxiety, stress, or post-traumatic stress disorder (PTSD).  Pelvic organ prolapse. This happens in women when organs shift out of place and into the vagina. This shift can prevent the bladder and urethra from working properly. What increases the risk? The following factors may make you more likely to develop this condition:  Older age.  Obesity and physical inactivity.  Pregnancy and childbirth.  Menopause.  Diseases that affect the nerves or spinal cord (neurological diseases).  Long-term (chronic) coughing. This can increase pressure on the bladder and pelvic floor muscles. What are the signs or symptoms? Symptoms may vary depending on the type of urinary incontinence you have. They include:  A sudden urge to urinate, but passing urine  involuntarily before you can get to a bathroom (urge incontinence).  Suddenly passing urine with any activity that forces urine to pass, such as coughing, laughing, exercise, or sneezing (stress incontinence).  Needing to urinate often, but urinating only a small amount, or constantly dribbling urine (overflow incontinence).  Urinating because you cannot get to the bathroom in time due to a physical disability, such as arthritis or injury, or communication and thinking problems, such as Alzheimer disease (functional incontinence). How is this diagnosed? This condition may be diagnosed based on:  Your medical history.  A physical exam.  Tests, such as: ? Urine tests. ? X-rays of your kidney and bladder. ? Ultrasound. ? CT scan. ? Cystoscopy. In this procedure, a health care provider inserts a tube with a light and camera (cystoscope) through the urethra and into the bladder in order to check for problems. ? Urodynamic testing. These tests assess how well the bladder, urethra, and sphincter can store and release urine. There are different types of urodynamic tests, and they vary depending on what the test is measuring. To help diagnose your condition, your health care provider may recommend that you keep a log of when you urinate and how much you urinate. How is this treated? Treatment for this condition depends on the type of incontinence that you have and its cause. Treatment may include:  Lifestyle changes, such as: ? Quitting smoking. ? Maintaining a healthy weight. ? Staying active. Try to get 150  minutes of moderate-intensity exercise every week. Ask your health care provider which activities are safe for you. ? Eating a healthy diet.  Avoid high-fat foods, like fried foods.  Avoid refined carbohydrates like white bread and white rice.  Limit how much alcohol and caffeine you drink.  Increase your fiber intake. Foods such as fresh fruits, vegetables, beans, and whole grains  are healthy sources of fiber.  Pelvic floor muscle exercises.  Bladder training, such as lengthening the amount of time between bathroom breaks, or using the bathroom at regular intervals.  Using techniques to suppress bladder urges. This can include distraction techniques or controlled breathing exercises.  Medicines to relax the bladder muscles and prevent bladder spasms.  Medicines to help slow or prevent the growth of a man's prostate.  Botox injections. These can help relax the bladder muscles.  Using pulses of electricity to help change bladder reflexes (electrical nerve stimulation).  For women, using a medical device to prevent urine leaks. This is a small, tampon-like, disposable device that is inserted into the urethra.  Injecting collagen or carbon beads (bulking agents) into the urinary sphincter. These can help thicken tissue and close the bladder opening.  Surgery. Follow these instructions at home: Lifestyle  Limit alcohol and caffeine. These can fill your bladder quickly and irritate it.  Keep yourself clean to help prevent odors and skin damage. Ask your doctor about special skin creams and cleansers that can protect the skin from urine.  Consider wearing pads or adult diapers. Make sure to change them regularly, and always change them right after experiencing incontinence. General instructions  Take over-the-counter and prescription medicines only as told by your health care provider.  Use the bathroom about every 3-4 hours, even if you do not feel the need to urinate. Try to empty your bladder completely every time. After urinating, wait a minute. Then try to urinate again.  Make sure you are in a relaxed position while urinating.  If your incontinence is caused by nerve problems, keep a log of the medicines you take and the times you go to the bathroom.  Keep all follow-up visits as told by your health care provider. This is important. Contact a health care  provider if:  You have pain that gets worse.  Your incontinence gets worse. Get help right away if:  You have a fever or chills.  You are unable to urinate.  You have redness in your groin area or down your legs. Summary  Urinary incontinence refers to a condition in which a person is unable to control where and when to pass urine.  This condition may be caused by medicines, infection, weak bladder muscles, weak pelvic floor muscles, enlargement of the prostate (in men), or surgery.  The following factors increase your risk for developing this condition: older age, obesity, pregnancy and childbirth, menopause, neurological diseases, and chronic coughing.  There are several types of urinary incontinence. They include urge incontinence, stress incontinence, overflow incontinence, and functional incontinence.  This condition is usually treated first with lifestyle and behavioral changes, such as quitting smoking, eating a healthier diet, and doing regular pelvic floor exercises. Other treatment options include medicines, bulking agents, medical devices, electrical nerve stimulation, or surgery. This information is not intended to replace advice given to you by your health care provider. Make sure you discuss any questions you have with your health care provider. Document Released: 02/06/2004 Document Revised: 01/08/2017 Document Reviewed: 04/09/2016 Elsevier Patient Education  2020 [employer].

## 2018-07-27 LAB — URINALYSIS, MICROSCOPIC ONLY
Bacteria, UA: NONE SEEN
Casts: NONE SEEN /lpf
WBC, UA: NONE SEEN /hpf (ref 0–5)

## 2018-07-28 ENCOUNTER — Telehealth: Payer: Self-pay | Admitting: Obstetrics and Gynecology

## 2018-07-28 ENCOUNTER — Encounter: Payer: Self-pay | Admitting: Obstetrics and Gynecology

## 2018-07-28 LAB — URINE CULTURE: Organism ID, Bacteria: NO GROWTH

## 2018-07-28 NOTE — Telephone Encounter (Signed)
Spoke with patient. Patient request to proceed with pessary discussed at 07/26/18 OV. Denies any urinary symptoms, is able to void without difficulty and have bowel movements. Denies vag d/c or odor.   OV scheduled for 08/02/18 at 4pm with Dr. Talbert Nan. Patient agreeable to date and time.  Dr. Talbert Nan -ok to proceed as scheduled?

## 2018-07-28 NOTE — Telephone Encounter (Signed)
Patient sent the following message through [facility]. Routing to triage to assist patient with request.  Dear Dr.Jertson,  It's Tiffany Gibbs... Unfortunately for me, my bladder has slip down again. I was so careful not to do anything strenuous since our office visit. However, after my shower Wednesday night, I checked myself to see if my bladder was where it should be and it wasn't. It had fallen down again but this time not outside of my vagina. I'm so disappointed because I really made a special effort to take it easy and my husband has been so attentive to my every need. Which was kind of nice.    I fear an operation because I have little to no tolerance for any pain. However, what path you choose for me, I will go along with.  Anxiously waiting to hear from you. Cell# [phone]  Tiffany Gibbs

## 2018-07-28 NOTE — Telephone Encounter (Signed)
Yes

## 2018-07-29 NOTE — Telephone Encounter (Signed)
Encounter closed

## 2018-08-02 ENCOUNTER — Encounter: Payer: Self-pay | Admitting: Obstetrics and Gynecology

## 2018-08-02 ENCOUNTER — Other Ambulatory Visit: Payer: Self-pay

## 2018-08-02 ENCOUNTER — Ambulatory Visit (INDEPENDENT_AMBULATORY_CARE_PROVIDER_SITE_OTHER): Payer: Medicare HMO | Admitting: Obstetrics and Gynecology

## 2018-08-02 VITALS — BP 198/64 | HR 64 | Temp 98.3°F | Wt 141.6 lb

## 2018-08-02 DIAGNOSIS — N8111 Cystocele, midline: Secondary | ICD-10-CM

## 2018-08-02 DIAGNOSIS — N3941 Urge incontinence: Secondary | ICD-10-CM

## 2018-08-02 NOTE — Progress Notes (Signed)
GYNECOLOGY  VISIT   HPI: 73 y.o.   Married White or Caucasian Not Hispanic or Latino  female   G1P1001 with No LMP recorded. Patient has had a hysterectomy.   here for pessary fitting for a grade 2 cystocele.  States her prolapse is bothering her, she is noticing it most days.  She has mild and tolerable urge incontinence. Slow urinary stream, but normal PVR last week. Urinalysis and culture from last week are normal.   GYNECOLOGIC HISTORY: No LMP recorded. Patient has had a hysterectomy. Contraception: Hysterectomy Menopausal hormone therapy: None        OB History    Gravida  1   Para  1   Term  1   Preterm      AB      Living  1     SAB      TAB      Ectopic      Multiple      Live Births  1              Patient Active Problem List   Diagnosis Date Noted  . Vaginal prolapse 07/19/2018  . 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 Obstructive sleep apnea treated with continuous positive airway pressure (CPAP) 06/04/2017  . Degenerative arthritis of knee, bilateral 05/25/2017  . Left lumbar radiculopathy 04/27/2017  . Right knee pain 04/27/2017  . Tick bite 08/18/2016  . Drusen of macula of both eyes 05/04/2016  . Dry eyes, bilateral 05/04/2016  . Pseudophakia of both eyes 05/04/2016  . PVD (posterior vitreous detachment), both eyes 05/04/2016  . Dupuytren's disease of palm 04/01/2016  . Degenerative disc disease, lumbar 12/04/2015  . Elevated hemoglobin ([place]) 04/17/2015  . Occipital neuralgia of right side 04/17/2015  . Well adult exam 11/19/2014  . Asthmatic bronchitis 10/15/2014  . Neck pain on right side 10/15/2014  . Acute sinusitis 09/26/2014  . Dyslipidemia 06/18/2014  . Fracture of middle phalanx of finger 05/02/2014  . Hand pain, right 04/19/2014  . DM2 (diabetes mellitus, type 2) ([place]) 04/19/2014  . Hematoma of leg 12/15/2013  . Phlebitis of superficial vein of lower extremity 11/30/2013  . Otitis, externa,  infective 11/30/2013  . Onychomycosis 08/30/2013  . Lower leg pain 05/10/2013  . Corn or callus 02/09/2013  . Acute posthemorrhagic anemia 01/19/2013  . Constipation 12/16/2012  . Left hip pain 12/16/2012  . Osteoarthritis of hip Left 11/27/2012  . Rash and nonspecific skin eruption 07/27/2012  . Cough 03/31/2012  . Diarrhea 02/05/2012  . Microhematuria 09/09/2011  . Elevated LFTs 09/09/2011  . B12 deficiency 06/03/2011  . Cellulitis 03/10/2011  . Edema 03/10/2011  . Paresthesia of arm 02/25/2011  . Memory difficulty 11/03/2010  . Fibromyalgia syndrome 07/10/2010  . OSA (obstructive sleep apnea) 07/02/2010  . SIALADENITIS 11/25/2009  . TOBACCO USE, QUIT 04/04/2009  . ALLERGIC RHINITIS CAUSE UNSPECIFIED 10/04/2008  . URTICARIA 10/04/2008  . URINARY RETENTION 09/27/2008  . HIP PAIN 07/17/2008  . CERVICAL RADICULITIS 07/21/2007  . Headache 07/21/2007  . Osteoporosis 02/16/2007  . LOW BACK PAIN 12/27/2006  . Obesity 08/07/2006  . Anxiety 08/07/2006  . Depression 08/07/2006  . Hypertension, essential 08/07/2006  . OSTEOARTHRITIS 08/07/2006    Past Medical History:  Diagnosis Date  . Anxiety   . Cataract   . Depression   . DM type 2 (diabetes mellitus, type 2) ([place])   . Fibromyalgia   . H/O dizziness   . Hives  from tomatoes  . HTN (hypertension)   . Hyperlipidemia   . Low back pain   . OSA (obstructive sleep apnea)    uses c-pap  . Osteoarthritis   . Osteoporosis   . SOB (shortness of breath) on exertion     Past Surgical History:  Procedure Laterality Date  . cervical cryotherapy     cervix  . EYE SURGERY     Cataracts/Bil  . LASIK Bilateral   . TOTAL ABDOMINAL HYSTERECTOMY    . TOTAL HIP ARTHROPLASTY  09/2008   right- [place]/  . TOTAL HIP ARTHROPLASTY Left 11/28/2012   Procedure: TOTAL HIP ARTHROPLASTY;  Surgeon: Kerin Salen, MD;  Location: [facility];  Service: Orthopedics;  Laterality: Left;    Current Outpatient Medications  Medication Sig Dispense  Refill  . Cyanocobalamin (B-12) 2500 MCG SUBL Place under the tongue.    . diphenhydrAMINE HCl (BENADRYL ALLERGY PO) Take by mouth as needed.    . fluticasone (FLONASE) 50 MCG/ACT nasal spray Place 1 spray into both nostrils daily as needed for allergies or rhinitis.    . metFORMIN (GLUCOPHAGE) 500 MG tablet TAKE 1 TABLET TWICE DAILY  WITH MEALS 180 tablet 3  . Omega-3 Fatty Acids (FISH OIL) 1200 MG CAPS Take 2,400 mg by mouth 2 (two) times daily.    . potassium chloride (KLOR-CON) 8 MEQ tablet Take 1 tablet (8 mEq total) by mouth 2 (two) times daily. 180 tablet 3  . Vitamin D, Ergocalciferol, (DRISDOL) 1.25 MG (50000 UT) CAPS capsule Take 1 capsule (50,000 Units total) by mouth every 30 (thirty) days. 3 capsule 4   Current Facility-Administered Medications  Medication Dose Route Frequency Provider Last Rate Last Dose  . 0.9 %  sodium chloride infusion  500 mL Intravenous Continuous Pyrtle, Lajuan Lines, MD      . 0.9 %  sodium chloride infusion  500 mL Intravenous Continuous Pyrtle, Lajuan Lines, MD         ALLERGIES: Celecoxib, Enalapril, Losartan, Lovastatin, Naproxen, Pravachol [pravastatin sodium], and Zetia [ezetimibe]  Family History  Problem Relation Age of Onset  . Hypertension Mother   . Diabetes Mother   . Heart disease Father   . Multiple sclerosis Father   . Heart attack Father   . Kidney disease Brother 42       ESRD    Social History   Socioeconomic History  . Marital status: Married    Spouse name: Sam  . Number of children: 1  . Years of education: 85   . Highest education level: Not on file  Occupational History  . Occupation: N/A  Social Needs  . Financial resource strain: Not on file  . Food insecurity    Worry: Not on file    Inability: Not on file  . Transportation needs    Medical: Not on file    Non-medical: Not on file  Tobacco Use  . Smoking status: Former Smoker    Packs/day: 2.00    Years: 25.00    Pack years: 50.00    Types: Cigarettes    Quit date:  01/13/1983    Years since quitting: 35.5  . Smokeless tobacco: Never Used  Substance and Sexual Activity  . Alcohol use: No    Alcohol/week: 0.0 standard drinks  . Drug use: No  . Sexual activity: Not Currently    Birth control/protection: Post-menopausal, Surgical  Lifestyle  . Physical activity    Days per week: Not on file    Minutes per session:  Not on file  . Stress: Not on file  Relationships  . Social Herbalist on phone: Not on file    Gets together: Not on file    Attends religious service: Not on file    Active member of club or organization: Not on file    Attends meetings of clubs or organizations: Not on file    Relationship status: Not on file  . Intimate partner violence    Fear of current or ex partner: Not on file    Emotionally abused: Not on file    Physically abused: Not on file    Forced sexual activity: Not on file  Other Topics Concern  . Not on file  Social History Narrative   Was divorced - newly re-married as of 2009, Sam   1 son      Caffeine use- coffee 1 cup daily     Review of Systems  Constitutional: Negative.   HENT: Negative.   Eyes: Negative.   Respiratory: Negative.   Cardiovascular: Negative.   Gastrointestinal: Negative.   Genitourinary: Negative.   Musculoskeletal: Negative.   Skin: Negative.   Neurological: Negative.   Endo/Heme/Allergies: Negative.   Psychiatric/Behavioral: Negative.     PHYSICAL EXAMINATION:    BP (!) 198/64 (BP Location: Right Arm, Patient Position: Sitting, Cuff Size: Normal)   Pulse 64   Temp 98.3 F (36.8 C) (Skin)   Wt 141 lb 9.6 oz (64.2 kg)   BMI 25.41 kg/m     General appearance: alert, cooperative and appears stated age  Pelvic: External genitalia:  no lesions              Urethra:  normal appearing urethra with no masses, tenderness or lesions              Bartholins and Skenes: normal                 Vagina: fitted with a #4 ring pessary with support, too big. Fitted with a #3  ring pessary with support, felt comfortable, but started coming out with strong valsalva. Fitted with a #2 cube pessary, felt comfortable in the office. #2 cube pessary removed (slight spotting with removal)  Chaperone was present for exam.  ASSESSMENT Grade 2 cystocele and grade 1 vault prolapse Mild urge incontinence.     PLAN Patient desired a pessary, fitted with a #2 cube pessary. I discussed with the patient again that she only needs to treat the prolapse if her symptoms are bothering her. On further questioning she states she only notices the prolapse sometimes in the shower or when she wipes (when she touches with her fingers or looks with a mirror). She isn't noticing it when she is walking or sitting. I reassured her that her bladder can't become detached from her body. I recommended that she not use a pessary unless her symptoms are bothersome or if she is having difficulty voiding. Recommended that she avoid heavy lifting and straining Kegel information given  Will not use the pessary at this time I reassured her that she did not need surgery.    An After Visit Summary was printed and given to the patient.  ~15 minutes face to face time of which over 50% was spent in counseling.

## 2018-08-02 NOTE — Patient Instructions (Addendum)
Call if your prolapse is bothering you (it's okay if you feel it when you wash or go to the bathroom). Call if you have trouble emptying your bladder. Avoid heavy lifting and straining.    Kegel Exercises  Kegel exercises can help strengthen your pelvic floor muscles. The pelvic floor is a group of muscles that support your rectum, small intestine, and bladder. In females, pelvic floor muscles also help support the womb (uterus). These muscles help you control the flow of urine and stool. Kegel exercises are painless and simple, and they do not require any equipment. Your provider may suggest Kegel exercises to:  Improve bladder and bowel control.  Improve sexual response.  Improve weak pelvic floor muscles after surgery to remove the uterus (hysterectomy) or pregnancy (females).  Improve weak pelvic floor muscles after prostate gland removal or surgery (males). Kegel exercises involve squeezing your pelvic floor muscles, which are the same muscles you squeeze when you try to stop the flow of urine or keep from passing gas. The exercises can be done while sitting, standing, or lying down, but it is best to vary your position. Exercises How to do Kegel exercises: 1. Squeeze your pelvic floor muscles tight. You should feel a tight lift in your rectal area. If you are a female, you should also feel a tightness in your vaginal area. Keep your stomach, buttocks, and legs relaxed. 2. Hold the muscles tight for up to 10 seconds. 3. Breathe normally. 4. Relax your muscles. 5. Repeat as told by your health care provider. Repeat this exercise daily as told by your health care provider. Continue to do this exercise for at least 4-6 weeks, or for as long as told by your health care provider. You may be referred to a physical therapist who can help you learn more about how to do Kegel exercises. Depending on your condition, your health care provider may recommend:  Varying how long you squeeze your  muscles.  Doing several sets of exercises every day.  Doing exercises for several weeks.  Making Kegel exercises a part of your regular exercise routine. This information is not intended to replace advice given to you by your health care provider. Make sure you discuss any questions you have with your health care provider. Document Released: 12/16/2011 Document Revised: 08/18/2017 Document Reviewed: 08/18/2017 Elsevier Patient Education  2020 [employer].

## 2018-08-03 ENCOUNTER — Telehealth: Payer: Self-pay | Admitting: Internal Medicine

## 2018-08-03 ENCOUNTER — Telehealth: Payer: Self-pay | Admitting: Obstetrics and Gynecology

## 2018-08-03 NOTE — Telephone Encounter (Signed)
Spoke with patient. Reviewed AVS regarding Kegel exercise with patient. Advised to do Kegel exercises at least once daily, recommended doing them several times throughout the day. Questions answered.   Routing to provider for final review. Patient is agreeable to disposition. Will close encounter.

## 2018-08-03 NOTE — Telephone Encounter (Signed)
Patient just wanted this as a FYI to Dr.[name], She already has an appointment on 8/4.

## 2018-08-03 NOTE — Telephone Encounter (Signed)
Patient is asking how may times a day is she supposed to do the Kegel exercises?

## 2018-08-03 NOTE — Telephone Encounter (Signed)
Pt called today to make Dr Alain Marion aware of worsening memory issues.  Pt states she feels that her ability to remember where she has placed items is much worse in the last few weeks.  Pt also states she was having difficulty following instructions while gardening with her friends and she became very upset today when she was unable to locate her wallet before leaving on an errand. Pt would like to have Dr Alain Marion call her to discuss options for medications/therapy that might improve her memory.  Please call pt: [phone]

## 2018-08-09 DIAGNOSIS — G4733 Obstructive sleep apnea (adult) (pediatric): Secondary | ICD-10-CM | POA: Diagnosis not present

## 2018-08-11 ENCOUNTER — Encounter: Payer: Self-pay | Admitting: Sports Medicine

## 2018-08-11 ENCOUNTER — Ambulatory Visit (INDEPENDENT_AMBULATORY_CARE_PROVIDER_SITE_OTHER): Payer: Medicare HMO | Admitting: Sports Medicine

## 2018-08-11 ENCOUNTER — Other Ambulatory Visit: Payer: Self-pay

## 2018-08-11 ENCOUNTER — Other Ambulatory Visit: Payer: Self-pay | Admitting: Internal Medicine

## 2018-08-11 ENCOUNTER — Telehealth: Payer: Self-pay | Admitting: Obstetrics and Gynecology

## 2018-08-11 DIAGNOSIS — M79674 Pain in right toe(s): Secondary | ICD-10-CM | POA: Diagnosis not present

## 2018-08-11 DIAGNOSIS — M79675 Pain in left toe(s): Secondary | ICD-10-CM

## 2018-08-11 DIAGNOSIS — E1142 Type 2 diabetes mellitus with diabetic polyneuropathy: Secondary | ICD-10-CM

## 2018-08-11 DIAGNOSIS — B351 Tinea unguium: Secondary | ICD-10-CM | POA: Diagnosis not present

## 2018-08-11 MED ORDER — LORAZEPAM 1 MG PO TABS: 1.0000 mg | ORAL_TABLET | Freq: Two times a day (BID) | ORAL | 1 refills | Status: DC | PRN

## 2018-08-11 NOTE — Telephone Encounter (Signed)
Patient notified we will order pessary and call her when it arrives to make appointment for insertion.

## 2018-08-11 NOTE — Telephone Encounter (Signed)
Pt request refill  LORazepam (ATIVAN) 1 MG tablet .    Pt states she does not have enough to last until mail order can get to her.  Can you send 14 tabs to local  [place], [place] day to   [place], [place] to [employer] [phone] (Phone) [phone] (Fax)   Pt has appt Tues August 4, but will not have any to get through to this appt.

## 2018-08-11 NOTE — Telephone Encounter (Signed)
I will ask Gay Filler to order a #2 cube pessary for her and call her when it's in to make an appointment for insertion. Please let her know.  Thanks!  CC: Lamont Snowball, RN

## 2018-08-11 NOTE — Progress Notes (Signed)
Subjective: Tiffany Gibbs is a 73 y.o. female patient with history of diabetes who returns to office today complaining of long,mildly painful nails  while ambulating in shoes; unable to trim. Using penlac to big toenails not sure if its helping. Patient states that the glucose reading this morning was not recorded last A1c 5.6 and last saw her primary care doctor a few months ago and will see again next month for a repeat A1c. No other issues.    Patient Active Problem List   Diagnosis Date Noted  . Vaginal prolapse 07/19/2018  . 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 Obstructive sleep apnea treated with continuous positive airway pressure (CPAP) 06/04/2017  . Degenerative arthritis of knee, bilateral 05/25/2017  . Left lumbar radiculopathy 04/27/2017  . Right knee pain 04/27/2017  . Tick bite 08/18/2016  . Drusen of macula of both eyes 05/04/2016  . Dry eyes, bilateral 05/04/2016  . Pseudophakia of both eyes 05/04/2016  . PVD (posterior vitreous detachment), both eyes 05/04/2016  . Dupuytren's disease of palm 04/01/2016  . Degenerative disc disease, lumbar 12/04/2015  . Elevated hemoglobin ([place]) 04/17/2015  . Occipital neuralgia of right side 04/17/2015  . Well adult exam 11/19/2014  . Asthmatic bronchitis 10/15/2014  . Neck pain on right side 10/15/2014  . Acute sinusitis 09/26/2014  . Dyslipidemia 06/18/2014  . Fracture of middle phalanx of finger 05/02/2014  . Hand pain, right 04/19/2014  . DM2 (diabetes mellitus, type 2) ([place]) 04/19/2014  . Hematoma of leg 12/15/2013  . Phlebitis of superficial vein of lower extremity 11/30/2013  . Otitis, externa, infective 11/30/2013  . Onychomycosis 08/30/2013  . Lower leg pain 05/10/2013  . Corn or callus 02/09/2013  . Acute posthemorrhagic anemia 01/19/2013  . Constipation 12/16/2012  . Left hip pain 12/16/2012  . Osteoarthritis of hip Left 11/27/2012  . Rash and nonspecific skin eruption 07/27/2012   . Cough 03/31/2012  . Diarrhea 02/05/2012  . Microhematuria 09/09/2011  . Elevated LFTs 09/09/2011  . B12 deficiency 06/03/2011  . Cellulitis 03/10/2011  . Edema 03/10/2011  . Paresthesia of arm 02/25/2011  . Memory difficulty 11/03/2010  . Fibromyalgia syndrome 07/10/2010  . OSA (obstructive sleep apnea) 07/02/2010  . SIALADENITIS 11/25/2009  . TOBACCO USE, QUIT 04/04/2009  . ALLERGIC RHINITIS CAUSE UNSPECIFIED 10/04/2008  . URTICARIA 10/04/2008  . URINARY RETENTION 09/27/2008  . HIP PAIN 07/17/2008  . CERVICAL RADICULITIS 07/21/2007  . Headache 07/21/2007  . Osteoporosis 02/16/2007  . LOW BACK PAIN 12/27/2006  . Obesity 08/07/2006  . Anxiety 08/07/2006  . Depression 08/07/2006  . Hypertension, essential 08/07/2006  . OSTEOARTHRITIS 08/07/2006   Current Outpatient Medications on File Prior to Visit  Medication Sig Dispense Refill  . Cyanocobalamin (B-12) 2500 MCG SUBL Place under the tongue.    . diphenhydrAMINE HCl (BENADRYL ALLERGY PO) Take by mouth as needed.    . fluticasone (FLONASE) 50 MCG/ACT nasal spray Place 1 spray into both nostrils daily as needed for allergies or rhinitis.    . metFORMIN (GLUCOPHAGE) 500 MG tablet TAKE 1 TABLET TWICE DAILY  WITH MEALS 180 tablet 3  . Omega-3 Fatty Acids (FISH OIL) 1200 MG CAPS Take 2,400 mg by mouth 2 (two) times daily.    . potassium chloride (KLOR-CON) 8 MEQ tablet Take 1 tablet (8 mEq total) by mouth 2 (two) times daily. 180 tablet 3  . Vitamin D, Ergocalciferol, (DRISDOL) 1.25 MG (50000 UT) CAPS capsule Take 1 capsule (50,000 Units total) by mouth every 30 (  thirty) days. 3 capsule 4   Current Facility-Administered Medications on File Prior to Visit  Medication Dose Route Frequency Provider Last Rate Last Dose  . 0.9 %  sodium chloride infusion  500 mL Intravenous Continuous Pyrtle, Lajuan Lines, MD      . 0.9 %  sodium chloride infusion  500 mL Intravenous Continuous Pyrtle, Lajuan Lines, MD       Allergies  Allergen Reactions  .  Celecoxib     swell  . Enalapril     cough  . Losartan     falls  . Lovastatin     REACTION: aches  . Naproxen   . Pravachol [Pravastatin Sodium]     Nausea   . Zetia [Ezetimibe]     Myalgias in LEs    Recent Results (from the past 2160 hour(s))  CBC with Differential     Status: None   Collection Time: 07/15/18  7:44 PM  Result Value Ref Range   WBC 6.7 4.0 - 10.5 K/uL   RBC 4.49 3.87 - 5.11 MIL/uL   Hemoglobin 13.7 12.0 - 15.0 g/dL   HCT 39.8 36.0 - 46.0 %   MCV 88.6 80.0 - 100.0 fL   MCH 30.5 26.0 - 34.0 pg   MCHC 34.4 30.0 - 36.0 g/dL   RDW 12.4 11.5 - 15.5 %   Platelets 309 150 - 400 K/uL   nRBC 0.0 0.0 - 0.2 %   Neutrophils Relative % 53 %   Neutro Abs 3.6 1.7 - 7.7 K/uL   Lymphocytes Relative 33 %   Lymphs Abs 2.2 0.7 - 4.0 K/uL   Monocytes Relative 11 %   Monocytes Absolute 0.8 0.1 - 1.0 K/uL   Eosinophils Relative 2 %   Eosinophils Absolute 0.1 0.0 - 0.5 K/uL   Basophils Relative 1 %   Basophils Absolute 0.1 0.0 - 0.1 K/uL   Immature Granulocytes 0 %   Abs Immature Granulocytes 0.02 0.00 - 0.07 K/uL    Comment: Performed at [facility], [address] [place], [place] [address]  Basic metabolic panel     Status: Abnormal   Collection Time: 07/15/18  7:44 PM  Result Value Ref Range   Sodium 136 135 - 145 mmol/L   Potassium 3.9 3.5 - 5.1 mmol/L   Chloride 98 98 - 111 mmol/L   CO2 24 22 - 32 mmol/L   Glucose, Bld 94 70 - 99 mg/dL   BUN 26 (H) 8 - 23 mg/dL   Creatinine, Ser 1.13 (H) 0.44 - 1.00 mg/dL   Calcium 9.9 8.9 - 10.3 mg/dL   GFR calc non Af Amer 48 (L) >60 mL/min   GFR calc Af Amer 56 (L) >60 mL/min   Anion gap 14 5 - 15    Comment: Performed at [facility], [place] [address] [place], [place] [address]  D-dimer, quantitative (not at [facility])     Status: None   Collection Time: 07/15/18  8:08 PM  Result Value Ref Range   D-Dimer, Quant 0.35 0.00 - 0.50 ug/mL-FEU    Comment: (NOTE) At the manufacturer cut-off of 0.50 ug/mL FEU, this  assay has been documented to exclude PE with a sensitivity and negative predictive value of 97 to 99%.  At this time, this assay has not been approved by the FDA to exclude DVT/VTE. Results should be correlated with clinical presentation. Performed at [facility], [place] [address], [place], [place] [address]   Magnesium  Status: None   Collection Time: 07/15/18  8:08 PM  Result Value Ref Range   Magnesium 2.3 1.7 - 2.4 mg/dL    Comment: Performed at [facility], [place] [address], [place], [place] [address]  CK     Status: None   Collection Time: 07/19/18 12:17 PM  Result Value Ref Range   Total CK 164 7 - 177 U/L  Magnesium     Status: None   Collection Time: 07/19/18 12:17 PM  Result Value Ref Range   Magnesium 2.0 1.5 - 2.5 mg/dL  Basic metabolic panel     Status: Abnormal   Collection Time: 07/19/18 12:17 PM  Result Value Ref Range   Sodium 138 135 - 145 mEq/L   Potassium 3.8 3.5 - 5.1 mEq/L   Chloride 102 96 - 112 mEq/L   CO2 24 19 - 32 mEq/L   Glucose, Bld 86 70 - 99 mg/dL   BUN 17 6 - 23 mg/dL   Creatinine, Ser 0.92 0.40 - 1.20 mg/dL   Calcium 9.5 8.4 - 10.5 mg/dL   GFR 59.83 (L) >60.00 mL/min  Hemoglobin A1c     Status: None   Collection Time: 07/19/18 12:17 PM  Result Value Ref Range   Hgb A1c MFr Bld 5.5 4.6 - 6.5 %    Comment: Glycemic Control Guidelines for People with Diabetes:Non Diabetic:  <6%Goal of Therapy: <7%Additional Action Suggested:  >8%   Urine Culture     Status: None   Collection Time: 07/26/18  4:18 PM   Specimen: Urine, Clean Catch   UC  Result Value Ref Range   Urine Culture, Routine Final report    Organism ID, Bacteria No growth   Urine Microscopic     Status: None   Collection Time: 07/26/18  4:18 PM  Result Value Ref Range   WBC, UA None seen 0 - 5 /hpf   RBC 0-2 0 - 2 /hpf   Epithelial Cells (non renal) 0-10 0 - 10 /hpf   Casts None seen None seen /lpf   Mucus, UA Present Not Estab.   Bacteria, UA None seen None  seen/Few    Objective: General: Patient is awake, alert, and oriented x 3 and in no acute distress.  Integument: Skin is warm, dry and supple bilateral. Nails are tender, long, thickened and  dystrophic with subungual debris, consistent with onychomycosis, 1-5 bilateral with bilateral hallux most involved. No signs of infection. No open lesions or preulcerative lesions present bilateral. Remaining integument unremarkable.  Vasculature:  Dorsalis Pedis pulse 1/4 bilateral. Posterior Tibial pulse  1/4 bilateral.  Capillary fill time <3 sec 1-5 bilateral. Positive hair growth to the level of the digits. Temperature gradient within normal limits.  Minimal varicosities present bilateral. No edema present bilateral.   Neurology: The patient has intact sensation measured with a 5.07/10g Semmes Weinstein Monofilament at all pedal sites bilateral . Vibratory sensation diminished bilateral with tuning fork. No Babinski sign present bilateral.  Subjective cramping occasionally in both feet and lower extremities.  Musculoskeletal: Asymptomatic hammertoe pedal deformities noted bilateral. Muscular strength 5/5 in all lower extremity muscular groups bilateral without pain on range of motion . No tenderness with calf compression bilateral.  Assessment and Plan: Problem List Items Addressed This Visit    None    Visit Diagnoses    Pain due to onychomycosis of toenails of both feet    -  Primary   Diabetic polyneuropathy associated with type 2 diabetes mellitus ([place])         -  Examined patient. -Discussed and educated patient on diabetic foot care, especially with  regards to the vascular, neurological and musculoskeletal systems.  -Stressed the importance of good glycemic control and the detriment of not  controlling glucose levels in relation to the foot. -Mechanically debrided all nails 1-5 bilateral using sterile nail nipper and filed with dremel without incident  -Continue with penlac -Answered  all patient questions -Patient to return  in 3 months for at risk foot care  -Patient advised to call the office if any problems or questions arise in the meantime.  Landis Martins, DPM

## 2018-08-11 NOTE — Telephone Encounter (Signed)
[place] Controlled Database Checked Last filled: 10/20/17 #180 LOV w/you: 07/19/18 Next appt w/you: 08/16/18  Ok to refill few days supply at local AND mail order pharmacy?

## 2018-08-11 NOTE — Telephone Encounter (Signed)
Patient called stating bladder prolapse is worse and is ready for pessary. Advised patient will check with Dr.Jertson to see if one was ordered and call her back.

## 2018-08-11 NOTE — Telephone Encounter (Signed)
Okay.  Thanks.

## 2018-08-11 NOTE — Telephone Encounter (Signed)
Patient called stating her bladder has dropped again. She would like to talk with a nurse.

## 2018-08-12 NOTE — Telephone Encounter (Signed)
Pt informed Rx was sent to [facility].

## 2018-08-15 ENCOUNTER — Encounter: Payer: Medicare HMO | Admitting: Internal Medicine

## 2018-08-16 ENCOUNTER — Ambulatory Visit (INDEPENDENT_AMBULATORY_CARE_PROVIDER_SITE_OTHER): Payer: Medicare HMO | Admitting: Internal Medicine

## 2018-08-16 ENCOUNTER — Other Ambulatory Visit: Payer: Self-pay

## 2018-08-16 ENCOUNTER — Encounter: Payer: Medicare HMO | Admitting: Internal Medicine

## 2018-08-16 ENCOUNTER — Encounter: Payer: Self-pay | Admitting: Internal Medicine

## 2018-08-16 DIAGNOSIS — E538 Deficiency of other specified B group vitamins: Secondary | ICD-10-CM

## 2018-08-16 DIAGNOSIS — R252 Cramp and spasm: Secondary | ICD-10-CM

## 2018-08-16 DIAGNOSIS — I10 Essential (primary) hypertension: Secondary | ICD-10-CM

## 2018-08-16 DIAGNOSIS — E785 Hyperlipidemia, unspecified: Secondary | ICD-10-CM | POA: Diagnosis not present

## 2018-08-16 DIAGNOSIS — R69 Illness, unspecified: Secondary | ICD-10-CM | POA: Diagnosis not present

## 2018-08-16 DIAGNOSIS — E119 Type 2 diabetes mellitus without complications: Secondary | ICD-10-CM

## 2018-08-16 DIAGNOSIS — D582 Other hemoglobinopathies: Secondary | ICD-10-CM

## 2018-08-16 DIAGNOSIS — F3340 Major depressive disorder, recurrent, in remission, unspecified: Secondary | ICD-10-CM | POA: Diagnosis not present

## 2018-08-16 NOTE — Assessment & Plan Note (Signed)
On B12 

## 2018-08-16 NOTE — Telephone Encounter (Signed)
30 day supply was sent on 08/11/18 to local pharmacy. Received request from mail order for 90 day supply. Ok to Rf?

## 2018-08-16 NOTE — Progress Notes (Signed)
GYNECOLOGY  VISIT   HPI: 73 y.o.   Married White or Caucasian Not Hispanic or Latino  female   [id number] with No LMP recorded. Patient has had a hysterectomy.   here for pessary placement. She has a grade 2 cystocele. A few weeks ago she was fitted with a #2 cube pessary, ended up not using it secondary to minimal symptoms. In the last few weeks, she has occasionally noticed some vaginal pressure and wants to try the pessary.   She has mild and tolerable urge incontinence. No GSI. Normal PVR, normal urine culture. H/O hysterectomy.    GYNECOLOGIC HISTORY: No LMP recorded. Patient has had a hysterectomy. Contraception: hysterectomy Menopausal hormone therapy: None        OB History    Gravida  1   Para  1   Term  1   Preterm      AB      Living  1     SAB      TAB      Ectopic      Multiple      Live Births  1              Patient Active Problem List   Diagnosis Date Noted  . Cramp of limb 08/16/2018  . Vaginal prolapse 07/19/2018  . 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 Obstructive sleep apnea treated with continuous positive airway pressure (CPAP) 06/04/2017  . Degenerative arthritis of knee, bilateral 05/25/2017  . Left lumbar radiculopathy 04/27/2017  . Right knee pain 04/27/2017  . Tick bite 08/18/2016  . Drusen of macula of both eyes 05/04/2016  . Dry eyes, bilateral 05/04/2016  . Pseudophakia of both eyes 05/04/2016  . PVD (posterior vitreous detachment), both eyes 05/04/2016  . Dupuytren's disease of palm 04/01/2016  . Degenerative disc disease, lumbar 12/04/2015  . Elevated hemoglobin ([place]) 04/17/2015  . Occipital neuralgia of right side 04/17/2015  . Well adult exam 11/19/2014  . Asthmatic bronchitis 10/15/2014  . Neck pain on right side 10/15/2014  . Acute sinusitis 09/26/2014  . Dyslipidemia 06/18/2014  . Fracture of middle phalanx of finger 05/02/2014  . Hand pain, right 04/19/2014  . DM2 (diabetes  mellitus, type 2) ([place]) 04/19/2014  . Hematoma of leg 12/15/2013  . Phlebitis of superficial vein of lower extremity 11/30/2013  . Otitis, externa, infective 11/30/2013  . Onychomycosis 08/30/2013  . Lower leg pain 05/10/2013  . Corn or callus 02/09/2013  . Acute posthemorrhagic anemia 01/19/2013  . Constipation 12/16/2012  . Left hip pain 12/16/2012  . Osteoarthritis of hip Left 11/27/2012  . Rash and nonspecific skin eruption 07/27/2012  . Cough 03/31/2012  . Diarrhea 02/05/2012  . Microhematuria 09/09/2011  . Elevated LFTs 09/09/2011  . B12 deficiency 06/03/2011  . Cellulitis 03/10/2011  . Edema 03/10/2011  . Paresthesia of arm 02/25/2011  . Memory difficulty 11/03/2010  . Fibromyalgia syndrome 07/10/2010  . OSA (obstructive sleep apnea) 07/02/2010  . SIALADENITIS 11/25/2009  . TOBACCO USE, QUIT 04/04/2009  . ALLERGIC RHINITIS CAUSE UNSPECIFIED 10/04/2008  . URTICARIA 10/04/2008  . URINARY RETENTION 09/27/2008  . HIP PAIN 07/17/2008  . CERVICAL RADICULITIS 07/21/2007  . Headache 07/21/2007  . Osteoporosis 02/16/2007  . LOW BACK PAIN 12/27/2006  . Obesity 08/07/2006  . Anxiety 08/07/2006  . Depression 08/07/2006  . Hypertension, essential 08/07/2006  . OSTEOARTHRITIS 08/07/2006    Past Medical History:  Diagnosis Date  . Anxiety   . Cataract   .  Depression   . DM type 2 (diabetes mellitus, type 2) ([place])   . Fibromyalgia   . H/O dizziness   . Hives    from tomatoes  . HTN (hypertension)   . Hyperlipidemia   . Low back pain   . OSA (obstructive sleep apnea)    uses c-pap  . Osteoarthritis   . Osteoporosis   . SOB (shortness of breath) on exertion     Past Surgical History:  Procedure Laterality Date  . cervical cryotherapy     cervix  . EYE SURGERY     Cataracts/Bil  . LASIK Bilateral   . TOTAL ABDOMINAL HYSTERECTOMY    . TOTAL HIP ARTHROPLASTY  09/2008   right- [place]/  . TOTAL HIP ARTHROPLASTY Left 11/28/2012   Procedure: TOTAL HIP ARTHROPLASTY;   Surgeon: Kerin Salen, MD;  Location: [facility];  Service: Orthopedics;  Laterality: Left;    Current Outpatient Medications  Medication Sig Dispense Refill  . Cyanocobalamin (B-12) 2500 MCG SUBL Place under the tongue.    . diphenhydrAMINE HCl (BENADRYL ALLERGY PO) Take by mouth as needed.    . fluticasone (FLONASE) 50 MCG/ACT nasal spray Place 1 spray into both nostrils daily as needed for allergies or rhinitis.    [name] LORazepam (ATIVAN) 1 MG tablet Take 1 tablet (1 mg total) by mouth 2 (two) times daily as needed for anxiety. 60 tablet 1  . metFORMIN (GLUCOPHAGE) 500 MG tablet TAKE 1 TABLET TWICE DAILY  WITH MEALS 180 tablet 3  . Omega-3 Fatty Acids (FISH OIL) 1200 MG CAPS Take 1,200 mg by mouth 2 (two) times daily.     . potassium chloride (KLOR-CON) 8 MEQ tablet Take 1 tablet (8 mEq total) by mouth 2 (two) times daily. 180 tablet 3  . Vitamin D, Ergocalciferol, (DRISDOL) 1.25 MG (50000 UT) CAPS capsule Take 1 capsule (50,000 Units total) by mouth every 30 (thirty) days. 3 capsule 4   Current Facility-Administered Medications  Medication Dose Route Frequency Provider Last Rate Last Dose  . 0.9 %  sodium chloride infusion  500 mL Intravenous Continuous Pyrtle, Lajuan Lines, MD      . 0.9 %  sodium chloride infusion  500 mL Intravenous Continuous Pyrtle, Lajuan Lines, MD         ALLERGIES: Celecoxib, Enalapril, Losartan, Lovastatin, Naproxen, Pravachol [pravastatin sodium], and Zetia [ezetimibe]  Family History  Problem Relation Age of Onset  . Hypertension Mother   . Diabetes Mother   . Heart disease Father   . Multiple sclerosis Father   . Heart attack Father   . Kidney disease Brother 37       ESRD    Social History   Socioeconomic History  . Marital status: Married    Spouse name: Sam  . Number of children: 1  . Years of education: 39   . Highest education level: Not on file  Occupational History  . Occupation: N/A  Social Needs  . Financial resource strain: Not on file  . Food  insecurity    Worry: Not on file    Inability: Not on file  . Transportation needs    Medical: Not on file    Non-medical: Not on file  Tobacco Use  . Smoking status: Former Smoker    Packs/day: 2.00    Years: 25.00    Pack years: 50.00    Types: Cigarettes    Quit date: 01/13/1983    Years since quitting: 35.6  . Smokeless tobacco: Never Used  Substance and Sexual Activity  . Alcohol use: No    Alcohol/week: 0.0 standard drinks  . Drug use: No  . Sexual activity: Not Currently    Birth control/protection: Post-menopausal, Surgical  Lifestyle  . Physical activity    Days per week: Not on file    Minutes per session: Not on file  . Stress: Not on file  Relationships  . Social Herbalist on phone: Not on file    Gets together: Not on file    Attends religious service: Not on file    Active member of club or organization: Not on file    Attends meetings of clubs or organizations: Not on file    Relationship status: Not on file  . Intimate partner violence    Fear of current or ex partner: Not on file    Emotionally abused: Not on file    Physically abused: Not on file    Forced sexual activity: Not on file  Other Topics Concern  . Not on file  Social History Narrative   Was divorced - newly re-married as of 2009, Sam   1 son      Caffeine use- coffee 1 cup daily     Review of Systems  Constitutional: Negative.   HENT: Negative.   Eyes: Negative.   Respiratory: Negative.   Cardiovascular: Negative.   Gastrointestinal: Negative.   Genitourinary: Negative.   Musculoskeletal: Negative.   Skin: Negative.   Neurological: Negative.   Endo/Heme/Allergies: Negative.   Psychiatric/Behavioral: Negative.     PHYSICAL EXAMINATION:    BP 130/72 (BP Location: Right Arm, Patient Position: Sitting, Cuff Size: Normal)   Pulse 80   Temp 98 F (36.7 C) (Skin)   Wt 144 lb 9.6 oz (65.6 kg)   BMI 25.94 kg/m     General appearance: alert, cooperative and appears  stated age  Pelvic: External genitalia:  no lesions             #2 cube pessary placed  Chaperone was present for exam.  ASSESSMENT Cystocele, mildly symptomatic, wants to try the pessary. #2 cube pessary was placed (unable to fit with a ring pessary)    PLAN F/U on 05/22/18 Call with any questions or concerns Discussed that she might need vaginal estrogen   An After Visit Summary was printed and given to the patient.

## 2018-08-16 NOTE — Telephone Encounter (Signed)
Call to patient. Left message to call back.  Order has been received.

## 2018-08-16 NOTE — Telephone Encounter (Signed)
Patient notified pessary received and appointment scheduled for tomorrow at 1130.   Encounter closed.

## 2018-08-16 NOTE — Assessment & Plan Note (Signed)
Resolving

## 2018-08-16 NOTE — Assessment & Plan Note (Signed)
CBC

## 2018-08-16 NOTE — Patient Instructions (Signed)

## 2018-08-16 NOTE — Progress Notes (Signed)
Subjective:  Patient ID: Tiffany Gibbs, female    DOB: [birth year]  Age: 73 y.o. MRN: [id number]  CC: Annual Exam   HPI FARHIYA ROSTEN presents for cramps - resolving F/u wt loss on diet, DM, HTN  Outpatient Medications Prior to Visit  Medication Sig Dispense Refill   Cyanocobalamin (B-12) 2500 MCG SUBL Place under the tongue.     diphenhydrAMINE HCl (BENADRYL ALLERGY PO) Take by mouth as needed.     fluticasone (FLONASE) 50 MCG/ACT nasal spray Place 1 spray into both nostrils daily as needed for allergies or rhinitis.     LORazepam (ATIVAN) 1 MG tablet Take 1 tablet (1 mg total) by mouth 2 (two) times daily as needed for anxiety. 60 tablet 1   metFORMIN (GLUCOPHAGE) 500 MG tablet TAKE 1 TABLET TWICE DAILY  WITH MEALS 180 tablet 3   Omega-3 Fatty Acids (FISH OIL) 1200 MG CAPS Take 1,200 mg by mouth 2 (two) times daily.      potassium chloride (KLOR-CON) 8 MEQ tablet Take 1 tablet (8 mEq total) by mouth 2 (two) times daily. 180 tablet 3   Vitamin D, Ergocalciferol, (DRISDOL) 1.25 MG (50000 UT) CAPS capsule Take 1 capsule (50,000 Units total) by mouth every 30 (thirty) days. 3 capsule 4   Facility-Administered Medications Prior to Visit  Medication Dose Route Frequency Provider Last Rate Last Dose   0.9 %  sodium chloride infusion  500 mL Intravenous Continuous Pyrtle, Lajuan Lines, MD       0.9 %  sodium chloride infusion  500 mL Intravenous Continuous Pyrtle, Lajuan Lines, MD        ROS: Review of Systems  Constitutional: Negative for activity change, appetite change, chills, fatigue and unexpected weight change.  HENT: Negative for congestion, mouth sores and sinus pressure.   Eyes: Negative for visual disturbance.  Respiratory: Negative for cough and chest tightness.   Gastrointestinal: Negative for abdominal pain and nausea.  Genitourinary: Negative for difficulty urinating, frequency and vaginal pain.  Musculoskeletal: Negative for back pain and gait problem.  Skin: Negative for  pallor and rash.  Neurological: Negative for dizziness, tremors, weakness, numbness and headaches.  Psychiatric/Behavioral: Negative for confusion, sleep disturbance and suicidal ideas.    Objective:  BP 138/76 (BP Location: Left Arm, Patient Position: Sitting, Cuff Size: Normal)    Pulse 83    Temp 97.9 F (36.6 C) (Oral)    Ht 5' 2.6" (1.59 m)    Wt 143 lb (64.9 kg)    SpO2 99%    BMI 25.66 kg/m   BP Readings from Last 3 Encounters:  08/16/18 138/76  08/02/18 (!) 198/64  07/26/18 104/66    Wt Readings from Last 3 Encounters:  08/16/18 143 lb (64.9 kg)  08/02/18 141 lb 9.6 oz (64.2 kg)  07/26/18 140 lb 9.6 oz (63.8 kg)    Physical Exam Constitutional:      General: She is not in acute distress.    Appearance: She is well-developed.  HENT:     Head: Normocephalic.     Right Ear: External ear normal.     Left Ear: External ear normal.     Nose: Nose normal.  Eyes:     General:        Right eye: No discharge.        Left eye: No discharge.     Conjunctiva/sclera: Conjunctivae normal.     Pupils: Pupils are equal, round, and reactive to light.  Neck:  Musculoskeletal: Normal range of motion and neck supple.     Thyroid: No thyromegaly.     Vascular: No JVD.     Trachea: No tracheal deviation.  Cardiovascular:     Rate and Rhythm: Normal rate and regular rhythm.     Heart sounds: Normal heart sounds.  Pulmonary:     Effort: No respiratory distress.     Breath sounds: No stridor. No wheezing.  Abdominal:     General: Bowel sounds are normal. There is no distension.     Palpations: Abdomen is soft. There is no mass.     Tenderness: There is no abdominal tenderness. There is no guarding or rebound.  Musculoskeletal:        General: No tenderness.  Lymphadenopathy:     Cervical: No cervical adenopathy.  Skin:    Findings: No erythema or rash.  Neurological:     Cranial Nerves: No cranial nerve deficit.     Motor: No abnormal muscle tone.     Coordination:  Coordination normal.     Deep Tendon Reflexes: Reflexes normal.  Psychiatric:        Behavior: Behavior normal.        Thought Content: Thought content normal.        Judgment: Judgment normal.     Lab Results  Component Value Date   WBC 6.7 07/15/2018   HGB 13.7 07/15/2018   HCT 39.8 07/15/2018   PLT 309 07/15/2018   GLUCOSE 86 07/19/2018   CHOL 163 12/23/2017   TRIG 80.0 12/23/2017   HDL 52.70 12/23/2017   LDLDIRECT 148.2 02/03/2013   LDLCALC 95 12/23/2017   ALT 10 12/23/2017   AST 13 12/23/2017   NA 138 07/19/2018   K 3.8 07/19/2018   CL 102 07/19/2018   CREATININE 0.92 07/19/2018   BUN 17 07/19/2018   CO2 24 07/19/2018   TSH 1.11 12/23/2017   INR 1.14 11/21/2012   HGBA1C 5.5 07/19/2018   MICROALBUR 0.9 12/23/2017    Le Venous  Result Date: 07/16/2018  Lower Venous Study Indications: Leg cramps.  Comparison Study: 05/19/13 negative Performing Technologist: June Leap RDMS, RVT  Examination Guidelines: A complete evaluation includes B-mode imaging, spectral Doppler, color Doppler, and power Doppler as needed of all accessible portions of each vessel. Bilateral testing is considered an integral part of a complete examination. Limited examinations for reoccurring indications may be performed as noted.  +---------+---------------+---------+-----------+----------+-------+  RIGHT     Compressibility Phasicity Spontaneity Properties Summary  +---------+---------------+---------+-----------+----------+-------+  CFV       Full            Yes       Yes                             +---------+---------------+---------+-----------+----------+-------+  SFJ       Full                                                      +---------+---------------+---------+-----------+----------+-------+  FV Prox   Full                                                      +---------+---------------+---------+-----------+----------+-------+  FV Mid    Full                                                       +---------+---------------+---------+-----------+----------+-------+  FV Distal Full                                                      +---------+---------------+---------+-----------+----------+-------+  PFV       Full                                                      +---------+---------------+---------+-----------+----------+-------+  POP       Full            Yes       Yes                             +---------+---------------+---------+-----------+----------+-------+  PTV       Full                                                      +---------+---------------+---------+-----------+----------+-------+  PERO      Full                                                      +---------+---------------+---------+-----------+----------+-------+     Summary: Right: There is no evidence of deep vein thrombosis in the lower extremity. No cystic structure found in the popliteal fossa.  *See table(s) above for measurements and observations. Electronically signed by Servando Snare MD on 07/16/2018 at 12:34:45 PM.    Final     Assessment & Plan:   There are no diagnoses linked to this encounter.   No orders of the defined types were placed in this encounter.    Follow-up: No follow-ups on file.  Walker Kehr, MD

## 2018-08-16 NOTE — Assessment & Plan Note (Signed)
Celexa

## 2018-08-16 NOTE — Assessment & Plan Note (Signed)
BP Readings from Last 3 Encounters:  08/16/18 138/76  08/02/18 (!) 198/64  07/26/18 104/66

## 2018-08-16 NOTE — Addendum Note (Signed)
Addended by: Cresenciano Lick on: 08/16/2018 11:42 AM   Modules accepted: Orders

## 2018-08-16 NOTE — Assessment & Plan Note (Signed)
On Metformin 

## 2018-08-17 ENCOUNTER — Encounter: Payer: Self-pay | Admitting: Obstetrics and Gynecology

## 2018-08-17 ENCOUNTER — Ambulatory Visit (INDEPENDENT_AMBULATORY_CARE_PROVIDER_SITE_OTHER): Payer: Medicare HMO | Admitting: Obstetrics and Gynecology

## 2018-08-17 VITALS — BP 130/72 | HR 80 | Temp 98.0°F | Wt 144.6 lb

## 2018-08-17 DIAGNOSIS — N8111 Cystocele, midline: Secondary | ICD-10-CM

## 2018-08-17 DIAGNOSIS — N3941 Urge incontinence: Secondary | ICD-10-CM | POA: Diagnosis not present

## 2018-08-18 ENCOUNTER — Telehealth: Payer: Self-pay | Admitting: Obstetrics and Gynecology

## 2018-08-18 ENCOUNTER — Encounter: Payer: Self-pay | Admitting: Obstetrics and Gynecology

## 2018-08-18 NOTE — Telephone Encounter (Signed)
Patient was seen in office on 08/17/18, pessary placed.   Routing to Dr. Talbert Nan to advise.

## 2018-08-18 NOTE — Telephone Encounter (Signed)
Patient sent the following correspondence through [employer]. Routing to triage to assist patient with request.  Good Morning Dr. Talbert Nan,   It's just me Tiffany Gibbs. What a night I had. Three trips to the bathroom and woke up this morning to wet bed sheets. Going to [employer] to purchase some ladies pampers and pads. How long does the leaking last or is this my new future? I read the Pessary leaflet last night. I have mixed feelings about the additional preps I will have to do.     Tiffany Gibbs

## 2018-08-18 NOTE — Telephone Encounter (Signed)
Sometimes with correcting the prolapse, new stress incontinence develops (since your correcting the kinking in the bladder neck). Does she feel comfortable with the pessary, any bulge? If she is uncomfortable the pessary may have shifted. I think she has f/u on Monday, if she feels she can't wait you can try and get her in with another provider tomorrow.

## 2018-08-18 NOTE — Telephone Encounter (Signed)
Spoke with patient, advised per Dr. Talbert Nan. Patient states she is not uncomfortable, no bulging. She will plan to keep OV scheduled for 8/10, will call if any concerns.   Routing to provider for final review. Patient is agreeable to disposition. Will close encounter.

## 2018-08-19 ENCOUNTER — Telehealth: Payer: Self-pay | Admitting: Obstetrics and Gynecology

## 2018-08-19 NOTE — Telephone Encounter (Signed)
Patient left a message on the answering machine asking to talk with Sharee Pimple. She said "I would like to give a report on my pessary to [place]".

## 2018-08-19 NOTE — Telephone Encounter (Signed)
Call reviewed with Dr. Sabra Heck, call returned to patient. Advised patient to f/u with PCP today for further evaluation of facial and leg swelling, can also evaluate for possible UTI. Keep f/u as scheduled for 8/10 with Dr. Talbert Nan. Patient verbalizes understanding and is agreeable.   Routing to provider for final review. Patient is agreeable to disposition. Will close encounter.  Cc: Dr. Talbert Nan

## 2018-08-19 NOTE — Telephone Encounter (Addendum)
Spoke with patient. #2 cube pessary placed 8/5. Patient reports she continues to have "urine leaking", denies pain or bulging. Reports she woke up this morning and face and ankles are swollen, not voiding the same amount as before pessary. Denies SHOB, pain, vag d/c, bleeding or odor. Hx of HTN, states she has been off of her "water pill" for 69mo due to leg cramps. Patient is scheduled for f/u with Dr. Talbert Nan on 8/10.   Advised patient she should f/u with PCP today for further evaluation of symptoms, keep f/u with Dr. Talbert Nan as scheduled. Advised I will review with covering provider and return call. Patient agreeable.

## 2018-08-20 MED ORDER — LORAZEPAM 1 MG PO TABS: 1.0000 mg | ORAL_TABLET | Freq: Two times a day (BID) | ORAL | 0 refills | Status: DC | PRN

## 2018-08-22 ENCOUNTER — Encounter: Payer: Self-pay | Admitting: Obstetrics and Gynecology

## 2018-08-22 ENCOUNTER — Ambulatory Visit (INDEPENDENT_AMBULATORY_CARE_PROVIDER_SITE_OTHER): Payer: Medicare HMO | Admitting: Obstetrics and Gynecology

## 2018-08-22 ENCOUNTER — Other Ambulatory Visit: Payer: Self-pay

## 2018-08-22 VITALS — BP 122/70 | HR 88 | Temp 98.2°F | Wt 138.8 lb

## 2018-08-22 DIAGNOSIS — Z4689 Encounter for fitting and adjustment of other specified devices: Secondary | ICD-10-CM

## 2018-08-22 DIAGNOSIS — N8111 Cystocele, midline: Secondary | ICD-10-CM

## 2018-08-22 DIAGNOSIS — N3941 Urge incontinence: Secondary | ICD-10-CM

## 2018-08-22 DIAGNOSIS — N3942 Incontinence without sensory awareness: Secondary | ICD-10-CM

## 2018-08-22 NOTE — Progress Notes (Signed)
GYNECOLOGY  VISIT   HPI: 73 y.o.   Married White or Caucasian Not Hispanic or Latino  female   G1P1001 with No LMP recorded. Patient has had a hysterectomy.   here for pessary check.  She had a #2 cube pessary placed last week for a grade 2 cystocele.  Prior h/o TAH/BSO years ago. On initial exam she was noted to have a grade one vault prolapse.  The first night the pessary was in, she started leaking. She has been waking up wet. Some urgency to void. She doesn't feel herself leaking, but her pad is wet, changing the depends 3 x a day. No leaking with coughing, she is leaking some on the way to the bathroom. She did c/o tolerable urge incontinence prior to placing the pessary. No pain, no bulge, no bleeding.   GYNECOLOGIC HISTORY: No LMP recorded. Patient has had a hysterectomy. Contraception: Hysterectomy  Menopausal hormone therapy: None        OB History    Gravida  1   Para  1   Term  1   Preterm      AB      Living  1     SAB      TAB      Ectopic      Multiple      Live Births  1              Patient Active Problem List   Diagnosis Date Noted  . Cramp of limb 08/16/2018  . Vaginal prolapse 07/19/2018  . 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 Obstructive sleep apnea treated with continuous positive airway pressure (CPAP) 06/04/2017  . Degenerative arthritis of knee, bilateral 05/25/2017  . Left lumbar radiculopathy 04/27/2017  . Right knee pain 04/27/2017  . Tick bite 08/18/2016  . Drusen of macula of both eyes 05/04/2016  . Dry eyes, bilateral 05/04/2016  . Pseudophakia of both eyes 05/04/2016  . PVD (posterior vitreous detachment), both eyes 05/04/2016  . Dupuytren's disease of palm 04/01/2016  . Degenerative disc disease, lumbar 12/04/2015  . Elevated hemoglobin ([place]) 04/17/2015  . Occipital neuralgia of right side 04/17/2015  . Well adult exam 11/19/2014  . Asthmatic bronchitis 10/15/2014  . Neck pain on right  side 10/15/2014  . Acute sinusitis 09/26/2014  . Dyslipidemia 06/18/2014  . Fracture of middle phalanx of finger 05/02/2014  . Hand pain, right 04/19/2014  . DM2 (diabetes mellitus, type 2) ([place]) 04/19/2014  . Hematoma of leg 12/15/2013  . Phlebitis of superficial vein of lower extremity 11/30/2013  . Otitis, externa, infective 11/30/2013  . Onychomycosis 08/30/2013  . Lower leg pain 05/10/2013  . Corn or callus 02/09/2013  . Acute posthemorrhagic anemia 01/19/2013  . Constipation 12/16/2012  . Left hip pain 12/16/2012  . Osteoarthritis of hip Left 11/27/2012  . Rash and nonspecific skin eruption 07/27/2012  . Cough 03/31/2012  . Diarrhea 02/05/2012  . Microhematuria 09/09/2011  . Elevated LFTs 09/09/2011  . B12 deficiency 06/03/2011  . Cellulitis 03/10/2011  . Edema 03/10/2011  . Paresthesia of arm 02/25/2011  . Memory difficulty 11/03/2010  . Fibromyalgia syndrome 07/10/2010  . OSA (obstructive sleep apnea) 07/02/2010  . SIALADENITIS 11/25/2009  . TOBACCO USE, QUIT 04/04/2009  . ALLERGIC RHINITIS CAUSE UNSPECIFIED 10/04/2008  . URTICARIA 10/04/2008  . URINARY RETENTION 09/27/2008  . HIP PAIN 07/17/2008  . CERVICAL RADICULITIS 07/21/2007  . Headache 07/21/2007  . Osteoporosis 02/16/2007  . LOW BACK PAIN 12/27/2006  .  Obesity 08/07/2006  . Anxiety 08/07/2006  . Depression 08/07/2006  . Hypertension, essential 08/07/2006  . OSTEOARTHRITIS 08/07/2006    Past Medical History:  Diagnosis Date  . Anxiety   . Cataract   . Depression   . DM type 2 (diabetes mellitus, type 2) ([place])   . Fibromyalgia   . H/O dizziness   . Hives    from tomatoes  . HTN (hypertension)   . Hyperlipidemia   . Low back pain   . OSA (obstructive sleep apnea)    uses c-pap  . Osteoarthritis   . Osteoporosis   . SOB (shortness of breath) on exertion     Past Surgical History:  Procedure Laterality Date  . cervical cryotherapy     cervix  . EYE SURGERY     Cataracts/Bil  . LASIK  Bilateral   . TOTAL ABDOMINAL HYSTERECTOMY    . TOTAL HIP ARTHROPLASTY  09/2008   right- [place]/  . TOTAL HIP ARTHROPLASTY Left 11/28/2012   Procedure: TOTAL HIP ARTHROPLASTY;  Surgeon: Kerin Salen, MD;  Location: [facility];  Service: Orthopedics;  Laterality: Left;    Current Outpatient Medications  Medication Sig Dispense Refill  . Cyanocobalamin (B-12) 2500 MCG SUBL Place under the tongue.    . diphenhydrAMINE HCl (BENADRYL ALLERGY PO) Take by mouth as needed.    . fluticasone (FLONASE) 50 MCG/ACT nasal spray Place 1 spray into both nostrils daily as needed for allergies or rhinitis.    [name] LORazepam (ATIVAN) 1 MG tablet Take 1 tablet (1 mg total) by mouth 2 (two) times daily as needed for anxiety. 180 tablet 0  . metFORMIN (GLUCOPHAGE) 500 MG tablet TAKE 1 TABLET TWICE DAILY  WITH MEALS 180 tablet 3  . Omega-3 Fatty Acids (FISH OIL) 1200 MG CAPS Take 1,200 mg by mouth 2 (two) times daily.     . potassium chloride (KLOR-CON) 8 MEQ tablet Take 1 tablet (8 mEq total) by mouth 2 (two) times daily. 180 tablet 3  . Vitamin D, Ergocalciferol, (DRISDOL) 1.25 MG (50000 UT) CAPS capsule Take 1 capsule (50,000 Units total) by mouth every 30 (thirty) days. 3 capsule 4   Current Facility-Administered Medications  Medication Dose Route Frequency Provider Last Rate Last Dose  . 0.9 %  sodium chloride infusion  500 mL Intravenous Continuous Pyrtle, Lajuan Lines, MD      . 0.9 %  sodium chloride infusion  500 mL Intravenous Continuous Pyrtle, Lajuan Lines, MD         ALLERGIES: Celecoxib, Enalapril, Losartan, Lovastatin, Naproxen, Pravachol [pravastatin sodium], and Zetia [ezetimibe]  Family History  Problem Relation Age of Onset  . Hypertension Mother   . Diabetes Mother   . Heart disease Father   . Multiple sclerosis Father   . Heart attack Father   . Kidney disease Brother 43       ESRD    Social History   Socioeconomic History  . Marital status: Married    Spouse name: Sam  . Number of children: 1  .  Years of education: 47   . Highest education level: Not on file  Occupational History  . Occupation: N/A  Social Needs  . Financial resource strain: Not on file  . Food insecurity    Worry: Not on file    Inability: Not on file  . Transportation needs    Medical: Not on file    Non-medical: Not on file  Tobacco Use  . Smoking status: Former Smoker  Packs/day: 2.00    Years: 25.00    Pack years: 50.00    Types: Cigarettes    Quit date: 01/13/1983    Years since quitting: 35.6  . Smokeless tobacco: Never Used  Substance and Sexual Activity  . Alcohol use: No    Alcohol/week: 0.0 standard drinks  . Drug use: No  . Sexual activity: Not Currently    Birth control/protection: Post-menopausal, Surgical  Lifestyle  . Physical activity    Days per week: Not on file    Minutes per session: Not on file  . Stress: Not on file  Relationships  . Social Herbalist on phone: Not on file    Gets together: Not on file    Attends religious service: Not on file    Active member of club or organization: Not on file    Attends meetings of clubs or organizations: Not on file    Relationship status: Not on file  . Intimate partner violence    Fear of current or ex partner: Not on file    Emotionally abused: Not on file    Physically abused: Not on file    Forced sexual activity: Not on file  Other Topics Concern  . Not on file  Social History Narrative   Was divorced - newly re-married as of 2009, Sam   1 son      Caffeine use- coffee 1 cup daily       PHYSICAL EXAMINATION:    BP 122/70 (BP Location: Right Arm, Patient Position: Sitting, Cuff Size: Normal)   Pulse 88   Temp 98.2 F (36.8 C) (Skin)   Wt 138 lb 12.8 oz (63 kg)   BMI 24.90 kg/m     General appearance: alert, cooperative and appears stated age  Pelvic: External genitalia:  no lesions              Urethra:  normal appearing urethra with no masses, tenderness or lesions              Bartholins and  Skenes: normal                 Vagina: the pessary was noted to be in place both supine and standing, with and without valsalva. The pessary was removed and cleaned. Normal appearing atrophic vagina with mild irritation at the apex. No significant vault prolapse was noted.               Cervix: absent              Bimanual Exam:  Uterus:  uterus absent              Adnexa: no mass, fullness, tenderness               Chaperone was present for exam.  ASSESSMENT Midline cystocele, controlled with the #2 cube pessary New onset random urinary incontinence, no c/o GSI Some worsening urge incontinence    PLAN Pessary left out (given to the patient) Check urine for ua, c&s If she doesn't have a UTI will refer to Urology If she has a UTI, will treat and then replace the pessary    An After Visit Summary was printed and given to the patient.  ~15 minutes face to face time of which over 50% was spent in counseling.

## 2018-08-22 NOTE — Patient Instructions (Signed)

## 2018-08-23 LAB — URINALYSIS, MICROSCOPIC ONLY

## 2018-08-24 ENCOUNTER — Other Ambulatory Visit: Payer: Self-pay

## 2018-08-24 DIAGNOSIS — R39198 Other difficulties with micturition: Secondary | ICD-10-CM

## 2018-08-24 DIAGNOSIS — N3941 Urge incontinence: Secondary | ICD-10-CM

## 2018-08-24 DIAGNOSIS — N3942 Incontinence without sensory awareness: Secondary | ICD-10-CM

## 2018-08-24 DIAGNOSIS — N8111 Cystocele, midline: Secondary | ICD-10-CM

## 2018-08-24 LAB — URINE CULTURE

## 2018-08-24 NOTE — Progress Notes (Signed)
referr

## 2018-08-25 ENCOUNTER — Encounter: Payer: Self-pay | Admitting: Obstetrics and Gynecology

## 2018-08-25 ENCOUNTER — Telehealth: Payer: Self-pay | Admitting: Obstetrics and Gynecology

## 2018-08-25 NOTE — Telephone Encounter (Signed)
Patient sent the following correspondence through [place].  Dear Dr. Talbert Nan,  Two days ago one of your nurses, I didn't get her name said, she or you were making me an appointment with a urologist on [address] The nurse didn't say if this appointment was in [place] or [place]. Were you aware of this, has a request been made for an appointment for me and could you tell who the urologist is.    Thank you.  Tiffany Gibbs   Routing to [place] to assist with referral to [facility].

## 2018-08-26 NOTE — Telephone Encounter (Signed)
Patient is calling stating her bladder has dropped again. She is getting very worried that she has not received a call from the urologist regarding her referral. I told her the our referral coordinator is working on the referral. She would like someone to call her with an update today if possible.

## 2018-08-26 NOTE — Telephone Encounter (Signed)
Left voicemail regarding referral appointment. The information is listed below. Should the patient need to cancel or reschedule this appointment, Please advise them to call the office they've been referred to in order to reschedule.  [facility] [place] [address] [place], [place] Phone: [phone]  Dr. Matilde Sprang 09/14/18 @ 9:30 am. Please arrive 15 minutes early and bring your insurance card and photo id and list of medications.

## 2018-08-27 ENCOUNTER — Other Ambulatory Visit: Payer: Self-pay | Admitting: Internal Medicine

## 2018-08-29 ENCOUNTER — Other Ambulatory Visit: Payer: Self-pay

## 2018-08-30 NOTE — Telephone Encounter (Signed)
Routing to Dr Talbert Nan. FYI. Encounter closed.

## 2018-09-01 ENCOUNTER — Telehealth: Payer: Self-pay | Admitting: Internal Medicine

## 2018-09-01 NOTE — Telephone Encounter (Signed)
Spoke with Tiffany Gibbs regarding AWV. Patient declined to schedule at this time. SF

## 2018-09-02 DIAGNOSIS — D3131 Benign neoplasm of right choroid: Secondary | ICD-10-CM | POA: Diagnosis not present

## 2018-09-02 DIAGNOSIS — H43813 Vitreous degeneration, bilateral: Secondary | ICD-10-CM | POA: Diagnosis not present

## 2018-09-02 DIAGNOSIS — Z7984 Long term (current) use of oral hypoglycemic drugs: Secondary | ICD-10-CM | POA: Diagnosis not present

## 2018-09-02 DIAGNOSIS — H5202 Hypermetropia, left eye: Secondary | ICD-10-CM | POA: Diagnosis not present

## 2018-09-02 DIAGNOSIS — H35363 Drusen (degenerative) of macula, bilateral: Secondary | ICD-10-CM | POA: Diagnosis not present

## 2018-09-02 DIAGNOSIS — H1852 Epithelial (juvenile) corneal dystrophy: Secondary | ICD-10-CM | POA: Diagnosis not present

## 2018-09-02 DIAGNOSIS — H5211 Myopia, right eye: Secondary | ICD-10-CM | POA: Diagnosis not present

## 2018-09-02 DIAGNOSIS — E119 Type 2 diabetes mellitus without complications: Secondary | ICD-10-CM | POA: Diagnosis not present

## 2018-09-02 DIAGNOSIS — H524 Presbyopia: Secondary | ICD-10-CM | POA: Diagnosis not present

## 2018-09-02 DIAGNOSIS — H04123 Dry eye syndrome of bilateral lacrimal glands: Secondary | ICD-10-CM | POA: Diagnosis not present

## 2018-09-07 DIAGNOSIS — G4733 Obstructive sleep apnea (adult) (pediatric): Secondary | ICD-10-CM | POA: Diagnosis not present

## 2018-09-13 DIAGNOSIS — R69 Illness, unspecified: Secondary | ICD-10-CM | POA: Diagnosis not present

## 2018-09-14 ENCOUNTER — Ambulatory Visit (INDEPENDENT_AMBULATORY_CARE_PROVIDER_SITE_OTHER): Payer: Medicare HMO

## 2018-09-14 ENCOUNTER — Other Ambulatory Visit: Payer: Self-pay

## 2018-09-14 DIAGNOSIS — R351 Nocturia: Secondary | ICD-10-CM | POA: Diagnosis not present

## 2018-09-14 DIAGNOSIS — Z23 Encounter for immunization: Secondary | ICD-10-CM

## 2018-09-14 DIAGNOSIS — R350 Frequency of micturition: Secondary | ICD-10-CM | POA: Diagnosis not present

## 2018-09-14 DIAGNOSIS — N3941 Urge incontinence: Secondary | ICD-10-CM | POA: Diagnosis not present

## 2018-09-14 DIAGNOSIS — N8111 Cystocele, midline: Secondary | ICD-10-CM | POA: Diagnosis not present

## 2018-09-29 ENCOUNTER — Telehealth: Payer: Self-pay | Admitting: Internal Medicine

## 2018-09-29 ENCOUNTER — Other Ambulatory Visit: Payer: Self-pay | Admitting: Internal Medicine

## 2018-09-29 NOTE — Telephone Encounter (Signed)
Patient is requesting a rush on medication  Phentermine HCl 37.5 MG  Patient forgot to call in medication and is in [place] now and would like to pick it up today Patient call back [phone]

## 2018-09-30 DIAGNOSIS — N3941 Urge incontinence: Secondary | ICD-10-CM | POA: Diagnosis not present

## 2018-10-07 DIAGNOSIS — G4733 Obstructive sleep apnea (adult) (pediatric): Secondary | ICD-10-CM | POA: Diagnosis not present

## 2018-10-12 DIAGNOSIS — E538 Deficiency of other specified B group vitamins: Secondary | ICD-10-CM

## 2018-10-12 DIAGNOSIS — E119 Type 2 diabetes mellitus without complications: Secondary | ICD-10-CM

## 2018-10-12 DIAGNOSIS — Z0000 Encounter for general adult medical examination without abnormal findings: Secondary | ICD-10-CM

## 2018-10-12 DIAGNOSIS — R413 Other amnesia: Secondary | ICD-10-CM

## 2018-10-12 DIAGNOSIS — I10 Essential (primary) hypertension: Secondary | ICD-10-CM

## 2018-10-12 DIAGNOSIS — I8002 Phlebitis and thrombophlebitis of superficial vessels of left lower extremity: Secondary | ICD-10-CM

## 2018-10-12 DIAGNOSIS — J4520 Mild intermittent asthma, uncomplicated: Secondary | ICD-10-CM

## 2018-10-12 DIAGNOSIS — F411 Generalized anxiety disorder: Secondary | ICD-10-CM

## 2018-10-13 MED ORDER — METFORMIN HCL 500 MG PO TABS: 500.0000 mg | ORAL_TABLET | Freq: Two times a day (BID) | ORAL | 3 refills | Status: DC

## 2018-10-14 DIAGNOSIS — N3941 Urge incontinence: Secondary | ICD-10-CM | POA: Diagnosis not present

## 2018-10-14 DIAGNOSIS — N8111 Cystocele, midline: Secondary | ICD-10-CM | POA: Diagnosis not present

## 2018-10-19 ENCOUNTER — Other Ambulatory Visit: Payer: Self-pay | Admitting: Urology

## 2018-10-20 ENCOUNTER — Other Ambulatory Visit: Payer: Self-pay

## 2018-10-20 ENCOUNTER — Encounter: Payer: Self-pay | Admitting: Internal Medicine

## 2018-10-20 ENCOUNTER — Ambulatory Visit (INDEPENDENT_AMBULATORY_CARE_PROVIDER_SITE_OTHER): Payer: Medicare HMO | Admitting: Internal Medicine

## 2018-10-20 DIAGNOSIS — N8110 Cystocele, unspecified: Secondary | ICD-10-CM | POA: Diagnosis not present

## 2018-10-20 DIAGNOSIS — Z1231 Encounter for screening mammogram for malignant neoplasm of breast: Secondary | ICD-10-CM | POA: Diagnosis not present

## 2018-10-20 MED ORDER — OXYCODONE-ACETAMINOPHEN 5-325 MG PO TABS: 1.0000 | ORAL_TABLET | ORAL | 0 refills | Status: DC | PRN

## 2018-10-20 NOTE — Progress Notes (Signed)
Subjective:  Patient ID: Tiffany Gibbs, female    DOB: Jun 10, 1945  Age: 73 y.o. MRN: [id number]  CC: Pain meds for surgery   HPI Tiffany Gibbs presents for a preop check - planning to have reconstructive pelvic area by Dr Matilde Sprang on 11/01/18. The pt is worried about pain meds post-op - can tolerate Oxycodone/APAP well  Outpatient Medications Prior to Visit  Medication Sig Dispense Refill   Biotin 10000 MCG TABS Take 10,000 mcg by mouth daily.     Cyanocobalamin (B-12) 2500 MCG SUBL Place under the tongue.     diphenhydrAMINE HCl (BENADRYL ALLERGY PO) Take by mouth as needed.     fluticasone (FLONASE) 50 MCG/ACT nasal spray Place 1 spray into both nostrils daily as needed for allergies or rhinitis.     LORazepam (ATIVAN) 1 MG tablet Take 1 tablet (1 mg total) by mouth 2 (two) times daily as needed for anxiety. 180 tablet 0   metFORMIN (GLUCOPHAGE) 500 MG tablet Take 1 tablet (500 mg total) by mouth 2 (two) times daily with a meal. 180 tablet 3   NON FORMULARY Take by mouth. Serovital III, 4Tablets nightly     Omega-3 Fatty Acids (FISH OIL) 1200 MG CAPS Take 1,200 mg by mouth 2 (two) times daily.      phentermine (ADIPEX-P) 37.5 MG tablet TAKE ONE TABLET BY MOUTH ONE TIME DAILY  before breakfast 30 tablet 1   potassium chloride (KLOR-CON) 8 MEQ tablet Take 1 tablet (8 mEq total) by mouth 2 (two) times daily. 180 tablet 3   Probiotic Product ([employer] ADV PROBIOTIC GUMMIES PO) Take by mouth 2 (two) times daily.     Vitamin D, Ergocalciferol, (DRISDOL) 1.25 MG (50000 UT) CAPS capsule Take 1 capsule (50,000 Units total) by mouth every 30 (thirty) days. 3 capsule 4   Facility-Administered Medications Prior to Visit  Medication Dose Route Frequency Provider Last Rate Last Dose   0.9 %  sodium chloride infusion  500 mL Intravenous Continuous Pyrtle, Lajuan Lines, MD       0.9 %  sodium chloride infusion  500 mL Intravenous Continuous Pyrtle, Lajuan Lines, MD        ROS: Review of Systems    Constitutional: Negative for activity change, appetite change, chills, fatigue and unexpected weight change.  HENT: Negative for congestion, mouth sores and sinus pressure.   Eyes: Negative for visual disturbance.  Respiratory: Negative for cough and chest tightness.   Gastrointestinal: Negative for abdominal pain and nausea.  Genitourinary: Positive for frequency and urgency. Negative for difficulty urinating and vaginal pain.  Musculoskeletal: Negative for back pain and gait problem.  Skin: Negative for pallor and rash.  Neurological: Negative for dizziness, tremors, weakness, numbness and headaches.  Psychiatric/Behavioral: Negative for confusion and sleep disturbance. The patient is nervous/anxious.     Objective:  BP 122/76 (BP Location: Left Arm, Patient Position: Sitting, Cuff Size: Normal)    Pulse 89    Temp 98 F (36.7 C) (Oral)    Ht 5' 2.6" (1.59 m)    Wt 143 lb (64.9 kg)    SpO2 98%    BMI 25.66 kg/m   BP Readings from Last 3 Encounters:  10/20/18 122/76  08/22/18 122/70  08/17/18 130/72    Wt Readings from Last 3 Encounters:  10/20/18 143 lb (64.9 kg)  08/22/18 138 lb 12.8 oz (63 kg)  08/17/18 144 lb 9.6 oz (65.6 kg)    Physical Exam Constitutional:      General: She  is not in acute distress.    Appearance: She is well-developed.  HENT:     Head: Normocephalic.     Right Ear: External ear normal.     Left Ear: External ear normal.     Nose: Nose normal.  Eyes:     General:        Right eye: No discharge.        Left eye: No discharge.     Conjunctiva/sclera: Conjunctivae normal.     Pupils: Pupils are equal, round, and reactive to light.  Neck:     Musculoskeletal: Normal range of motion and neck supple.     Thyroid: No thyromegaly.     Vascular: No JVD.     Trachea: No tracheal deviation.  Cardiovascular:     Rate and Rhythm: Normal rate and regular rhythm.     Heart sounds: Normal heart sounds.  Pulmonary:     Effort: No respiratory distress.      Breath sounds: No stridor. No wheezing.  Abdominal:     General: Bowel sounds are normal. There is no distension.     Palpations: Abdomen is soft. There is no mass.     Tenderness: There is no abdominal tenderness. There is no guarding or rebound.  Musculoskeletal:        General: No tenderness.  Lymphadenopathy:     Cervical: No cervical adenopathy.  Skin:    Findings: No erythema or rash.  Neurological:     Mental Status: She is oriented to person, place, and time.     Cranial Nerves: No cranial nerve deficit.     Motor: No abnormal muscle tone.     Coordination: Coordination normal.     Deep Tendon Reflexes: Reflexes normal.  Psychiatric:        Behavior: Behavior normal.        Thought Content: Thought content normal.        Judgment: Judgment normal.   anxious  Lab Results  Component Value Date   WBC 6.7 07/15/2018   HGB 13.7 07/15/2018   HCT 39.8 07/15/2018   PLT 309 07/15/2018   GLUCOSE 86 07/19/2018   CHOL 163 12/23/2017   TRIG 80.0 12/23/2017   HDL 52.70 12/23/2017   LDLDIRECT 148.2 02/03/2013   LDLCALC 95 12/23/2017   ALT 10 12/23/2017   AST 13 12/23/2017   NA 138 07/19/2018   K 3.8 07/19/2018   CL 102 07/19/2018   CREATININE 0.92 07/19/2018   BUN 17 07/19/2018   CO2 24 07/19/2018   TSH 1.11 12/23/2017   INR 1.14 11/21/2012   HGBA1C 5.5 07/19/2018   MICROALBUR 0.9 12/23/2017    Le Venous  Result Date: 07/16/2018  Lower Venous Study Indications: Leg cramps.  Comparison Study: 05/19/13 negative Performing Technologist: June Leap RDMS, RVT  Examination Guidelines: A complete evaluation includes B-mode imaging, spectral Doppler, color Doppler, and power Doppler as needed of all accessible portions of each vessel. Bilateral testing is considered an integral part of a complete examination. Limited examinations for reoccurring indications may be performed as noted.  +---------+---------------+---------+-----------+----------+-------+  RIGHT      Compressibility Phasicity Spontaneity Properties Summary  +---------+---------------+---------+-----------+----------+-------+  CFV       Full            Yes       Yes                             +---------+---------------+---------+-----------+----------+-------+  SFJ       Full                                                      +---------+---------------+---------+-----------+----------+-------+  FV Prox   Full                                                      +---------+---------------+---------+-----------+----------+-------+  FV Mid    Full                                                      +---------+---------------+---------+-----------+----------+-------+  FV Distal Full                                                      +---------+---------------+---------+-----------+----------+-------+  PFV       Full                                                      +---------+---------------+---------+-----------+----------+-------+  POP       Full            Yes       Yes                             +---------+---------------+---------+-----------+----------+-------+  PTV       Full                                                      +---------+---------------+---------+-----------+----------+-------+  PERO      Full                                                      +---------+---------------+---------+-----------+----------+-------+     Summary: Right: There is no evidence of deep vein thrombosis in the lower extremity. No cystic structure found in the popliteal fossa.  *See table(s) above for measurements and observations. Electronically signed by Servando Snare MD on 07/16/2018 at 12:34:45 PM.    Final     Assessment & Plan:   There are no diagnoses linked to this encounter.   No orders of the defined types were placed in this encounter.    Follow-up: No follow-ups on file.  Walker Kehr, MD

## 2018-10-20 NOTE — Assessment & Plan Note (Signed)
The pt is planning to have reconstructive pelvic area by Dr Matilde Sprang on 11/01/18. The pt is worried about pain meds post-op - can tolerate Oxycodone/APAP well: Rx Roxycodone 5/325 to use post-op

## 2018-10-27 NOTE — Patient Instructions (Addendum)
DUE TO COVID-19 ONLY ONE VISITOR IS ALLOWED TO COME WITH YOU AND STAY IN THE WAITING ROOM ONLY DURING PRE OP AND PROCEDURE DAY OF SURGERY. THE 1 VISITOR MAY VISIT WITH YOU AFTER SURGERY IN YOUR PRIVATE ROOM DURING VISITING HOURS ONLY!  YOU NEED TO HAVE A COVID 19 TEST ON 10-28-18.  PLEASE  CONTINUE THE QUARANTINE INSTRUCTIONS AS OUTLINED IN YOUR HANDOUT.                Tiffany Gibbs  10/27/2018   Your procedure is scheduled on: 11-01-18    Report to [facility] Main  Entrance    Report to Admitting at 5:30 AM     Call this number if you have problems the morning of surgery [phone]    Remember: Do not eat food or drink liquids :After Midnight.      Take these medicines the morning of surgery with A SIP OF WATER: Lorazepam (Ativan). You may also use your nasal spray  BRUSH YOUR TEETH MORNING OF SURGERY AND RINSE YOUR MOUTH OUT, NO CHEWING GUM CANDY OR MINTS  DO NOT TAKE ANY DIABETIC MEDICATIONS DAY OF YOUR SURGERY                               You may not have any metal on your body including hair pins and              piercings  Do not wear jewelry, make-up, lotions, powders or perfumes, deodorant             Do not wear nail polish on your fingernails.  Do not shave  48 hours prior to surgery.                 Do not bring valuables to the hospital. [place].  Contacts, dentures or bridgework may not be worn into surgery.  Leave suitcase in the car. After surgery it may be brought to your room.     Special Instructions: N/A              Please read over the following fact sheets you were given: _____________________________________________________________________             How to Manage Your Diabetes Before and After Surgery  Why is it important to control my blood sugar before and after surgery? . Improving blood sugar levels before and after surgery helps healing and can limit problems. . A way of  improving blood sugar control is eating a healthy diet by: o  Eating less sugar and carbohydrates o  Increasing activity/exercise o  Talking with your doctor about reaching your blood sugar goals . High blood sugars (greater than 180 mg/dL) can raise your risk of infections and slow your recovery, so you will need to focus on controlling your diabetes during the weeks before surgery. . Make sure that the doctor who takes care of your diabetes knows about your planned surgery including the date and location.  How do I manage my blood sugar before surgery? . Check your blood sugar at least 4 times a day, starting 2 days before surgery, to make sure that the level is not too high or low. o Check your blood sugar the morning of your surgery when you wake up and every 2 hours until you get  to the Short Stay unit. . If your blood sugar is less than 70 mg/dL, you will need to treat for low blood sugar: o Do not take insulin. o Treat a low blood sugar (less than 70 mg/dL) with  cup of clear juice (cranberry or apple), 4 glucose tablets, OR glucose gel. o Recheck blood sugar in 15 minutes after treatment (to make sure it is greater than 70 mg/dL). If your blood sugar is not greater than 70 mg/dL on recheck, call [phone] for further instructions. . Report your blood sugar to the short stay nurse when you get to Short Stay.  . If you are admitted to the hospital after surgery: o Your blood sugar will be checked by the staff and you will probably be given insulin after surgery (instead of oral diabetes medicines) to make sure you have good blood sugar levels. o The goal for blood sugar control after surgery is 80-180 mg/dL.   WHAT DO I DO ABOUT MY DIABETES MEDICATION?  [name] Do not take oral diabetes medicines (pills) the morning of surgery.  . THE DAY BEFORE SURGERY, take your usual Metformin       [place] - Preparing for Surgery Before surgery, you can play an important role.  Because skin is  not sterile, your skin needs to be as free of germs as possible.  You can reduce the number of germs on your skin by washing with CHG (chlorahexidine gluconate) soap before surgery.  CHG is an antiseptic cleaner which kills germs and bonds with the skin to continue killing germs even after washing. Please DO NOT use if you have an allergy to CHG or antibacterial soaps.  If your skin becomes reddened/irritated stop using the CHG and inform your nurse when you arrive at Short Stay. Do not shave (including legs and underarms) for at least 48 hours prior to the first CHG shower.  You may shave your face/neck. Please follow these instructions carefully:  1.  Shower with CHG Soap the night before surgery and the  morning of Surgery.  2.  If you choose to wash your hair, wash your hair first as usual with your  normal  shampoo.  3.  After you shampoo, rinse your hair and body thoroughly to remove the  shampoo.                           4.  Use CHG as you would any other liquid soap.  You can apply chg directly  to the skin and wash                       Gently with a scrungie or clean washcloth.  5.  Apply the CHG Soap to your body ONLY FROM THE NECK DOWN.   Do not use on face/ open                           Wound or open sores. Avoid contact with eyes, ears mouth and genitals (private parts).                       Wash face,  Genitals (private parts) with your normal soap.             6.  Wash thoroughly, paying special attention to the area where your surgery  will be performed.  7.  Thoroughly rinse your  body with warm water from the neck down.  8.  DO NOT shower/wash with your normal soap after using and rinsing off  the CHG Soap.                9.  Pat yourself dry with a clean towel.            10.  Wear clean pajamas.            11.  Place clean sheets on your bed the night of your first shower and do not  sleep with pets. Day of Surgery : Do not apply any lotions/deodorants the morning of surgery.   Please wear clean clothes to the hospital/surgery center.  FAILURE TO FOLLOW THESE INSTRUCTIONS MAY RESULT IN THE CANCELLATION OF YOUR SURGERY PATIENT SIGNATURE_________________________________  NURSE SIGNATURE__________________________________  ________________________________________________________________________    WHAT IS A BLOOD TRANSFUSION? Blood Transfusion Information  A transfusion is the replacement of blood or some of its parts. Blood is made up of multiple cells which provide different functions.  Red blood cells carry oxygen and are used for blood loss replacement.  White blood cells fight against infection.  Platelets control bleeding.  Plasma helps clot blood.  Other blood products are available for specialized needs, such as hemophilia or other clotting disorders. BEFORE THE TRANSFUSION  Who gives blood for transfusions?   Healthy volunteers who are fully evaluated to make sure their blood is safe. This is blood bank blood. Transfusion therapy is the safest it has ever been in the practice of medicine. Before blood is taken from a donor, a complete history is taken to make sure that person has no history of diseases nor engages in risky social behavior (examples are intravenous drug use or sexual activity with multiple partners). The donor's travel history is screened to minimize risk of transmitting infections, such as malaria. The donated blood is tested for signs of infectious diseases, such as HIV and hepatitis. The blood is then tested to be sure it is compatible with you in order to minimize the chance of a transfusion reaction. If you or a relative donates blood, this is often done in anticipation of surgery and is not appropriate for emergency situations. It takes many days to process the donated blood. RISKS AND COMPLICATIONS Although transfusion therapy is very safe and saves many lives, the main dangers of transfusion include:   Getting an infectious  disease.  Developing a transfusion reaction. This is an allergic reaction to something in the blood you were given. Every precaution is taken to prevent this. The decision to have a blood transfusion has been considered carefully by your caregiver before blood is given. Blood is not given unless the benefits outweigh the risks. AFTER THE TRANSFUSION  Right after receiving a blood transfusion, you will usually feel much better and more energetic. This is especially true if your red blood cells have gotten low (anemic). The transfusion raises the level of the red blood cells which carry oxygen, and this usually causes an energy increase.  The nurse administering the transfusion will monitor you carefully for complications. HOME CARE INSTRUCTIONS  No special instructions are needed after a transfusion. You may find your energy is better. Speak with your caregiver about any limitations on activity for underlying diseases you may have. SEEK MEDICAL CARE IF:   Your condition is not improving after your transfusion.  You develop redness or irritation at the intravenous (IV) site. SEEK IMMEDIATE MEDICAL CARE IF:  Any of the  following symptoms occur over the next 12 hours:  Shaking chills.  You have a temperature by mouth above 102 F (38.9 C), not controlled by medicine.  Chest, back, or muscle pain.  People around you feel you are not acting correctly or are confused.  Shortness of breath or difficulty breathing.  Dizziness and fainting.  You get a rash or develop hives.  You have a decrease in urine output.  Your urine turns a dark color or changes to pink, red, or brown. Any of the following symptoms occur over the next 10 days:  You have a temperature by mouth above 102 F (38.9 C), not controlled by medicine.  Shortness of breath.  Weakness after normal activity.  The white part of the eye turns yellow (jaundice).  You have a decrease in the amount of urine or are  urinating less often.  Your urine turns a dark color or changes to pink, red, or brown. Document Released: 12/27/1999 Document Revised: 03/23/2011 Document Reviewed: 08/15/2007 [facility] Patient Information 2014 [place], [place].  _______________________________________________________________________

## 2018-10-28 ENCOUNTER — Encounter (HOSPITAL_COMMUNITY): Payer: Self-pay

## 2018-10-28 ENCOUNTER — Other Ambulatory Visit (HOSPITAL_COMMUNITY)
Admission: RE | Admit: 2018-10-28 | Discharge: 2018-10-28 | Disposition: A | Discharge status: Home / Self Care | Payer: Medicare HMO | Source: Ambulatory Visit | Attending: Urology | Admitting: Urology

## 2018-10-28 ENCOUNTER — Encounter (HOSPITAL_COMMUNITY)
Admission: RE | Admit: 2018-10-28 | Discharge: 2018-10-28 | Disposition: A | Discharge status: Home / Self Care | Payer: Medicare HMO | Source: Ambulatory Visit | Attending: Family Medicine | Admitting: Family Medicine

## 2018-10-28 ENCOUNTER — Other Ambulatory Visit: Payer: Self-pay

## 2018-10-28 DIAGNOSIS — E119 Type 2 diabetes mellitus without complications: Secondary | ICD-10-CM | POA: Diagnosis not present

## 2018-10-28 DIAGNOSIS — N3941 Urge incontinence: Secondary | ICD-10-CM | POA: Insufficient documentation

## 2018-10-28 DIAGNOSIS — G4730 Sleep apnea, unspecified: Secondary | ICD-10-CM | POA: Diagnosis not present

## 2018-10-28 DIAGNOSIS — Z20828 Contact with and (suspected) exposure to other viral communicable diseases: Secondary | ICD-10-CM | POA: Diagnosis not present

## 2018-10-28 DIAGNOSIS — Z01818 Encounter for other preprocedural examination: Secondary | ICD-10-CM | POA: Insufficient documentation

## 2018-10-28 DIAGNOSIS — I10 Essential (primary) hypertension: Secondary | ICD-10-CM | POA: Insufficient documentation

## 2018-10-28 DIAGNOSIS — Z7984 Long term (current) use of oral hypoglycemic drugs: Secondary | ICD-10-CM | POA: Insufficient documentation

## 2018-10-28 DIAGNOSIS — Z79899 Other long term (current) drug therapy: Secondary | ICD-10-CM | POA: Insufficient documentation

## 2018-10-28 DIAGNOSIS — Z87891 Personal history of nicotine dependence: Secondary | ICD-10-CM | POA: Diagnosis not present

## 2018-10-28 DIAGNOSIS — N8111 Cystocele, midline: Secondary | ICD-10-CM | POA: Diagnosis not present

## 2018-10-28 LAB — CBC
HCT: 41.8 % (ref 36.0–46.0)
Hemoglobin: 13.7 g/dL (ref 12.0–15.0)
MCH: 29.9 pg (ref 26.0–34.0)
MCHC: 32.8 g/dL (ref 30.0–36.0)
MCV: 91.3 fL (ref 80.0–100.0)
Platelets: 250 10*3/uL (ref 150–400)
RBC: 4.58 MIL/uL (ref 3.87–5.11)
RDW: 12.1 % (ref 11.5–15.5)
WBC: 5.9 10*3/uL (ref 4.0–10.5)
nRBC: 0 % (ref 0.0–0.2)

## 2018-10-28 LAB — PROTIME-INR
INR: 1 (ref 0.8–1.2)
Prothrombin Time: 13.5 seconds (ref 11.4–15.2)

## 2018-10-28 LAB — BASIC METABOLIC PANEL
Anion gap: 8 (ref 5–15)
BUN: 18 mg/dL (ref 8–23)
CO2: 26 mmol/L (ref 22–32)
Calcium: 9.6 mg/dL (ref 8.9–10.3)
Chloride: 106 mmol/L (ref 98–111)
Creatinine, Ser: 0.78 mg/dL (ref 0.44–1.00)
GFR calc Af Amer: >60 mL/min (ref >60)
GFR calc non Af Amer: >60 mL/min (ref >60)
Glucose, Bld: 93 mg/dL (ref 70–99)
Potassium: 4.2 mmol/L (ref 3.5–5.1)
Sodium: 140 mmol/L (ref 135–145)

## 2018-10-28 LAB — GLUCOSE, CAPILLARY: Glucose-Capillary: 76 mg/dL (ref 70–99)

## 2018-10-28 NOTE — H&P (Signed)
I was consulted by the above provider to assess the patient's prolapse worsening over a few months. She has felt her bladder drop and sometimes she reduce it. She described recent failed pessary   Sometimes she has urgency has to run to the toilet but does not wear a pad. She does have mild-to-moderate bedwetting and no stress incontinence   She voids every 2 hours and gets up 3 times a night depending on fluid intake. Flow sometimes good sometimes she presses on her lower abdomen. Sometimes she stops and starts   She has had a hysterectomy.   On pelvic examination the patient had a small grade 3 cystocele with central defect that descended to the introitus. Vaginal cuff to sent from 8 or 9 cm to approximately 5 cm. She had a mild rectocele. Well-supported bladder neck and no stress incontinence with cystocele reduced   Patient has urge incontinence and fluid dependent frequency and moderate nocturia. She has mild to moderate bedwetting. The role of urodynamics discussed for her incontinence and prolapse symptoms. Pictures drawn. If the patient ever had surgery she would likely best benefit from a transvaginal vault suspension with cystocele repair and graft. She would not need a rectocele repair. She had mild narrowing of the vaginal vault but I believe the procedure could be done transvaginally.   Today  Frequency stable. Incontinence stable.  Patient did not void and was catheterized for 100 mL during urodynamics. Maximum bladder capacity was 1000 mL. Bladder was hypo sensitive. Bladder is stable. At 500 mL she leaked a mild amount has 64 cm water. At 750 mL she leaked a mild amount is low is 36 cm water. It took her a long time to try to void. It took her a long time to void. She had very low pressure contractions at refer leaking. The urodynamics line finally had to be removed. She voided also with abdominal straining. She noted that she often leans forward presses on her stomach to void. She  voided 767 mL with a maximum flow of 18 mL/second. Residual was 233 mL. She had a very prolonged pattern post uroflow. She had a modest cystocele fluoroscopically. She was not uncomfortable at all at 1 L. the details of the urodynamics or sign-in dictated   Picture was drawn. We discussed the transvaginal vault suspension with cystocele repair and graft. We talked about a pessary watchful waiting period she understands that she has mild stress incontinence that was occult but I do not recommend a sling especially with her voiding characteristics and large capacity bladder and history of lack of stress incontinence. She understands bedwetting due to bladder overactivity. Sequelae with worsening stress incontinence and return for occasion discussed even severity. I think urethral injectable would be an excellent for stress incontinence if it is on mast but I will not do at the time of the procedure.   Patient understands concept of her incontinence versus prolapse surgery. She like to proceed. Mesh issue described. She is not sexually active. Her incontinence afterwards can be treated depending on type with medication or injectable   She said her primary care has noted that she has lot of issues with pain or tolerating pain and SOB taking account around the time of surgery     ALLERGIES: Benadryl Sodium Chloride TABS    MEDICATIONS: CeleXA 40 MG Oral Tablet Oral  Cipro 500 MG Oral Tablet Oral  Enalapril Maleate 10 MG Oral Tablet Oral  Fish Oil CAPS Oral  Flexeril 10 MG TABS  Oral  Hydrocodone-Acetaminophen 10-325 MG/15ML Oral Solution Oral  Lasix 40 MG Oral Tablet Oral  MetFORMIN HCl - 500 MG Oral Tablet Oral  TraMADol HCl - 50 MG Oral Tablet Oral  Vitamin D3 1000 UNIT Oral Capsule Oral     GU PSH: Complex cystometrogram, w/ void pressure and urethral pressure profile studies, any technique - 09/30/2018 Complex Uroflow - 09/30/2018 Emg surf Electrd - 09/30/2018 Hysterectomy Unilat SO -  2010 Inject For cystogram - 09/30/2018 Intrabd voidng Press - 09/30/2018       PSH Notes: Total Hip Replacement, Hysterectomy   NON-GU PSH: Total Hip Replacement - 2010     GU PMH: Nocturnal Enuresis - 09/30/2018 Cystocele, midline - 09/14/2018 Nocturia - 09/14/2018 Urge incontinence - 09/14/2018 Urinary Frequency - 09/14/2018 Urinary Retention, Unspec, Urinary retention - 2014      PMH Notes:  1898-01-12 00:00:00 - Note: Normal Routine History And Physical Adult  2008-10-03 14:21:49 - Note: Arthritis   NON-GU PMH: Personal history of other diseases of the circulatory system, History of hypertension - 2014 Personal history of other diseases of the nervous system and sense organs, History of sleep apnea - 2014 Personal history of other endocrine, nutritional and metabolic disease, History of diabetes mellitus - 2014, History of hypercholesterolemia, - 2014    FAMILY HISTORY: Family Health Status Number - Runs In Family Father Deceased At Age5 ___ - Runs In Family Mother Deceased At Age 77 from diabetic complicati - Runs In Family   SOCIAL HISTORY: None    Notes: Caffeine Use, Marital History - Currently Married, Alcohol Use, Occupation:   REVIEW OF SYSTEMS:    GU Review Female:   Patient denies stream starts and stops, being pregnant, leakage of urine, get up at night to urinate, frequent urination, hard to postpone urination, trouble starting your stream, burning /pain with urination, and have to strain to urinate.  Gastrointestinal (Upper):   Patient denies nausea, vomiting, and indigestion/ heartburn.  Gastrointestinal (Lower):   Patient denies diarrhea and constipation.  Constitutional:   Patient denies fever, night sweats, weight loss, and fatigue.  Skin:   Patient denies skin rash/ lesion and itching.  Eyes:   Patient denies blurred vision and double vision.  Ears/ Nose/ Throat:   Patient denies sore throat and sinus problems.  Hematologic/Lymphatic:   Patient denies swollen  glands and easy bruising.  Cardiovascular:   Patient denies leg swelling and chest pains.  Respiratory:   Patient denies cough and shortness of breath.  Endocrine:   Patient denies excessive thirst.  Musculoskeletal:   Patient denies back pain and joint pain.  Neurological:   Patient denies headaches and dizziness.  Psychologic:   Patient denies depression and anxiety.   Notes: UDS follow up    VITAL SIGNS:      10/14/2018 10:46 AM  Weight 143.2 lb / 64.95 kg  Height 63 in / 160.02 cm  BP 138/73 mmHg  Pulse 83 /min  Temperature 98.0 F / 36.6 C  BMI 25.4 kg/m   PAST DATA REVIEWED:  Source Of History:  Patient   PROCEDURES:          Urinalysis w/Scope Dipstick Dipstick Cont'd Micro  Color: Yellow Bilirubin: Neg mg/dL WBC/hpf: NS (Not Seen)  Appearance: Clear Ketones: Neg mg/dL RBC/hpf: 0 - 2/hpf  Specific Gravity: 1.015 Blood: 1+ ery/uL Bacteria: NS (Not Seen)  pH: <=5.0 Protein: Neg mg/dL Cystals: NS (Not Seen)  Glucose: Neg mg/dL Urobilinogen: 0.2 mg/dL Casts: NS (Not Seen)    Nitrites:  Neg Trichomonas: Not Present    Leukocyte Esterase: Neg leu/uL Mucous: Not Present      Epithelial Cells: NS (Not Seen)      Yeast: NS (Not Seen)      Sperm: Not Present    ASSESSMENT:      ICD-10 Details  1 GU:   Cystocele, midline - N81.11   2   Urge incontinence - N39.41               Notes:   I drew her a picture and we talked about prolapse surgery in detail. Pros, cons, general surgical and anesthetic risks, and other options including behavioral therapy, pessaries, and watchful waiting were discussed. She understands that prolapse repairs are successful in 80-85% of cases for prolapse symptoms and can recur anteriorly, posteriorly, and/or apically. She understands that in most cases I use a graft and general risks were discussed. Surgical risks were described but not limited to the discussion of injury to neighboring structures including the bowel (with possible life-threatening  sepsis and colostomy), bladder, urethra, vagina (all resulting in further surgery), and ureter (resulting in re-implantation). We talked about injury to nerves/soft tissue leading to debilitating and intractable pelvic, abdominal, and lower extremity pain syndromes and neuropathies. The risks of buttock pain, intractable dyspareunia, and vaginal narrowing and shortening with sequelae were discussed. Bleeding risks, transfusion rates, and infection were discussed. The risk of persistent, de novo, or worsening bladder and/or bowel incontinence/dysfunction was discussed. The need for CIC was described as well the usual post-operative course. The patient understands that she might not reach her treatment goal and that she might be worse following surgery.    PLAN:           Schedule Return Visit/Planned Activity: Return PRN - Office Visit   After a thorough review of the management options for the patient's condition the patient  elected to proceed with surgical therapy as noted above. We have discussed the potential benefits and risks of the procedure, side effects of the proposed treatment, the likelihood of the patient achieving the goals of the procedure, and any potential problems that might occur during the procedure or recuperation. Informed consent has been obtained.

## 2018-10-29 LAB — NOVEL CORONAVIRUS, NAA (HOSP ORDER, SEND-OUT TO REF LAB; TAT 18-24 HRS): SARS-CoV-2, NAA: NOT DETECTED

## 2018-10-29 LAB — ABO/RH: ABO/RH(D): A POS

## 2018-10-31 MED ORDER — GENTAMICIN SULFATE 40 MG/ML IJ SOLN
5.0000 mg/kg | INTRAVENOUS | Status: AC
  Administered 2018-11-01: 320 mg via INTRAVENOUS
  Filled 2018-10-31: qty 8

## 2018-10-31 NOTE — Anesthesia Preprocedure Evaluation (Addendum)
Anesthesia Evaluation  Patient identified by MRN, date of birth, ID band Patient awake    Reviewed: Allergy & Precautions, H&P , NPO status , Patient's Chart, lab work & pertinent test results, reviewed documented beta blocker date and time   Airway Mallampati: II  TM Distance: >3 FB Neck ROM: full    Dental  (+) Teeth Intact, Dental Advidsory Given, Caps   Pulmonary sleep apnea and Continuous Positive Airway Pressure Ventilation , former smoker,  Former smoker 50 pack years, quit 1985   breath sounds clear to auscultation       Cardiovascular hypertension, On Medications and Pt. on medications + Valvular Problems/Murmurs MR  Rhythm:regular  Echo 2017: - Left ventricle: The cavity size was normal. Systolic function was   normal. The estimated ejection fraction was in the range of 55%   to 60%. Wall motion was normal; there were no regional wall   motion abnormalities. Left ventricular diastolic function   parameters were normal. - Mitral valve: Calcified annulus. There was mild to moderate   regurgitation directed centrally.   Neuro/Psych  Headaches, PSYCHIATRIC DISORDERS Anxiety Depression  Neuromuscular disease    GI/Hepatic negative GI ROS, Neg liver ROS,   Endo/Other  diabetes, Type 2, Oral Hypoglycemic Agents  Renal/GU negative Renal ROS  negative genitourinary   Musculoskeletal  (+) Arthritis , Osteoarthritis,    Abdominal Normal abdominal exam  (+)   Peds  Hematology negative hematology ROS (+)   Anesthesia Other Findings Took ativan 2mg  at 4:30am States she has very low tolerance to pain, requires high dose pain meds with any procedure  Reproductive/Obstetrics negative OB ROS                            Anesthesia Physical  Anesthesia Plan  ASA: III  Anesthesia Plan: General   Post-op Pain Management:    Induction: Intravenous  PONV Risk Score and Plan: 3 and Ondansetron,  Dexamethasone and Treatment may vary due to age or medical condition  Airway Management Planned: Oral ETT  Additional Equipment: None  Intra-op Plan:   Post-operative Plan: Extubation in OR  Informed Consent: I have reviewed the patients History and Physical, chart, labs and discussed the procedure including the risks, benefits and alternatives for the proposed anesthesia with the patient or authorized representative who has indicated his/her understanding and acceptance.     Dental Advisory Given and Dental advisory given  Plan Discussed with: CRNA  Anesthesia Plan Comments:        Anesthesia Quick Evaluation

## 2018-10-31 NOTE — Progress Notes (Signed)
HGA1C ordered at PAT appt, however order was not released. HGA1C to be obtained on day of surgery.

## 2018-11-01 ENCOUNTER — Encounter (HOSPITAL_COMMUNITY): Payer: Self-pay

## 2018-11-01 ENCOUNTER — Other Ambulatory Visit: Payer: Self-pay

## 2018-11-01 ENCOUNTER — Ambulatory Visit (HOSPITAL_COMMUNITY): Payer: Medicare HMO | Admitting: Certified Registered Nurse Anesthetist

## 2018-11-01 ENCOUNTER — Encounter (HOSPITAL_COMMUNITY)
Admission: RE | Disposition: A | Discharge status: Home / Self Care | Payer: Self-pay | Source: Home / Self Care | Attending: Urology

## 2018-11-01 ENCOUNTER — Observation Stay (HOSPITAL_COMMUNITY)
Admission: RE | Admit: 2018-11-01 | Discharge: 2018-11-02 | Disposition: A | Discharge status: Home / Self Care | Payer: Medicare HMO | Attending: Urology | Admitting: Urology

## 2018-11-01 DIAGNOSIS — Z886 Allergy status to analgesic agent status: Secondary | ICD-10-CM | POA: Diagnosis not present

## 2018-11-01 DIAGNOSIS — Z87891 Personal history of nicotine dependence: Secondary | ICD-10-CM | POA: Diagnosis not present

## 2018-11-01 DIAGNOSIS — N3941 Urge incontinence: Secondary | ICD-10-CM | POA: Diagnosis not present

## 2018-11-01 DIAGNOSIS — I10 Essential (primary) hypertension: Secondary | ICD-10-CM | POA: Insufficient documentation

## 2018-11-01 DIAGNOSIS — G4730 Sleep apnea, unspecified: Secondary | ICD-10-CM | POA: Insufficient documentation

## 2018-11-01 DIAGNOSIS — Z7984 Long term (current) use of oral hypoglycemic drugs: Secondary | ICD-10-CM | POA: Diagnosis not present

## 2018-11-01 DIAGNOSIS — Z79899 Other long term (current) drug therapy: Secondary | ICD-10-CM | POA: Diagnosis not present

## 2018-11-01 DIAGNOSIS — Z888 Allergy status to other drugs, medicaments and biological substances status: Secondary | ICD-10-CM | POA: Diagnosis not present

## 2018-11-01 DIAGNOSIS — N8111 Cystocele, midline: Principal | ICD-10-CM | POA: Insufficient documentation

## 2018-11-01 DIAGNOSIS — M1990 Unspecified osteoarthritis, unspecified site: Secondary | ICD-10-CM | POA: Diagnosis not present

## 2018-11-01 DIAGNOSIS — N814 Uterovaginal prolapse, unspecified: Secondary | ICD-10-CM | POA: Diagnosis present

## 2018-11-01 DIAGNOSIS — E119 Type 2 diabetes mellitus without complications: Secondary | ICD-10-CM | POA: Diagnosis not present

## 2018-11-01 HISTORY — PX: CYSTOCELE REPAIR: SHX163

## 2018-11-01 LAB — GLUCOSE, CAPILLARY
Glucose-Capillary: 75 mg/dL (ref 70–99)
Glucose-Capillary: 111 mg/dL — ABNORMAL HIGH (ref 70–99)
Glucose-Capillary: 112 mg/dL — ABNORMAL HIGH (ref 70–99)
Glucose-Capillary: 112 mg/dL — ABNORMAL HIGH (ref 70–99)
Glucose-Capillary: 123 mg/dL — ABNORMAL HIGH (ref 70–99)

## 2018-11-01 LAB — TYPE AND SCREEN
ABO/RH(D): A POS
Antibody Screen: NEGATIVE

## 2018-11-01 LAB — HEMOGLOBIN AND HEMATOCRIT, BLOOD
HCT: 36.8 % (ref 36.0–46.0)
Hemoglobin: 11.8 g/dL — ABNORMAL LOW (ref 12.0–15.0)

## 2018-11-01 LAB — HEMOGLOBIN A1C
Hgb A1c MFr Bld: 5.3 % (ref 4.8–5.6)
Mean Plasma Glucose: 105.41 mg/dL

## 2018-11-01 SURGERY — COLPORRHAPHY, ANTERIOR, FOR CYSTOCELE REPAIR
Anesthesia: General

## 2018-11-01 MED ORDER — PHENYLEPHRINE HCL (PRESSORS) 10 MG/ML IV SOLN
INTRAVENOUS | Status: AC
  Filled 2018-11-01: qty 1

## 2018-11-01 MED ORDER — ROCURONIUM BROMIDE 10 MG/ML (PF) SYRINGE
PREFILLED_SYRINGE | INTRAVENOUS | Status: AC
  Filled 2018-11-01: qty 10

## 2018-11-01 MED ORDER — DIPHENHYDRAMINE HCL 12.5 MG/5ML PO ELIX: 12.5000 mg | ORAL_SOLUTION | Freq: Four times a day (QID) | ORAL | Status: DC | PRN

## 2018-11-01 MED ORDER — ACETAMINOPHEN 500 MG PO TABS
1000.0000 mg | ORAL_TABLET | Freq: Once | ORAL | Status: AC
  Administered 2018-11-01: 1000 mg via ORAL
  Filled 2018-11-01: qty 2

## 2018-11-01 MED ORDER — LIDOCAINE-EPINEPHRINE (PF) 1 %-1:200000 IJ SOLN
INTRAMUSCULAR | Status: DC | PRN
  Administered 2018-11-01: 20 mL

## 2018-11-01 MED ORDER — DEXAMETHASONE SODIUM PHOSPHATE 10 MG/ML IJ SOLN
INTRAMUSCULAR | Status: AC
  Filled 2018-11-01: qty 1

## 2018-11-01 MED ORDER — CLINDAMYCIN PHOSPHATE 2 % VA CREA
TOPICAL_CREAM | VAGINAL | Status: AC
  Filled 2018-11-01: qty 80

## 2018-11-01 MED ORDER — FENTANYL CITRATE (PF) 100 MCG/2ML IJ SOLN: 25.0000 ug | INTRAMUSCULAR | Status: DC | PRN

## 2018-11-01 MED ORDER — ROCURONIUM BROMIDE 50 MG/5ML IV SOSY
PREFILLED_SYRINGE | INTRAVENOUS | Status: DC | PRN
  Administered 2018-11-01 (×2): 10 mg via INTRAVENOUS
  Administered 2018-11-01: 50 mg via INTRAVENOUS

## 2018-11-01 MED ORDER — SUGAMMADEX SODIUM 200 MG/2ML IV SOLN
INTRAVENOUS | Status: DC | PRN
  Administered 2018-11-01: 130 mg via INTRAVENOUS

## 2018-11-01 MED ORDER — EPHEDRINE 5 MG/ML INJ
INTRAVENOUS | Status: AC
  Filled 2018-11-01: qty 10

## 2018-11-01 MED ORDER — INSULIN ASPART 100 UNIT/ML ~~LOC~~ SOLN: 0.0000 [IU] | Freq: Three times a day (TID) | SUBCUTANEOUS | Status: DC

## 2018-11-01 MED ORDER — SODIUM CHLORIDE 0.9 % IV SOLN
INTRAVENOUS | Status: DC | PRN
  Administered 2018-11-01: 500 mL

## 2018-11-01 MED ORDER — PROPOFOL 10 MG/ML IV BOLUS
INTRAVENOUS | Status: AC
  Filled 2018-11-01: qty 20

## 2018-11-01 MED ORDER — PHENYLEPHRINE 40 MCG/ML (10ML) SYRINGE FOR IV PUSH (FOR BLOOD PRESSURE SUPPORT)
PREFILLED_SYRINGE | INTRAVENOUS | Status: AC
  Filled 2018-11-01: qty 10

## 2018-11-01 MED ORDER — ONDANSETRON HCL 4 MG/2ML IJ SOLN
4.0000 mg | INTRAMUSCULAR | Status: DC | PRN
  Administered 2018-11-01: 4 mg via INTRAVENOUS
  Filled 2018-11-01: qty 2

## 2018-11-01 MED ORDER — SODIUM CHLORIDE (PF) 0.9 % IJ SOLN
INTRAMUSCULAR | Status: AC
  Filled 2018-11-01: qty 10

## 2018-11-01 MED ORDER — CLINDAMYCIN PHOSPHATE 600 MG/50ML IV SOLN
600.0000 mg | INTRAVENOUS | Status: AC
  Administered 2018-11-01: 600 mg via INTRAVENOUS
  Filled 2018-11-01: qty 50

## 2018-11-01 MED ORDER — POTASSIUM CHLORIDE ER 10 MEQ PO TBCR
10.0000 meq | EXTENDED_RELEASE_TABLET | Freq: Two times a day (BID) | ORAL | Status: DC
  Administered 2018-11-01 – 2018-11-02 (×3): 10 meq via ORAL
  Filled 2018-11-01 (×6): qty 1

## 2018-11-01 MED ORDER — DEXAMETHASONE SODIUM PHOSPHATE 10 MG/ML IJ SOLN
INTRAMUSCULAR | Status: DC | PRN
  Administered 2018-11-01: 4 mg via INTRAVENOUS

## 2018-11-01 MED ORDER — OXYBUTYNIN CHLORIDE 5 MG PO TABS: 5.0000 mg | ORAL_TABLET | Freq: Three times a day (TID) | ORAL | Status: DC | PRN

## 2018-11-01 MED ORDER — ONDANSETRON HCL 4 MG/2ML IJ SOLN
INTRAMUSCULAR | Status: AC
  Filled 2018-11-01: qty 2

## 2018-11-01 MED ORDER — EPHEDRINE SULFATE-NACL 50-0.9 MG/10ML-% IV SOSY
PREFILLED_SYRINGE | INTRAVENOUS | Status: DC | PRN
  Administered 2018-11-01: 10 mg via INTRAVENOUS

## 2018-11-01 MED ORDER — FENTANYL CITRATE (PF) 100 MCG/2ML IJ SOLN
INTRAMUSCULAR | Status: DC | PRN
  Administered 2018-11-01 (×3): 50 ug via INTRAVENOUS

## 2018-11-01 MED ORDER — ACETAMINOPHEN 325 MG PO TABS
650.0000 mg | ORAL_TABLET | ORAL | Status: DC | PRN
  Administered 2018-11-01 (×2): 650 mg via ORAL
  Filled 2018-11-01 (×2): qty 2

## 2018-11-01 MED ORDER — SODIUM CHLORIDE 0.9 % IV SOLN
INTRAVENOUS | Status: DC | PRN
  Administered 2018-11-01: 30 ug/min via INTRAVENOUS

## 2018-11-01 MED ORDER — OXYCODONE HCL 5 MG PO TABS
5.0000 mg | ORAL_TABLET | ORAL | Status: DC | PRN
  Administered 2018-11-01 (×2): 5 mg via ORAL
  Filled 2018-11-01 (×2): qty 1

## 2018-11-01 MED ORDER — PROPOFOL 10 MG/ML IV BOLUS
INTRAVENOUS | Status: DC | PRN
  Administered 2018-11-01: 150 mg via INTRAVENOUS

## 2018-11-01 MED ORDER — LIDOCAINE 2% (20 MG/ML) 5 ML SYRINGE
INTRAMUSCULAR | Status: AC
  Filled 2018-11-01: qty 5

## 2018-11-01 MED ORDER — WATER FOR IRRIGATION, STERILE IR SOLN
Status: DC | PRN
  Administered 2018-11-01: 3000 mL

## 2018-11-01 MED ORDER — PHENAZOPYRIDINE HCL 200 MG PO TABS
200.0000 mg | ORAL_TABLET | ORAL | Status: AC
  Administered 2018-11-01: 200 mg via ORAL
  Filled 2018-11-01: qty 1

## 2018-11-01 MED ORDER — PHENYLEPHRINE 40 MCG/ML (10ML) SYRINGE FOR IV PUSH (FOR BLOOD PRESSURE SUPPORT)
PREFILLED_SYRINGE | INTRAVENOUS | Status: DC | PRN
  Administered 2018-11-01 (×2): 80 ug via INTRAVENOUS
  Administered 2018-11-01: 40 ug via INTRAVENOUS
  Administered 2018-11-01 (×6): 80 ug via INTRAVENOUS

## 2018-11-01 MED ORDER — HYDROMORPHONE HCL 2 MG/ML IJ SOLN
INTRAMUSCULAR | Status: AC
  Filled 2018-11-01: qty 1

## 2018-11-01 MED ORDER — SODIUM CHLORIDE 0.45 % IV SOLN
INTRAVENOUS | Status: DC
  Administered 2018-11-01 – 2018-11-02 (×2): via INTRAVENOUS

## 2018-11-01 MED ORDER — DOCUSATE SODIUM 100 MG PO CAPS
100.0000 mg | ORAL_CAPSULE | Freq: Two times a day (BID) | ORAL | Status: DC
  Administered 2018-11-01 – 2018-11-02 (×2): 100 mg via ORAL
  Filled 2018-11-01 (×2): qty 1

## 2018-11-01 MED ORDER — PROMETHAZINE HCL 25 MG/ML IJ SOLN: 6.2500 mg | INTRAMUSCULAR | Status: DC | PRN

## 2018-11-01 MED ORDER — MORPHINE SULFATE (PF) 4 MG/ML IV SOLN: 2.0000 mg | INTRAVENOUS | Status: DC | PRN

## 2018-11-01 MED ORDER — OXYCODONE-ACETAMINOPHEN 5-325 MG PO TABS: 1.0000 | ORAL_TABLET | Freq: Four times a day (QID) | ORAL | 0 refills | Status: DC | PRN

## 2018-11-01 MED ORDER — LIDOCAINE 2% (20 MG/ML) 5 ML SYRINGE
INTRAMUSCULAR | Status: DC | PRN
  Administered 2018-11-01: 60 mg via INTRAVENOUS

## 2018-11-01 MED ORDER — ONDANSETRON HCL 4 MG/2ML IJ SOLN
INTRAMUSCULAR | Status: DC | PRN
  Administered 2018-11-01: 4 mg via INTRAVENOUS

## 2018-11-01 MED ORDER — SODIUM CHLORIDE 0.9 % IV SOLN
INTRAVENOUS | Status: AC
  Filled 2018-11-01: qty 500000

## 2018-11-01 MED ORDER — LORAZEPAM 1 MG PO TABS
1.0000 mg | ORAL_TABLET | Freq: Two times a day (BID) | ORAL | Status: DC | PRN
  Administered 2018-11-01: 1 mg via ORAL
  Filled 2018-11-01: qty 1

## 2018-11-01 MED ORDER — CLINDAMYCIN PHOSPHATE 2 % VA CREA
TOPICAL_CREAM | VAGINAL | Status: DC | PRN
  Administered 2018-11-01: 1 via VAGINAL

## 2018-11-01 MED ORDER — HYDROMORPHONE HCL 1 MG/ML IJ SOLN
INTRAMUSCULAR | Status: DC | PRN
  Administered 2018-11-01 (×2): 0.5 mg via INTRAVENOUS

## 2018-11-01 MED ORDER — LACTATED RINGERS IV SOLN
INTRAVENOUS | Status: DC
  Administered 2018-11-01 (×2): via INTRAVENOUS

## 2018-11-01 MED ORDER — FENTANYL CITRATE (PF) 250 MCG/5ML IJ SOLN
INTRAMUSCULAR | Status: AC
  Filled 2018-11-01: qty 5

## 2018-11-01 MED ORDER — DIPHENHYDRAMINE HCL 50 MG/ML IJ SOLN: 12.5000 mg | Freq: Four times a day (QID) | INTRAMUSCULAR | Status: DC | PRN

## 2018-11-01 MED ORDER — LIDOCAINE-EPINEPHRINE (PF) 1 %-1:200000 IJ SOLN
INTRAMUSCULAR | Status: AC
  Filled 2018-11-01: qty 30

## 2018-11-01 SURGICAL SUPPLY — 55 items
BAG DECANTER FOR FLEXI CONT (MISCELLANEOUS) ×2 IMPLANT
BAG URINE DRAINAGE (UROLOGICAL SUPPLIES) ×1 IMPLANT
BLADE SURG 15 STRL LF DISP TIS (BLADE) ×1 IMPLANT
BLADE SURG 15 STRL SS (BLADE) ×2
BLADE SURG SZ10 CARB STEEL (BLADE) IMPLANT
BRIEF STRETCH FOR OB PAD LRG (UNDERPADS AND DIAPERS) ×2 IMPLANT
CATH FOLEY 2WAY SLVR  5CC 14FR (CATHETERS) ×1
CATH FOLEY 2WAY SLVR 5CC 14FR (CATHETERS) ×1 IMPLANT
COVER MAYO STAND STRL (DRAPES) ×2 IMPLANT
COVER WAND RF STERILE (DRAPES) IMPLANT
DECANTER SPIKE VIAL GLASS SM (MISCELLANEOUS) ×2 IMPLANT
DEVICE CAPIO SLIM SINGLE (INSTRUMENTS) IMPLANT
DRAIN PENROSE 18X1/4 LTX STRL (WOUND CARE) ×2 IMPLANT
DRAPE UNDERBUTTOCKS STRL (DISPOSABLE) ×2 IMPLANT
ELECT PENCIL ROCKER SW 15FT (MISCELLANEOUS) ×2 IMPLANT
GAUZE 4X4 16PLY RFD (DISPOSABLE) ×7 IMPLANT
GAUZE PACKING 1 X5 YD ST (GAUZE/BANDAGES/DRESSINGS) ×3 IMPLANT
GLOVE BIO SURGEON STRL SZ 6.5 (GLOVE) ×3 IMPLANT
GLOVE BIOGEL M STRL SZ7.5 (GLOVE) ×2 IMPLANT
GLOVE ECLIPSE 8.5 STRL (GLOVE) ×2 IMPLANT
GOWN STRL REUS W/TWL LRG LVL3 (GOWN DISPOSABLE) ×2 IMPLANT
GOWN STRL REUS W/TWL XL LVL3 (GOWN DISPOSABLE) ×2 IMPLANT
HOLDER FOLEY CATH W/STRAP (MISCELLANEOUS) ×2 IMPLANT
IV NS 1000ML (IV SOLUTION)
IV NS 1000ML BAXH (IV SOLUTION) ×1 IMPLANT
KIT BASIN OR (CUSTOM PROCEDURE TRAY) ×2 IMPLANT
KIT TURNOVER KIT A (KITS) ×1 IMPLANT
NDL MAYO 6 CRC TAPER PT (NEEDLE) ×1 IMPLANT
NEEDLE HYPO 22GX1.5 SAFETY (NEEDLE) ×2 IMPLANT
NEEDLE MAYO 6 CRC TAPER PT (NEEDLE) ×2 IMPLANT
NS IRRIG 1000ML POUR BTL (IV SOLUTION) ×1 IMPLANT
PACK CYSTO (CUSTOM PROCEDURE TRAY) ×2 IMPLANT
PAD OB MATERNITY 4.3X12.25 (PERSONAL CARE ITEMS) ×2 IMPLANT
PAD POSITIONING PINK XL (MISCELLANEOUS) ×2 IMPLANT
PLUG CATH AND CAP STER (CATHETERS) ×2 IMPLANT
RETRACTOR STAY HOOK 5MM (MISCELLANEOUS) ×2 IMPLANT
SHEET LAVH (DRAPES) ×2 IMPLANT
SUT CAPIO ETHIBPND (SUTURE) IMPLANT
SUT VIC AB 0 CT1 27 (SUTURE)
SUT VIC AB 0 CT1 27XBRD ANTBC (SUTURE) ×1 IMPLANT
SUT VIC AB 2-0 CT1 27 (SUTURE) ×2
SUT VIC AB 2-0 CT1 27XBRD (SUTURE) ×2 IMPLANT
SUT VIC AB 2-0 SH 27 (SUTURE) ×4
SUT VIC AB 2-0 SH 27X BRD (SUTURE) ×2 IMPLANT
SUT VIC AB 3-0 SH 27 (SUTURE) ×6
SUT VIC AB 3-0 SH 27XBRD (SUTURE) ×2 IMPLANT
SUT VICRYL 0 UR6 27IN ABS (SUTURE) ×4 IMPLANT
SYR 10ML LL (SYRINGE) ×2 IMPLANT
TOWEL OR 17X26 10 PK STRL BLUE (TOWEL DISPOSABLE) ×2 IMPLANT
TOWEL OR NON WOVEN STRL DISP B (DISPOSABLE) ×2 IMPLANT
TUBING CONNECTING 10 (TUBING) ×2 IMPLANT
UNDERPAD 30X36 HEAVY ABSORB (UNDERPADS AND DIAPERS) ×2 IMPLANT
WATER STERILE IRR 1000ML POUR (IV SOLUTION) ×1 IMPLANT
WATER STERILE IRR 500ML POUR (IV SOLUTION) ×1 IMPLANT
YANKAUER SUCT BULB TIP 10FT TU (MISCELLANEOUS) ×2 IMPLANT

## 2018-11-01 NOTE — Op Note (Signed)
Preoperative diagnosis: Midline cystocele mild vault prolapse Postoperative diagnosis: Midline cystocele and mild prolapse Surgery: Cystocele repair and cystoscopy Surgeon: Dr. Nicki Reaper Sawyer [place] Assistant: Debbrah Alar  The patient has the above diagnosis and consented to the above procedure.  Extra care was taken with leg positioning to minimize the risk of compartment syndrome and neuropathy and deep vein thrombosis.  Under anesthesia the vaginal cuff was reasonably well supported descended 3 cm.  She did not have a long vagina.  She had large grade 2 cystocele.  Antibiotics were given  I instilled 20 cc of a lidocaine epinephrine mixture.  I made a midline incision and had to shorten my retractor to get my Allis clamps near the apex.  I was very diligent marking the apex with 3-0 Vicryl a second 1 a little bit deeper that was just turning at the apex hoping to get her as much length as possible.  She had a short anterior vaginal wall.  Following a T-shaped incision I mobilized the underlying pubocervical fascia from the overlying vaginal epithelium to the white line bilaterally.  I was very diligent at the apex and mobilized as much as possibleto  give her as much length as I could  She had a small cystocele and I did a 2 layer repair and was I was very pleased with it using 2-0 vicryl. She had excellent support and length  I cystoscoped the patient and cystoscopically she had an excellent anterior repair with good efflux bilaterally and no distortion of the ureters  I had taken down the retractor and there is no question that her apex was well supported and she did not need a vault suspension.  I trimmed an appropriate amount of anterior vaginal wall and closed anterior vaginal wall with running 2-0 Vicryl on a CT1 needle.  Again length was excellent with very good reduction of the cystocele.  She had little to no posterior defect she had mild apical leak moderate  Blood loss less than 50 mL.  Leg  position good. Hopefully she reaches her treatment goal.

## 2018-11-01 NOTE — Anesthesia Postprocedure Evaluation (Signed)
Anesthesia Post Note  Patient: Tiffany Gibbs  Procedure(s) Performed: CYSTOSCOPY ANTERIOR REPAIR (CYSTOCELE) (N/A )     Patient location during evaluation: PACU Anesthesia Type: General Level of consciousness: awake and alert Pain management: pain level controlled Vital Signs Assessment: post-procedure vital signs reviewed and stable Respiratory status: spontaneous breathing, nonlabored ventilation, respiratory function stable and patient connected to nasal cannula oxygen Cardiovascular status: blood pressure returned to baseline and stable Postop Assessment: no apparent nausea or vomiting Anesthetic complications: no    Last Vitals:  Vitals:   11/01/18 1200 11/01/18 1303  BP: 117/63 133/68  Pulse: 90 82  Resp: 19 16  Temp:  (!) 36.4 C  SpO2: 100% 99%    Last Pain:  Vitals:   11/01/18 1309  TempSrc:   PainSc: 0-No pain                 Pervis Hocking

## 2018-11-01 NOTE — Transfer of Care (Signed)
Immediate Anesthesia Transfer of Care Note  Patient: Tiffany Gibbs  Procedure(s) Performed: CYSTOSCOPY ANTERIOR REPAIR (CYSTOCELE) (N/A )  Patient Location: PACU  Anesthesia Type:General  Level of Consciousness: drowsy and patient cooperative  Airway & Oxygen Therapy: Patient Spontanous Breathing and Patient connected to face mask oxygen  Post-op Assessment: Report given to RN and Post -op Vital signs reviewed and stable  Post vital signs: Reviewed and stable  Last Vitals:  Vitals Value Taken Time  BP 126/70 11/01/18 1016  Temp    Pulse 82 11/01/18 1018  Resp 12 11/01/18 1018  SpO2 100 % 11/01/18 1018  Vitals shown include unvalidated device data.  Last Pain:  Vitals:   11/01/18 0557  TempSrc: Oral         Complications: No apparent anesthesia complications

## 2018-11-01 NOTE — Anesthesia Procedure Notes (Signed)
Procedure Name: Intubation Date/Time: 11/01/2018 7:39 AM Performed by: Montel Clock, CRNA Pre-anesthesia Checklist: Patient identified, Emergency Drugs available, Suction available, Patient being monitored and Timeout performed Patient Re-evaluated:Patient Re-evaluated prior to induction Oxygen Delivery Method: Circle system utilized Preoxygenation: Pre-oxygenation with 100% oxygen Induction Type: IV induction Ventilation: Mask ventilation without difficulty Laryngoscope Size: Mac and 3 Grade View: Grade II Tube type: Oral Tube size: 7.0 mm Number of attempts: 1 Airway Equipment and Method: Stylet Placement Confirmation: ETT inserted through vocal cords under direct vision,  positive ETCO2 and breath sounds checked- equal and bilateral Secured at: 21 cm Tube secured with: Tape Dental Injury: Teeth and Oropharynx as per pre-operative assessment

## 2018-11-01 NOTE — Interval H&P Note (Signed)
History and Physical Interval Note:  11/01/2018 7:11 AM  Tiffany Gibbs  has presented today for surgery, with the diagnosis of CYSTOCELE VAULT PROLAPSE.  The various methods of treatment have been discussed with the patient and family. After consideration of risks, benefits and other options for treatment, the patient has consented to  Procedure(s): CYSTOSCOPY ANTERIOR REPAIR (CYSTOCELE) (N/A) VAGINAL VAULT SUSPENSION (N/A) as a surgical intervention.  The patient's history has been reviewed, patient examined, no change in status, stable for surgery.  I have reviewed the patient's chart and labs.  Questions were answered to the patient's satisfaction.     Telicia Hodgkiss A Kyilee Gregg

## 2018-11-02 ENCOUNTER — Encounter (HOSPITAL_COMMUNITY): Payer: Self-pay | Admitting: Urology

## 2018-11-02 ENCOUNTER — Telehealth: Payer: Self-pay | Admitting: *Deleted

## 2018-11-02 DIAGNOSIS — N8111 Cystocele, midline: Secondary | ICD-10-CM | POA: Diagnosis not present

## 2018-11-02 LAB — BASIC METABOLIC PANEL
Anion gap: 9 (ref 5–15)
BUN: 9 mg/dL (ref 8–23)
CO2: 22 mmol/L (ref 22–32)
Calcium: 8.8 mg/dL — ABNORMAL LOW (ref 8.9–10.3)
Chloride: 104 mmol/L (ref 98–111)
Creatinine, Ser: 0.72 mg/dL (ref 0.44–1.00)
GFR calc Af Amer: >60 mL/min (ref >60)
GFR calc non Af Amer: >60 mL/min (ref >60)
Glucose, Bld: 117 mg/dL — ABNORMAL HIGH (ref 70–99)
Potassium: 4 mmol/L (ref 3.5–5.1)
Sodium: 135 mmol/L (ref 135–145)

## 2018-11-02 LAB — GLUCOSE, CAPILLARY: Glucose-Capillary: 108 mg/dL — ABNORMAL HIGH (ref 70–99)

## 2018-11-02 LAB — HEMOGLOBIN AND HEMATOCRIT, BLOOD
HCT: 36.3 % (ref 36.0–46.0)
Hemoglobin: 11.8 g/dL — ABNORMAL LOW (ref 12.0–15.0)

## 2018-11-02 NOTE — Plan of Care (Signed)
Patient will get out of bed twice daily.

## 2018-11-02 NOTE — Progress Notes (Signed)
Looks good No pain Vitals and labs good  Detail post op

## 2018-11-02 NOTE — Progress Notes (Signed)
Discharge instructions reviewed. Questions concerns denied. No change from am assessment.

## 2018-11-02 NOTE — Progress Notes (Signed)
Patient voided 600 cc, Post Void Residual 0 cc. Urology provider contacted with update. Provider request to proceed with discharge

## 2018-11-02 NOTE — Telephone Encounter (Signed)
Pt was on TCM report admitted 11/01/18 for midline cystocele. Pt underwent a cystocele repair and cysto with excellent post op course. Pt D/C 11/02/18, and will follow-up w/urologist../lmb

## 2018-11-02 NOTE — Discharge Summary (Signed)
Date of admission: 11/01/2018  Date of discharge: 11/02/2018  Admission diagnosis: midline cystocele  Discharge diagnosis: midline cystocele  Secondary diagnoses: mild vault prolapse  History and Physical: For full details, please see admission history and physical. Briefly, Tiffany Gibbs is a 73 y.o. year old patient with the above diagnosis.   Hospital Course: cystocele repair and cysto with excellent post op course  Laboratory values:  Recent Labs    11/01/18 1609 11/02/18 0522  HGB 11.8* 11.8*  HCT 36.8 36.3   Recent Labs    11/02/18 0522  CREATININE 0.72    Disposition: Home  Discharge instruction: The patient was instructed to be ambulatory but told to refrain from heavy lifting, strenuous activity, or driving. detailed  Discharge medications:  Allergies as of 11/02/2018      Reactions   Celecoxib    swell   Enalapril    cough   Losartan    falls   Lovastatin    REACTION: aches   Naproxen    Pravachol [pravastatin Sodium]    Nausea   Zetia [ezetimibe]    Myalgias in LEs      Medication List    STOP taking these medications   B-12 2500 MCG Subl   Biotin 10000 MCG Tabs   [employer] ADV PROBIOTIC GUMMIES PO   Fish Oil 1200 MG Caps   NON FORMULARY   Vitamin D (Ergocalciferol) 1.25 MG (50000 UT) Caps capsule Commonly known as: DRISDOL     TAKE these medications   Benadryl Allergy 25 mg capsule Generic drug: diphenhydrAMINE Take 25 mg by mouth daily as needed (allergies).   fluticasone 50 MCG/ACT nasal spray Commonly known as: FLONASE Place 1 spray into both nostrils daily as needed for allergies or rhinitis.   LORazepam 1 MG tablet Commonly known as: ATIVAN Take 1 tablet (1 mg total) by mouth 2 (two) times daily as needed for anxiety.   metFORMIN 500 MG tablet Commonly known as: GLUCOPHAGE Take 1 tablet (500 mg total) by mouth 2 (two) times daily with a meal.   oxyCODONE-acetaminophen 5-325 MG tablet Commonly known as:  PERCOCET/ROXICET Take 1-2 tablets by mouth every 6 (six) hours as needed for moderate pain or severe pain. What changed:   how much to take  when to take this  reasons to take this   phentermine 37.5 MG tablet Commonly known as: ADIPEX-P TAKE ONE TABLET BY MOUTH ONE TIME DAILY  before breakfast What changed: See the new instructions.   potassium chloride 8 MEQ tablet Commonly known as: KLOR-CON Take 1 tablet (8 mEq total) by mouth 2 (two) times daily.       Followup:  Follow-up Information    Jene Oravec, Nicki Reaper, MD.   Specialty: Urology Why: office will call you with date and time of appt.  Contact information: [place]  [phone]

## 2018-11-02 NOTE — Discharge Instructions (Signed)
I have reviewed discharge instructions in detail with the patient. They will follow-up with me or their physician as scheduled. My nurse will also be calling the patients as per protocol. As discussed with Dr. Bernita Beckstrom. ° °You may resume aspirin, advil, aleve, vitamins, and supplements 7 days after surgery. °

## 2018-11-07 DIAGNOSIS — G4733 Obstructive sleep apnea (adult) (pediatric): Secondary | ICD-10-CM | POA: Diagnosis not present

## 2018-11-10 ENCOUNTER — Encounter: Payer: Self-pay | Admitting: Sports Medicine

## 2018-11-10 ENCOUNTER — Ambulatory Visit (INDEPENDENT_AMBULATORY_CARE_PROVIDER_SITE_OTHER): Payer: Medicare HMO | Admitting: Sports Medicine

## 2018-11-10 ENCOUNTER — Other Ambulatory Visit: Payer: Self-pay

## 2018-11-10 DIAGNOSIS — E1142 Type 2 diabetes mellitus with diabetic polyneuropathy: Secondary | ICD-10-CM | POA: Diagnosis not present

## 2018-11-10 DIAGNOSIS — B351 Tinea unguium: Secondary | ICD-10-CM

## 2018-11-10 DIAGNOSIS — M79675 Pain in left toe(s): Secondary | ICD-10-CM | POA: Diagnosis not present

## 2018-11-10 DIAGNOSIS — M79674 Pain in right toe(s): Secondary | ICD-10-CM | POA: Diagnosis not present

## 2018-11-10 NOTE — Progress Notes (Signed)
Subjective: Tiffany Gibbs is a 73 y.o. female patient with history of diabetes who returns to office today complaining of long,mildly painful nails  while ambulating in shoes; unable to trim. Using tea tree oil and penlac to big toenails and reports that it is helping. Patient states that the glucose reading this morning was not recorded last A1c 5.3 and last saw her primary care doctor 2 weeks ago and is recovering from Vaginal surgery. No other issues.   Patient Active Problem List   Diagnosis Date Noted  . Cystocele with prolapse 11/01/2018  . Cramp of limb 08/16/2018  . Vaginal prolapse 07/19/2018  . 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 Obstructive sleep apnea treated with continuous positive airway pressure (CPAP) 06/04/2017  . Degenerative arthritis of knee, bilateral 05/25/2017  . Left lumbar radiculopathy 04/27/2017  . Right knee pain 04/27/2017  . Tick bite 08/18/2016  . Drusen of macula of both eyes 05/04/2016  . Dry eyes, bilateral 05/04/2016  . Pseudophakia of both eyes 05/04/2016  . PVD (posterior vitreous detachment), both eyes 05/04/2016  . Dupuytren's disease of palm 04/01/2016  . Degenerative disc disease, lumbar 12/04/2015  . Elevated hemoglobin ([place]) 04/17/2015  . Occipital neuralgia of right side 04/17/2015  . Well adult exam 11/19/2014  . Asthmatic bronchitis 10/15/2014  . Neck pain on right side 10/15/2014  . Acute sinusitis 09/26/2014  . Dyslipidemia 06/18/2014  . Fracture of middle phalanx of finger 05/02/2014  . Hand pain, right 04/19/2014  . DM2 (diabetes mellitus, type 2) ([place]) 04/19/2014  . Hematoma of leg 12/15/2013  . Phlebitis of superficial vein of lower extremity 11/30/2013  . Otitis, externa, infective 11/30/2013  . Onychomycosis 08/30/2013  . Lower leg pain 05/10/2013  . Corn or callus 02/09/2013  . Acute posthemorrhagic anemia 01/19/2013  . Constipation 12/16/2012  . Left hip pain 12/16/2012  .  Osteoarthritis of hip Left 11/27/2012  . Rash and nonspecific skin eruption 07/27/2012  . Cough 03/31/2012  . Diarrhea 02/05/2012  . Microhematuria 09/09/2011  . Elevated LFTs 09/09/2011  . B12 deficiency 06/03/2011  . Cellulitis 03/10/2011  . Edema 03/10/2011  . Paresthesia of arm 02/25/2011  . Memory difficulty 11/03/2010  . Fibromyalgia syndrome 07/10/2010  . OSA (obstructive sleep apnea) 07/02/2010  . SIALADENITIS 11/25/2009  . TOBACCO USE, QUIT 04/04/2009  . ALLERGIC RHINITIS CAUSE UNSPECIFIED 10/04/2008  . URTICARIA 10/04/2008  . URINARY RETENTION 09/27/2008  . HIP PAIN 07/17/2008  . CERVICAL RADICULITIS 07/21/2007  . Headache 07/21/2007  . Osteoporosis 02/16/2007  . LOW BACK PAIN 12/27/2006  . Obesity 08/07/2006  . Anxiety 08/07/2006  . Depression 08/07/2006  . Hypertension, essential 08/07/2006  . OSTEOARTHRITIS 08/07/2006   Current Outpatient Medications on File Prior to Visit  Medication Sig Dispense Refill  . diphenhydrAMINE (BENADRYL ALLERGY) 25 mg capsule Take 25 mg by mouth daily as needed (allergies).     . fluticasone (FLONASE) 50 MCG/ACT nasal spray Place 1 spray into both nostrils daily as needed for allergies or rhinitis.    [name] LORazepam (ATIVAN) 1 MG tablet Take 1 tablet (1 mg total) by mouth 2 (two) times daily as needed for anxiety. 180 tablet 0  . metFORMIN (GLUCOPHAGE) 500 MG tablet Take 1 tablet (500 mg total) by mouth 2 (two) times daily with a meal. 180 tablet 3  . oxyCODONE-acetaminophen (PERCOCET/ROXICET) 5-325 MG tablet Take 1-2 tablets by mouth every 6 (six) hours as needed for moderate pain or severe pain. 20 tablet 0  .  phentermine (ADIPEX-P) 37.5 MG tablet TAKE ONE TABLET BY MOUTH ONE TIME DAILY  before breakfast (Patient taking differently: Take 37.5 mg by mouth daily before breakfast. ) 30 tablet 1  . potassium chloride (KLOR-CON) 8 MEQ tablet Take 1 tablet (8 mEq total) by mouth 2 (two) times daily. 180 tablet 3   No current  facility-administered medications on file prior to visit.    Allergies  Allergen Reactions  . Celecoxib     swell  . Enalapril     cough  . Losartan     falls  . Lovastatin     REACTION: aches  . Naproxen   . Pravachol [Pravastatin Sodium]     Nausea   . Zetia [Ezetimibe]     Myalgias in LEs    Recent Results (from the past 2160 hour(s))  Urine Culture     Status: Abnormal   Collection Time: 08/22/18  3:37 PM   Specimen: Urine, Clean Catch   UC  Result Value Ref Range   Urine Culture, Routine Final report (A)    Organism ID, Bacteria Comment (A)     Comment: Beta hemolytic Streptococcus, group B 25,000-50,000 colony forming units per mL Penicillin and ampicillin are drugs of choice for treatment of beta-hemolytic streptococcal infections. Susceptibility testing of penicillins and other beta-lactam agents approved by the FDA for treatment of beta-hemolytic streptococcal infections need not be performed routinely because nonsusceptible isolates are extremely rare in any beta-hemolytic streptococcus and have not been reported for Streptococcus pyogenes (group A). (CLSI)   Urine Microscopic     Status: Abnormal   Collection Time: 08/22/18  3:38 PM  Result Value Ref Range   WBC, UA 11-30 (A) 0 - 5 /hpf   RBC 11-30 (A) 0 - 2 /hpf   Epithelial Cells (non renal) 0-10 0 - 10 /hpf   Casts Present (A) None seen /lpf   Cast Type Hyaline casts N/A   Crystals Present (A) N/A   Crystal Type Calcium Oxalate N/A   Mucus, UA Present Not Estab.   Bacteria, UA Few None seen/Few  Novel Coronavirus, NAA (Hosp order, Send-out to Ref Lab; TAT 18-24 hrs     Status: None   Collection Time: 10/28/18  1:21 PM   Specimen: Nasopharyngeal Swab; Respiratory  Result Value Ref Range   SARS-CoV-2, NAA NOT DETECTED NOT DETECTED    Comment: (NOTE) This nucleic acid amplification test was developed and its performance characteristics determined by [employer]. Nucleic acid amplification  tests include PCR and TMA. This test has not been FDA cleared or approved. This test has been authorized by FDA under an Emergency Use Authorization (EUA). This test is only authorized for the duration of time the declaration that circumstances exist justifying the authorization of the emergency use of in vitro diagnostic tests for detection of SARS-CoV-2 virus and/or diagnosis of COVID-19 infection under section 564(b)(1) of the Act, 21 U.S.C. [id number]) (1), unless the authorization is terminated or revoked sooner. When diagnostic testing is negative, the possibility of a false negative result should be considered in the context of a patient's recent exposures and the presence of clinical signs and symptoms consistent with COVID-19. An individual without symptoms of COVID- 19 and who is not shedding SARS-CoV-2 vi rus would expect to have a negative (not detected) result in this assay. Performed At: [facility] [address] [place], [place] [id number] Rush Farmer MD [id number]    Coronavirus Source NASOPHARYNGEAL     Comment: Performed at [facility], [place] [address] [place], [place] [address]  Glucose, capillary     Status: None   Collection Time: 10/28/18  2:09 PM  Result Value Ref Range   Glucose-Capillary 76 70 - 99 mg/dL  Basic metabolic panel     Status: None   Collection Time: 10/28/18  3:10 PM  Result Value Ref Range   Sodium 140 135 - 145 mmol/L   Potassium 4.2 3.5 - 5.1 mmol/L   Chloride 106 98 - 111 mmol/L   CO2 26 22 - 32 mmol/L   Glucose, Bld 93 70 - 99 mg/dL   BUN 18 8 - 23 mg/dL   Creatinine, Ser 0.78 0.44 - 1.00 mg/dL   Calcium 9.6 8.9 - 10.3 mg/dL   GFR calc non Af Amer >60 >60 mL/min   GFR calc Af Amer >60 >60 mL/min   Anion gap 8 5 - 15    Comment: Performed at [facility], [place] [address] [place], [place] [address]  CBC     Status: None   Collection Time: 10/28/18  3:10 PM  Result Value Ref Range   WBC 5.9  4.0 - 10.5 K/uL   RBC 4.58 3.87 - 5.11 MIL/uL   Hemoglobin 13.7 12.0 - 15.0 g/dL   HCT 41.8 36.0 - 46.0 %   MCV 91.3 80.0 - 100.0 fL   MCH 29.9 26.0 - 34.0 pg   MCHC 32.8 30.0 - 36.0 g/dL   RDW 12.1 11.5 - 15.5 %   Platelets 250 150 - 400 K/uL   nRBC 0.0 0.0 - 0.2 %    Comment: Performed at [facility], [place] [address] [place], [place] [address]  Protime-INR     Status: None   Collection Time: 10/28/18  3:10 PM  Result Value Ref Range   Prothrombin Time 13.5 11.4 - 15.2 seconds   INR 1.0 0.8 - 1.2    Comment: (NOTE) INR goal varies based on device and disease states. Performed at [facility], [place] [address], [place], [place] [address]   Type and screen     Status: None   Collection Time: 10/28/18  3:10 PM  Result Value Ref Range   ABO/RH(D) A POS    Antibody Screen NEG    Sample Expiration 11/04/2018,2359    Extend sample reason      NO TRANSFUSIONS OR PREGNANCY IN THE PAST 3 MONTHS Performed at [facility], [place] [address] [place], [place] [address]   ABO/Rh     Status: None   Collection Time: 10/28/18  3:10 PM  Result Value Ref Range   ABO/RH(D)      A POS Performed at [facility], [place] [address], [place], [place] [address]   Glucose, capillary     Status: None   Collection Time: 11/01/18  5:50 AM  Result Value Ref Range   Glucose-Capillary 75 70 - 99 mg/dL   Comment 1 Notify RN    Comment 2 Document in Chart   Hemoglobin A1c     Status: None   Collection Time: 11/01/18  6:00 AM  Result Value Ref Range   Hgb A1c MFr Bld 5.3 4.8 - 5.6 %    Comment: (NOTE) Pre diabetes:          5.7%-6.4% Diabetes:              >6.4% Glycemic control for   <7.0% adults with diabetes    Mean Plasma Glucose  105.41 mg/dL    Comment: Performed at [facility], [place] [address], [place], [place] [address]  Glucose, capillary     Status: Abnormal   Collection Time: 11/01/18 10:21 AM  Result Value  Ref Range   Glucose-Capillary 111 (H) 70 - 99 mg/dL  Glucose, capillary     Status: Abnormal   Collection Time: 11/01/18  1:08 PM  Result Value Ref Range   Glucose-Capillary 112 (H) 70 - 99 mg/dL  Hemoglobin and hematocrit, blood     Status: Abnormal   Collection Time: 11/01/18  4:09 PM  Result Value Ref Range   Hemoglobin 11.8 (L) 12.0 - 15.0 g/dL   HCT 36.8 36.0 - 46.0 %    Comment: Performed at [facility], [place] [address], [place], [place] [address]  Glucose, capillary     Status: Abnormal   Collection Time: 11/01/18  4:35 PM  Result Value Ref Range   Glucose-Capillary 112 (H) 70 - 99 mg/dL  Glucose, capillary     Status: Abnormal   Collection Time: 11/01/18  9:05 PM  Result Value Ref Range   Glucose-Capillary 123 (H) 70 - 99 mg/dL  Basic metabolic panel     Status: Abnormal   Collection Time: 11/02/18  5:22 AM  Result Value Ref Range   Sodium 135 135 - 145 mmol/L   Potassium 4.0 3.5 - 5.1 mmol/L   Chloride 104 98 - 111 mmol/L   CO2 22 22 - 32 mmol/L   Glucose, Bld 117 (H) 70 - 99 mg/dL   BUN 9 8 - 23 mg/dL   Creatinine, Ser 0.72 0.44 - 1.00 mg/dL   Calcium 8.8 (L) 8.9 - 10.3 mg/dL   GFR calc non Af Amer >60 >60 mL/min   GFR calc Af Amer >60 >60 mL/min   Anion gap 9 5 - 15    Comment: Performed at [facility], [place] [address], [place], [place] [address]  Hemoglobin and hematocrit, blood     Status: Abnormal   Collection Time: 11/02/18  5:22 AM  Result Value Ref Range   Hemoglobin 11.8 (L) 12.0 - 15.0 g/dL   HCT 36.3 36.0 - 46.0 %    Comment: Performed at [facility], [place] [address], [place], [place] [address]  Glucose, capillary     Status: Abnormal   Collection Time: 11/02/18  7:48 AM  Result Value Ref Range   Glucose-Capillary 108 (H) 70 - 99 mg/dL    Objective: General: Patient is awake, alert, and oriented x 3 and in no acute distress.  Integument: Skin is warm, dry and supple bilateral. Nails are  tender, long, thickened and  dystrophic with subungual debris, consistent with onychomycosis, 1-5 bilateral with bilateral hallux most involved. No signs of infection. No open lesions or preulcerative lesions present bilateral. Remaining integument unremarkable.  Vasculature:  Dorsalis Pedis pulse 1/4 bilateral. Posterior Tibial pulse  1/4 bilateral.  Capillary fill time <3 sec 1-5 bilateral. Positive hair growth to the level of the digits. Temperature gradient within normal limits.  Minimal varicosities present bilateral. No edema present bilateral.   Neurology: The patient has intact sensation measured with a 5.07/10g Semmes Weinstein Monofilament at all pedal sites bilateral. Vibratory sensation diminished bilateral with tuning fork. No Babinski sign present bilateral.  Subjective cramping occasionally in both feet and lower extremities.  Musculoskeletal: Asymptomatic hammertoe pedal deformities noted bilateral. Muscular strength 5/5 in all lower extremity muscular groups bilateral without pain on range of motion .  No tenderness with calf compression bilateral.  Assessment and Plan: Problem List Items Addressed This Visit    None    Visit Diagnoses    Pain due to onychomycosis of toenails of both feet    -  Primary   Diabetic polyneuropathy associated with type 2 diabetes mellitus ([place])         -Examined patient. -Re-discussed and educated patient on diabetic foot care, especially with  regards to the vascular, neurological and musculoskeletal systems.  -Mechanically debrided all nails 1-5 bilateral using sterile nail nipper and filed with dremel without incident  -Continue with penlac or Tea tree oil like previous -Answered all patient questions -Patient to return  in 3 months for at risk foot care  -Patient advised to call the office if any problems or questions arise in the meantime.  Landis Martins, DPM

## 2018-11-13 ENCOUNTER — Other Ambulatory Visit: Payer: Self-pay | Admitting: Internal Medicine

## 2018-11-15 DIAGNOSIS — N8111 Cystocele, midline: Secondary | ICD-10-CM | POA: Diagnosis not present

## 2018-11-15 DIAGNOSIS — R350 Frequency of micturition: Secondary | ICD-10-CM | POA: Diagnosis not present

## 2018-11-30 ENCOUNTER — Ambulatory Visit: Payer: Medicare HMO | Admitting: Internal Medicine

## 2018-12-02 ENCOUNTER — Telehealth: Payer: Self-pay

## 2018-12-02 NOTE — Telephone Encounter (Signed)
Copied from [place] [id number]. Topic: General - Inquiry >> Dec 02, 2018 10:20 AM Rutherford Nail, NT wrote: Reason for CRM: Patient calling and would like a call back from [place]. States that she would like to get some advice about where to take her husband or who Dr Alain [place] recommends he see for BP issues(198/98). Advised that Dr Alain [place] would likely have him discuss with his PCP and she states that [facility] and have [facility] scheduled. Husband is not a patient of [facility], but is a patient at the [place]. Wife wants him to have a PCP he can go see outside of the [place]. Was in the ED last night/early this morning for BP issues. Just wanting recommendations on who Dr Alain [place] would suggest he see since she has always trusted his recommendations. CB#: [phone]  Routing to tammy/sched----patient only sees specialty programs at [place], but has [employer] insurance---so his insurance will cover him seeing a [facility]---patient lives in [place] and would like a new pcp at one of the [facility] [place] offices close to home----can you please call to advise which pcp's are  Taking new patients close to his residence and schedule new patient appt with wife, thanks

## 2018-12-09 ENCOUNTER — Other Ambulatory Visit: Payer: Self-pay | Admitting: Internal Medicine

## 2019-01-08 ENCOUNTER — Other Ambulatory Visit: Payer: Self-pay | Admitting: Internal Medicine

## 2019-01-09 ENCOUNTER — Telehealth: Payer: Self-pay

## 2019-01-09 DIAGNOSIS — E538 Deficiency of other specified B group vitamins: Secondary | ICD-10-CM

## 2019-01-09 DIAGNOSIS — E785 Hyperlipidemia, unspecified: Secondary | ICD-10-CM

## 2019-01-09 DIAGNOSIS — L509 Urticaria, unspecified: Secondary | ICD-10-CM

## 2019-01-09 DIAGNOSIS — E559 Vitamin D deficiency, unspecified: Secondary | ICD-10-CM

## 2019-01-09 DIAGNOSIS — E119 Type 2 diabetes mellitus without complications: Secondary | ICD-10-CM

## 2019-01-09 NOTE — Telephone Encounter (Signed)
Please advise 

## 2019-01-09 NOTE — Telephone Encounter (Signed)
Copied from [place] [phone]. Topic: Appointment Scheduling - Scheduling Inquiry for Clinic >> Jan 09, 2019  1:47 PM Tiffany Gibbs wrote: Reason for CRM:   Pt would like for Dr. Alain [place] to put in lab orders for her and she wants to come in to have them done on 12/30 or 12/31.  Pt states that she wants to have a complete lab of absolutely everything that she can have done following a vaginal operation.  Pt states that she has been taken off all but 3 medications.

## 2019-01-10 NOTE — Telephone Encounter (Signed)
Ok Thx 

## 2019-01-10 NOTE — Telephone Encounter (Signed)
LM notifying pt that labs are ordered and to call back to get on lab schedule

## 2019-01-10 NOTE — Telephone Encounter (Signed)
Patient called to follow up on this refill.

## 2019-01-12 NOTE — Telephone Encounter (Signed)
Patient called back regarding this refill. Please advise.

## 2019-01-19 ENCOUNTER — Other Ambulatory Visit: Payer: Self-pay

## 2019-01-19 ENCOUNTER — Encounter: Payer: Self-pay | Admitting: Internal Medicine

## 2019-01-19 ENCOUNTER — Telehealth: Payer: Self-pay

## 2019-01-19 ENCOUNTER — Ambulatory Visit (INDEPENDENT_AMBULATORY_CARE_PROVIDER_SITE_OTHER): Payer: Medicare HMO | Admitting: Internal Medicine

## 2019-01-19 ENCOUNTER — Other Ambulatory Visit (INDEPENDENT_AMBULATORY_CARE_PROVIDER_SITE_OTHER): Payer: Medicare HMO

## 2019-01-19 DIAGNOSIS — Z6834 Body mass index (BMI) 34.0-34.9, adult: Secondary | ICD-10-CM | POA: Diagnosis not present

## 2019-01-19 DIAGNOSIS — E119 Type 2 diabetes mellitus without complications: Secondary | ICD-10-CM

## 2019-01-19 DIAGNOSIS — L509 Urticaria, unspecified: Secondary | ICD-10-CM | POA: Diagnosis not present

## 2019-01-19 DIAGNOSIS — N814 Uterovaginal prolapse, unspecified: Secondary | ICD-10-CM

## 2019-01-19 DIAGNOSIS — E6609 Other obesity due to excess calories: Secondary | ICD-10-CM | POA: Diagnosis not present

## 2019-01-19 DIAGNOSIS — E538 Deficiency of other specified B group vitamins: Secondary | ICD-10-CM | POA: Diagnosis not present

## 2019-01-19 DIAGNOSIS — E559 Vitamin D deficiency, unspecified: Secondary | ICD-10-CM | POA: Diagnosis not present

## 2019-01-19 DIAGNOSIS — R69 Illness, unspecified: Secondary | ICD-10-CM | POA: Diagnosis not present

## 2019-01-19 DIAGNOSIS — E785 Hyperlipidemia, unspecified: Secondary | ICD-10-CM

## 2019-01-19 DIAGNOSIS — F419 Anxiety disorder, unspecified: Secondary | ICD-10-CM

## 2019-01-19 LAB — CBC WITH DIFFERENTIAL/PLATELET
Basophils Absolute: 0.1 10*3/uL (ref 0.0–0.1)
Basophils Relative: 0.8 % (ref 0.0–3.0)
Eosinophils Absolute: 0.1 10*3/uL (ref 0.0–0.7)
Eosinophils Relative: 1.3 % (ref 0.0–5.0)
HCT: 41 % (ref 36.0–46.0)
Hemoglobin: 13.7 g/dL (ref 12.0–15.0)
Lymphocytes Relative: 25.3 % (ref 12.0–46.0)
Lymphs Abs: 1.7 10*3/uL (ref 0.7–4.0)
MCHC: 33.4 g/dL (ref 30.0–36.0)
MCV: 89.3 fl (ref 78.0–100.0)
Monocytes Absolute: 0.5 10*3/uL (ref 0.1–1.0)
Monocytes Relative: 7.2 % (ref 3.0–12.0)
Neutro Abs: 4.4 10*3/uL (ref 1.4–7.7)
Neutrophils Relative %: 65.4 % (ref 43.0–77.0)
Platelets: 269 10*3/uL (ref 150.0–400.0)
RBC: 4.59 Mil/uL (ref 3.87–5.11)
RDW: 13.1 % (ref 11.5–15.5)
WBC: 6.7 10*3/uL (ref 4.0–10.5)

## 2019-01-19 LAB — URINALYSIS, ROUTINE W REFLEX MICROSCOPIC
Bilirubin Urine: NEGATIVE
Ketones, ur: 15 — AB
Nitrite: NEGATIVE
Specific Gravity, Urine: 1.025 (ref 1.000–1.030)
Total Protein, Urine: NEGATIVE
Urine Glucose: NEGATIVE
Urobilinogen, UA: 0.2 (ref 0.0–1.0)
pH: 6 (ref 5.0–8.0)

## 2019-01-19 LAB — BASIC METABOLIC PANEL
BUN: 23 mg/dL (ref 6–23)
CO2: 27 mEq/L (ref 19–32)
Calcium: 9.7 mg/dL (ref 8.4–10.5)
Chloride: 102 mEq/L (ref 96–112)
Creatinine, Ser: 1.16 mg/dL (ref 0.40–1.20)
GFR: 45.72 mL/min — ABNORMAL LOW (ref >60.00)
Glucose, Bld: 95 mg/dL (ref 70–99)
Potassium: 3.5 mEq/L (ref 3.5–5.1)
Sodium: 139 mEq/L (ref 135–145)

## 2019-01-19 LAB — LIPID PANEL
Cholesterol: 214 mg/dL — ABNORMAL HIGH (ref 0–200)
HDL: 66.6 mg/dL (ref >39.00)
LDL Cholesterol: 130 mg/dL — ABNORMAL HIGH (ref 0–99)
NonHDL: 147.55
Total CHOL/HDL Ratio: 3
Triglycerides: 87 mg/dL (ref 0.0–149.0)
VLDL: 17.4 mg/dL (ref 0.0–40.0)

## 2019-01-19 LAB — HEPATIC FUNCTION PANEL
ALT: 9 U/L (ref 0–35)
AST: 14 U/L (ref 0–37)
Albumin: 4.6 g/dL (ref 3.5–5.2)
Alkaline Phosphatase: 65 U/L (ref 39–117)
Bilirubin, Direct: 0.1 mg/dL (ref 0.0–0.3)
Total Bilirubin: 0.8 mg/dL (ref 0.2–1.2)
Total Protein: 7.1 g/dL (ref 6.0–8.3)

## 2019-01-19 LAB — TSH: TSH: 0.79 u[IU]/mL (ref 0.35–4.50)

## 2019-01-19 LAB — VITAMIN D 25 HYDROXY (VIT D DEFICIENCY, FRACTURES): VITD: 49.04 ng/mL (ref 30.00–100.00)

## 2019-01-19 LAB — HEMOGLOBIN A1C: Hgb A1c MFr Bld: 5.4 % (ref 4.6–6.5)

## 2019-01-19 LAB — VITAMIN B12: Vitamin B-12: 405 pg/mL (ref 211–911)

## 2019-01-19 MED ORDER — MAGNESIUM 400 MG PO TABS: 1.0000 | ORAL_TABLET | Freq: Every day | ORAL | 3 refills | Status: DC

## 2019-01-19 MED ORDER — ESCITALOPRAM OXALATE 5 MG PO TABS: 5.0000 mg | ORAL_TABLET | Freq: Every day | ORAL | 3 refills | Status: DC

## 2019-01-19 NOTE — Assessment & Plan Note (Signed)
Potential benefits of a long/short term Adipex use as well as potential risks  and complications were explained to the patient and were aknowledged. Phentermine

## 2019-01-19 NOTE — Assessment & Plan Note (Signed)
S/p cystocele repair in Oct 2020 - doing well

## 2019-01-19 NOTE — Assessment & Plan Note (Addendum)
Off Celexa Worse Try Lexapro Lorazepam prn

## 2019-01-19 NOTE — Progress Notes (Signed)
Subjective:  Patient ID: Tiffany Gibbs, female    DOB: 17-Dec-1945  Age: 74 y.o. MRN: [id number]  CC: No chief complaint on file.   HPI Tiffany Gibbs presents for HTN, DM, cystocele repair in Oct 2020   Outpatient Medications Prior to Visit  Medication Sig Dispense Refill  . ciclopirox (PENLAC) 8 % solution APPLY OVER NAIL AND SURROUNDING SKIN DAILY AT BEDTIME. APPLY DAILY OVER PREVIOUS COAT. AFTER 7 DAYS MAY REMOVE WITH ALCOHOL AND CONTINUE CYCLE 6.6 mL 1  . diphenhydrAMINE (BENADRYL ALLERGY) 25 mg capsule Take 25 mg by mouth daily as needed (allergies).     . fluticasone (FLONASE) 50 MCG/ACT nasal spray Place 1 spray into both nostrils daily as needed for allergies or rhinitis.    [name] LORazepam (ATIVAN) 1 MG tablet take 1 tablet by mouth twice a day as needed for anxiety  60 tablet 5  . Magnesium 400 MG TABS Take by mouth.    . metFORMIN (GLUCOPHAGE) 500 MG tablet Take 1 tablet (500 mg total) by mouth 2 (two) times daily with a meal. 180 tablet 3  . phentermine (ADIPEX-P) 37.5 MG tablet Take 1 tablet (37.5 mg total) by mouth daily before breakfast. 30 tablet 2  . potassium chloride (KLOR-CON) 8 MEQ tablet Take 1 tablet (8 mEq total) by mouth 2 (two) times daily. 180 tablet 3  . Probiotic Product (PROBIOTIC PO) Take by mouth 2 (two) times daily.    [name] oxyCODONE-acetaminophen (PERCOCET/ROXICET) 5-325 MG tablet Take 1-2 tablets by mouth every 6 (six) hours as needed for moderate pain or severe pain. (Patient not taking: Reported on 01/19/2019) 20 tablet 0   No facility-administered medications prior to visit.    ROS: Review of Systems  Constitutional: Negative for activity change, appetite change, chills, fatigue and unexpected weight change.  HENT: Negative for congestion, mouth sores and sinus pressure.   Eyes: Negative for visual disturbance.  Respiratory: Negative for cough and chest tightness.   Gastrointestinal: Negative for abdominal pain and nausea.  Genitourinary: Negative for  difficulty urinating, frequency and vaginal pain.  Musculoskeletal: Negative for back pain and gait problem.  Skin: Negative for pallor and rash.  Neurological: Negative for dizziness, tremors, weakness, numbness and headaches.  Psychiatric/Behavioral: Negative for confusion, sleep disturbance and suicidal ideas. The patient is nervous/anxious.     Objective:  BP (!) 156/84 (BP Location: Left Arm, Patient Position: Sitting, Cuff Size: Normal)   Pulse 80   Temp 98 F (36.7 C) (Oral)   Ht 5\' 3"  (1.6 m)   Wt 140 lb (63.5 kg)   SpO2 99%   BMI 24.80 kg/m   BP Readings from Last 3 Encounters:  01/19/19 (!) 156/84  11/02/18 111/60  10/28/18 128/68    Wt Readings from Last 3 Encounters:  01/19/19 140 lb (63.5 kg)  11/01/18 141 lb 1.5 oz (64 kg)  10/28/18 142 lb 12.8 oz (64.8 kg)    Physical Exam Constitutional:      General: She is not in acute distress.    Appearance: She is well-developed.  HENT:     Head: Normocephalic.     Right Ear: External ear normal.     Left Ear: External ear normal.     Nose: Nose normal.  Eyes:     General:        Right eye: No discharge.        Left eye: No discharge.     Conjunctiva/sclera: Conjunctivae normal.     Pupils: Pupils are  equal, round, and reactive to light.  Neck:     Thyroid: No thyromegaly.     Vascular: No JVD.     Trachea: No tracheal deviation.  Cardiovascular:     Rate and Rhythm: Normal rate and regular rhythm.     Heart sounds: Normal heart sounds.  Pulmonary:     Effort: No respiratory distress.     Breath sounds: No stridor. No wheezing.  Abdominal:     General: Bowel sounds are normal. There is no distension.     Palpations: Abdomen is soft. There is no mass.     Tenderness: There is no abdominal tenderness. There is no guarding or rebound.  Musculoskeletal:        General: No tenderness.     Cervical back: Normal range of motion and neck supple.  Lymphadenopathy:     Cervical: No cervical adenopathy.   Skin:    Findings: No erythema or rash.  Neurological:     Mental Status: She is oriented to person, place, and time.     Cranial Nerves: No cranial nerve deficit.     Motor: No abnormal muscle tone.     Coordination: Coordination normal.     Deep Tendon Reflexes: Reflexes normal.  Psychiatric:        Behavior: Behavior normal.        Thought Content: Thought content normal.        Judgment: Judgment normal.     Lab Results  Component Value Date   WBC 5.9 10/28/2018   HGB 11.8 (L) 11/02/2018   HCT 36.3 11/02/2018   PLT 250 10/28/2018   GLUCOSE 117 (H) 11/02/2018   CHOL 163 12/23/2017   TRIG 80.0 12/23/2017   HDL 52.70 12/23/2017   LDLDIRECT 148.2 02/03/2013   LDLCALC 95 12/23/2017   ALT 10 12/23/2017   AST 13 12/23/2017   NA 135 11/02/2018   K 4.0 11/02/2018   CL 104 11/02/2018   CREATININE 0.72 11/02/2018   BUN 9 11/02/2018   CO2 22 11/02/2018   TSH 1.11 12/23/2017   INR 1.0 10/28/2018   HGBA1C 5.3 11/01/2018   MICROALBUR 0.9 12/23/2017    No results found.  Assessment & Plan:    Follow-up: No follow-ups on file.  Walker Kehr, MD

## 2019-01-19 NOTE — Assessment & Plan Note (Signed)
Labs

## 2019-01-19 NOTE — Telephone Encounter (Signed)
Patient would like most recent lab results mailed to her once they have been resulted.

## 2019-01-19 NOTE — Assessment & Plan Note (Signed)
On B12 

## 2019-01-20 ENCOUNTER — Telehealth: Payer: Self-pay

## 2019-01-20 NOTE — Telephone Encounter (Signed)
Copied from [place] [id number]. Topic: General - Other >> Jan 20, 2019  9:10 AM Leward Quan A wrote: Reason for CRM: Patient called to ask Dr Lanice Schwab if she still be taking Asprin 81MG  or Ezitimibe 10MG . Asking for a call back please Ph# [phone]

## 2019-01-20 NOTE — Telephone Encounter (Signed)
Called pt no answer LMOM w/MD response, also left msg w/lab results and mail copy to address.Marland KitchenJohny Chess

## 2019-01-20 NOTE — Telephone Encounter (Signed)
No need to restart aspirin and Zetia.  Her coronary CT calcium score was 0.  Thanks

## 2019-01-20 NOTE — Telephone Encounter (Signed)
Spoke w/pt inform her will mail once MD review labs.Marland KitchenJohny Chess

## 2019-01-20 NOTE — Telephone Encounter (Signed)
Called pt back to clarify msg. Pt states before she had surgery they stopped all her medications. She saw Dr. Alain Marion yesterday and forgot to ask if she need to start back taking. She states he had blood work as well. Inform pt MD is out of the office today, but will forward the msg to his desktop. He did check her cholesterol on yesterday so if he want her to continue taking or if he change med wewill call her back w/his response.Marland KitchenJohny Chess

## 2019-01-26 ENCOUNTER — Telehealth: Payer: Self-pay | Admitting: Internal Medicine

## 2019-01-26 NOTE — Telephone Encounter (Signed)
Patient is very concerned about her test results specfic to her kidneys and LDL. She has made an appt with a diabetic specialist and is talking about increasing a pill that Dr Mamie Nick told her to not take. Told pt that she need to wait till she was contacted by Dr Mamie Nick office before she made any changes. Pls fu with pt at [phone]

## 2019-01-27 NOTE — Telephone Encounter (Signed)
Labs are okay.  What pill?

## 2019-01-27 NOTE — Telephone Encounter (Signed)
Please advise 

## 2019-01-30 ENCOUNTER — Ambulatory Visit (INDEPENDENT_AMBULATORY_CARE_PROVIDER_SITE_OTHER): Payer: Medicare HMO | Admitting: Family Medicine

## 2019-01-30 ENCOUNTER — Other Ambulatory Visit: Payer: Self-pay

## 2019-01-30 ENCOUNTER — Ambulatory Visit: Payer: Medicare HMO | Admitting: Family Medicine

## 2019-01-30 ENCOUNTER — Telehealth: Payer: Self-pay

## 2019-01-30 ENCOUNTER — Encounter: Payer: Self-pay | Admitting: Family Medicine

## 2019-01-30 VITALS — BP 112/70 | HR 92 | Ht 63.0 in | Wt 141.2 lb

## 2019-01-30 DIAGNOSIS — M79605 Pain in left leg: Secondary | ICD-10-CM | POA: Diagnosis not present

## 2019-01-30 MED ORDER — BACLOFEN 10 MG PO TABS: 10.0000 mg | ORAL_TABLET | Freq: Every evening | ORAL | 0 refills | Status: DC | PRN

## 2019-01-30 NOTE — Patient Instructions (Addendum)
Thank you for coming in today. You should hear soon about the ultrasound for evaluate blood clot.  Do the exercises we discussed.  Try baclofen at bedtime.  Recheck in 1 month.  Return sooner if needed.   Please perform the exercise program that we have prepared for you and gone over in detail on a daily basis.  In addition to the handout you were provided you can access your program through: [email or web address]   Your unique program code is: [email or web address]

## 2019-01-30 NOTE — Progress Notes (Signed)
   I, Tiffany Gibbs, LAT, ATC, am serving as scribe for Dr. Lynne Leader.  Tiffany Gibbs is a 74 y.o. female who presents to [place] at [facility] today for L post-lat lower leg cramp/spasm x 4 days that won't dissipate.  Pt last saw Dr. Tamala Gibbs on 06/29/17 for B hand and foot pain.  She rates her L leg pain at a 9/10 and describes it as bruised type sensation.  Pt states that she's been drinking 72 oz of water/day.  Cramping tends to occur at bedtime and sometimes can be very painful.  She has a little bit of residual soreness outside of when her calf is cramping.  Radiating pain: to the ankle Swelling, redness or warmth in the area: No Numbness/tingling: in her B toes at night Aggravating factors: Walking / weight bearing Treatments tried: Stretching; Topical pain-relieving cream/gel; Mg (oral and spray form)   Pertinent review of systems: No fevers or chills.  No trouble breathing.  No chest pain or palpitation.  Relevant historical information: Long history bilateral calf cramping.  Pertinent family history of mother who died from DVT PE.   Exam:  BP 112/70 (BP Location: Left Arm, Patient Position: Sitting, Cuff Size: Normal)   Pulse 92   Ht 5\' 3"  (1.6 m)   Wt 141 lb 3.2 oz (64 kg)   SpO2 97%   BMI 25.01 kg/m  General: Well Developed, well nourished, and in no acute distress.   MSK: Left calf: Normal-appearing no significant swelling or erythema. Normal ankle and knee motion. Mildly tender palpation at medial and lateral gastrocnemius. No palpable cords. Normal strength ankle plantar flexion dorsiflexion. Pulses cap refill and sensation are intact distally.      Assessment and Plan: 74 y.o. female with persistent and now worsening left leg cramping.  Symptoms tend to occur at nighttime.  Discussed options.  Plan for vascular ultrasound to rule out DVT.  This is less likely but with her family history obviously can be very concerning.  We will treat with  eccentric exercises and stretching targeted gastrocnemius.  Additionally will have trial of baclofen at bedtime as this sometimes can be helpful.  Cautioned against use of baclofen with other sedating medications such as Ativan.  Recheck back in about a month.  Return sooner if needed.   PDMP not reviewed this encounter. No orders of the defined types were placed in this encounter.  Meds ordered this encounter  Medications  . baclofen (LIORESAL) 10 MG tablet    Sig: Take 1-2 tablets (10-20 mg total) by mouth at bedtime as needed for muscle spasms.    Dispense:  40 each    Refill:  0     Discussed warning signs or symptoms. Please see discharge instructions. Patient expresses understanding.   The above documentation has been reviewed and is accurate and complete Lynne Leader

## 2019-01-30 NOTE — Telephone Encounter (Signed)
Patient called and spoke to team health on : 01/28/2019 7:43:46 AM and states "Caller states got all blood work and tests back last week; diabetic and noticed kidneys are down 15 points again, down to 42 and should be 60. Can that number ever go back up? and if so how to get it back up? No sx.Caller is wondering if her kidney function can go back up. She states she did speak with physician. She was told drink more water."  Team health advised "-I advised caller to follow up with doctor's office when it opens. I advised patient to call back she starts not feeling well/ symptomatic. I also advised patient to continue drinking water as advised by her physician and to monitor BS levels. Patient is requesting a call from physician to discuss her lab results further."

## 2019-01-30 NOTE — Progress Notes (Deleted)
   I, [name], LAT, ATC, am serving as scribe for Dr. Lynne Leader.  Tiffany Gibbs is a 74 y.o. female who presents to [place] at [facility] today for c/o L leg cramp x 4 days that won't dissipate.  She was last seen by Dr. Tamala Julian on 06/29/17 for B hand and foot pain and numbness.  She rates her L leg pain as a /10 and describes her pain as .  Radiating pain: Numbness/tingling: Swelling, warmth or redness: Aggravating factors: Treatments tried:   Pertinent review of systems: ***  Relevant historical information: ***   Exam:  There were no vitals taken for this visit. General: Well Developed, well nourished, and in no acute distress.   MSK: ***    Lab and Radiology Results No results found for this or any previous visit (from the past 72 hour(s)). No results found.     Assessment and Plan: 74 y.o. female with ***   PDMP not reviewed this encounter. No orders of the defined types were placed in this encounter.  No orders of the defined types were placed in this encounter.    Discussed warning signs or symptoms. Please see discharge instructions. Patient expresses understanding.   ***

## 2019-01-30 NOTE — Telephone Encounter (Signed)
Pt was talking about water pill and cholesterol medication that was previously told not to take again. Pt informed to increase her water intake and we will recheck at next OV

## 2019-01-31 ENCOUNTER — Ambulatory Visit (HOSPITAL_COMMUNITY)
Admission: RE | Admit: 2019-01-31 | Discharge: 2019-01-31 | Disposition: A | Discharge status: Home / Self Care | Payer: Medicare HMO | Source: Ambulatory Visit | Attending: Cardiovascular Disease | Admitting: Cardiovascular Disease

## 2019-01-31 DIAGNOSIS — M79605 Pain in left leg: Secondary | ICD-10-CM | POA: Insufficient documentation

## 2019-02-01 ENCOUNTER — Telehealth: Payer: Self-pay

## 2019-02-01 NOTE — Telephone Encounter (Signed)
Copied from [place] [id number]. Topic: General - Inquiry >> Feb 01, 2019 11:46 AM Mathis Bud wrote: Reason for CRM: Patient is leaving a message for PCP that she pumped up her water to 96oz a day.  Patient is requesting if she can take a half a water pill. Call back [phone]

## 2019-02-01 NOTE — Progress Notes (Signed)
No DVT present.

## 2019-02-01 NOTE — Telephone Encounter (Signed)
Please advise 

## 2019-02-03 NOTE — Telephone Encounter (Signed)
I do not see a water pill on her medication list.  Thanks

## 2019-02-06 NOTE — Telephone Encounter (Signed)
Pt use to be on Lasix 40mg  and would like to know is she can restart that medication

## 2019-02-06 NOTE — Telephone Encounter (Signed)
There is no need to take it, unless she has swelling on the legs.  Thank you

## 2019-02-07 NOTE — Telephone Encounter (Signed)
Pt.notified

## 2019-02-08 DIAGNOSIS — N8111 Cystocele, midline: Secondary | ICD-10-CM | POA: Diagnosis not present

## 2019-02-10 ENCOUNTER — Other Ambulatory Visit: Payer: Self-pay

## 2019-02-10 ENCOUNTER — Encounter: Payer: Self-pay | Admitting: Sports Medicine

## 2019-02-10 ENCOUNTER — Ambulatory Visit (INDEPENDENT_AMBULATORY_CARE_PROVIDER_SITE_OTHER): Payer: Medicare HMO | Admitting: Sports Medicine

## 2019-02-10 DIAGNOSIS — E1142 Type 2 diabetes mellitus with diabetic polyneuropathy: Secondary | ICD-10-CM

## 2019-02-10 DIAGNOSIS — B351 Tinea unguium: Secondary | ICD-10-CM | POA: Diagnosis not present

## 2019-02-10 DIAGNOSIS — M79675 Pain in left toe(s): Secondary | ICD-10-CM

## 2019-02-10 DIAGNOSIS — M79674 Pain in right toe(s): Secondary | ICD-10-CM | POA: Diagnosis not present

## 2019-02-10 NOTE — Progress Notes (Signed)
Subjective: Tiffany Gibbs is a 74 y.o. female patient with history of diabetes who returns to office today complaining of long,mildly painful nails  while ambulating in shoes; unable to trim. Patient states that the glucose reading this morning was not recorded last A1c 5.4 and last saw her primary care doctor, Dr. Alain Marion a few weeks ago. No other issues.   Patient Active Problem List   Diagnosis Date Noted  . Cystocele with prolapse 11/01/2018  . Cramp of limb 08/16/2018  . 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 Obstructive sleep apnea treated with continuous positive airway pressure (CPAP) 06/04/2017  . Degenerative arthritis of knee, bilateral 05/25/2017  . Left lumbar radiculopathy 04/27/2017  . Right knee pain 04/27/2017  . Tick bite 08/18/2016  . Drusen of macula of both eyes 05/04/2016  . Dry eyes, bilateral 05/04/2016  . Pseudophakia of both eyes 05/04/2016  . PVD (posterior vitreous detachment), both eyes 05/04/2016  . Dupuytren's disease of palm 04/01/2016  . Degenerative disc disease, lumbar 12/04/2015  . Elevated hemoglobin ([place]) 04/17/2015  . Occipital neuralgia of right side 04/17/2015  . Well adult exam 11/19/2014  . Asthmatic bronchitis 10/15/2014  . Neck pain on right side 10/15/2014  . Acute sinusitis 09/26/2014  . Dyslipidemia 06/18/2014  . Fracture of middle phalanx of finger 05/02/2014  . Hand pain, right 04/19/2014  . DM2 (diabetes mellitus, type 2) ([place]) 04/19/2014  . Hematoma of leg 12/15/2013  . Phlebitis of superficial vein of lower extremity 11/30/2013  . Otitis, externa, infective 11/30/2013  . Onychomycosis 08/30/2013  . Lower leg pain 05/10/2013  . Corn or callus 02/09/2013  . Acute posthemorrhagic anemia 01/19/2013  . Constipation 12/16/2012  . Left hip pain 12/16/2012  . Osteoarthritis of hip Left 11/27/2012  . Rash and nonspecific skin eruption 07/27/2012  . Cough 03/31/2012  . Diarrhea 02/05/2012  .  Microhematuria 09/09/2011  . Elevated LFTs 09/09/2011  . B12 deficiency 06/03/2011  . Cellulitis 03/10/2011  . Edema 03/10/2011  . Paresthesia of arm 02/25/2011  . Memory difficulty 11/03/2010  . Fibromyalgia syndrome 07/10/2010  . OSA (obstructive sleep apnea) 07/02/2010  . SIALADENITIS 11/25/2009  . TOBACCO USE, QUIT 04/04/2009  . ALLERGIC RHINITIS CAUSE UNSPECIFIED 10/04/2008  . URTICARIA 10/04/2008  . URINARY RETENTION 09/27/2008  . HIP PAIN 07/17/2008  . CERVICAL RADICULITIS 07/21/2007  . Headache 07/21/2007  . Osteoporosis 02/16/2007  . LOW BACK PAIN 12/27/2006  . Obesity 08/07/2006  . Anxiety 08/07/2006  . Depression 08/07/2006  . Hypertension, essential 08/07/2006  . OSTEOARTHRITIS 08/07/2006   Current Outpatient Medications on File Prior to Visit  Medication Sig Dispense Refill  . baclofen (LIORESAL) 10 MG tablet Take 1-2 tablets (10-20 mg total) by mouth at bedtime as needed for muscle spasms. 40 each 0  . ciclopirox (PENLAC) 8 % solution APPLY OVER NAIL AND SURROUNDING SKIN DAILY AT BEDTIME. APPLY DAILY OVER PREVIOUS COAT. AFTER 7 DAYS MAY REMOVE WITH ALCOHOL AND CONTINUE CYCLE 6.6 mL 1  . diphenhydrAMINE (BENADRYL ALLERGY) 25 mg capsule Take 25 mg by mouth daily as needed (allergies).     [name] escitalopram (LEXAPRO) 5 MG tablet Take 1 tablet (5 mg total) by mouth daily. 90 tablet 3  . fluticasone (FLONASE) 50 MCG/ACT nasal spray Place 1 spray into both nostrils daily as needed for allergies or rhinitis.    [name] LORazepam (ATIVAN) 1 MG tablet take 1 tablet by mouth twice a day as needed for anxiety  60 tablet 5  .  Magnesium 400 MG TABS Take 1 tablet by mouth daily. 100 tablet 3  . metFORMIN (GLUCOPHAGE) 500 MG tablet Take 1 tablet (500 mg total) by mouth 2 (two) times daily with a meal. 180 tablet 3  . phentermine (ADIPEX-P) 37.5 MG tablet Take 1 tablet (37.5 mg total) by mouth daily before breakfast. 30 tablet 2  . potassium chloride (KLOR-CON) 8 MEQ tablet Take 1 tablet (8  mEq total) by mouth 2 (two) times daily. 180 tablet 3  . Probiotic Product (PROBIOTIC PO) Take by mouth 2 (two) times daily.     No current facility-administered medications on file prior to visit.   Allergies  Allergen Reactions  . Celecoxib     swell  . Enalapril     cough  . Losartan     falls  . Lovastatin     REACTION: aches  . Naproxen   . Pravachol [Pravastatin Sodium]     Nausea   . Zetia [Ezetimibe]     Myalgias in LEs    Recent Results (from the past 2160 hour(s))  Lipid panel     Status: Abnormal   Collection Time: 01/19/19 10:22 AM  Result Value Ref Range   Cholesterol 214 (H) 0 - 200 mg/dL    Comment: ATP III Classification       Desirable:  < 200 mg/dL               Borderline High:  200 - 239 mg/dL          High:  > = 240 mg/dL   Triglycerides 87.0 0.0 - 149.0 mg/dL    Comment: Normal:  <150 mg/dLBorderline High:  150 - 199 mg/dL   HDL 66.60 >39.00 mg/dL   VLDL 17.4 0.0 - 40.0 mg/dL   LDL Cholesterol 130 (H) 0 - 99 mg/dL   Total CHOL/HDL Ratio 3     Comment:                Men          Women1/2 Average Risk     3.4          3.3Average Risk          5.0          4.42X Average Risk          9.6          7.13X Average Risk          15.0          11.0                       NonHDL 147.55     Comment: NOTE:  Non-HDL goal should be 30 mg/dL higher than patient's LDL goal (i.e. LDL goal of < 70 mg/dL, would have non-HDL goal of < 100 mg/dL)  VITAMIN D 25 Hydroxy (Vit-D Deficiency, Fractures)     Status: None   Collection Time: 01/19/19 10:22 AM  Result Value Ref Range   VITD 49.04 30.00 - 100.00 ng/mL  Vitamin B12     Status: None   Collection Time: 01/19/19 10:22 AM  Result Value Ref Range   Vitamin B-12 405 211 - 911 pg/mL  TSH     Status: None   Collection Time: 01/19/19 10:22 AM  Result Value Ref Range   TSH 0.79 0.35 - 4.50 uIU/mL  Hepatic function panel     Status: None   Collection Time: 01/19/19 10:22 AM  Result Value Ref Range   Total Bilirubin 0.8  0.2 - 1.2 mg/dL   Bilirubin, Direct 0.1 0.0 - 0.3 mg/dL   Alkaline Phosphatase 65 39 - 117 U/L   AST 14 0 - 37 U/L   ALT 9 0 - 35 U/L   Total Protein 7.1 6.0 - 8.3 g/dL   Albumin 4.6 3.5 - 5.2 g/dL  Hemoglobin A1c     Status: None   Collection Time: 01/19/19 10:22 AM  Result Value Ref Range   Hgb A1c MFr Bld 5.4 4.6 - 6.5 %    Comment: Glycemic Control Guidelines for People with Diabetes:Non Diabetic:  <6%Goal of Therapy: <7%Additional Action Suggested:  123456   Basic Metabolic Panel (BMET)     Status: Abnormal   Collection Time: 01/19/19 10:22 AM  Result Value Ref Range   Sodium 139 135 - 145 mEq/L   Potassium 3.5 3.5 - 5.1 mEq/L   Chloride 102 96 - 112 mEq/L   CO2 27 19 - 32 mEq/L   Glucose, Bld 95 70 - 99 mg/dL   BUN 23 6 - 23 mg/dL   Creatinine, Ser 1.16 0.40 - 1.20 mg/dL   GFR 45.72 (L) >60.00 mL/min   Calcium 9.7 8.4 - 10.5 mg/dL  CBC with Differential     Status: None   Collection Time: 01/19/19 10:22 AM  Result Value Ref Range   WBC 6.7 4.0 - 10.5 K/uL   RBC 4.59 3.87 - 5.11 Mil/uL   Hemoglobin 13.7 12.0 - 15.0 g/dL   HCT 41.0 36.0 - 46.0 %   MCV 89.3 78.0 - 100.0 fl   MCHC 33.4 30.0 - 36.0 g/dL   RDW 13.1 11.5 - 15.5 %   Platelets 269.0 150.0 - 400.0 K/uL   Neutrophils Relative % 65.4 43.0 - 77.0 %   Lymphocytes Relative 25.3 12.0 - 46.0 %   Monocytes Relative 7.2 3.0 - 12.0 %   Eosinophils Relative 1.3 0.0 - 5.0 %   Basophils Relative 0.8 0.0 - 3.0 %   Neutro Abs 4.4 1.4 - 7.7 K/uL   Lymphs Abs 1.7 0.7 - 4.0 K/uL   Monocytes Absolute 0.5 0.1 - 1.0 K/uL   Eosinophils Absolute 0.1 0.0 - 0.7 K/uL   Basophils Absolute 0.1 0.0 - 0.1 K/uL  Urinalysis, Routine w reflex microscopic     Status: Abnormal   Collection Time: 01/19/19 10:54 AM  Result Value Ref Range   Color, Urine YELLOW Yellow;Lt. Yellow;Straw;Dark Yellow;Amber;Green;Red;Brown   APPearance Sl Cloudy (A) Clear;Turbid;Slightly Cloudy;Cloudy   Specific Gravity, Urine 1.025 1.000 - 1.030   pH 6.0 5.0 - 8.0    Total Protein, Urine NEGATIVE Negative   Urine Glucose NEGATIVE Negative   Ketones, ur 15 (A) Negative   Bilirubin Urine NEGATIVE Negative   Hgb urine dipstick SMALL (A) Negative   Urobilinogen, UA 0.2 0.0 - 1.0   Leukocytes,Ua SMALL (A) Negative   Nitrite NEGATIVE Negative   WBC, UA 3-6/hpf (A) 0-2/hpf   RBC / HPF 0-2/hpf 0-2/hpf   Mucus, UA Presence of (A) None   Squamous Epithelial / LPF Rare(0-4/hpf) Rare(0-4/hpf)    Objective: General: Patient is awake, alert, and oriented x 3 and in no acute distress.  Integument: Skin is warm, dry and supple bilateral. Nails are tender, long, thickened and dystrophic with subungual debris, consistent with onychomycosis, 1-5 bilateral with bilateral hallux most involved. No signs of infection. No open lesions or preulcerative lesions present bilateral. Remaining integument unremarkable.  Vasculature:  Dorsalis Pedis pulse  1/4 bilateral. Posterior Tibial pulse  1/4 bilateral.  Capillary fill time <3 sec 1-5 bilateral. Positive hair growth to the level of the digits. Temperature gradient within normal limits.  Minimal varicosities present bilateral. No edema present bilateral.   Neurology: The patient has intact sensation measured with a 5.07/10g Semmes Weinstein Monofilament at all pedal sites bilateral. Vibratory sensation diminished bilateral with tuning fork. No Babinski sign present bilateral.  Subjective cramping occasionally in both feet and lower extremities.  Musculoskeletal: Asymptomatic hammertoe pedal deformities noted bilateral. Muscular strength 5/5 in all lower extremity muscular groups bilateral without pain on range of motion . No tenderness with calf compression bilateral.  Assessment and Plan: Problem List Items Addressed This Visit    None    Visit Diagnoses    Pain due to onychomycosis of toenails of both feet    -  Primary   Diabetic polyneuropathy associated with type 2 diabetes mellitus ([place])         -Examined  patient. -Re-discussed  diabetic foot care and importance of daily inspection   -Mechanically debrided all nails 1-5 bilateral using sterile nail nipper and filed with dremel without incident  -Continue with penlac or Tea tree oil like previous to nails  -Answered all patient questions -Patient to return  in 3 months for at risk foot care  -Patient advised to call the office if any problems or questions arise in the meantime.  Landis Martins, DPM

## 2019-02-17 ENCOUNTER — Other Ambulatory Visit: Payer: Self-pay | Admitting: Family Medicine

## 2019-02-21 ENCOUNTER — Other Ambulatory Visit: Payer: Self-pay | Admitting: Family Medicine

## 2019-02-21 DIAGNOSIS — G4733 Obstructive sleep apnea (adult) (pediatric): Secondary | ICD-10-CM | POA: Diagnosis not present

## 2019-02-21 NOTE — Telephone Encounter (Signed)
Please see refill request and advise.

## 2019-02-24 ENCOUNTER — Telehealth: Payer: Self-pay | Admitting: Family Medicine

## 2019-02-24 NOTE — Telephone Encounter (Signed)
Patient called stating that she has still been having the leg cramps. She has been drinking 96 ounces of water and taking her medication that was prescribed. She mentioned that last night, her left leg behind her knee was hurting. So the pain seems to move around. She has been wearing compression stockings and asked if she can wear them at night also? A friend of hers told her about a magnesium spray that she applies to her legs that seems to help the cramps but only if she can catch it in time before it gets bad. Please advise.  She also said that about 10 years ago when she had her hip surgery, they wrapped some sort of stocking around her leg that also massaged them. She thought maybe this might help. Do you know of anything like this that she could try or where she could get it?

## 2019-02-27 NOTE — Telephone Encounter (Signed)
Returned pt's call and relayed Dr. Clovis Riley advice.  Pt verbalizes understanding and does not have any additional questions/concerns.  Also remind her about her f/u appt next Monday 03/06/19.

## 2019-02-27 NOTE — Telephone Encounter (Signed)
Okay to try the compression stockings at night but if not helpful discontinue them. Magnesium spray is okay. We can discuss other options at follow-up on 22 February.  I do not think you well need the kind of wrapping you are describing.

## 2019-03-06 ENCOUNTER — Other Ambulatory Visit (INDEPENDENT_AMBULATORY_CARE_PROVIDER_SITE_OTHER): Payer: Medicare HMO

## 2019-03-06 ENCOUNTER — Other Ambulatory Visit: Payer: Self-pay

## 2019-03-06 ENCOUNTER — Ambulatory Visit (INDEPENDENT_AMBULATORY_CARE_PROVIDER_SITE_OTHER): Payer: Medicare HMO | Admitting: Family Medicine

## 2019-03-06 ENCOUNTER — Encounter: Payer: Self-pay | Admitting: Family Medicine

## 2019-03-06 ENCOUNTER — Telehealth: Payer: Self-pay | Admitting: Internal Medicine

## 2019-03-06 VITALS — BP 110/66 | HR 90 | Ht 63.0 in | Wt 143.2 lb

## 2019-03-06 DIAGNOSIS — M791 Myalgia, unspecified site: Secondary | ICD-10-CM

## 2019-03-06 DIAGNOSIS — M79605 Pain in left leg: Secondary | ICD-10-CM

## 2019-03-06 DIAGNOSIS — R252 Cramp and spasm: Secondary | ICD-10-CM

## 2019-03-06 DIAGNOSIS — N1831 Chronic kidney disease, stage 3a: Secondary | ICD-10-CM

## 2019-03-06 LAB — COMPLETE METABOLIC PANEL WITH GFR
AG Ratio: 1.9 (calc) (ref 1.0–2.5)
ALT: 11 U/L (ref 6–29)
AST: 16 U/L (ref 10–35)
Albumin: 4.5 g/dL (ref 3.6–5.1)
Alkaline phosphatase (APISO): 61 U/L (ref 37–153)
BUN/Creatinine Ratio: 22 (calc) (ref 6–22)
BUN: 22 mg/dL (ref 7–25)
CO2: 25 mmol/L (ref 20–32)
Calcium: 9.8 mg/dL (ref 8.6–10.4)
Chloride: 103 mmol/L (ref 98–110)
Creat: 1.01 mg/dL — ABNORMAL HIGH (ref 0.60–0.93)
GFR, Est African American: 64 mL/min/{1.73_m2} (ref >=60)
GFR, Est Non African American: 55 mL/min/{1.73_m2} — ABNORMAL LOW (ref >=60)
Globulin: 2.4 g/dL (calc) (ref 1.9–3.7)
Glucose, Bld: 117 mg/dL — ABNORMAL HIGH (ref 65–99)
Potassium: 3.9 mmol/L (ref 3.5–5.3)
Sodium: 140 mmol/L (ref 135–146)
Total Bilirubin: 0.5 mg/dL (ref 0.2–1.2)
Total Protein: 6.9 g/dL (ref 6.1–8.1)

## 2019-03-06 LAB — SEDIMENTATION RATE: Sed Rate: 23 mm/hr (ref 0–30)

## 2019-03-06 NOTE — Progress Notes (Signed)
I, Tiffany Gibbs, LAT, ATC, am serving as scribe for Dr. Lynne Leader.  Tiffany Gibbs is a 74 y.o. female who presents to [facility] today for f/u of B lower leg pain, cramps and spasms (L>R).  She was last seen by Dr. Georgina Snell on 01/30/19 and was referred for a venous doppler US to r/o DVT on 01/31/19.  She has been wearing compression socks, drinking plenty of water and using a Mg spray.  Since her last visit, pt reports that she feels a little better compared to her last visit.  She con't to wear her compression socks during the day but was unable to tolerate those at night.  She does note some numb tingly sensation to her feet bilaterally as well.    Pertinent review of systems: No fevers or chills.  Relevant historical information: History of sleep apnea.  History of hypertension.  History of diabetes.   Exam:  BP 110/66 (BP Location: Left Arm, Patient Position: Sitting, Cuff Size: Normal)   Pulse 90   Ht 5\' 3"  (1.6 m)   Wt 143 lb 3.2 oz (65 kg)   SpO2 99%   BMI 25.37 kg/m  General: Well Developed, well nourished, and in no acute distress.   MSK:  Legs bilaterally no significant effusion or edema.  Normal motion and gait.  Normal strength.    Lab and Radiology Results VAS [place] LOWER EXTREMITY VENOUS (DVT)  Result Date: 01/31/2019  Lower Venous Study Indications: Pain. Other Indications: Patient complains of intermittent left calf cramping at night                    and during the day. She denies any shortness of breath. Performing Technologist: Wilkie Aye RVT  Examination Guidelines: A complete evaluation includes B-mode imaging, spectral Doppler, color Doppler, and power Doppler as needed of all accessible portions of each vessel. Bilateral testing is considered an integral part of a complete examination. Limited examinations for reoccurring indications may be performed as noted.   +-----+---------------+---------+-----------+----------+--------------+ RIGHTCompressibilityPhasicitySpontaneityPropertiesThrombus Aging +-----+---------------+---------+-----------+----------+--------------+ CFV  Full           Yes      Yes                                 +-----+---------------+---------+-----------+----------+--------------+  +---------+---------------+---------+-----------+----------+--------------+ LEFT     CompressibilityPhasicitySpontaneityPropertiesThrombus Aging +---------+---------------+---------+-----------+----------+--------------+ CFV      Full           Yes      Yes                                 +---------+---------------+---------+-----------+----------+--------------+ SFJ      Full           Yes      Yes                                 +---------+---------------+---------+-----------+----------+--------------+ FV Prox  Full           Yes      Yes                                 +---------+---------------+---------+-----------+----------+--------------+ FV Mid   Full  Yes      Yes                                 +---------+---------------+---------+-----------+----------+--------------+ FV DistalFull           Yes      Yes                                 +---------+---------------+---------+-----------+----------+--------------+ PFV      Full                                                        +---------+---------------+---------+-----------+----------+--------------+ POP      Full           Yes      Yes                                 +---------+---------------+---------+-----------+----------+--------------+ PTV      Full           Yes      Yes                                 +---------+---------------+---------+-----------+----------+--------------+ PERO     Full           Yes      Yes                                  +---------+---------------+---------+-----------+----------+--------------+ Gastroc  Full                                                        +---------+---------------+---------+-----------+----------+--------------+ GSV      Full           Yes      Yes                                 +---------+---------------+---------+-----------+----------+--------------+  Summary: Right: No evidence of common femoral vein obstruction. Left: No evidence of deep vein thrombosis in the lower extremity. No indirect evidence of obstruction proximal to the inguinal ligament. No cystic structure found in the popliteal fossa.  *See table(s) above for measurements and observations. Electronically signed by Ena Dawley MD on 01/31/2019 at 2:27:42 PM.    Final      Chemistry      Component Value Date/Time   NA 139 01/19/2019 1022   K 3.5 01/19/2019 1022   CL 102 01/19/2019 1022   CO2 27 01/19/2019 1022   BUN 23 01/19/2019 1022   CREATININE 1.16 01/19/2019 1022      Component Value Date/Time   CALCIUM 9.7 01/19/2019 1022   ALKPHOS 65 01/19/2019 1022   AST 14 01/19/2019 1022   ALT 9 01/19/2019 1022   BILITOT 0.8 01/19/2019 1022     Lab Results  Component  Value Date   TSH 0.79 01/19/2019       Assessment and Plan: 75 y.o. female with cramping mostly at bedtime bilateral lower extremities.  Unclear etiology.  Cramping has multiple different possibilities.  Has had some work-up already with normal TSH and normal potassium and sodium in January. Will expand work-up and recheck metabolic panel including calcium and magnesium.  Also will check CK and sed rate.  Will check PTH and ionized calcium as well. If labs are normal next step would be nerve conduction study if needed.  Total encounter time 30 minutes including charting time date of service.   PDMP not reviewed this encounter. Orders Placed This Encounter  Procedures  . PTH, Intact and Calcium    Standing Status:   Future     Number of Occurrences:   1    Standing Expiration Date:   03/05/2020    Order Specific Question:   Release to patient    Answer:   Immediate  . Sedimentation rate    Standing Status:   Future    Number of Occurrences:   1    Standing Expiration Date:   03/05/2020  . CK    Standing Status:   Future    Number of Occurrences:   1    Standing Expiration Date:   03/05/2020  . Magnesium    Standing Status:   Future    Number of Occurrences:   1    Standing Expiration Date:   05/04/2019  . COMPLETE METABOLIC PANEL WITH GFR    Standing Status:   Future    Number of Occurrences:   1    Standing Expiration Date:   06/03/2019   No orders of the defined types were placed in this encounter.    Discussed warning signs or symptoms. Please see discharge instructions. Patient expresses understanding.   The above documentation has been reviewed and is accurate and complete Lynne Leader

## 2019-03-06 NOTE — Patient Instructions (Signed)
Thank you for coming in today. Continue compression during the day.  Continue medicine and exercises.  Plan for labs.  If labs are normal I will arrange for a nerve test caled EMG/nerve conduction study.    Muscle Cramps and Spasms Muscle cramps and spasms occur when a muscle or muscles tighten and you have no control over this tightening (involuntary muscle contraction). They are a common problem and can develop in any muscle. The most common place is in the calf muscles of the leg. Muscle cramps and muscle spasms are both involuntary muscle contractions, but there are some differences between the two:  Muscle cramps are painful. They come and go and may last for a few seconds or up to 15 minutes. Muscle cramps are often more forceful and last longer than muscle spasms.  Muscle spasms may or may not be painful. They may also last just a few seconds or much longer. Certain medical conditions, such as diabetes or Parkinson's disease, can make it more likely to develop cramps or spasms. However, cramps or spasms are usually not caused by a serious underlying problem. Common causes include:  Doing more physical work or exercise than your body is ready for (overexertion).  Overuse from repeating certain movements too many times.  Remaining in a certain position for a long period of time.  Improper preparation, form, or technique while playing a sport or doing an activity.  Dehydration.  Injury.  Side effects of some medicines.  Abnormally low levels of the salts and minerals in your blood (electrolytes), especially potassium and calcium. This could happen if you are taking water pills (diuretics) or if you are pregnant. In many cases, the cause of muscle cramps or spasms is not known. Follow these instructions at home: Managing pain and stiffness      Try massaging, stretching, and relaxing the affected muscle. Do this for several minutes at a time.  If directed, apply heat to  tight or tense muscles as often as told by your health care provider. Use the heat source that your health care provider recommends, such as a moist heat pack or a heating pad. ? Place a towel between your skin and the heat source. ? Leave the heat on for 20-30 minutes. ? Remove the heat if your skin turns bright red. This is especially important if you are unable to feel pain, heat, or cold. You may have a greater risk of getting burned.  If directed, put ice on the affected area. This may help if you are sore or have pain after a cramp or spasm. ? Put ice in a plastic bag. ? Place a towel between your skin and the bag. ? Leavethe ice on for 20 minutes, 2-3 times a day.  Try taking hot showers or baths to help relax tight muscles. Eating and drinking  Drink enough fluid to keep your urine pale yellow. Staying well hydrated may help prevent cramps or spasms.  Eat a healthy diet that includes plenty of nutrients to help your muscles function. A healthy diet includes fruits and vegetables, lean protein, whole grains, and low-fat or nonfat dairy products. General instructions  If you are having frequent cramps, avoid intense exercise for several days.  Take over-the-counter and prescription medicines only as told by your health care provider.  Pay attention to any changes in your symptoms.  Keep all follow-up visits as told by your health care provider. This is important. Contact a health care provider if:  Your cramps  or spasms get more severe or happen more often.  Your cramps or spasms do not improve over time. Summary  Muscle cramps and spasms occur when a muscle or muscles tighten and you have no control over this tightening (involuntary muscle contraction).  The most common place for cramps or spasms to occur is in the calf muscles of the leg.  Massaging, stretching, and relaxing the affected muscle may relieve the cramp or spasm.  Drink enough fluid to keep your urine pale  yellow. Staying well hydrated may help prevent cramps or spasms. This information is not intended to replace advice given to you by your health care provider. Make sure you discuss any questions you have with your health care provider. Document Revised: 05/24/2017 Document Reviewed: 05/24/2017 Elsevier Patient Education  [place].

## 2019-03-06 NOTE — Telephone Encounter (Signed)
Patient wanted to informed she had her second COVID -19 vaccine on 02/13/19 @1 :20pm.

## 2019-03-06 NOTE — Telephone Encounter (Signed)
Chart updated

## 2019-03-07 LAB — PTH, INTACT AND CALCIUM
Calcium: 9.7 mg/dL (ref 8.6–10.4)
PTH: 26 pg/mL (ref 14–64)

## 2019-03-07 LAB — CK: Total CK: 75 U/L (ref 7–177)

## 2019-03-07 LAB — MAGNESIUM: Magnesium: 2.3 mg/dL (ref 1.5–2.5)

## 2019-03-07 NOTE — Progress Notes (Signed)
Kidney function is improved from 1 month ago.  Potassium is normal.  Calcium is normal.  Other labs are still pending.

## 2019-03-08 NOTE — Progress Notes (Signed)
Labs look normal.  I will order the nerve study.  Let me know if you do not hear about scheduling the nerve study.

## 2019-03-23 ENCOUNTER — Other Ambulatory Visit: Payer: Self-pay | Admitting: Internal Medicine

## 2019-03-23 ENCOUNTER — Other Ambulatory Visit: Payer: Self-pay | Admitting: Family Medicine

## 2019-03-24 ENCOUNTER — Other Ambulatory Visit: Payer: Self-pay

## 2019-03-24 DIAGNOSIS — G4733 Obstructive sleep apnea (adult) (pediatric): Secondary | ICD-10-CM | POA: Diagnosis not present

## 2019-03-24 MED ORDER — BACLOFEN 10 MG PO TABS: ORAL_TABLET | ORAL | 1 refills | Status: DC

## 2019-03-24 NOTE — Telephone Encounter (Signed)
Patient called and would like a refill on her medication

## 2019-03-24 NOTE — Telephone Encounter (Signed)
1.Medication Requested:phentermine (ADIPEX-P) 37.5 MG tablet   2. Pharmacy (Name, [place], [place]): [facility] # [place], [place]  3. On Med List: Yes   4. Last Visit with PCP: 1.7.21   5. Next visit date with PCP: 4.12.21    Agent: Please be advised that RX refills may take up to 3 business days. We ask that you follow-up with your pharmacy.

## 2019-03-27 ENCOUNTER — Telehealth: Payer: Self-pay | Admitting: Internal Medicine

## 2019-03-27 ENCOUNTER — Telehealth: Payer: Self-pay

## 2019-03-27 NOTE — Telephone Encounter (Signed)
RX sent yesterday

## 2019-03-27 NOTE — Telephone Encounter (Signed)
   Patient calling to request a kit to test blood sugar. She wanted to know if the office had a sample kit because she only plans to temporarily test. She has an upcoming procedure and was told by surgeons office she needs to test her blood sugars before having procedure.   Please call

## 2019-03-27 NOTE — Telephone Encounter (Signed)
The patient said she will be in [place] today.     1.Medication Requested:phentermine (ADIPEX-P) 37.5 MG tablet   2. Pharmacy (Name, [place]):[facility] # [place], [place]  3. On Med List: Yes    4. Last Visit with PCP:  1.7.21   5. Next visit date with PCP: 4.12.21   Agent: Please be advised that RX refills may take up to 3 business days. We ask that you follow-up with your pharmacy.

## 2019-03-27 NOTE — Telephone Encounter (Signed)
    Patient calling, states [facility] doesn't have RX.  Please resend request

## 2019-03-27 NOTE — Telephone Encounter (Signed)
Pharmacy confirmed they did not receive RX, RX given to pharmacist to fill.

## 2019-03-28 ENCOUNTER — Telehealth: Payer: Self-pay | Admitting: Internal Medicine

## 2019-03-28 MED ORDER — ACCU-CHEK SOFTCLIX LANCETS MISC: 3 refills | Status: DC

## 2019-03-28 MED ORDER — ACCU-CHEK GUIDE VI STRP: ORAL_STRIP | 3 refills | Status: DC

## 2019-03-28 NOTE — Telephone Encounter (Signed)
New message:    The patient is calling and states she needs the prescription sent over to [facility] like the samples that you put at the front for her. Sorry that's all I have because she stated she is not sure what she needs. She states she will be here on Friday to get the samples.

## 2019-03-28 NOTE — Telephone Encounter (Signed)
Pt notified meter and RX placed upfront to be picked up

## 2019-03-29 NOTE — Telephone Encounter (Signed)
New Message:   Pt is calling and would like to know if her strips request has been sent to [facility] so she can make one trip on Friday here to get medicine and what was left at the front for her here. Please advise.

## 2019-03-30 NOTE — Telephone Encounter (Signed)
Pt stated she picked up sample and RX yesterday

## 2019-03-31 DIAGNOSIS — R69 Illness, unspecified: Secondary | ICD-10-CM | POA: Diagnosis not present

## 2019-03-31 MED ORDER — ONETOUCH VERIO W/DEVICE KIT: PACK | 0 refills | Status: AC

## 2019-03-31 MED ORDER — ONETOUCH VERIO VI STRP: ORAL_STRIP | 3 refills | Status: DC

## 2019-03-31 MED ORDER — ONETOUCH ULTRASOFT LANCETS MISC: 3 refills | Status: DC

## 2019-03-31 NOTE — Telephone Encounter (Signed)
F/u   The patient calls spoke [facility] would not approve Accu-check    [employer] wants her to use One Touch Verio meters with scripts

## 2019-03-31 NOTE — Telephone Encounter (Signed)
RXs sent.

## 2019-04-03 DIAGNOSIS — R69 Illness, unspecified: Secondary | ICD-10-CM | POA: Diagnosis not present

## 2019-04-13 ENCOUNTER — Ambulatory Visit (INDEPENDENT_AMBULATORY_CARE_PROVIDER_SITE_OTHER): Payer: Medicare HMO | Admitting: Diagnostic Neuroimaging

## 2019-04-13 ENCOUNTER — Encounter (INDEPENDENT_AMBULATORY_CARE_PROVIDER_SITE_OTHER): Payer: Medicare HMO

## 2019-04-13 ENCOUNTER — Other Ambulatory Visit: Payer: Self-pay

## 2019-04-13 DIAGNOSIS — Z0289 Encounter for other administrative examinations: Secondary | ICD-10-CM

## 2019-04-13 DIAGNOSIS — M791 Myalgia, unspecified site: Secondary | ICD-10-CM

## 2019-04-13 DIAGNOSIS — R252 Cramp and spasm: Secondary | ICD-10-CM | POA: Diagnosis not present

## 2019-04-19 ENCOUNTER — Telehealth: Payer: Self-pay | Admitting: Internal Medicine

## 2019-04-19 DIAGNOSIS — N289 Disorder of kidney and ureter, unspecified: Secondary | ICD-10-CM

## 2019-04-19 NOTE — Telephone Encounter (Signed)
Patient has an appointment on Monday 4/12 for a 3 month FU.  She is requesting labs to be put it for her kidney function, she wants to see if they have improved since her LOV. Plot note for LOV "Kidney function is slightly decreased-please drink more water."She would like the lab results before her appointment.  Please advise &Will follow up with patient to set up lab appointment.

## 2019-04-20 NOTE — Telephone Encounter (Signed)
New message:    Pt is calling to check on the status of her labs being put in so she can get them drawn before her appt on Monday. Pt states she is very anxious and really wants these done before her appt. Please advise.

## 2019-04-20 NOTE — Telephone Encounter (Signed)
Labs entered, pt notified and lab appt made for tomorrow

## 2019-04-20 NOTE — Procedures (Signed)
[facility]  NCS (NERVE CONDUCTION STUDY) WITH EMG (ELECTROMYOGRAPHY) REPORT   STUDY DATE: 04/13/19 PATIENT NAME: Tiffany Gibbs DOB: 1945-07-22 MRN: [id number]  ORDERING CLINICIAN: Steva Colder, MD  TECHNOLOGIST: Sherre Scarlet ELECTROMYOGRAPHER: Earlean Polka. Marty Sadlowski, MD  CLINICAL INFORMATION: 74 year old female with bilateral foot numbness and cramps.  FINDINGS: NERVE CONDUCTION STUDY: Bilateral peroneal motor responses have decreased amplitudes and slow conduction velocities.  Bilateral tibial motor responses are normal except right tibial motor response has borderline slow conduction velocity.  Bilateral superficial peroneal and left sural sensory sponsors could not be obtained.  Right sural sensory response has decreased amplitude.  Bilateral tibial F wave latencies are normal.   NEEDLE ELECTROMYOGRAPHY:  Needle examination of right vastus medialis, tibialis anterior, gastrocnemius and right lumbar paraspinal muscles was normal.   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    INTERPRETING PHYSICIAN:  Penni Bombard, MD Certified in Neurology, Neurophysiology and Neuroimaging  [facility] [address], [place], [place] [address] [phone]   [facility]    Nerve / Sites Muscle Latency Ref. Amplitude Ref. Rel Amp Segments Distance Velocity Ref. Area    ms ms mV mV %  cm m/s m/s mVms  R Peroneal - EDB     Ankle EDB 5.2 ?6.5 1.3 ?2.0 100 Ankle - EDB 9   5.1     Fib head EDB 11.6  0.7  59.2 Fib head - Ankle 27 42 ?44 4.2     Pop fossa EDB 14.1  0.8  104 Pop fossa - Fib head 10 40 ?44 3.9         Pop fossa - Ankle      L Peroneal - EDB     Ankle EDB 7.0 ?6.5 0.1 ?2.0 100 Ankle - EDB 9   0.7     Fib head EDB 14.0  0.1  204 Fib head - Ankle 26 37 ?44 0.6     Pop fossa EDB 16.9  0.1  39.8 Pop fossa - Fib head 10 35 ?44 0.5         Pop fossa - Ankle      R Tibial - AH     Ankle AH 3.8 ?5.8  5.0 ?4.0 100 Ankle - AH 9   10.7     Pop fossa AH 12.9  3.5  70.2 Pop fossa - Ankle 35 39 ?41 14.5  L Tibial - AH     Ankle AH 4.6 ?5.8 6.7 ?4.0 100 Ankle - AH 9   12.6     Pop fossa AH 13.1  6.1  90.3 Pop fossa - Ankle 35 41 ?41 11.4             SNC    Nerve / Sites Rec. Site Peak Lat Ref.  Amp Ref. Segments Distance    ms ms V V  cm  R Sural - Ankle (Calf)     Calf Ankle 3.7 ?4.4 1 ?6 Calf - Ankle 14  L Sural - Ankle (Calf)     Calf Ankle NR ?4.4 NR ?6 Calf - Ankle 14  R Superficial peroneal - Ankle     Lat leg Ankle NR ?4.4 NR ?6 Lat leg - Ankle 14  L Superficial peroneal - Ankle     Lat leg Ankle NR ?4.4 NR ?6 Lat leg - Ankle 14             F  Wave  Nerve F Lat Ref.   ms ms  R Tibial - AH 53.3 ?56.0  L Tibial - AH 49.3 ?56.0         EMG Summary Table    Spontaneous MUAP Recruitment  Muscle IA Fib PSW Fasc Other Amp Dur. Poly Pattern  R. Vastus medialis Normal None None None _______ Normal Normal Normal Normal  R. Tibialis anterior Normal None None None _______ Normal Normal Normal Normal  R. Gastrocnemius (Medial head) Normal None None None _______ Normal Normal Normal Normal  R. Lumbar paraspinals Normal None None None _______ Normal Normal Normal Normal

## 2019-04-21 ENCOUNTER — Other Ambulatory Visit (INDEPENDENT_AMBULATORY_CARE_PROVIDER_SITE_OTHER): Payer: Medicare HMO

## 2019-04-21 ENCOUNTER — Other Ambulatory Visit: Payer: Self-pay

## 2019-04-21 DIAGNOSIS — N289 Disorder of kidney and ureter, unspecified: Secondary | ICD-10-CM | POA: Diagnosis not present

## 2019-04-21 LAB — HEPATIC FUNCTION PANEL
ALT: 14 U/L (ref 0–35)
AST: 17 U/L (ref 0–37)
Albumin: 4.5 g/dL (ref 3.5–5.2)
Alkaline Phosphatase: 57 U/L (ref 39–117)
Bilirubin, Direct: 0.1 mg/dL (ref 0.0–0.3)
Total Bilirubin: 0.4 mg/dL (ref 0.2–1.2)
Total Protein: 6.9 g/dL (ref 6.0–8.3)

## 2019-04-21 LAB — BASIC METABOLIC PANEL
BUN: 16 mg/dL (ref 6–23)
CO2: 28 mEq/L (ref 19–32)
Calcium: 9.5 mg/dL (ref 8.4–10.5)
Chloride: 103 mEq/L (ref 96–112)
Creatinine, Ser: 0.95 mg/dL (ref 0.40–1.20)
GFR: 57.53 mL/min — ABNORMAL LOW (ref >60.00)
Glucose, Bld: 91 mg/dL (ref 70–99)
Potassium: 4.1 mEq/L (ref 3.5–5.1)
Sodium: 139 mEq/L (ref 135–145)

## 2019-04-21 NOTE — Progress Notes (Signed)
Nerve conduction study does show abnormal nerve function in the legs.  Recommend visit in clinic to discuss findings in full detail.  Also probably would benefit from referral to neurology for further work-up and evaluation for this issue.

## 2019-04-24 ENCOUNTER — Telehealth: Payer: Self-pay | Admitting: Internal Medicine

## 2019-04-24 ENCOUNTER — Encounter: Payer: Self-pay | Admitting: Internal Medicine

## 2019-04-24 ENCOUNTER — Other Ambulatory Visit: Payer: Self-pay

## 2019-04-24 ENCOUNTER — Ambulatory Visit (INDEPENDENT_AMBULATORY_CARE_PROVIDER_SITE_OTHER): Payer: Medicare HMO | Admitting: Internal Medicine

## 2019-04-24 DIAGNOSIS — E538 Deficiency of other specified B group vitamins: Secondary | ICD-10-CM

## 2019-04-24 DIAGNOSIS — G63 Polyneuropathy in diseases classified elsewhere: Secondary | ICD-10-CM | POA: Diagnosis not present

## 2019-04-24 DIAGNOSIS — I10 Essential (primary) hypertension: Secondary | ICD-10-CM

## 2019-04-24 DIAGNOSIS — E889 Metabolic disorder, unspecified: Secondary | ICD-10-CM

## 2019-04-24 DIAGNOSIS — E119 Type 2 diabetes mellitus without complications: Secondary | ICD-10-CM | POA: Diagnosis not present

## 2019-04-24 DIAGNOSIS — R69 Illness, unspecified: Secondary | ICD-10-CM | POA: Diagnosis not present

## 2019-04-24 DIAGNOSIS — F3340 Major depressive disorder, recurrent, in remission, unspecified: Secondary | ICD-10-CM | POA: Diagnosis not present

## 2019-04-24 LAB — POCT GLYCOSYLATED HEMOGLOBIN (HGB A1C): Hemoglobin A1C: 5.2 % (ref 4.0–5.6)

## 2019-04-24 NOTE — Assessment & Plan Note (Signed)
On B12 

## 2019-04-24 NOTE — Patient Instructions (Addendum)
   Cabbage soup recipe that will not make you gain weight: Take 1 small head of cabbage, 1 average pack of celery, 4 green peppers, 4 onions, 2 cans diced tomatoes (they are not available without salt), salt and spices to taste.  Chop cabbage, celery, peppers and onions.  And tomatoes and 2-2.5 liters (2.5 quarts) of water so that it would just cover the vegetables.  Bring to boil.  Add spices and salt.  Turn heat to low/medium and simmer for 20-25 minutes.  Naturally, you can make a smaller batch and change some of the ingredients.  Lab Results  Component Value Date   CREATININE 0.95 04/21/2019   Lab Results  Component Value Date   HGBA1C 5.4 01/19/2019

## 2019-04-24 NOTE — Telephone Encounter (Signed)
° ° °  Patient requesting a copy of recent labs be mailed to her.

## 2019-04-24 NOTE — Assessment & Plan Note (Addendum)
Well controlled Furosemide prn

## 2019-04-24 NOTE — Assessment & Plan Note (Signed)
OV w/Dr [facility] tomorrow

## 2019-04-24 NOTE — Progress Notes (Signed)
Subjective:  Patient ID: Tiffany Gibbs, female    DOB: 03-22-1945  Age: 74 y.o. MRN: [id number]  CC: No chief complaint on file.   HPI ARLANA CANIZALES presents for HTN, DM, anxiety/OCD, LE neuropathy f/u  Outpatient Medications Prior to Visit  Medication Sig Dispense Refill  . baclofen (LIORESAL) 10 MG tablet TAKE one - TWO TABLETS BY MOUTH DAILY AT BEDTIME AS NEEDED 40 tablet 1  . Blood Glucose Monitoring Suppl (ONETOUCH VERIO) w/Device KIT Use to check blood sugar daily. DX E11.9 1 kit 0  . ciclopirox (PENLAC) 8 % solution APPLY OVER NAIL AND SURROUNDING SKIN DAILY AT BEDTIME. APPLY DAILY OVER PREVIOUS COAT. AFTER 7 DAYS MAY REMOVE WITH ALCOHOL AND CONTINUE CYCLE 6.6 mL 1  . diphenhydrAMINE (BENADRYL ALLERGY) 25 mg capsule Take 25 mg by mouth daily as needed (allergies).     [name] escitalopram (LEXAPRO) 5 MG tablet Take 1 tablet (5 mg total) by mouth daily. 90 tablet 3  . fluticasone (FLONASE) 50 MCG/ACT nasal spray Place 1 spray into both nostrils daily as needed for allergies or rhinitis.    [name] glucose blood (ONETOUCH VERIO) test strip Use to check blood sugar daily. DX E11.9 100 each 3  . Lancets (ONETOUCH ULTRASOFT) lancets Use to check blood sugar daily. DX E11.9 100 each 3  . LORazepam (ATIVAN) 1 MG tablet take 1 tablet by mouth twice a day as needed for anxiety  60 tablet 5  . Magnesium 400 MG TABS Take 1 tablet by mouth daily. 100 tablet 3  . metFORMIN (GLUCOPHAGE) 500 MG tablet Take 1 tablet (500 mg total) by mouth 2 (two) times daily with a meal. 180 tablet 3  . phentermine (ADIPEX-P) 37.5 MG tablet TAKE ONE TABLET BY MOUTH ONE TIME DAILY before breakfast 30 tablet 1  . potassium chloride (KLOR-CON) 8 MEQ tablet Take 1 tablet (8 mEq total) by mouth 2 (two) times daily. 180 tablet 3  . Probiotic Product (PROBIOTIC PO) Take by mouth 2 (two) times daily.     No facility-administered medications prior to visit.    ROS: Review of Systems  Constitutional: Negative for activity  change, appetite change, chills, fatigue and unexpected weight change.  HENT: Negative for congestion, mouth sores and sinus pressure.   Eyes: Negative for visual disturbance.  Respiratory: Negative for cough and chest tightness.   Gastrointestinal: Negative for abdominal pain and nausea.  Genitourinary: Negative for difficulty urinating, frequency and vaginal pain.  Musculoskeletal: Negative for back pain and gait problem.  Skin: Negative for pallor and rash.  Neurological: Negative for dizziness, tremors, weakness, numbness and headaches.  Psychiatric/Behavioral: Negative for confusion and sleep disturbance.    Objective:  BP 128/74 (BP Location: Left Arm, Patient Position: Sitting, Cuff Size: Normal)   Pulse 76   Temp 98.4 F (36.9 C) (Oral)   Ht 5' 3" (1.6 m)   Wt 141 lb (64 kg)   SpO2 99%   BMI 24.98 kg/m   BP Readings from Last 3 Encounters:  04/24/19 128/74  03/06/19 110/66  01/30/19 112/70    Wt Readings from Last 3 Encounters:  04/24/19 141 lb (64 kg)  03/06/19 143 lb 3.2 oz (65 kg)  01/30/19 141 lb 3.2 oz (64 kg)    Physical Exam Constitutional:      General: She is not in acute distress.    Appearance: She is well-developed.  HENT:     Head: Normocephalic.     Right Ear: External ear normal.  Left Ear: External ear normal.     Nose: Nose normal.  Eyes:     General:        Right eye: No discharge.        Left eye: No discharge.     Conjunctiva/sclera: Conjunctivae normal.     Pupils: Pupils are equal, round, and reactive to light.  Neck:     Thyroid: No thyromegaly.     Vascular: No JVD.     Trachea: No tracheal deviation.  Cardiovascular:     Rate and Rhythm: Normal rate and regular rhythm.     Heart sounds: Normal heart sounds.  Pulmonary:     Effort: No respiratory distress.     Breath sounds: No stridor. No wheezing.  Abdominal:     General: Bowel sounds are normal. There is no distension.     Palpations: Abdomen is soft. There is no  mass.     Tenderness: There is no abdominal tenderness. There is no guarding or rebound.  Musculoskeletal:        General: No tenderness.     Cervical back: Normal range of motion and neck supple.  Lymphadenopathy:     Cervical: No cervical adenopathy.  Skin:    Findings: No erythema or rash.  Neurological:     Cranial Nerves: No cranial nerve deficit.     Motor: No abnormal muscle tone.     Coordination: Coordination normal.     Deep Tendon Reflexes: Reflexes normal.  Psychiatric:        Behavior: Behavior normal.        Thought Content: Thought content normal.        Judgment: Judgment normal.     Lab Results  Component Value Date   WBC 6.7 01/19/2019   HGB 13.7 01/19/2019   HCT 41.0 01/19/2019   PLT 269.0 01/19/2019   GLUCOSE 91 04/21/2019   CHOL 214 (H) 01/19/2019   TRIG 87.0 01/19/2019   HDL 66.60 01/19/2019   LDLDIRECT 148.2 02/03/2013   LDLCALC 130 (H) 01/19/2019   ALT 14 04/21/2019   AST 17 04/21/2019   NA 139 04/21/2019   K 4.1 04/21/2019   CL 103 04/21/2019   CREATININE 0.95 04/21/2019   BUN 16 04/21/2019   CO2 28 04/21/2019   TSH 0.79 01/19/2019   INR 1.0 10/28/2018   HGBA1C 5.4 01/19/2019   MICROALBUR 0.9 12/23/2017    VAS [place] LOWER EXTREMITY VENOUS (DVT)  Result Date: 01/31/2019  Lower Venous Study Indications: Pain. Other Indications: Patient complains of intermittent left calf cramping at night                    and during the day. She denies any shortness of breath. Performing Technologist: Wilkie Aye RVT  Examination Guidelines: A complete evaluation includes B-mode imaging, spectral Doppler, color Doppler, and power Doppler as needed of all accessible portions of each vessel. Bilateral testing is considered an integral part of a complete examination. Limited examinations for reoccurring indications may be performed as noted.  +-----+---------------+---------+-----------+----------+--------------+  RIGHTCompressibilityPhasicitySpontaneityPropertiesThrombus Aging +-----+---------------+---------+-----------+----------+--------------+ CFV  Full           Yes      Yes                                 +-----+---------------+---------+-----------+----------+--------------+  +---------+---------------+---------+-----------+----------+--------------+ LEFT     CompressibilityPhasicitySpontaneityPropertiesThrombus Aging +---------+---------------+---------+-----------+----------+--------------+ CFV      Full  Yes      Yes                                 +---------+---------------+---------+-----------+----------+--------------+ SFJ      Full           Yes      Yes                                 +---------+---------------+---------+-----------+----------+--------------+ FV Prox  Full           Yes      Yes                                 +---------+---------------+---------+-----------+----------+--------------+ FV Mid   Full           Yes      Yes                                 +---------+---------------+---------+-----------+----------+--------------+ FV DistalFull           Yes      Yes                                 +---------+---------------+---------+-----------+----------+--------------+ PFV      Full                                                        +---------+---------------+---------+-----------+----------+--------------+ POP      Full           Yes      Yes                                 +---------+---------------+---------+-----------+----------+--------------+ PTV      Full           Yes      Yes                                 +---------+---------------+---------+-----------+----------+--------------+ PERO     Full           Yes      Yes                                 +---------+---------------+---------+-----------+----------+--------------+ Gastroc  Full                                                         +---------+---------------+---------+-----------+----------+--------------+ GSV      Full           Yes      Yes                                 +---------+---------------+---------+-----------+----------+--------------+  Summary: Right: No evidence of common femoral vein obstruction. Left: No evidence of deep vein thrombosis in the lower extremity. No indirect evidence of obstruction proximal to the inguinal ligament. No cystic structure found in the popliteal fossa.  *See table(s) above for measurements and observations. Electronically signed by Ena Dawley MD on 01/31/2019 at 2:27:42 PM.    Final     Assessment & Plan:   There are no diagnoses linked to this encounter.   No orders of the defined types were placed in this encounter.    Follow-up: No follow-ups on file.  Walker Kehr, MD

## 2019-04-24 NOTE — Assessment & Plan Note (Addendum)
Chronic depression/OCD On Lexapro

## 2019-04-24 NOTE — Assessment & Plan Note (Addendum)
POC Hgb A1c 5.2% Decreased GFR discussed Excellent BP control discussed

## 2019-04-25 ENCOUNTER — Ambulatory Visit (INDEPENDENT_AMBULATORY_CARE_PROVIDER_SITE_OTHER): Payer: Medicare HMO | Admitting: Family Medicine

## 2019-04-25 ENCOUNTER — Telehealth: Payer: Self-pay | Admitting: Family Medicine

## 2019-04-25 ENCOUNTER — Encounter: Payer: Self-pay | Admitting: Family Medicine

## 2019-04-25 VITALS — BP 128/78 | HR 72 | Ht 63.0 in | Wt 143.2 lb

## 2019-04-25 DIAGNOSIS — G4733 Obstructive sleep apnea (adult) (pediatric): Secondary | ICD-10-CM | POA: Diagnosis not present

## 2019-04-25 DIAGNOSIS — G63 Polyneuropathy in diseases classified elsewhere: Secondary | ICD-10-CM

## 2019-04-25 DIAGNOSIS — R252 Cramp and spasm: Secondary | ICD-10-CM

## 2019-04-25 DIAGNOSIS — G629 Polyneuropathy, unspecified: Secondary | ICD-10-CM

## 2019-04-25 DIAGNOSIS — E889 Metabolic disorder, unspecified: Secondary | ICD-10-CM | POA: Diagnosis not present

## 2019-04-25 MED ORDER — PREGABALIN 75 MG PO CAPS: 75.0000 mg | ORAL_CAPSULE | Freq: Two times a day (BID) | ORAL | 3 refills | Status: DC

## 2019-04-25 NOTE — Progress Notes (Signed)
I, [name], LAT, ATC, am serving as scribe for Dr. Lynne Leader.  Tiffany Gibbs is a 74 y.o. female who presents to [place] at [facility] today for B leg pain and cramps, L>R.  She was last seen by Dr. Georgina Snell on 03/06/19.  She has been wearing lower leg compression sleeves, drinking a lot of water and using Mg spray on her legs.  She had a venous doppler of her LEs on 01/31/19 and had a LE NCV/EMG study on 04/13/19.  She is here today to discuss the NCV/EMG results.  Since her last visit, pt reports that her B leg pain is worse and is feeling "terrible." She con't to have lower leg cramps at night while in bed.  She con't to use the Mg spray.  She con't to have numbness in her B distal lower legs.  She states that she feels like she's walking on marbles.   Pertinent review of systems: No fevers or chills  Relevant historical information: History well controlled diabetes.   Exam:  BP 128/78 (BP Location: Right Arm, Patient Position: Sitting, Cuff Size: Normal)   Pulse 72   Ht 5\' 3"  (1.6 m)   Wt 143 lb 3.2 oz (65 kg)   SpO2 99%   BMI 25.37 kg/m  General: Well Developed, well nourished, and in no acute distress.   MSK: Legs bilaterally normal-appearing. Decreased sensation at feet. Normal gait.    Lab and Radiology Results Results for orders placed or performed in visit on 04/24/19 (from the past 72 hour(s))  POCT HgB A1C     Status: Normal   Collection Time: 04/24/19 11:33 AM  Result Value Ref Range   Hemoglobin A1C 5.2 4.0 - 5.6 %   HbA1c POC (<> result, manual entry)     HbA1c, POC (prediabetic range)     HbA1c, POC (controlled diabetic range)     STUDY DATE: 04/13/19 PATIENT NAME: Tiffany Gibbs DOB: 1945/04/14 MRN: [id number]  ORDERING CLINICIAN: Steva Colder, MD  TECHNOLOGIST: Sherre Scarlet ELECTROMYOGRAPHER: Earlean Polka. Penumalli, MD  CLINICAL INFORMATION: 74 year old female with bilateral foot numbness and cramps.  FINDINGS: NERVE CONDUCTION  STUDY: Bilateral peroneal motor responses have decreased amplitudes and slow conduction velocities.  Bilateral tibial motor responses are normal except right tibial motor response has borderline slow conduction velocity.  Bilateral superficial peroneal and left sural sensory sponsors could not be obtained.  Right sural sensory response has decreased amplitude.  Bilateral tibial F wave latencies are normal.   NEEDLE ELECTROMYOGRAPHY:  Needle examination of right vastus medialis, tibialis anterior, gastrocnemius and right lumbar paraspinal muscles was normal.   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     INTERPRETING PHYSICIAN:  Penni Bombard, MD Certified in Neurology, Neurophysiology and Neuroimaging     Assessment and Plan: 74 y.o. female with neuropathy. Patient had nerve conduction study that showed axonal sensorimotor polyneuropathy. This likely explains her numbness and paresthesias. I think it also probably explains her cramping. I think it is reasonable to have a follow-up and second opinion with a neurologist at this point. So far her metabolic work-up has been largely unrevealing. Her diabetes is well controlled. Plan for symptom management. Baclofen has been marginally helpful. She notes gabapentin in the past has been not helpful. We will try Lyrica. Check back as needed. Referral to neurology ordered today.   PDMP not reviewed this encounter. Orders Placed This Encounter  Procedures  . Ambulatory referral  to Neurology    Referral Priority:   Routine    Referral Type:   Consultation    Referral Reason:   Specialty Services Required    Requested Specialty:   Neurology    Number of Visits Requested:   1   Meds ordered this encounter  Medications  . pregabalin (LYRICA) 75 MG capsule    Sig: Take 1 capsule (75 mg total) by mouth 2 (two) times daily.    Dispense:  60 capsule    Refill:  3      Discussed warning signs or symptoms. Please see discharge instructions. Patient expresses understanding.   The above documentation has been reviewed and is accurate and complete Lynne Leader

## 2019-04-25 NOTE — Telephone Encounter (Signed)
Pt states [facility] called her about the Lyrica, she was unclear what the problem was, possibly a PA was needed?

## 2019-04-25 NOTE — Patient Instructions (Signed)
Thank you for coming in today. Continue baclofen as needed at bedtime.  Try adding lyrica.  Let me know if works ok.  OK to just take it at night if you don't need it during the day.  Let me know if you don't hear from neurology.   Keep me updated.

## 2019-04-25 NOTE — Telephone Encounter (Signed)
Lab results mailed.

## 2019-04-26 NOTE — Telephone Encounter (Signed)
I gave pt the info provided but pt states that [facility] is waiting on a PA from [place] in order to fill the [facility].  [facility] phone [phone]

## 2019-04-26 NOTE — Telephone Encounter (Signed)
Called pt and LM for pt to return call to provide more specific info regarding what the issue is w/ her Lyrica rx at [place] to Dr. Georgina Snell who looked up Lyrica on [facility] which indicates that she can get the script for $13.99.  Please advise pt if she returns call.

## 2019-04-28 ENCOUNTER — Telehealth: Payer: Self-pay

## 2019-04-28 NOTE — Telephone Encounter (Signed)
Called patient and informed

## 2019-04-28 NOTE — Telephone Encounter (Signed)
I would recommend taking it just at bedtime.  If she still has dizziness that I will reduce the dose.  Ellard Artis

## 2019-04-28 NOTE — Telephone Encounter (Signed)
Patient called stating that she started the lyrica yesterday she has had about 3 pills sense getting the medication and patient wants to know if side effects could be dizziness or balance issues was walking with abnormal gait and light headed. Has been eating when taking medication patient unsure what she should do. States it did help with the leg cramping.

## 2019-05-02 ENCOUNTER — Telehealth: Payer: Self-pay | Admitting: Family Medicine

## 2019-05-02 NOTE — Telephone Encounter (Signed)
Returned pt's call and advised her to con't Lyrica per Dr. Georgina Snell.  Also inform her that he is happy to check her kidney function at a f/u visit in the future to make sure those levels remain stable.  Pt verbalizes understanding.

## 2019-05-02 NOTE — Telephone Encounter (Signed)
I think it is okay to continue taking.  Especially as it seems to be helpful.  We can always reassess kidney function in the near future to make sure they are doing okay.

## 2019-05-02 NOTE — Telephone Encounter (Signed)
Patient has some concerns about the Lyrica. She states we advised her to take 1 at night, and it does help her leg cramps. She read about the side effects and is concerned that this med will negatively impact her kidney function, which she has had trouble with before due to her DM. She would like assurance in order to continue taking.

## 2019-05-04 DIAGNOSIS — R69 Illness, unspecified: Secondary | ICD-10-CM | POA: Diagnosis not present

## 2019-05-05 ENCOUNTER — Telehealth: Payer: Self-pay | Admitting: Internal Medicine

## 2019-05-05 MED ORDER — GABAPENTIN 300 MG PO CAPS
300.0000 mg | ORAL_CAPSULE | Freq: Every evening | ORAL | 3 refills | Status: DC | PRN
Start: 2019-05-05 — End: 2019-06-21

## 2019-05-05 NOTE — Addendum Note (Signed)
Addended by: Gregor Hams on: 05/05/2019 04:54 PM   Modules accepted: Orders

## 2019-05-05 NOTE — Telephone Encounter (Signed)
Called pt and informed her of Dr. Clovis Riley advice regarding trying Gabapentin instead of Lyrica.  She states that she would a prescription sent to [facility] in [place].

## 2019-05-05 NOTE — Telephone Encounter (Signed)
Called pt and informed her of Dr. Clovis Riley advice regarding trying Gabapentin instead of Lyrica.  She states that she would a prescription sent to [facility] in [place].

## 2019-05-05 NOTE — Telephone Encounter (Signed)
Please advise 

## 2019-05-05 NOTE — Telephone Encounter (Signed)
New message:    Pt is calling and would like to know if she should take the newly advertised memory medication provided over the counter called "Prevetin". Please advise.

## 2019-05-05 NOTE — Telephone Encounter (Signed)
Reasonable to try gabapentin.  I know that you have tried in the past for other issues.  It may be helpful for this particular problem and possibly better tolerated than Lyrica.  Would you like me to send that in?

## 2019-05-05 NOTE — Telephone Encounter (Signed)
Patient called back. She tried taking the medication at bedtime but is still having the same side effects. She asked if there was something else she could try or what Dr Georgina Snell would suggest.

## 2019-05-05 NOTE — Telephone Encounter (Signed)
Gabapentin sent

## 2019-05-08 NOTE — Telephone Encounter (Signed)
Is it Prevagen? Thx

## 2019-05-10 ENCOUNTER — Other Ambulatory Visit: Payer: Self-pay

## 2019-05-10 ENCOUNTER — Ambulatory Visit (INDEPENDENT_AMBULATORY_CARE_PROVIDER_SITE_OTHER): Payer: Medicare HMO | Admitting: Sports Medicine

## 2019-05-10 ENCOUNTER — Encounter: Payer: Self-pay | Admitting: Sports Medicine

## 2019-05-10 DIAGNOSIS — M79675 Pain in left toe(s): Secondary | ICD-10-CM | POA: Diagnosis not present

## 2019-05-10 DIAGNOSIS — B351 Tinea unguium: Secondary | ICD-10-CM | POA: Diagnosis not present

## 2019-05-10 DIAGNOSIS — E1142 Type 2 diabetes mellitus with diabetic polyneuropathy: Secondary | ICD-10-CM

## 2019-05-10 DIAGNOSIS — M79674 Pain in right toe(s): Secondary | ICD-10-CM

## 2019-05-10 NOTE — Progress Notes (Signed)
Subjective: Tiffany Gibbs is a 74 y.o. female patient with history of diabetes who returns to office today complaining of long,mildly painful nails  while ambulating in shoes; unable to trim. Patient states that the glucose reading this morning was 102 last A1c 5.2 and last saw her primary care doctor, Dr. Alain Marion 1 month ago. No other issues.   Patient Active Problem List   Diagnosis Date Noted  . Cystocele with prolapse 11/01/2018  . Cramp of limb 08/16/2018  . 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 Obstructive sleep apnea treated with continuous positive airway pressure (CPAP) 06/04/2017  . Degenerative arthritis of knee, bilateral 05/25/2017  . Left lumbar radiculopathy 04/27/2017  . Right knee pain 04/27/2017  . Tick bite 08/18/2016  . Drusen of macula of both eyes 05/04/2016  . Dry eyes, bilateral 05/04/2016  . Pseudophakia of both eyes 05/04/2016  . PVD (posterior vitreous detachment), both eyes 05/04/2016  . Dupuytren's disease of palm 04/01/2016  . Degenerative disc disease, lumbar 12/04/2015  . Elevated hemoglobin ([place]) 04/17/2015  . Occipital neuralgia of right side 04/17/2015  . Well adult exam 11/19/2014  . Asthmatic bronchitis 10/15/2014  . Neck pain on right side 10/15/2014  . Acute sinusitis 09/26/2014  . Dyslipidemia 06/18/2014  . Fracture of middle phalanx of finger 05/02/2014  . Hand pain, right 04/19/2014  . DM2 (diabetes mellitus, type 2) ([place]) 04/19/2014  . Hematoma of leg 12/15/2013  . Phlebitis of superficial vein of lower extremity 11/30/2013  . Otitis, externa, infective 11/30/2013  . Onychomycosis 08/30/2013  . Lower leg pain 05/10/2013  . Corn or callus 02/09/2013  . Acute posthemorrhagic anemia 01/19/2013  . Constipation 12/16/2012  . Left hip pain 12/16/2012  . Osteoarthritis of hip Left 11/27/2012  . Rash and nonspecific skin eruption 07/27/2012  . Cough 03/31/2012  . Diarrhea 02/05/2012  . Microhematuria  09/09/2011  . Elevated LFTs 09/09/2011  . B12 deficiency 06/03/2011  . Cellulitis 03/10/2011  . Edema 03/10/2011  . Paresthesia of arm 02/25/2011  . Memory difficulty 11/03/2010  . Fibromyalgia syndrome 07/10/2010  . OSA (obstructive sleep apnea) 07/02/2010  . SIALADENITIS 11/25/2009  . TOBACCO USE, QUIT 04/04/2009  . ALLERGIC RHINITIS CAUSE UNSPECIFIED 10/04/2008  . URTICARIA 10/04/2008  . URINARY RETENTION 09/27/2008  . HIP PAIN 07/17/2008  . CERVICAL RADICULITIS 07/21/2007  . Headache 07/21/2007  . Osteoporosis 02/16/2007  . LOW BACK PAIN 12/27/2006  . Obesity 08/07/2006  . Anxiety 08/07/2006  . Depression 08/07/2006  . Hypertension, essential 08/07/2006  . OSTEOARTHRITIS 08/07/2006   Current Outpatient Medications on File Prior to Visit  Medication Sig Dispense Refill  . baclofen (LIORESAL) 10 MG tablet TAKE one - TWO TABLETS BY MOUTH DAILY AT BEDTIME AS NEEDED 40 tablet 1  . Blood Glucose Monitoring Suppl (ONETOUCH VERIO) w/Device KIT Use to check blood sugar daily. DX E11.9 1 kit 0  . ciclopirox (PENLAC) 8 % solution APPLY OVER NAIL AND SURROUNDING SKIN DAILY AT BEDTIME. APPLY DAILY OVER PREVIOUS COAT. AFTER 7 DAYS MAY REMOVE WITH ALCOHOL AND CONTINUE CYCLE 6.6 mL 1  . diphenhydrAMINE (BENADRYL ALLERGY) 25 mg capsule Take 25 mg by mouth daily as needed (allergies).     [name] escitalopram (LEXAPRO) 5 MG tablet Take 1 tablet (5 mg total) by mouth daily. 90 tablet 3  . fluticasone (FLONASE) 50 MCG/ACT nasal spray Place 1 spray into both nostrils daily as needed for allergies or rhinitis.    [name] gabapentin (NEURONTIN) 300 MG capsule Take 1  capsule (300 mg total) by mouth at bedtime as needed (nerve pain or twitching). 180 capsule 3  . glucose blood (ONETOUCH VERIO) test strip Use to check blood sugar daily. DX E11.9 100 each 3  . Lancets (ONETOUCH ULTRASOFT) lancets Use to check blood sugar daily. DX E11.9 100 each 3  . LORazepam (ATIVAN) 1 MG tablet take 1 tablet by mouth twice a day  as needed for anxiety  60 tablet 5  . Magnesium 400 MG TABS Take 1 tablet by mouth daily. 100 tablet 3  . metFORMIN (GLUCOPHAGE) 500 MG tablet Take 1 tablet (500 mg total) by mouth 2 (two) times daily with a meal. 180 tablet 3  . phentermine (ADIPEX-P) 37.5 MG tablet TAKE ONE TABLET BY MOUTH ONE TIME DAILY before breakfast 30 tablet 1  . potassium chloride (KLOR-CON) 8 MEQ tablet Take 1 tablet (8 mEq total) by mouth 2 (two) times daily. 180 tablet 3  . pregabalin (LYRICA) 75 MG capsule Take 1 capsule (75 mg total) by mouth 2 (two) times daily. 60 capsule 3  . Probiotic Product (PROBIOTIC PO) Take by mouth 2 (two) times daily.     No current facility-administered medications on file prior to visit.   Allergies  Allergen Reactions  . Celecoxib     swell  . Enalapril     cough  . Losartan     falls  . Lovastatin     REACTION: aches  . Naproxen   . Pravachol [Pravastatin Sodium]     Nausea   . Zetia [Ezetimibe]     Myalgias in LEs    Recent Results (from the past 2160 hour(s))  COMPLETE METABOLIC PANEL WITH GFR     Status: Abnormal   Collection Time: 03/06/19  2:58 PM  Result Value Ref Range   Glucose, Bld 117 (H) 65 - 99 mg/dL    Comment: .            Fasting reference interval . For someone without known diabetes, a glucose value between 100 and 125 mg/dL is consistent with prediabetes and should be confirmed with a follow-up test. .    BUN 22 7 - 25 mg/dL   Creat 1.01 (H) 0.60 - 0.93 mg/dL    Comment: For patients >79 years of age, the reference limit for Creatinine is approximately 13% higher for people identified as African-American. .    GFR, Est Non African American 55 (L) > OR = 60 mL/min/1.12m   GFR, Est African American 64 > OR = 60 mL/min/1.762m  BUN/Creatinine Ratio 22 6 - 22 (calc)   Sodium 140 135 - 146 mmol/L   Potassium 3.9 3.5 - 5.3 mmol/L   Chloride 103 98 - 110 mmol/L   CO2 25 20 - 32 mmol/L   Calcium 9.8 8.6 - 10.4 mg/dL   Total Protein 6.9  6.1 - 8.1 g/dL   Albumin 4.5 3.6 - 5.1 g/dL   Globulin 2.4 1.9 - 3.7 g/dL (calc)   AG Ratio 1.9 1.0 - 2.5 (calc)   Total Bilirubin 0.5 0.2 - 1.2 mg/dL   Alkaline phosphatase (APISO) 61 37 - 153 U/L   AST 16 10 - 35 U/L   ALT 11 6 - 29 U/L  Magnesium     Status: None   Collection Time: 03/06/19  2:58 PM  Result Value Ref Range   Magnesium 2.3 1.5 - 2.5 mg/dL  CK     Status: None   Collection Time: 03/06/19  2:58 PM  Result Value Ref Range   Total CK 75 7 - 177 U/L  Sedimentation rate     Status: None   Collection Time: 03/06/19  2:58 PM  Result Value Ref Range   Sed Rate 23 0 - 30 mm/hr  PTH, Intact and Calcium     Status: None   Collection Time: 03/06/19  2:58 PM  Result Value Ref Range   PTH 26 14 - 64 pg/mL    Comment: . Interpretive Guide    Intact PTH           Calcium ------------------    ----------           ------- Normal Parathyroid    Normal               Normal Hypoparathyroidism    Low or Low Normal    Low Hyperparathyroidism    Primary            Normal or High       High    Secondary          High                 Normal or Low    Tertiary           High                 High Non-Parathyroid    Hypercalcemia      Low or Low Normal    High .    Calcium 9.7 8.6 - 10.4 mg/dL  Hepatic function panel     Status: None   Collection Time: 04/21/19  8:27 AM  Result Value Ref Range   Total Bilirubin 0.4 0.2 - 1.2 mg/dL   Bilirubin, Direct 0.1 0.0 - 0.3 mg/dL   Alkaline Phosphatase 57 39 - 117 U/L   AST 17 0 - 37 U/L   ALT 14 0 - 35 U/L   Total Protein 6.9 6.0 - 8.3 g/dL   Albumin 4.5 3.5 - 5.2 g/dL  Basic metabolic panel     Status: Abnormal   Collection Time: 04/21/19  8:27 AM  Result Value Ref Range   Sodium 139 135 - 145 mEq/L   Potassium 4.1 3.5 - 5.1 mEq/L   Chloride 103 96 - 112 mEq/L   CO2 28 19 - 32 mEq/L   Glucose, Bld 91 70 - 99 mg/dL   BUN 16 6 - 23 mg/dL   Creatinine, Ser 0.95 0.40 - 1.20 mg/dL   GFR 57.53 (L) >60.00 mL/min   Calcium 9.5 8.4 -  10.5 mg/dL  POCT HgB A1C     Status: Normal   Collection Time: 04/24/19 11:33 AM  Result Value Ref Range   Hemoglobin A1C 5.2 4.0 - 5.6 %   HbA1c POC (<> result, manual entry)     HbA1c, POC (prediabetic range)     HbA1c, POC (controlled diabetic range)      Objective: General: Patient is awake, alert, and oriented x 3 and in no acute distress.  Integument: Skin is warm, dry and supple bilateral. Nails are tender, long, thickened and dystrophic with subungual debris, consistent with onychomycosis, 1-5 bilateral with bilateral hallux most involved improving in nature. No signs of infection. No open lesions or preulcerative lesions present bilateral. Remaining integument unremarkable.  Vasculature:  Dorsalis Pedis pulse 1/4 bilateral. Posterior Tibial pulse  1/4 bilateral.  Capillary fill time <3 sec 1-5 bilateral. Positive hair growth to the level of the  digits. Temperature gradient within normal limits.  Minimal varicosities present bilateral. No edema present bilateral.   Neurology: The patient has intact sensation measured with a 5.07/10g Semmes Weinstein Monofilament at all pedal sites bilateral. Vibratory sensation diminished bilateral with tuning fork. No Babinski sign present bilateral.  Subjective cramping occasionally in both feet and lower extremities.  Musculoskeletal: Asymptomatic hammertoe pedal deformities noted bilateral. Muscular strength 5/5 in all lower extremity muscular groups bilateral without pain on range of motion . No tenderness with calf compression bilateral.  Assessment and Plan: Problem List Items Addressed This Visit    None    Visit Diagnoses    Pain due to onychomycosis of toenails of both feet    -  Primary   Diabetic polyneuropathy associated with type 2 diabetes mellitus ([place])         -Examined patient. -Re-discussed  diabetic foot care and importance of daily inspection like before -Mechanically debrided all nails 1-5 bilateral using sterile nail  nipper and filed with dremel without incident  -Continue with penlac or Tea tree oil like previous to nails to help with nail changes  -Answered all patient questions -Patient to return  in 3 months for at risk foot care  -Patient advised to call the office if any problems or questions arise in the meantime.  Landis Martins, DPM

## 2019-05-10 NOTE — Telephone Encounter (Signed)
Ok to take [employer]

## 2019-05-10 NOTE — Telephone Encounter (Signed)
yes

## 2019-05-10 NOTE — Telephone Encounter (Signed)
Pt.notified

## 2019-05-12 ENCOUNTER — Ambulatory Visit: Payer: Medicare HMO | Admitting: Sports Medicine

## 2019-05-14 DIAGNOSIS — J019 Acute sinusitis, unspecified: Secondary | ICD-10-CM | POA: Diagnosis not present

## 2019-05-15 ENCOUNTER — Telehealth: Payer: Self-pay

## 2019-05-15 MED ORDER — ONETOUCH VERIO VI STRP: ORAL_STRIP | 3 refills | Status: DC

## 2019-05-15 NOTE — Telephone Encounter (Signed)
RX sent

## 2019-05-15 NOTE — Telephone Encounter (Signed)
1.Medication Requested:glucose blood (ONETOUCH VERIO) test strip  2. Pharmacy (Name, Street, [place]): [facility] [address] [place] -[phone]   3. On Med List: Yes   4. Last Visit with PCP: 4.12.2021   5. Next visit date with PCP:7.13.2021   Agent: Please be advised that RX refills may take up to 3 business days. We ask that you follow-up with your pharmacy.

## 2019-05-17 ENCOUNTER — Telehealth: Payer: Self-pay | Admitting: Internal Medicine

## 2019-05-17 DIAGNOSIS — R69 Illness, unspecified: Secondary | ICD-10-CM | POA: Diagnosis not present

## 2019-05-17 NOTE — Telephone Encounter (Signed)
    Patient states pharmacy unable to refill until 05/26. Requesting new order be written to test blood sugar more frequently.

## 2019-05-17 NOTE — Telephone Encounter (Signed)
New message:   Pt states the insurance company approved her for the strips as long as she uses them once a day and in the morning. Please advise.

## 2019-05-17 NOTE — Telephone Encounter (Signed)
Pt informed that she is only to check her once a day as that is all insurance will approve and to contact pharmacy to see if her insurance will allow a one time okay to get early since she is new at checking her blood sugar

## 2019-05-18 NOTE — Telephone Encounter (Signed)
noted 

## 2019-05-23 ENCOUNTER — Telehealth: Payer: Self-pay

## 2019-05-23 DIAGNOSIS — E119 Type 2 diabetes mellitus without complications: Secondary | ICD-10-CM

## 2019-05-23 NOTE — Telephone Encounter (Signed)
Okay to place referral

## 2019-05-23 NOTE — Telephone Encounter (Signed)
New message    The patient calling asking for a   Referral : [facility] / DM in [place]   Fax # [phone] Office: [phone]  Reason :  H/o DM

## 2019-05-24 ENCOUNTER — Institutional Professional Consult (permissible substitution): Payer: Medicare HMO | Admitting: Diagnostic Neuroimaging

## 2019-05-24 NOTE — Telephone Encounter (Signed)
Referral placed.

## 2019-05-24 NOTE — Telephone Encounter (Signed)
Ok Thx 

## 2019-05-30 ENCOUNTER — Other Ambulatory Visit: Payer: Self-pay | Admitting: Family Medicine

## 2019-05-30 ENCOUNTER — Telehealth: Payer: Self-pay | Admitting: Internal Medicine

## 2019-05-30 ENCOUNTER — Other Ambulatory Visit: Payer: Self-pay | Admitting: Internal Medicine

## 2019-05-30 DIAGNOSIS — J4520 Mild intermittent asthma, uncomplicated: Secondary | ICD-10-CM

## 2019-05-30 NOTE — Telephone Encounter (Signed)
    Patient requesting order for covid antibody test

## 2019-05-30 NOTE — Telephone Encounter (Signed)
Please advise 

## 2019-05-31 ENCOUNTER — Telehealth: Payer: Self-pay | Admitting: Family Medicine

## 2019-05-31 ENCOUNTER — Ambulatory Visit: Payer: Medicare HMO | Admitting: Sports Medicine

## 2019-05-31 NOTE — Telephone Encounter (Signed)
Ok thx.

## 2019-05-31 NOTE — Telephone Encounter (Signed)
Pt.notified

## 2019-06-02 DIAGNOSIS — G4733 Obstructive sleep apnea (adult) (pediatric): Secondary | ICD-10-CM | POA: Diagnosis not present

## 2019-06-02 MED ORDER — BACLOFEN 10 MG PO TABS: ORAL_TABLET | ORAL | 1 refills | Status: DC

## 2019-06-02 NOTE — Telephone Encounter (Signed)
90 day supply of this medicine sent to pharmacy with 1 refill.

## 2019-06-02 NOTE — Addendum Note (Signed)
Addended by: Gregor Hams on: 06/02/2019 12:21 PM   Modules accepted: Orders

## 2019-06-02 NOTE — Telephone Encounter (Signed)
Patient called requesting a refill on this medication. 

## 2019-06-06 ENCOUNTER — Encounter: Payer: Self-pay | Admitting: Diagnostic Neuroimaging

## 2019-06-06 ENCOUNTER — Other Ambulatory Visit: Payer: Self-pay

## 2019-06-06 ENCOUNTER — Ambulatory Visit (INDEPENDENT_AMBULATORY_CARE_PROVIDER_SITE_OTHER): Payer: Medicare HMO | Admitting: Diagnostic Neuroimaging

## 2019-06-06 VITALS — BP 139/83 | HR 83 | Ht 63.5 in | Wt 141.8 lb

## 2019-06-06 DIAGNOSIS — R6889 Other general symptoms and signs: Secondary | ICD-10-CM | POA: Diagnosis not present

## 2019-06-06 DIAGNOSIS — G629 Polyneuropathy, unspecified: Secondary | ICD-10-CM | POA: Diagnosis not present

## 2019-06-06 DIAGNOSIS — R519 Headache, unspecified: Secondary | ICD-10-CM | POA: Diagnosis not present

## 2019-06-06 DIAGNOSIS — R252 Cramp and spasm: Secondary | ICD-10-CM | POA: Diagnosis not present

## 2019-06-06 DIAGNOSIS — D509 Iron deficiency anemia, unspecified: Secondary | ICD-10-CM | POA: Diagnosis not present

## 2019-06-06 DIAGNOSIS — G603 Idiopathic progressive neuropathy: Secondary | ICD-10-CM | POA: Diagnosis not present

## 2019-06-06 DIAGNOSIS — M2550 Pain in unspecified joint: Secondary | ICD-10-CM | POA: Diagnosis not present

## 2019-06-06 NOTE — Patient Instructions (Signed)
  MUSCLE CRAMPS / NEUROPATHY - check neuropathy labs - check MRI lumbar spine - consider duloxetine

## 2019-06-06 NOTE — Progress Notes (Signed)
[facility]  PATIENT: Tiffany Gibbs DOB: 02-28-1945  REFERRING CLINICIAN: Gregor Hams, MD HISTORY FROM: patient  REASON FOR VISIT: new consult    HISTORICAL  CHIEF COMPLAINT:  Chief Complaint  Patient presents with  . Neuropathy    rm 7 New Pt "stopped gabapentin & Lyrica due to dizziness; severe leg cramps that cause soreness, neuropathy in feet/ankles; my hands get stiff, cramp"    HISTORY OF PRESENT ILLNESS:   UPDATE (06/06/19, [facility]P): Since last visit, doing well and HA are resolved (now on OSA).  Now having leg cramps, numbness in legs since 2017. Symptoms are intermittent (night time). Severity is moderate. No alleviating or aggravating factors. Could not tolerate gabapentin or lyrica.   UPDATE 06/12/15: Since last visit, headaches are improved. Dizzy spells are faint and rare (~1 per week, 1 second). Meds are being adjusted. Now back on CPAP x few weeks.   PRIOR HPI (04/30/15): 74 year old right-handed female here for evaluation of headaches and dizziness. Patient has history of migraine headaches in her 44s with severe global headaches with photophobia. No nausea vomiting. Headaches seem to resolve by the time she was 74 years old. Since February 2017 patient has had intermittent lightheaded sensations, room spinning sensations, dizzy spells lasting a few seconds at a time. Symptoms would occur a few times per week. Positional changes would seem to trigger headaches such as turning, bending, sitting up or standing up. No nausea or vomiting. No passing out, slurred speech, trouble talking, double vision. Patient would have some balance difficulties and feeling like she could fall down and have to hold onto something. In April 2017 patient was at [employer] center and had a severe episode that scared her. In addition since November 2016 patient has been having increasing increasing right-sided headaches, right-sided neck pain, right occipital pain with  pounding sensation lasting for 2 hours. No nausea or vomiting. No photophobia or phonophobia. Patient having at least 2 headaches per month.   REVIEW OF SYSTEMS: Full 14 system review of systems performed and negative with exception of: as per HPI.  ALLERGIES: Allergies  Allergen Reactions  . Celecoxib     swell  . Enalapril     cough  . Gabapentin Other (See Comments)    dizziness  . Losartan     falls  . Lovastatin     REACTION: aches  . Lyrica [Pregabalin] Other (See Comments)    dizziness  . Naproxen   . Pravachol [Pravastatin Sodium]     Nausea   . Zetia [Ezetimibe]     Myalgias in LEs    HOME MEDICATIONS: Outpatient Medications Prior to Visit  Medication Sig Dispense Refill  . baclofen (LIORESAL) 10 MG tablet TAKE one - TWO TABLETS BY MOUTH DAILY AT BEDTIME AS NEEDED 180 tablet 1  . Biotin 10000 MCG TABS Take by mouth.    . Blood Glucose Monitoring Suppl (ONETOUCH VERIO) w/Device KIT Use to check blood sugar daily. DX E11.9 1 kit 0  . ciclopirox (PENLAC) 8 % solution APPLY OVER NAIL AND SURROUNDING SKIN DAILY AT BEDTIME. APPLY DAILY OVER PREVIOUS COAT. AFTER 7 DAYS MAY REMOVE WITH ALCOHOL AND CONTINUE CYCLE 6.6 mL 1  . diphenhydrAMINE (BENADRYL ALLERGY) 25 mg capsule Take 25 mg by mouth daily as needed (allergies).     [name] escitalopram (LEXAPRO) 5 MG tablet Take 1 tablet (5 mg total) by mouth daily. 90 tablet 3  . fluticasone (FLONASE) 50 MCG/ACT nasal spray Place 1 spray into both  nostrils daily as needed for allergies or rhinitis.    [name] glucose blood (ONETOUCH VERIO) test strip Use to check blood sugar daily. DX E11.9 100 each 3  . Lancets (ONETOUCH ULTRASOFT) lancets Use to check blood sugar daily. DX E11.9 100 each 3  . LORazepam (ATIVAN) 1 MG tablet take 1 tablet by mouth twice a day as needed for anxiety  60 tablet 5  . Magnesium 400 MG TABS Take 1 tablet by mouth daily. 100 tablet 3  . magnesium oxide (MAG-OX) 400 MG tablet Take 400 mg by mouth daily.    .  metFORMIN (GLUCOPHAGE) 500 MG tablet Take 1 tablet (500 mg total) by mouth 2 (two) times daily with a meal. 180 tablet 3  . phentermine (ADIPEX-P) 37.5 MG tablet TAKE ONE TABLET BY MOUTH ONE TIME DAILY before breakfast 30 tablet 1  . potassium chloride (KLOR-CON) 8 MEQ tablet TAKE ONE TABLET BY MOUTH TWICE DAILY  180 tablet 3  . Probiotic Product (PROBIOTIC PO) Take by mouth 2 (two) times daily.    [name] UNABLE TO FIND Med Name: Hyland's leg cramps    . gabapentin (NEURONTIN) 300 MG capsule Take 1 capsule (300 mg total) by mouth at bedtime as needed (nerve pain or twitching). (Patient not taking: Reported on 06/06/2019) 180 capsule 3  . pregabalin (LYRICA) 75 MG capsule Take 1 capsule (75 mg total) by mouth 2 (two) times daily. (Patient not taking: Reported on 06/06/2019) 60 capsule 3   No facility-administered medications prior to visit.    PAST MEDICAL HISTORY: Past Medical History:  Diagnosis Date  . Anxiety   . Cataract   . Depression   . DM type 2 (diabetes mellitus, type 2) ([place])   . Fibromyalgia   . H/O dizziness   . Hives    from tomatoes  . HTN (hypertension)   . Hyperlipidemia   . Kidney disease   . Low back pain   . OSA (obstructive sleep apnea)    uses c-pap  . Osteoarthritis   . Osteoporosis   . SOB (shortness of breath) on exertion     PAST SURGICAL HISTORY: Past Surgical History:  Procedure Laterality Date  . cervical cryotherapy     cervix  . CYSTOCELE REPAIR N/A 11/01/2018   Procedure: CYSTOSCOPY ANTERIOR REPAIR (CYSTOCELE);  Surgeon: Bjorn Loser, MD;  Location: [facility];  Service: Urology;  Laterality: N/A;  . EYE SURGERY     Cataracts/Bil  . LASIK Bilateral   . TOTAL ABDOMINAL HYSTERECTOMY    . TOTAL HIP ARTHROPLASTY  09/2008   right- [place]/  . TOTAL HIP ARTHROPLASTY Left 11/28/2012   Procedure: TOTAL HIP ARTHROPLASTY;  Surgeon: Kerin Salen, MD;  Location: [facility];  Service: Orthopedics;  Laterality: Left;    FAMILY HISTORY: Family History  Problem  Relation Age of Onset  . Hypertension Mother   . Diabetes Mother   . Other Mother        DVT  . Heart disease Father   . Multiple sclerosis Father   . Heart attack Father   . Kidney disease Brother 33       ESRD    SOCIAL HISTORY: Social History   Socioeconomic History  . Marital status: Married    Spouse name: Sam  . Number of children: 1  . Years of education: 53   . Highest education level: Not on file  Occupational History  . Occupation: N/A  Tobacco Use  . Smoking status: Former Smoker  Packs/day: 2.00    Years: 25.00    Pack years: 50.00    Types: Cigarettes    Quit date: 01/13/1983    Years since quitting: 36.4  . Smokeless tobacco: Never Used  Substance and Sexual Activity  . Alcohol use: No    Alcohol/week: 0.0 standard drinks  . Drug use: No  . Sexual activity: Not Currently    Birth control/protection: Post-menopausal, Surgical  Other Topics Concern  . Not on file  Social History Narrative   Was divorced - newly re-married as of 2009, Sam   1 son      Caffeine use- coffee 1 cup daily    Social Determinants of Health   Financial Resource Strain:   . Difficulty of Paying Living Expenses:   Food Insecurity:   . Worried About Charity fundraiser in the Last Year:   . Arboriculturist in the Last Year:   Transportation Needs:   . Film/video editor (Medical):   [name] Lack of Transportation (Non-Medical):   Physical Activity:   . Days of Exercise per Week:   . Minutes of Exercise per Session:   Stress:   . Feeling of Stress :   Social Connections:   . Frequency of Communication with Friends and Family:   . Frequency of Social Gatherings with Friends and Family:   . Attends Religious Services:   . Active Member of Clubs or Organizations:   . Attends Archivist Meetings:   [name] Marital Status:   Intimate Partner Violence:   . Fear of Current or Ex-Partner:   . Emotionally Abused:   [name] Physically Abused:   . Sexually Abused:       PHYSICAL EXAM  GENERAL EXAM/CONSTITUTIONAL: Vitals:  Vitals:   06/06/19 1246  BP: 139/83  Pulse: 83  Weight: 141 lb 12.8 oz (64.3 kg)  Height: 5' 3.5" (1.613 m)     Body mass index is 24.72 kg/m. Wt Readings from Last 3 Encounters:  06/06/19 141 lb 12.8 oz (64.3 kg)  04/25/19 143 lb 3.2 oz (65 kg)  04/24/19 141 lb (64 kg)     Patient is in no distress; well developed, nourished and groomed; neck is supple  CARDIOVASCULAR:  Examination of carotid arteries is normal; no carotid bruits  Regular rate and rhythm, no murmurs  Examination of peripheral vascular system by observation and palpation is normal  EYES:  Ophthalmoscopic exam of optic discs and posterior segments is normal; no papilledema or hemorrhages  No exam data present  MUSCULOSKELETAL:  Gait, strength, tone, movements noted in Neurologic exam below  NEUROLOGIC: MENTAL STATUS:  MMSE - [place] Exam 11/19/2014  Not completed: (No Data)    awake, alert, oriented to person, place and time  recent and remote memory intact  normal attention and concentration  language fluent, comprehension intact, naming intact  fund of knowledge appropriate  CRANIAL NERVE:   2nd - no papilledema on fundoscopic exam  2nd, 3rd, 4th, 6th - pupils equal and reactive to light, visual fields full to confrontation, extraocular muscles intact, no nystagmus  5th - facial sensation symmetric  7th - facial strength symmetric  8th - hearing intact  9th - palate elevates symmetrically, uvula midline  11th - shoulder shrug symmetric  12th - tongue protrusion midline  MOTOR:   normal bulk and tone, full strength in the BUE, BLE  SENSORY:   normal and symmetric to light touch, pinprick, temperature, vibration; EXCEPT DECR VIB AT TOES  COORDINATION:   finger-nose-finger, fine finger movements normal  REFLEXES:   deep tendon reflexes TRACE and symmetric  GAIT/STATION:   narrow based  gait     DIAGNOSTIC DATA (LABS, IMAGING, TESTING) - I reviewed patient records, labs, notes, testing and imaging myself where available.  Lab Results  Component Value Date   WBC 6.7 01/19/2019   HGB 13.7 01/19/2019   HCT 41.0 01/19/2019   MCV 89.3 01/19/2019   PLT 269.0 01/19/2019      Component Value Date/Time   NA 139 04/21/2019 0827   K 4.1 04/21/2019 0827   CL 103 04/21/2019 0827   CO2 28 04/21/2019 0827   GLUCOSE 91 04/21/2019 0827   BUN 16 04/21/2019 0827   CREATININE 0.95 04/21/2019 0827   CREATININE 1.01 (H) 03/06/2019 1458   CALCIUM 9.5 04/21/2019 0827   PROT 6.9 04/21/2019 0827   ALBUMIN 4.5 04/21/2019 0827   AST 17 04/21/2019 0827   ALT 14 04/21/2019 0827   ALKPHOS 57 04/21/2019 0827   BILITOT 0.4 04/21/2019 0827   GFRNONAA 55 (L) 03/06/2019 1458   GFRAA 64 03/06/2019 1458   Lab Results  Component Value Date   CHOL 214 (H) 01/19/2019   HDL 66.60 01/19/2019   LDLCALC 130 (H) 01/19/2019   LDLDIRECT 148.2 02/03/2013   TRIG 87.0 01/19/2019   CHOLHDL 3 01/19/2019   Lab Results  Component Value Date   HGBA1C 5.2 04/24/2019   Lab Results  Component Value Date   VITAMINB12 405 01/19/2019   Lab Results  Component Value Date   TSH 0.79 01/19/2019    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  05/13/15 MRI brain  - normal  05/13/15 MRA head / neck  - normal   ASSESSMENT AND PLAN  74 y.o. year old female here with:  Dx:  1. Neuropathy   2. Leg cramps     PLAN:  MUSCLE CRAMPS / NEUROPATHY - check neuropathy labs - check MRI lumbar spine - consider duloxetine (instead of lexapro)  Orders Placed This Encounter  Procedures  . SPEP with IFE  . ANA w/Reflex  . SSA, SSB  . ESR  . CRP  . HIV  . RPR  . Vitamin B1  . Vitamin B6  . ANCA  . Anti-HU, Anti-RI, Anti-YO IFA  . Hepatitis C antibody  . Hepatitis B core antibody, total  . Hepatitis B surface antigen  . Hepatitis B surface antibody, qualitative  . Ferritin  .  Iron and TIBC  . CK  . Aldolase   Return pending testing, for pending if symptoms worsen or fail to improve.    Penni Bombard, MD 1/65/7903, 8:33 PM Certified in Neurology, Neurophysiology and Neuroimaging  [facility] [address] [place], [place] [address] [phone]

## 2019-06-07 ENCOUNTER — Telehealth: Payer: Self-pay | Admitting: Diagnostic Neuroimaging

## 2019-06-07 NOTE — Telephone Encounter (Signed)
[employer]na medicare/champva order sent to [facility]. They will obtain the auth and reach out to the patient to schedule.

## 2019-06-10 LAB — PAN-ANCA
ANCA Proteinase 3: <3.5 U/mL (ref 0.0–3.5)
Atypical pANCA: <1:20 {titer}
C-ANCA: <1:20 {titer}
Myeloperoxidase Ab: <9 U/mL (ref 0.0–9.0)
P-ANCA: <1:20 {titer}

## 2019-06-10 LAB — MULTIPLE MYELOMA PANEL, SERUM
Albumin SerPl Elph-Mcnc: 4.4 g/dL (ref 2.9–4.4)
Albumin/Glob SerPl: 1.5 (ref 0.7–1.7)
Alpha 1: 0.2 g/dL (ref 0.0–0.4)
Alpha2 Glob SerPl Elph-Mcnc: 0.8 g/dL (ref 0.4–1.0)
B-Globulin SerPl Elph-Mcnc: 1.1 g/dL (ref 0.7–1.3)
Gamma Glob SerPl Elph-Mcnc: 1 g/dL (ref 0.4–1.8)
Globulin, Total: 3.1 g/dL (ref 2.2–3.9)
IgA/Immunoglobulin A, Serum: 204 mg/dL (ref 64–422)
IgG (Immunoglobin G), Serum: 865 mg/dL (ref 586–1602)
IgM (Immunoglobulin M), Srm: 203 mg/dL (ref 26–217)
Total Protein: 7.5 g/dL (ref 6.0–8.5)

## 2019-06-10 LAB — ANTI-HU, ANTI-RI, ANTI-YO IFA
Anti-Hu Ab: NEGATIVE
Anti-Ri Ab: NEGATIVE
Anti-Yo Ab: NEGATIVE

## 2019-06-10 LAB — VITAMIN B6: Vitamin B6: 8.1 ug/L (ref 2.0–32.8)

## 2019-06-10 LAB — IRON AND TIBC
Iron Saturation: 24 % (ref 15–55)
Iron: 85 ug/dL (ref 27–139)
Total Iron Binding Capacity: 361 ug/dL (ref 250–450)
UIBC: 276 ug/dL (ref 118–369)

## 2019-06-10 LAB — CK: Total CK: 107 U/L (ref 32–182)

## 2019-06-10 LAB — SJOGREN'S SYNDROME ANTIBODS(SSA + SSB)
ENA SSA (RO) Ab: <0.2 AI (ref 0.0–0.9)
ENA SSB (LA) Ab: <0.2 AI (ref 0.0–0.9)

## 2019-06-10 LAB — VITAMIN B1: Thiamine: 127.4 nmol/L (ref 66.5–200.0)

## 2019-06-10 LAB — HIV ANTIBODY (ROUTINE TESTING W REFLEX): HIV Screen 4th Generation wRfx: NONREACTIVE

## 2019-06-10 LAB — HEPATITIS B SURFACE ANTIGEN: Hepatitis B Surface Ag: NEGATIVE

## 2019-06-10 LAB — SEDIMENTATION RATE: Sed Rate: 13 mm/hr (ref 0–40)

## 2019-06-10 LAB — ANA W/REFLEX: ANA Titer 1: NEGATIVE

## 2019-06-10 LAB — HEPATITIS B SURFACE ANTIBODY,QUALITATIVE: Hep B Surface Ab, Qual: NONREACTIVE

## 2019-06-10 LAB — HEPATITIS B CORE ANTIBODY, TOTAL: Hep B Core Total Ab: NEGATIVE

## 2019-06-10 LAB — C-REACTIVE PROTEIN: CRP: <1 mg/L (ref 0–10)

## 2019-06-10 LAB — FERRITIN: Ferritin: 219 ng/mL — ABNORMAL HIGH (ref 15–150)

## 2019-06-10 LAB — ALDOLASE: Aldolase: 5.6 U/L (ref 3.3–10.3)

## 2019-06-10 LAB — RPR: RPR Ser Ql: NONREACTIVE

## 2019-06-10 LAB — HEPATITIS C ANTIBODY: Hep C Virus Ab: <0.1 s/co ratio (ref 0.0–0.9)

## 2019-06-13 ENCOUNTER — Telehealth: Payer: Self-pay | Admitting: *Deleted

## 2019-06-13 NOTE — Telephone Encounter (Signed)
Labs ok. VRP

## 2019-06-13 NOTE — Telephone Encounter (Signed)
LVM informing patient her labs are okay, advised she call [facility] to schedule her MRI. Gave her their # and left office # for questions.

## 2019-06-15 ENCOUNTER — Telehealth: Payer: Self-pay | Admitting: Internal Medicine

## 2019-06-15 ENCOUNTER — Telehealth: Payer: Self-pay | Admitting: Diagnostic Neuroimaging

## 2019-06-15 DIAGNOSIS — R69 Illness, unspecified: Secondary | ICD-10-CM | POA: Diagnosis not present

## 2019-06-15 MED ORDER — ONETOUCH DELICA LANCETS 33G MISC: 1.0000 | Freq: Two times a day (BID) | 2 refills | Status: DC

## 2019-06-15 NOTE — Telephone Encounter (Signed)
Rx sent. See meds.  

## 2019-06-15 NOTE — Telephone Encounter (Signed)
Pt called wanting to know if a copy of her last lab work can be mailed out to her for her records. Please advise.

## 2019-06-15 NOTE — Telephone Encounter (Signed)
Patient calling to request One Touch Delica extra fine lancets, 200 count Patient test twice a day Send to [facility] on [address]

## 2019-06-20 ENCOUNTER — Telehealth: Payer: Self-pay | Admitting: Internal Medicine

## 2019-06-20 NOTE — Telephone Encounter (Signed)
New message:   Pt is calling to see if she should start back taking Vitamin C and if so how much. Please advise.

## 2019-06-21 ENCOUNTER — Ambulatory Visit (INDEPENDENT_AMBULATORY_CARE_PROVIDER_SITE_OTHER): Payer: Medicare HMO | Admitting: Adult Health

## 2019-06-21 ENCOUNTER — Encounter: Payer: Self-pay | Admitting: Adult Health

## 2019-06-21 VITALS — BP 133/78 | HR 68 | Ht 63.5 in | Wt 136.0 lb

## 2019-06-21 DIAGNOSIS — Z9989 Dependence on other enabling machines and devices: Secondary | ICD-10-CM

## 2019-06-21 DIAGNOSIS — G4733 Obstructive sleep apnea (adult) (pediatric): Secondary | ICD-10-CM

## 2019-06-21 DIAGNOSIS — E119 Type 2 diabetes mellitus without complications: Secondary | ICD-10-CM | POA: Diagnosis not present

## 2019-06-21 NOTE — Patient Instructions (Signed)
Continue using CPAP nightly and greater than 4 hours each night °If your symptoms worsen or you develop new symptoms please let us know.  ° °

## 2019-06-21 NOTE — Progress Notes (Addendum)
PATIENT: Tiffany Gibbs DOB: 1945-03-09  REASON FOR VISIT: follow up HISTORY FROM: patient  HISTORY OF PRESENT ILLNESS: Today 06/21/19:  Tiffany Gibbs is a 74 year old female with a history of obstructive sleep apnea on CPAP.  Download indicates that she use her machine 24 out of 30 days for compliance of 80%.  She use her machine greater than 4 hours each night.  On average she uses her machine 7 hours and 2 minutes.  Residual AHI is 0.3 on 10 cm of water with EPR 3.  Leak in the 95th percentile is 39.3 L/min.  She reports that the CPAP is working well.  She returns today for an evaluation.  HISTORY (copied from Dr. Guadelupe Sabin note)  I reviewed her CPAP compliance data from 05/09/2018 through 06/07/2018 which is a total of 30 days, during which time she used her machine every night with percent used days greater than 4 hours at 100%, indicating superb compliance with an average usage of 7 hours and 10 minutes which is very good, residual AHI at goal at 0.3/h, leak high with a 95th percentile at 50.1 L/min and leak has been noted to be consistently high throughout, pressure at 11 cm with EPR of 3.  REVIEW OF SYSTEMS: Out of a complete 14 system review of symptoms, the patient complains only of the following symptoms, and all other reviewed systems are negative.  See HPI  ALLERGIES: Allergies  Allergen Reactions  . Celecoxib     swell  . Enalapril     cough  . Gabapentin Other (See Comments)    dizziness  . Losartan     falls  . Lovastatin     REACTION: aches  . Lyrica [Pregabalin] Other (See Comments)    dizziness  . Naproxen   . Pravachol [Pravastatin Sodium]     Nausea   . Zetia [Ezetimibe]     Myalgias in LEs    HOME MEDICATIONS: Outpatient Medications Prior to Visit  Medication Sig Dispense Refill  . baclofen (LIORESAL) 10 MG tablet TAKE one - TWO TABLETS BY MOUTH DAILY AT BEDTIME AS NEEDED 180 tablet 1  . Biotin 10000 MCG TABS Take by mouth.    . Blood Glucose  Monitoring Suppl (ONETOUCH VERIO) w/Device KIT Use to check blood sugar daily. DX E11.9 1 kit 0  . ciclopirox (PENLAC) 8 % solution APPLY OVER NAIL AND SURROUNDING SKIN DAILY AT BEDTIME. APPLY DAILY OVER PREVIOUS COAT. AFTER 7 DAYS MAY REMOVE WITH ALCOHOL AND CONTINUE CYCLE 6.6 mL 1  . diphenhydrAMINE (BENADRYL ALLERGY) 25 mg capsule Take 25 mg by mouth daily as needed (allergies).     [name] escitalopram (LEXAPRO) 5 MG tablet Take 1 tablet (5 mg total) by mouth daily. 90 tablet 3  . fluticasone (FLONASE) 50 MCG/ACT nasal spray Place 1 spray into both nostrils daily as needed for allergies or rhinitis.    [name] gabapentin (NEURONTIN) 300 MG capsule Take 1 capsule (300 mg total) by mouth at bedtime as needed (nerve pain or twitching). 180 capsule 3  . glucose blood (ONETOUCH VERIO) test strip Use to check blood sugar daily. DX E11.9 100 each 3  . LORazepam (ATIVAN) 1 MG tablet take 1 tablet by mouth twice a day as needed for anxiety  60 tablet 5  . Magnesium 400 MG TABS Take 1 tablet by mouth daily. 100 tablet 3  . magnesium oxide (MAG-OX) 400 MG tablet Take 400 mg by mouth daily.    . metFORMIN (GLUCOPHAGE) 500 MG  tablet Take 1 tablet (500 mg total) by mouth 2 (two) times daily with a meal. 180 tablet 3  . OneTouch Delica Lancets 53I MISC 1 each by Does not apply route in the morning and at bedtime. DX: E11.9 200 each 2  . phentermine (ADIPEX-P) 37.5 MG tablet TAKE ONE TABLET BY MOUTH ONE TIME DAILY before breakfast 30 tablet 1  . potassium chloride (KLOR-CON) 8 MEQ tablet TAKE ONE TABLET BY MOUTH TWICE DAILY  180 tablet 3  . pregabalin (LYRICA) 75 MG capsule Take 1 capsule (75 mg total) by mouth 2 (two) times daily. 60 capsule 3  . Probiotic Product (PROBIOTIC PO) Take by mouth 2 (two) times daily.    [name] UNABLE TO FIND Med Name: Hyland's leg cramps     No facility-administered medications prior to visit.    PAST MEDICAL HISTORY: Past Medical History:  Diagnosis Date  . Anxiety   . Cataract   .  Depression   . DM type 2 (diabetes mellitus, type 2) ([place])   . Fibromyalgia   . H/O dizziness   . Hives    from tomatoes  . HTN (hypertension)   . Hyperlipidemia   . Kidney disease   . Low back pain   . OSA (obstructive sleep apnea)    uses c-pap  . Osteoarthritis   . Osteoporosis   . SOB (shortness of breath) on exertion     PAST SURGICAL HISTORY: Past Surgical History:  Procedure Laterality Date  . cervical cryotherapy     cervix  . CYSTOCELE REPAIR N/A 11/01/2018   Procedure: CYSTOSCOPY ANTERIOR REPAIR (CYSTOCELE);  Surgeon: Bjorn Loser, MD;  Location: [facility];  Service: Urology;  Laterality: N/A;  . EYE SURGERY     Cataracts/Bil  . LASIK Bilateral   . TOTAL ABDOMINAL HYSTERECTOMY    . TOTAL HIP ARTHROPLASTY  09/2008   right- [place]/  . TOTAL HIP ARTHROPLASTY Left 11/28/2012   Procedure: TOTAL HIP ARTHROPLASTY;  Surgeon: Kerin Salen, MD;  Location: [facility];  Service: Orthopedics;  Laterality: Left;    FAMILY HISTORY: Family History  Problem Relation Age of Onset  . Hypertension Mother   . Diabetes Mother   . Other Mother        DVT  . Heart disease Father   . Multiple sclerosis Father   . Heart attack Father   . Kidney disease Brother 9       ESRD    SOCIAL HISTORY: Social History   Socioeconomic History  . Marital status: Married    Spouse name: Sam  . Number of children: 1  . Years of education: 66   . Highest education level: Not on file  Occupational History  . Occupation: N/A  Tobacco Use  . Smoking status: Former Smoker    Packs/day: 2.00    Years: 25.00    Pack years: 50.00    Types: Cigarettes    Quit date: 01/13/1983    Years since quitting: 36.4  . Smokeless tobacco: Never Used  Substance and Sexual Activity  . Alcohol use: No    Alcohol/week: 0.0 standard drinks  . Drug use: No  . Sexual activity: Not Currently    Birth control/protection: Post-menopausal, Surgical  Other Topics Concern  . Not on file  Social History  Narrative   Was divorced - newly re-married as of 2009, Sam   1 son      Caffeine use- coffee 1 cup daily    Social Determinants of Health  Financial Resource Strain:   . Difficulty of Paying Living Expenses:   Food Insecurity:   . Worried About Charity fundraiser in the Last Year:   . Arboriculturist in the Last Year:   Transportation Needs:   . Film/video editor (Medical):   [name] Lack of Transportation (Non-Medical):   Physical Activity:   . Days of Exercise per Week:   . Minutes of Exercise per Session:   Stress:   . Feeling of Stress :   Social Connections:   . Frequency of Communication with Friends and Family:   . Frequency of Social Gatherings with Friends and Family:   . Attends Religious Services:   . Active Member of Clubs or Organizations:   . Attends Archivist Meetings:   [name] Marital Status:   Intimate Partner Violence:   . Fear of Current or Ex-Partner:   . Emotionally Abused:   [name] Physically Abused:   . Sexually Abused:       PHYSICAL EXAM  Vitals:   06/21/19 1343  BP: 133/78  Pulse: 68  Weight: 136 lb (61.7 kg)  Height: 5' 3.5" (1.613 m)   Body mass index is 23.71 kg/m.  Generalized: Well developed, in no acute distress  Chest: Lungs clear to auscultation bilaterally  Neurological examination  Mentation: Alert oriented to time, place, history taking. Follows all commands speech and language fluent Cranial nerve II-XII: Extraocular movements were full, visual field were full on confrontational test Head turning and shoulder shrug  were normal and symmetric. Motor: The motor testing reveals 5 over 5 strength of all 4 extremities. Good symmetric motor tone is noted throughout.  Sensory: Sensory testing is intact to soft touch on all 4 extremities. No evidence of extinction is noted.  Gait and station: Gait is normal.    DIAGNOSTIC DATA (LABS, IMAGING, TESTING) - I reviewed patient records, labs, notes, testing and imaging myself  where available.  Lab Results  Component Value Date   WBC 6.7 01/19/2019   HGB 13.7 01/19/2019   HCT 41.0 01/19/2019   MCV 89.3 01/19/2019   PLT 269.0 01/19/2019      Component Value Date/Time   NA 139 04/21/2019 0827   K 4.1 04/21/2019 0827   CL 103 04/21/2019 0827   CO2 28 04/21/2019 0827   GLUCOSE 91 04/21/2019 0827   BUN 16 04/21/2019 0827   CREATININE 0.95 04/21/2019 0827   CREATININE 1.01 (H) 03/06/2019 1458   CALCIUM 9.5 04/21/2019 0827   PROT 7.5 06/06/2019 1350   ALBUMIN 4.5 04/21/2019 0827   AST 17 04/21/2019 0827   ALT 14 04/21/2019 0827   ALKPHOS 57 04/21/2019 0827   BILITOT 0.4 04/21/2019 0827   GFRNONAA 55 (L) 03/06/2019 1458   GFRAA 64 03/06/2019 1458   Lab Results  Component Value Date   CHOL 214 (H) 01/19/2019   HDL 66.60 01/19/2019   LDLCALC 130 (H) 01/19/2019   LDLDIRECT 148.2 02/03/2013   TRIG 87.0 01/19/2019   CHOLHDL 3 01/19/2019   Lab Results  Component Value Date   HGBA1C 5.2 04/24/2019   Lab Results  Component Value Date   VITAMINB12 405 01/19/2019   Lab Results  Component Value Date   TSH 0.79 01/19/2019      ASSESSMENT AND PLAN 74 y.o. year old female  has a past medical history of Anxiety, Cataract, Depression, DM type 2 (diabetes mellitus, type 2) ([place]), Fibromyalgia, H/O dizziness, Hives, HTN (hypertension), Hyperlipidemia, Kidney disease, Low back pain,  OSA (obstructive sleep apnea), Osteoarthritis, Osteoporosis, and SOB (shortness of breath) on exertion. here with:  1. OSA on CPAP  - CPAP compliance excellent - Good treatment of AHI  - Encourage patient to use CPAP nightly and > 4 hours each night - F/U in 1 year or sooner if needed   I spent 20 minutes of face-to-face and non-face-to-face time with patient.  This included previsit chart review, lab review, study review, order entry, electronic health record documentation, patient education.  Ward Givens, MSN, NP-C 06/21/2019, 1:53 PM [facility] [address] [place], [place] [address] [phone]  I reviewed the above note and documentation by the Nurse Practitioner and agree with the history, exam, assessment and plan as outlined above. I was available for consultation. Star Age, MD, PhD [facility])

## 2019-06-21 NOTE — Telephone Encounter (Signed)
I placed in mail. (cpap report).

## 2019-06-21 NOTE — Telephone Encounter (Signed)
Sandy - Can you send this Pt her sleep Apnea report through the mail. She was here but forgot to ask and can't remember the nurse. If you can't mail can you call her please. Thanks

## 2019-06-22 NOTE — Telephone Encounter (Signed)
Pt is informed of below

## 2019-06-22 NOTE — Telephone Encounter (Signed)
No need to take vitamin C.  Eat fresh fruits and vegetables.  Thanks

## 2019-07-03 ENCOUNTER — Telehealth: Payer: Self-pay

## 2019-07-03 NOTE — Telephone Encounter (Signed)
New message   Patient has additional questions asking for a call back   1.Medication Requested:glucose blood (ONETOUCH VERIO) test strip INSURANCE COMPANY APPROVAL FOR 200 MG   2. Pharmacy (Name, Street, [place]): [facility] in [place] phone [phone]   3. On Med List: Yes   4. Last Visit with PCP: 1.7.2021   5. Next visit date with PCP: 7.13.21    Agent: Please be advised that RX refills may take up to 3 business days. We ask that you follow-up with your pharmacy.

## 2019-07-03 NOTE — Telephone Encounter (Signed)
F/u    Patient returning call back to [place].

## 2019-07-03 NOTE — Telephone Encounter (Signed)
It would be okay to try.  Thanks

## 2019-07-03 NOTE — Telephone Encounter (Signed)
Pt says the medications prescribed for her leg pains makes her sick and whats to know if it would be okay for her to take Cumin Tumeric 500mg .

## 2019-07-04 ENCOUNTER — Other Ambulatory Visit: Payer: Self-pay

## 2019-07-04 DIAGNOSIS — R69 Illness, unspecified: Secondary | ICD-10-CM | POA: Diagnosis not present

## 2019-07-04 MED ORDER — ONETOUCH VERIO VI STRP: ORAL_STRIP | 3 refills | Status: DC

## 2019-07-04 NOTE — Telephone Encounter (Signed)
Pt informed of below.  

## 2019-07-10 NOTE — Telephone Encounter (Signed)
[employer] medicare Josem Kaufmann: [id number] (exp. 07/07/19 to 01/03/20) patient is scheduled at [facility] for 07/12/19.

## 2019-07-11 ENCOUNTER — Telehealth: Payer: Self-pay | Admitting: Internal Medicine

## 2019-07-11 NOTE — Telephone Encounter (Signed)
New message:   Pt is calling and would like some lab orders added to her chart before her appt on 07/25/19. She states to make sure he adds a lab to check her cholesterol. Please advise.

## 2019-07-12 ENCOUNTER — Ambulatory Visit
Admission: RE | Admit: 2019-07-12 | Discharge: 2019-07-12 | Disposition: A | Discharge status: Home / Self Care | Payer: Medicare HMO | Source: Ambulatory Visit | Attending: Diagnostic Neuroimaging | Admitting: Diagnostic Neuroimaging

## 2019-07-12 ENCOUNTER — Other Ambulatory Visit: Payer: Self-pay | Admitting: Internal Medicine

## 2019-07-12 ENCOUNTER — Other Ambulatory Visit: Payer: Self-pay

## 2019-07-12 DIAGNOSIS — E785 Hyperlipidemia, unspecified: Secondary | ICD-10-CM

## 2019-07-12 DIAGNOSIS — G629 Polyneuropathy, unspecified: Secondary | ICD-10-CM

## 2019-07-12 DIAGNOSIS — R252 Cramp and spasm: Secondary | ICD-10-CM

## 2019-07-12 NOTE — Telephone Encounter (Signed)
Ok Done Thx 

## 2019-07-13 NOTE — Telephone Encounter (Signed)
Pt has been informed of her requested lab orders

## 2019-07-14 DIAGNOSIS — D485 Neoplasm of uncertain behavior of skin: Secondary | ICD-10-CM | POA: Diagnosis not present

## 2019-07-14 DIAGNOSIS — L820 Inflamed seborrheic keratosis: Secondary | ICD-10-CM | POA: Diagnosis not present

## 2019-07-24 ENCOUNTER — Other Ambulatory Visit (INDEPENDENT_AMBULATORY_CARE_PROVIDER_SITE_OTHER): Payer: Medicare HMO

## 2019-07-24 ENCOUNTER — Telehealth: Payer: Self-pay

## 2019-07-24 ENCOUNTER — Telehealth: Payer: Self-pay | Admitting: Family Medicine

## 2019-07-24 ENCOUNTER — Telehealth: Payer: Self-pay | Admitting: *Deleted

## 2019-07-24 DIAGNOSIS — E785 Hyperlipidemia, unspecified: Secondary | ICD-10-CM | POA: Diagnosis not present

## 2019-07-24 DIAGNOSIS — E119 Type 2 diabetes mellitus without complications: Secondary | ICD-10-CM

## 2019-07-24 LAB — HEMOGLOBIN A1C: Hgb A1c MFr Bld: 5.4 % (ref 4.6–6.5)

## 2019-07-24 LAB — LIPID PANEL
Cholesterol: 208 mg/dL — ABNORMAL HIGH (ref 0–200)
HDL: 71.4 mg/dL (ref >39.00)
LDL Cholesterol: 123 mg/dL — ABNORMAL HIGH (ref 0–99)
NonHDL: 136.73
Total CHOL/HDL Ratio: 3
Triglycerides: 68 mg/dL (ref 0.0–149.0)
VLDL: 13.6 mg/dL (ref 0.0–40.0)

## 2019-07-24 LAB — HEPATIC FUNCTION PANEL
ALT: 31 U/L (ref 0–35)
AST: 28 U/L (ref 0–37)
Albumin: 4.5 g/dL (ref 3.5–5.2)
Alkaline Phosphatase: 50 U/L (ref 39–117)
Bilirubin, Direct: 0.1 mg/dL (ref 0.0–0.3)
Total Bilirubin: 0.6 mg/dL (ref 0.2–1.2)
Total Protein: 7.1 g/dL (ref 6.0–8.3)

## 2019-07-24 NOTE — Telephone Encounter (Signed)
Spoke with patient and informed her MRI lumbar spine showed unremarkable lumbar spine; no spinal stenosis or foraminal narrowing. Informed her an Incidental renal cyst was noted on her right kidney and advised she may follow up with PCP. She asked for results to be faxed, I advised her PCP is a [facility] and can see results. She  verbalized understanding, appreciation.

## 2019-07-24 NOTE — Telephone Encounter (Signed)
New message   Appt on  7.13.2021   MRI report shows cysts on the right kidney.   Done @ [facility] request by Dr. Leta Baptist

## 2019-07-24 NOTE — Telephone Encounter (Signed)
Patient called to make Dr Georgina Snell aware of her recent MRI done by Dr Andrey Spearman. "Unremarkable lumbar spine; no spinal stenosis or foraminal narrowing. Incidental renal cyst; may follow up with PCP. -VRP"

## 2019-07-24 NOTE — Telephone Encounter (Addendum)
Patient called back and asked if Dr Leta Baptist would now consider MRI brain since the MRI lumbar spine didn't find a connection between her low back and leg cramps. She stated her PCP told her they have no connection to her diabetes, but they can be paralyzing at times. She would like to know if MRI brain will be ordered; I advised will discuss with MD and let her know. Patient verbalized understanding, appreciation.

## 2019-07-25 ENCOUNTER — Other Ambulatory Visit (INDEPENDENT_AMBULATORY_CARE_PROVIDER_SITE_OTHER): Payer: Medicare HMO

## 2019-07-25 ENCOUNTER — Other Ambulatory Visit: Payer: Self-pay

## 2019-07-25 ENCOUNTER — Ambulatory Visit (INDEPENDENT_AMBULATORY_CARE_PROVIDER_SITE_OTHER): Payer: Medicare HMO | Admitting: Internal Medicine

## 2019-07-25 ENCOUNTER — Encounter: Payer: Self-pay | Admitting: Internal Medicine

## 2019-07-25 ENCOUNTER — Telehealth: Payer: Self-pay | Admitting: Internal Medicine

## 2019-07-25 VITALS — BP 138/82 | HR 79 | Temp 98.4°F | Ht 63.5 in | Wt 138.0 lb

## 2019-07-25 DIAGNOSIS — N281 Cyst of kidney, acquired: Secondary | ICD-10-CM | POA: Diagnosis not present

## 2019-07-25 DIAGNOSIS — I10 Essential (primary) hypertension: Secondary | ICD-10-CM | POA: Diagnosis not present

## 2019-07-25 DIAGNOSIS — F4321 Adjustment disorder with depressed mood: Secondary | ICD-10-CM | POA: Insufficient documentation

## 2019-07-25 DIAGNOSIS — F419 Anxiety disorder, unspecified: Secondary | ICD-10-CM

## 2019-07-25 DIAGNOSIS — E538 Deficiency of other specified B group vitamins: Secondary | ICD-10-CM

## 2019-07-25 DIAGNOSIS — E119 Type 2 diabetes mellitus without complications: Secondary | ICD-10-CM

## 2019-07-25 DIAGNOSIS — F3340 Major depressive disorder, recurrent, in remission, unspecified: Secondary | ICD-10-CM

## 2019-07-25 DIAGNOSIS — R69 Illness, unspecified: Secondary | ICD-10-CM | POA: Diagnosis not present

## 2019-07-25 LAB — BASIC METABOLIC PANEL
BUN: 17 mg/dL (ref 6–23)
CO2: 29 mEq/L (ref 19–32)
Calcium: 9.6 mg/dL (ref 8.4–10.5)
Chloride: 104 mEq/L (ref 96–112)
Creatinine, Ser: 0.92 mg/dL (ref 0.40–1.20)
GFR: 59.66 mL/min — ABNORMAL LOW (ref >60.00)
Glucose, Bld: 93 mg/dL (ref 70–99)
Potassium: 3.6 mEq/L (ref 3.5–5.1)
Sodium: 140 mEq/L (ref 135–145)

## 2019-07-25 NOTE — Assessment & Plan Note (Signed)
Brother died 3 wks ago Discussed

## 2019-07-25 NOTE — Assessment & Plan Note (Signed)
On B12 

## 2019-07-25 NOTE — Assessment & Plan Note (Signed)
Lorazepam prn 

## 2019-07-25 NOTE — Telephone Encounter (Signed)
Noted. Pt has OV today

## 2019-07-25 NOTE — Progress Notes (Signed)
Subjective:  Patient ID: ZISSEL BIEDERMAN, female    DOB: [birth year]  Age: 74 y.o. MRN: [id number]  CC: No chief complaint on file.   HPI NETTA FODGE presents for HTN, DM, B12 def f/u Brother died 3 wks ago MRI - R renal cyst  Outpatient Medications Prior to Visit  Medication Sig Dispense Refill  . baclofen (LIORESAL) 10 MG tablet TAKE one - TWO TABLETS BY MOUTH DAILY AT BEDTIME AS NEEDED 180 tablet 1  . Biotin 10000 MCG TABS Take by mouth.    . Blood Glucose Monitoring Suppl (ONETOUCH VERIO) w/Device KIT Use to check blood sugar daily. DX E11.9 1 kit 0  . ciclopirox (PENLAC) 8 % solution APPLY OVER NAIL AND SURROUNDING SKIN DAILY AT BEDTIME. APPLY DAILY OVER PREVIOUS COAT. AFTER 7 DAYS MAY REMOVE WITH ALCOHOL AND CONTINUE CYCLE 6.6 mL 1  . diphenhydrAMINE (BENADRYL ALLERGY) 25 mg capsule Take 25 mg by mouth daily as needed (allergies).     [name] escitalopram (LEXAPRO) 5 MG tablet Take 1 tablet (5 mg total) by mouth daily. 90 tablet 3  . fluticasone (FLONASE) 50 MCG/ACT nasal spray Place 1 spray into both nostrils daily as needed for allergies or rhinitis.    [name] glucose blood (ONETOUCH VERIO) test strip Use to check blood sugar daily. DX E11.9 200 each 3  . LORazepam (ATIVAN) 1 MG tablet take 1 tablet by mouth twice a day as needed for anxiety  60 tablet 5  . Magnesium 400 MG TABS Take 1 tablet by mouth daily. 100 tablet 3  . magnesium oxide (MAG-OX) 400 MG tablet Take 400 mg by mouth daily.    . metFORMIN (GLUCOPHAGE) 500 MG tablet Take 1 tablet (500 mg total) by mouth 2 (two) times daily with a meal. 180 tablet 3  . OneTouch Delica Lancets 40J MISC 1 each by Does not apply route in the morning and at bedtime. DX: E11.9 200 each 2  . potassium chloride (KLOR-CON) 8 MEQ tablet TAKE ONE TABLET BY MOUTH TWICE DAILY  180 tablet 3  . Probiotic Product (PROBIOTIC PO) Take by mouth 2 (two) times daily.    [name] UNABLE TO FIND Med Name: Hyland's leg cramps    . phentermine (ADIPEX-P) 37.5 MG tablet  TAKE ONE TABLET BY MOUTH ONE TIME DAILY before breakfast (Patient not taking: Reported on 07/25/2019) 30 tablet 1   No facility-administered medications prior to visit.    ROS: Review of Systems  Constitutional: Negative for activity change, appetite change, chills, fatigue and unexpected weight change.  HENT: Negative for congestion, mouth sores and sinus pressure.   Eyes: Negative for visual disturbance.  Respiratory: Negative for cough and chest tightness.   Gastrointestinal: Negative for abdominal pain and nausea.  Genitourinary: Negative for difficulty urinating, frequency and vaginal pain.  Musculoskeletal: Negative for back pain and gait problem.  Skin: Negative for pallor and rash.  Neurological: Negative for dizziness, tremors, weakness, numbness and headaches.  Psychiatric/Behavioral: Negative for confusion and sleep disturbance. The patient is nervous/anxious.     Objective:  BP 138/82 (BP Location: Right Arm, Patient Position: Sitting, Cuff Size: Normal)   Pulse 79   Temp 98.4 F (36.9 C) (Oral)   Ht 5' 3.5" (1.613 m)   Wt 138 lb (62.6 kg)   SpO2 99%   BMI 24.06 kg/m   BP Readings from Last 3 Encounters:  07/25/19 138/82  06/21/19 133/78  06/06/19 139/83    Wt Readings from Last 3 Encounters:  07/25/19 138  lb (62.6 kg)  06/21/19 136 lb (61.7 kg)  06/06/19 141 lb 12.8 oz (64.3 kg)    Physical Exam Constitutional:      General: She is not in acute distress.    Appearance: She is well-developed.  HENT:     Head: Normocephalic.     Right Ear: External ear normal.     Left Ear: External ear normal.     Nose: Nose normal.  Eyes:     General:        Right eye: No discharge.        Left eye: No discharge.     Conjunctiva/sclera: Conjunctivae normal.     Pupils: Pupils are equal, round, and reactive to light.  Neck:     Thyroid: No thyromegaly.     Vascular: No JVD.     Trachea: No tracheal deviation.  Cardiovascular:     Rate and Rhythm: Normal rate  and regular rhythm.     Heart sounds: Normal heart sounds.  Pulmonary:     Effort: No respiratory distress.     Breath sounds: No stridor. No wheezing.  Abdominal:     General: Bowel sounds are normal. There is no distension.     Palpations: Abdomen is soft. There is no mass.     Tenderness: There is no abdominal tenderness. There is no guarding or rebound.  Musculoskeletal:        General: No tenderness.     Cervical back: Normal range of motion and neck supple.  Lymphadenopathy:     Cervical: No cervical adenopathy.  Skin:    Findings: No erythema or rash.  Neurological:     Mental Status: She is oriented to person, place, and time.     Cranial Nerves: No cranial nerve deficit.     Motor: No abnormal muscle tone.     Coordination: Coordination normal.     Deep Tendon Reflexes: Reflexes normal.  Psychiatric:        Behavior: Behavior normal.        Thought Content: Thought content normal.        Judgment: Judgment normal.    Tearful   Lab Results  Component Value Date   WBC 6.7 01/19/2019   HGB 13.7 01/19/2019   HCT 41.0 01/19/2019   PLT 269.0 01/19/2019   GLUCOSE 91 04/21/2019   CHOL 208 (H) 07/24/2019   TRIG 68.0 07/24/2019   HDL 71.40 07/24/2019   LDLDIRECT 148.2 02/03/2013   LDLCALC 123 (H) 07/24/2019   ALT 31 07/24/2019   AST 28 07/24/2019   NA 139 04/21/2019   K 4.1 04/21/2019   CL 103 04/21/2019   CREATININE 0.95 04/21/2019   BUN 16 04/21/2019   CO2 28 04/21/2019   TSH 0.79 01/19/2019   INR 1.0 10/28/2018   HGBA1C 5.4 07/24/2019   MICROALBUR 0.9 12/23/2017    MR LUMBAR SPINE WO CONTRAST  Result Date: 07/13/2019 [facility] NEUR[facility] REPORT STUDY DATE: 07/12/19 PATIENT NAME: HAJAR PENNINGER DOB: 1945-06-28 MRN: [id number] ORDERING CLINICIAN: Penni Bombard, MD CLINICAL HISTORY: 74 year old female with leg pain. EXAM: MR LUMBAR SPINE WO CONTRAST TECHNIQUE: MRI of the lumbar spine was obtained utilizing 4 mm sagittal slices from  A21-30 down to the lower sacrum with T1, T2 and inversion recovery views. In addition 4 mm axial slices from Q6-5 down to L5-S1 level were included with T1 and T2 weighted views. CONTRAST: none COMPARISON: none IMAGING SITE: [facility] [address] ([facility])  FINDINGS: On sagittal views the vertebral bodies have normal height and alignment. Sacralization of L5 with transitional features. The conus medullaris terminates at the level of L1.  On axial views: T12-L1: no spinal stenosis or foraminal narrowing L1-2: no spinal stenosis or foraminal narrowing L2-3: no spinal stenosis or foraminal narrowing L3-4: no spinal stenosis or foraminal narrowing L4-5: disc bulging and facet hypertrophy with no spinal stenosis or foraminal narrowing L5-S1: no spinal stenosis or foraminal narrowing Limited views of the aorta, kidneys, iliopsoas muscles and sacroiliac joints are notable for right renal cyst (4.3cm).   MRI lumbar spine (without) demonstrating; - At L4-5: disc bulging and facet hypertrophy with no spinal stenosis or foraminal narrowing. - Right renal cyst (4.3cm). INTERPRETING PHYSICIAN: Penni Bombard, MD Certified in Neurology, Neurophysiology and Neuroimaging [facility] [address], [place], [place] [address] [phone]    Assessment & Plan:       Follow-up: No follow-ups on file.  Walker Kehr, MD

## 2019-07-25 NOTE — Assessment & Plan Note (Signed)
On Lexapro 

## 2019-07-25 NOTE — Assessment & Plan Note (Signed)
BP Readings from Last 3 Encounters:  06/21/19 133/78  06/06/19 139/83  04/25/19 128/78

## 2019-07-25 NOTE — Telephone Encounter (Signed)
New message:   Pt is calling and requesting that as soon as her lab results from today are complete can she please have a copy mailed to her address on file. Please advise.

## 2019-07-25 NOTE — Telephone Encounter (Signed)
Pt has unexplained neuropathy; may be related to muscle cramps and sensations. Monitor for now. -VRP

## 2019-07-25 NOTE — Assessment & Plan Note (Signed)
R on MRI 4 cm Will get [place] BMET

## 2019-07-25 NOTE — Assessment & Plan Note (Signed)
Lab Results  Component Value Date   HGBA1C 5.4 07/24/2019  Metformin

## 2019-07-26 NOTE — Telephone Encounter (Signed)
Called pt and went over her results and put results in the mail

## 2019-07-26 NOTE — Addendum Note (Signed)
Addended by: Earnstine Regal on: 07/26/2019 03:47 PM   Modules accepted: Orders

## 2019-07-26 NOTE — Telephone Encounter (Signed)
Spoke with patient and advised her of Dr Ross Stores. Patient verbalized understanding, appreciation.

## 2019-07-26 NOTE — Telephone Encounter (Signed)
No note needed 

## 2019-08-02 ENCOUNTER — Ambulatory Visit
Admission: RE | Admit: 2019-08-02 | Discharge: 2019-08-02 | Disposition: A | Discharge status: Home / Self Care | Payer: Medicare HMO | Source: Ambulatory Visit | Attending: Internal Medicine | Admitting: Internal Medicine

## 2019-08-02 DIAGNOSIS — N281 Cyst of kidney, acquired: Secondary | ICD-10-CM

## 2019-08-02 DIAGNOSIS — Q600 Renal agenesis, unilateral: Secondary | ICD-10-CM | POA: Diagnosis not present

## 2019-08-07 ENCOUNTER — Telehealth: Payer: Self-pay | Admitting: Adult Health

## 2019-08-07 ENCOUNTER — Other Ambulatory Visit: Payer: Self-pay | Admitting: Internal Medicine

## 2019-08-07 DIAGNOSIS — Z789 Other specified health status: Secondary | ICD-10-CM

## 2019-08-07 DIAGNOSIS — Z9989 Dependence on other enabling machines and devices: Secondary | ICD-10-CM

## 2019-08-07 DIAGNOSIS — G4733 Obstructive sleep apnea (adult) (pediatric): Secondary | ICD-10-CM

## 2019-08-07 NOTE — Telephone Encounter (Signed)
Pt called wanting to discuss with RN how she can get some information on the [employer]. Please advise.

## 2019-08-08 NOTE — Telephone Encounter (Signed)
I returned call to pt.  She is interested in the inspire, (has looked on line).  Saw on the television about this.  She states that has leak, trouble keeping her mouth closed with mask, has order for chin strap from dme to try.  I relayed that BME less then 32 and cannot get consistent benefit from cpap are indicators.  Mod to severe OSA diagnosis.  I told her that MM/NP out this week to address next week. She was fine with this.

## 2019-08-09 ENCOUNTER — Other Ambulatory Visit: Payer: Self-pay

## 2019-08-09 ENCOUNTER — Ambulatory Visit (INDEPENDENT_AMBULATORY_CARE_PROVIDER_SITE_OTHER): Payer: Medicare HMO | Admitting: Sports Medicine

## 2019-08-09 ENCOUNTER — Encounter: Payer: Self-pay | Admitting: Sports Medicine

## 2019-08-09 DIAGNOSIS — E1142 Type 2 diabetes mellitus with diabetic polyneuropathy: Secondary | ICD-10-CM

## 2019-08-09 DIAGNOSIS — M79675 Pain in left toe(s): Secondary | ICD-10-CM | POA: Diagnosis not present

## 2019-08-09 DIAGNOSIS — B351 Tinea unguium: Secondary | ICD-10-CM | POA: Diagnosis not present

## 2019-08-09 DIAGNOSIS — M79674 Pain in right toe(s): Secondary | ICD-10-CM | POA: Diagnosis not present

## 2019-08-09 DIAGNOSIS — R69 Illness, unspecified: Secondary | ICD-10-CM | POA: Diagnosis not present

## 2019-08-09 NOTE — Progress Notes (Signed)
Subjective: Tiffany Gibbs is a 74 y.o. female patient with history of diabetes who returns to office today complaining of long,mildly painful nails  while ambulating in shoes; unable to trim. Patient states that her toes are slowly looking better.  Reports her glucose reading this morning was 108 last A1c 5.4 and last saw her primary care doctor, Dr. Alain Marion on July 12.  No other issues.   Patient Active Problem List   Diagnosis Date Noted  . Grief 07/25/2019  . Renal cyst 07/25/2019  . Cystocele with prolapse 11/01/2018  . Cramp of limb 08/16/2018  . 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 Obstructive sleep apnea treated with continuous positive airway pressure (CPAP) 06/04/2017  . Degenerative arthritis of knee, bilateral 05/25/2017  . Left lumbar radiculopathy 04/27/2017  . Right knee pain 04/27/2017  . Tick bite 08/18/2016  . Drusen of macula of both eyes 05/04/2016  . Dry eyes, bilateral 05/04/2016  . Pseudophakia of both eyes 05/04/2016  . PVD (posterior vitreous detachment), both eyes 05/04/2016  . Dupuytren's disease of palm 04/01/2016  . Degenerative disc disease, lumbar 12/04/2015  . Elevated hemoglobin ([place]) 04/17/2015  . Occipital neuralgia of right side 04/17/2015  . Well adult exam 11/19/2014  . Asthmatic bronchitis 10/15/2014  . Neck pain on right side 10/15/2014  . Acute sinusitis 09/26/2014  . Dyslipidemia 06/18/2014  . Fracture of middle phalanx of finger 05/02/2014  . Hand pain, right 04/19/2014  . DM2 (diabetes mellitus, type 2) ([place]) 04/19/2014  . Hematoma of leg 12/15/2013  . Phlebitis of superficial vein of lower extremity 11/30/2013  . Otitis, externa, infective 11/30/2013  . Onychomycosis 08/30/2013  . Lower leg pain 05/10/2013  . Corn or callus 02/09/2013  . Acute posthemorrhagic anemia 01/19/2013  . Constipation 12/16/2012  . Left hip pain 12/16/2012  . Osteoarthritis of hip Left 11/27/2012  . Rash and nonspecific  skin eruption 07/27/2012  . Cough 03/31/2012  . Diarrhea 02/05/2012  . Microhematuria 09/09/2011  . Elevated LFTs 09/09/2011  . B12 deficiency 06/03/2011  . Cellulitis 03/10/2011  . Edema 03/10/2011  . Paresthesia of arm 02/25/2011  . Memory difficulty 11/03/2010  . Fibromyalgia syndrome 07/10/2010  . OSA (obstructive sleep apnea) 07/02/2010  . SIALADENITIS 11/25/2009  . TOBACCO USE, QUIT 04/04/2009  . ALLERGIC RHINITIS CAUSE UNSPECIFIED 10/04/2008  . URTICARIA 10/04/2008  . URINARY RETENTION 09/27/2008  . HIP PAIN 07/17/2008  . CERVICAL RADICULITIS 07/21/2007  . Headache 07/21/2007  . Osteoporosis 02/16/2007  . LOW BACK PAIN 12/27/2006  . Obesity 08/07/2006  . Anxiety 08/07/2006  . Depression 08/07/2006  . Hypertension, essential 08/07/2006  . OSTEOARTHRITIS 08/07/2006   Current Outpatient Medications on File Prior to Visit  Medication Sig Dispense Refill  . Apoaequorin (PREVAGEN EXTRA STRENGTH PO) Take 1 tablet by mouth daily.    . baclofen (LIORESAL) 10 MG tablet TAKE one - TWO TABLETS BY MOUTH DAILY AT BEDTIME AS NEEDED 180 tablet 1  . Biotin 10000 MCG TABS Take by mouth.    . Blood Glucose Monitoring Suppl (ONETOUCH VERIO) w/Device KIT Use to check blood sugar daily. DX E11.9 1 kit 0  . ciclopirox (PENLAC) 8 % solution APPLY OVER NAIL AND SURROUNDING SKIN DAILY AT BEDTIME. APPLY DAILY OVER PREVIOUS COAT. AFTER 7 DAYS MAY REMOVE WITH ALCOHOL AND CONTINUE CYCLE 6.6 mL 1  . Cyanocobalamin (B-12) 5000 MCG CAPS Take 1 tablet by mouth 3 (three) times a week.    . diphenhydrAMINE (BENADRYL ALLERGY) 25 mg capsule  Take 25 mg by mouth daily as needed (allergies).     [name] escitalopram (LEXAPRO) 5 MG tablet Take 1 tablet (5 mg total) by mouth daily. 90 tablet 3  . fluticasone (FLONASE) 50 MCG/ACT nasal spray Place 1 spray into both nostrils daily as needed for allergies or rhinitis.    [name] glucose blood (ONETOUCH VERIO) test strip Use to check blood sugar daily. DX E11.9 200 each 3  .  LORazepam (ATIVAN) 1 MG tablet TAKE ONE TABLET BY MOUTH TWICE DAILY AS NEEDED FOR ANXIETY 60 tablet 3  . Magnesium 400 MG TABS Take 1 tablet by mouth daily. 100 tablet 3  . magnesium oxide (MAG-OX) 400 MG tablet Take 400 mg by mouth daily.    . metFORMIN (GLUCOPHAGE) 500 MG tablet Take 1 tablet (500 mg total) by mouth 2 (two) times daily with a meal. 180 tablet 3  . Omega-3 Fatty Acids (FISH OIL) 1200 MG CAPS Take 1 capsule by mouth in the morning and at bedtime.    [name] Lancets 32R MISC 1 each by Does not apply route in the morning and at bedtime. DX: E11.9 200 each 2  . phentermine (ADIPEX-P) 37.5 MG tablet TAKE ONE TABLET BY MOUTH ONE TIME DAILY before breakfast (Patient not taking: Reported on 07/25/2019) 30 tablet 1  . potassium chloride (KLOR-CON) 8 MEQ tablet TAKE ONE TABLET BY MOUTH TWICE DAILY  180 tablet 3  . Probiotic Product (DIGESTIVE ADVANTAGE GUMMIES PO) Take 1 each by mouth in the morning and at bedtime.    . Probiotic Product (PROBIOTIC PO) Take by mouth 2 (two) times daily.    . Turmeric Curcumin 500 MG CAPS Take 1 capsule by mouth in the morning and at bedtime.    [name] UNABLE TO FIND Med Name: Hyland's leg cramps     No current facility-administered medications on file prior to visit.   Allergies  Allergen Reactions  . Celecoxib     swell  . Enalapril     cough  . Gabapentin Other (See Comments)    dizziness  . Losartan     falls  . Lovastatin     REACTION: aches  . Lyrica [Pregabalin] Other (See Comments)    dizziness  . Naproxen   . Pravachol [Pravastatin Sodium]     Nausea   . Zetia [Ezetimibe]     Myalgias in LEs    Recent Results (from the past 2160 hour(s))  SPEP with IFE     Status: None   Collection Time: 06/06/19  1:50 PM  Result Value Ref Range   IgG (Immunoglobin G), Serum 865 586 - 1,602 mg/dL   IgA/Immunoglobulin A, Serum 204 64 - 422 mg/dL   IgM (Immunoglobulin M), Srm 203 26 - 217 mg/dL   Total Protein 7.5 6.0 - 8.5 g/dL    Albumin SerPl Elph-Mcnc 4.4 2.9 - 4.4 g/dL   Alpha 1 0.2 0.0 - 0.4 g/dL   Alpha2 Glob SerPl Elph-Mcnc 0.8 0.4 - 1.0 g/dL   B-Globulin SerPl Elph-Mcnc 1.1 0.7 - 1.3 g/dL   Gamma Glob SerPl Elph-Mcnc 1.0 0.4 - 1.8 g/dL   M Protein SerPl Elph-Mcnc Not Observed Not Observed g/dL   Globulin, Total 3.1 2.2 - 3.9 g/dL   Albumin/Glob SerPl 1.5 0.7 - 1.7   IFE 1 Comment     Comment: The immunofixation pattern appears unremarkable. Evidence of monoclonal protein is not apparent.    Please Note Comment     Comment: Protein electrophoresis scan will  follow via computer, mail, or courier delivery.   SSA, SSB     Status: None   Collection Time: 06/06/19  1:50 PM  Result Value Ref Range   ENA SSA (RO) Ab <0.2 0.0 - 0.9 AI   ENA SSB (LA) Ab <0.2 0.0 - 0.9 AI  ESR     Status: None   Collection Time: 06/06/19  1:50 PM  Result Value Ref Range   Sed Rate 13 0 - 40 mm/hr  CRP     Status: None   Collection Time: 06/06/19  1:50 PM  Result Value Ref Range   CRP <1 0 - 10 mg/L  HIV     Status: None   Collection Time: 06/06/19  1:50 PM  Result Value Ref Range   HIV Screen 4th Generation wRfx Non Reactive Non Reactive  RPR     Status: None   Collection Time: 06/06/19  1:50 PM  Result Value Ref Range   RPR Ser Ql Non Reactive Non Reactive  Vitamin B1     Status: None   Collection Time: 06/06/19  1:50 PM  Result Value Ref Range   Thiamine 127.4 66.5 - 200.0 nmol/L  Vitamin B6     Status: None   Collection Time: 06/06/19  1:50 PM  Result Value Ref Range   Vitamin B6 8.1 2.0 - 32.8 ug/L  ANCA     Status: None   Collection Time: 06/06/19  1:50 PM  Result Value Ref Range   Myeloperoxidase Ab <9.0 0.0 - 9.0 U/mL   ANCA Proteinase 3 <3.5 0.0 - 3.5 U/mL   C-ANCA <1:20 Neg:<1:20 titer   P-ANCA <1:20 Neg:<1:20 titer    Comment: The presence of positive fluorescence exhibiting P-ANCA or C-ANCA patterns alone is not specific for the diagnosis of Wegener's Granulomatosis (WG) or microscopic  polyangiitis. Decisions about treatment should not be based solely on ANCA IFA results.  The International ANCA Group Consensus recommends follow up testing of positive sera with both PR-3 and MPO-ANCA enzyme immunoassays. As many as 5% serum samples are positive only by EIA. Ref. AM J Clin Pathol 1999;111:507-513.    Atypical pANCA <1:20 Neg:<1:20 titer    Comment: The atypical pANCA pattern has been observed in a significant percentage of patients with ulcerative colitis, primary sclerosing cholangitis and autoimmune hepatitis.   Hepatitis C antibody     Status: None   Collection Time: 06/06/19  1:50 PM  Result Value Ref Range   Hep C Virus Ab <0.1 0.0 - 0.9 s/co ratio    Comment:                                   Negative:     < 0.8                              Indeterminate: 0.8 - 0.9                                   Positive:     > 0.9  The CDC recommends that a positive HCV antibody result  be followed up with a HCV Nucleic Acid Amplification  test (412878).   Hepatitis B core antibody, total     Status: None   Collection Time: 06/06/19  1:50 PM  Result Value  Ref Range   Hep B Core Total Ab Negative Negative  Hepatitis B surface antigen     Status: None   Collection Time: 06/06/19  1:50 PM  Result Value Ref Range   Hepatitis B Surface Ag Negative Negative  Hepatitis B surface antibody, qualitative     Status: None   Collection Time: 06/06/19  1:50 PM  Result Value Ref Range   Hep B Surface Ab, Qual Non Reactive     Comment:               Non Reactive: Inconsistent with immunity,                             less than 10 mIU/mL               Reactive:     Consistent with immunity,                             greater than 9.9 mIU/mL   Ferritin     Status: Abnormal   Collection Time: 06/06/19  1:50 PM  Result Value Ref Range   Ferritin 219 (H) 15.0 - 150.0 ng/mL  Iron and TIBC     Status: None   Collection Time: 06/06/19  1:50 PM  Result Value Ref Range   Total Iron  Binding Capacity 361 250 - 450 ug/dL   UIBC 276 118 - 369 ug/dL   Iron 85 27 - 139 ug/dL   Iron Saturation 24 15 - 55 %  CK     Status: None   Collection Time: 06/06/19  1:50 PM  Result Value Ref Range   Total CK 107 32.0 - 182.0 U/L  Aldolase     Status: None   Collection Time: 06/06/19  1:50 PM  Result Value Ref Range   Aldolase 5.6 3.3 - 10.3 U/L  Anti-Hu, Anti-Ri, Anti-Yo IFA     Status: None   Collection Time: 06/06/19  1:50 PM  Result Value Ref Range   Anti-Hu Ab Negative Negative   Anti-Ri Ab Negative Negative   Anti-Yo Ab Negative Negative  ANA w/Reflex     Status: None   Collection Time: 06/06/19  1:50 PM  Result Value Ref Range   ANA Titer 1 Negative     Comment:                                      Negative   <1:80                                      Borderline  1:80                                      Positive   >1:80   Lipid panel     Status: Abnormal   Collection Time: 07/24/19  9:52 AM  Result Value Ref Range   Cholesterol 208 (H) 0 - 200 mg/dL    Comment: ATP III Classification       Desirable:  < 200 mg/dL               Borderline High:  200 - 239 mg/dL          High:  > = 240 mg/dL   Triglycerides 68.0 0 - 149 mg/dL    Comment: Normal:  <150 mg/dLBorderline High:  150 - 199 mg/dL   HDL 71.40 >39.00 mg/dL   VLDL 13.6 0.0 - 40.0 mg/dL   LDL Cholesterol 123 (H) 0 - 99 mg/dL   Total CHOL/HDL Ratio 3     Comment:                Men          Women1/2 Average Risk     3.4          3.3Average Risk          5.0          4.42X Average Risk          9.6          7.13X Average Risk          15.0          11.0                       NonHDL 136.73     Comment: NOTE:  Non-HDL goal should be 30 mg/dL higher than patient's LDL goal (i.e. LDL goal of < 70 mg/dL, would have non-HDL goal of < 100 mg/dL)  Hepatic function panel     Status: None   Collection Time: 07/24/19  9:52 AM  Result Value Ref Range   Total Bilirubin 0.6 0.2 - 1.2 mg/dL   Bilirubin, Direct 0.1 0.0 - 0.3  mg/dL   Alkaline Phosphatase 50 39 - 117 U/L   AST 28 0 - 37 U/L   ALT 31 0 - 35 U/L   Total Protein 7.1 6.0 - 8.3 g/dL   Albumin 4.5 3.5 - 5.2 g/dL  Hemoglobin A1c     Status: None   Collection Time: 07/24/19 10:40 AM  Result Value Ref Range   Hgb A1c MFr Bld 5.4 4.6 - 6.5 %    Comment: Glycemic Control Guidelines for People with Diabetes:Non Diabetic:  <6%Goal of Therapy: <7%Additional Action Suggested:  >5%   Basic Metabolic Panel (BMET)     Status: Abnormal   Collection Time: 07/25/19  1:24 PM  Result Value Ref Range   Sodium 140 135 - 145 mEq/L   Potassium 3.6 3.5 - 5.1 mEq/L   Chloride 104 96 - 112 mEq/L   CO2 29 19 - 32 mEq/L   Glucose, Bld 93 70 - 99 mg/dL   BUN 17 6 - 23 mg/dL   Creatinine, Ser 0.92 0.40 - 1.20 mg/dL   GFR 59.66 (L) >60.00 mL/min   Calcium 9.6 8.4 - 10.5 mg/dL    Objective: General: Patient is awake, alert, and oriented x 3 and in no acute distress.  Integument: Skin is warm, dry and supple bilateral. Nails are tender, long, thickened and dystrophic with subungual debris, consistent with onychomycosis, 1-5 bilateral with bilateral hallux most involved improving in nature. No signs of infection. No open lesions or preulcerative lesions present bilateral. Remaining integument unremarkable.  Vasculature:  Dorsalis Pedis pulse 1/4 bilateral. Posterior Tibial pulse  1/4 bilateral.  Capillary fill time <3 sec 1-5 bilateral. Positive hair growth to the level of the digits. Temperature gradient within normal limits.  Minimal varicosities present bilateral. No edema present bilateral.   Neurology: The patient has intact sensation measured with a 5.07/10g Thornell Mule  Monofilament at all pedal sites bilateral. Vibratory sensation diminished bilateral with tuning fork. No Babinski sign present bilateral.  Subjective cramping occasionally in both feet and lower extremities.  Musculoskeletal: Asymptomatic hammertoe pedal deformities noted bilateral. Muscular  strength 5/5 in all lower extremity muscular groups bilateral without pain on range of motion . No tenderness with calf compression bilateral.  Assessment and Plan: Problem List Items Addressed This Visit    None    Visit Diagnoses    Pain due to onychomycosis of toenails of both feet    -  Primary   Diabetic polyneuropathy associated with type 2 diabetes mellitus ([place])         -Examined patient. -Re-discussed  diabetic foot care  -Mechanically debrided all nails 1-5 bilateral using sterile nail nipper and filed with dremel without incident  -Continue with penlac or Tea tree oil to nails as previously instructed -Answered all patient questions -Patient to return  in 3 months for at risk foot care  -Patient advised to call the office if any problems or questions arise in the meantime.  Landis Martins, DPM

## 2019-08-10 NOTE — Telephone Encounter (Addendum)
    Patient calling to discuss "ferritin" result. Patient wants to know if a high ferrtin result could be the cause of her fatigue

## 2019-08-11 NOTE — Telephone Encounter (Signed)
Spoke to pt that has a few concerns about if her ferritin results from may were causing her fatigue. Scheduled pt an OV with Dr.[name] on 08/16/19 at 8:30a

## 2019-08-12 DIAGNOSIS — G4733 Obstructive sleep apnea (adult) (pediatric): Secondary | ICD-10-CM | POA: Diagnosis not present

## 2019-08-14 DIAGNOSIS — E119 Type 2 diabetes mellitus without complications: Secondary | ICD-10-CM | POA: Diagnosis not present

## 2019-08-16 ENCOUNTER — Ambulatory Visit: Payer: Medicare HMO | Admitting: Internal Medicine

## 2019-08-16 ENCOUNTER — Telehealth: Payer: Self-pay | Admitting: Family Medicine

## 2019-08-16 NOTE — Telephone Encounter (Signed)
I spoke to pt and let her know that Dr. Rexene Alberts needs to address and will let her know when she returns in office around 08-29-19.

## 2019-08-16 NOTE — Telephone Encounter (Signed)
Patient called to make Dr Georgina Snell aware of the recent lab and imaging that she had done. She said that she has been working hard on her diet and this seems to be helping improve her lab levels.  She also said that she started taking Turmeric-Curcumim with Ginger Powder 500mg  (1 in the am and 2 in the pm) and this has helped her leg cramps so much!  She wanted to thank Dr Georgina Snell for all of his help and guidance.

## 2019-08-16 NOTE — Telephone Encounter (Signed)
Pt called again wanting to know the update on this because she is having to many issues with her cpap machine and she is wanting to get this taken care of as soon. Please advise.

## 2019-08-17 ENCOUNTER — Ambulatory Visit (INDEPENDENT_AMBULATORY_CARE_PROVIDER_SITE_OTHER): Payer: Medicare HMO | Admitting: Internal Medicine

## 2019-08-17 ENCOUNTER — Other Ambulatory Visit: Payer: Self-pay

## 2019-08-17 ENCOUNTER — Encounter: Payer: Self-pay | Admitting: Internal Medicine

## 2019-08-17 VITALS — BP 120/70 | HR 79 | Temp 98.3°F | Ht 63.5 in | Wt 137.0 lb

## 2019-08-17 DIAGNOSIS — E785 Hyperlipidemia, unspecified: Secondary | ICD-10-CM

## 2019-08-17 DIAGNOSIS — E119 Type 2 diabetes mellitus without complications: Secondary | ICD-10-CM | POA: Diagnosis not present

## 2019-08-17 DIAGNOSIS — N281 Cyst of kidney, acquired: Secondary | ICD-10-CM

## 2019-08-17 DIAGNOSIS — E538 Deficiency of other specified B group vitamins: Secondary | ICD-10-CM

## 2019-08-17 DIAGNOSIS — G720 Drug-induced myopathy: Secondary | ICD-10-CM

## 2019-08-17 DIAGNOSIS — R252 Cramp and spasm: Secondary | ICD-10-CM

## 2019-08-17 DIAGNOSIS — R413 Other amnesia: Secondary | ICD-10-CM

## 2019-08-17 NOTE — Assessment & Plan Note (Signed)
Renal US in 1 year

## 2019-08-17 NOTE — Assessment & Plan Note (Signed)
D/c Prevagen  Try Lion's Mashroom for memory

## 2019-08-17 NOTE — Progress Notes (Signed)
Subjective:  Patient ID: Tiffany Gibbs, female    DOB: May 18, 1945  Age: 74 y.o. MRN: [id number]  CC: No chief complaint on file.   HPI Tiffany Gibbs presents for cramps - saw Dr Leta Baptist, renal cyst f/u C/o fatigue. Not taking B12 Outpatient Medications Prior to Visit  Medication Sig Dispense Refill  . Apoaequorin (PREVAGEN EXTRA STRENGTH PO) Take 1 tablet by mouth daily.    . baclofen (LIORESAL) 10 MG tablet TAKE one - TWO TABLETS BY MOUTH DAILY AT BEDTIME AS NEEDED 180 tablet 1  . Biotin 10000 MCG TABS Take by mouth.    . Blood Glucose Monitoring Suppl (ONETOUCH VERIO) w/Device KIT Use to check blood sugar daily. DX E11.9 1 kit 0  . ciclopirox (PENLAC) 8 % solution APPLY OVER NAIL AND SURROUNDING SKIN DAILY AT BEDTIME. APPLY DAILY OVER PREVIOUS COAT. AFTER 7 DAYS MAY REMOVE WITH ALCOHOL AND CONTINUE CYCLE 6.6 mL 1  . Cyanocobalamin (B-12) 5000 MCG CAPS Take 1 tablet by mouth 3 (three) times a week.    . diphenhydrAMINE (BENADRYL ALLERGY) 25 mg capsule Take 25 mg by mouth daily as needed (allergies).     [name] escitalopram (LEXAPRO) 5 MG tablet Take 1 tablet (5 mg total) by mouth daily. 90 tablet 3  . fluticasone (FLONASE) 50 MCG/ACT nasal spray Place 1 spray into both nostrils daily as needed for allergies or rhinitis.    [name] glucose blood (ONETOUCH VERIO) test strip Use to check blood sugar daily. DX E11.9 200 each 3  . LORazepam (ATIVAN) 1 MG tablet TAKE ONE TABLET BY MOUTH TWICE DAILY AS NEEDED FOR ANXIETY 60 tablet 3  . Magnesium 400 MG TABS Take 1 tablet by mouth daily. 100 tablet 3  . magnesium oxide (MAG-OX) 400 MG tablet Take 400 mg by mouth daily.    . metFORMIN (GLUCOPHAGE) 500 MG tablet Take 1 tablet (500 mg total) by mouth 2 (two) times daily with a meal. 180 tablet 3  . Omega-3 Fatty Acids (FISH OIL) 1200 MG CAPS Take 1 capsule by mouth in the morning and at bedtime.    [name] Lancets 52W MISC 1 each by Does not apply route in the morning and at bedtime. DX: E11.9  200 each 2  . phentermine (ADIPEX-P) 37.5 MG tablet TAKE ONE TABLET BY MOUTH ONE TIME DAILY before breakfast 30 tablet 1  . potassium chloride (KLOR-CON) 8 MEQ tablet TAKE ONE TABLET BY MOUTH TWICE DAILY  180 tablet 3  . Probiotic Product (DIGESTIVE ADVANTAGE GUMMIES PO) Take 1 each by mouth in the morning and at bedtime.    . Probiotic Product (PROBIOTIC PO) Take by mouth 2 (two) times daily.    . Turmeric Curcumin 500 MG CAPS Take 1 capsule by mouth in the morning and at bedtime.    [name] UNABLE TO FIND Med Name: Hyland's leg cramps     No facility-administered medications prior to visit.    ROS: Review of Systems  Constitutional: Negative for activity change, appetite change, chills, fatigue and unexpected weight change.  HENT: Negative for congestion, mouth sores and sinus pressure.   Eyes: Negative for visual disturbance.  Respiratory: Negative for cough and chest tightness.   Gastrointestinal: Negative for abdominal pain and nausea.  Genitourinary: Negative for difficulty urinating, frequency and vaginal pain.  Musculoskeletal: Negative for back pain and gait problem.  Skin: Negative for pallor and rash.  Neurological: Negative for dizziness, tremors, weakness, numbness and headaches.  Psychiatric/Behavioral: Negative for confusion and sleep  disturbance. The patient is nervous/anxious.     Objective:  BP 120/70 (BP Location: Left Arm, Patient Position: Sitting, Cuff Size: Normal)   Pulse 79   Temp 98.3 F (36.8 C) (Oral)   Ht 5' 3.5" (1.613 m)   Wt 137 lb (62.1 kg)   SpO2 98%   BMI 23.89 kg/m   BP Readings from Last 3 Encounters:  08/17/19 120/70  07/25/19 138/82  06/21/19 133/78    Wt Readings from Last 3 Encounters:  08/17/19 137 lb (62.1 kg)  07/25/19 138 lb (62.6 kg)  06/21/19 136 lb (61.7 kg)    Physical Exam Constitutional:      General: She is not in acute distress.    Appearance: She is well-developed.  HENT:     Head: Normocephalic.     Right Ear:  External ear normal.     Left Ear: External ear normal.     Nose: Nose normal.  Eyes:     General:        Right eye: No discharge.        Left eye: No discharge.     Conjunctiva/sclera: Conjunctivae normal.     Pupils: Pupils are equal, round, and reactive to light.  Neck:     Thyroid: No thyromegaly.     Vascular: No JVD.     Trachea: No tracheal deviation.  Cardiovascular:     Rate and Rhythm: Normal rate and regular rhythm.     Heart sounds: Normal heart sounds.  Pulmonary:     Effort: No respiratory distress.     Breath sounds: No stridor. No wheezing.  Abdominal:     General: Bowel sounds are normal. There is no distension.     Palpations: Abdomen is soft. There is no mass.     Tenderness: There is no abdominal tenderness. There is no guarding or rebound.  Musculoskeletal:        General: No tenderness.     Cervical back: Normal range of motion and neck supple.  Lymphadenopathy:     Cervical: No cervical adenopathy.  Skin:    Findings: No erythema or rash.  Neurological:     Mental Status: She is oriented to person, place, and time.     Cranial Nerves: No cranial nerve deficit.     Motor: No abnormal muscle tone.     Coordination: Coordination normal.     Deep Tendon Reflexes: Reflexes normal.  Psychiatric:        Behavior: Behavior normal.        Thought Content: Thought content normal.        Judgment: Judgment normal.     Lab Results  Component Value Date   WBC 6.7 01/19/2019   HGB 13.7 01/19/2019   HCT 41.0 01/19/2019   PLT 269.0 01/19/2019   GLUCOSE 93 07/25/2019   CHOL 208 (H) 07/24/2019   TRIG 68.0 07/24/2019   HDL 71.40 07/24/2019   LDLDIRECT 148.2 02/03/2013   LDLCALC 123 (H) 07/24/2019   ALT 31 07/24/2019   AST 28 07/24/2019   NA 140 07/25/2019   K 3.6 07/25/2019   CL 104 07/25/2019   CREATININE 0.92 07/25/2019   BUN 17 07/25/2019   CO2 29 07/25/2019   TSH 0.79 01/19/2019   INR 1.0 10/28/2018   HGBA1C 5.4 07/24/2019   MICROALBUR 0.9  12/23/2017    US Renal  Result Date: 08/03/2019 CLINICAL DATA:  Renal cyst seen on recent MRI. EXAM: RENAL / URINARY TRACT ULTRASOUND COMPLETE COMPARISON:  MRI lumbar spine 07/12/2019 FINDINGS: Right Kidney: Renal measurements: 10.0 x 4.0 x 5.5 cm = volume: 116 mL . Echogenicity within normal limits. There is a cyst in the midportion of the right kidney measuring 3.7 x 4.3 x 4.3 cm. No hydronephrosis. Left Kidney: Renal measurements: 11.6 x 5.1 x 4.8 cm = volume: 149 mL. Echogenicity within normal limits. There is a cyst in the superior pole the left kidney measuring 2.0 x 1.8 x 2.1 cm. No hydronephrosis. Bladder: Appears normal for degree of bladder distention. Other: None. IMPRESSION: Bilateral renal cysts as above. Electronically Signed   By: Audie Pinto M.D.   On: 08/03/2019 13:25    Assessment & Plan:   There are no diagnoses linked to this encounter.   No orders of the defined types were placed in this encounter.    Follow-up: No follow-ups on file.  Walker Kehr, MD

## 2019-08-17 NOTE — Patient Instructions (Addendum)
Try Lion's Mashroom for memory. Stop Prevagen

## 2019-08-17 NOTE — Assessment & Plan Note (Signed)
Turmeric, Mg helped 

## 2019-08-17 NOTE — Assessment & Plan Note (Signed)
Doing well 

## 2019-08-17 NOTE — Assessment & Plan Note (Signed)
Off statin  

## 2019-08-22 ENCOUNTER — Telehealth: Payer: Self-pay | Admitting: Internal Medicine

## 2019-08-22 DIAGNOSIS — Z0184 Encounter for antibody response examination: Secondary | ICD-10-CM

## 2019-08-22 NOTE — Telephone Encounter (Signed)
New message:   Pt is calling and states she would like a order in to get her antibodies tested. Please advise the pt would like a call when the order has been placed.

## 2019-08-23 ENCOUNTER — Telehealth: Payer: Self-pay | Admitting: Internal Medicine

## 2019-08-23 NOTE — Telephone Encounter (Signed)
Notified pt MD ok labs. Made lab appt for 08/29/19.Tiffany KitchenJohny Gibbs

## 2019-08-23 NOTE — Telephone Encounter (Signed)
Okay.  It is ordered. Thanks

## 2019-08-28 NOTE — Telephone Encounter (Signed)
Pt returned call. Please call back when available. 

## 2019-08-28 NOTE — Telephone Encounter (Signed)
I called pt and relayed that referral placed. Give them a week to call and if need then call them yourself she has #.  She appreciated call back.

## 2019-08-28 NOTE — Addendum Note (Signed)
Addended by: Star Age on: 08/28/2019 09:55 AM   Modules accepted: Orders

## 2019-08-28 NOTE — Telephone Encounter (Signed)
I called pt and LMVM for her that Dr. Rexene Alberts did make referral for her to [facility] Dr. [place] Baseman or Dr. Wilburn Cornelia.  He verbalized understanding.

## 2019-08-28 NOTE — Telephone Encounter (Signed)
I placed a referral to ENT, Hinton Dyer, please process referral.  Lovey Newcomer, please notify patient that I have placed a referral for her to see Dr. Redmond Baseman or Dr. Wilburn Cornelia in consultation.

## 2019-08-29 ENCOUNTER — Other Ambulatory Visit: Payer: Self-pay

## 2019-08-29 ENCOUNTER — Other Ambulatory Visit: Payer: Medicare HMO

## 2019-08-29 DIAGNOSIS — Z0184 Encounter for antibody response examination: Secondary | ICD-10-CM | POA: Diagnosis not present

## 2019-08-29 DIAGNOSIS — E538 Deficiency of other specified B group vitamins: Secondary | ICD-10-CM

## 2019-08-29 DIAGNOSIS — E119 Type 2 diabetes mellitus without complications: Secondary | ICD-10-CM

## 2019-08-29 DIAGNOSIS — R252 Cramp and spasm: Secondary | ICD-10-CM

## 2019-08-29 NOTE — Telephone Encounter (Signed)
Sent referral to Dr. Elmyra Ricks maker or Dr. Redmond Baseman Fax number [phone]. Thanks Hinton Dyer

## 2019-08-29 NOTE — Telephone Encounter (Signed)
Left Patient a message.

## 2019-08-30 LAB — CBC WITH DIFFERENTIAL/PLATELET
Absolute Monocytes: 490 cells/uL (ref 200–950)
Basophils Absolute: 48 cells/uL (ref 0–200)
Basophils Relative: 0.7 %
Eosinophils Absolute: 83 cells/uL (ref 15–500)
Eosinophils Relative: 1.2 %
HCT: 39.8 % (ref 35.0–45.0)
Hemoglobin: 13.2 g/dL (ref 11.7–15.5)
Lymphs Abs: 1283 cells/uL (ref 850–3900)
MCH: 31.4 pg (ref 27.0–33.0)
MCHC: 33.2 g/dL (ref 32.0–36.0)
MCV: 94.8 fL (ref 80.0–100.0)
MPV: 11.7 fL (ref 7.5–12.5)
Monocytes Relative: 7.1 %
Neutro Abs: 4996 cells/uL (ref 1500–7800)
Neutrophils Relative %: 72.4 %
Platelets: 210 10*3/uL (ref 140–400)
RBC: 4.2 10*6/uL (ref 3.80–5.10)
RDW: 12 % (ref 11.0–15.0)
Total Lymphocyte: 18.6 %
WBC: 6.9 10*3/uL (ref 3.8–10.8)

## 2019-08-30 LAB — HEMOGLOBIN A1C
Hgb A1c MFr Bld: 5.2 % of total Hgb (ref <5.7)
Mean Plasma Glucose: 103 (calc)
eAG (mmol/L): 5.7 (calc)

## 2019-08-30 LAB — COMPLETE METABOLIC PANEL WITH GFR
AG Ratio: 1.9 (calc) (ref 1.0–2.5)
ALT: 21 U/L (ref 6–29)
AST: 20 U/L (ref 10–35)
Albumin: 4.2 g/dL (ref 3.6–5.1)
Alkaline phosphatase (APISO): 46 U/L (ref 37–153)
BUN/Creatinine Ratio: 17 (calc) (ref 6–22)
BUN: 17 mg/dL (ref 7–25)
CO2: 28 mmol/L (ref 20–32)
Calcium: 9.5 mg/dL (ref 8.6–10.4)
Chloride: 105 mmol/L (ref 98–110)
Creat: 0.99 mg/dL — ABNORMAL HIGH (ref 0.60–0.93)
GFR, Est African American: 65 mL/min/{1.73_m2} (ref >=60)
GFR, Est Non African American: 56 mL/min/{1.73_m2} — ABNORMAL LOW (ref >=60)
Globulin: 2.2 g/dL (calc) (ref 1.9–3.7)
Glucose, Bld: 90 mg/dL (ref 65–99)
Potassium: 4.1 mmol/L (ref 3.5–5.3)
Sodium: 141 mmol/L (ref 135–146)
Total Bilirubin: 0.5 mg/dL (ref 0.2–1.2)
Total Protein: 6.4 g/dL (ref 6.1–8.1)

## 2019-08-30 LAB — FERRITIN: Ferritin: 124 ng/mL (ref 16–288)

## 2019-08-30 LAB — VITAMIN B12: Vitamin B-12: >2000 pg/mL — ABNORMAL HIGH (ref 200–1100)

## 2019-08-30 LAB — SARS-COV-2 ANTIBODY(IGG)SPIKE,SEMI-QUANTITATIVE: SARS COV1 AB(IGG)SPIKE,SEMI QN: 8.05 index — ABNORMAL HIGH (ref <1.00)

## 2019-08-31 ENCOUNTER — Telehealth: Payer: Self-pay

## 2019-08-31 NOTE — Telephone Encounter (Signed)
New message    The patient is asking for lab results to be mailed to her also asking for the CMA to call her to discuss her lab experiences

## 2019-09-07 ENCOUNTER — Telehealth: Payer: Self-pay | Admitting: Internal Medicine

## 2019-09-07 DIAGNOSIS — G4733 Obstructive sleep apnea (adult) (pediatric): Secondary | ICD-10-CM | POA: Diagnosis not present

## 2019-09-07 NOTE — Telephone Encounter (Signed)
    Patient calling to report she got booster vaccine for Covid19 today at [facility]

## 2019-09-08 DIAGNOSIS — E119 Type 2 diabetes mellitus without complications: Secondary | ICD-10-CM | POA: Diagnosis not present

## 2019-09-08 DIAGNOSIS — Z7984 Long term (current) use of oral hypoglycemic drugs: Secondary | ICD-10-CM | POA: Diagnosis not present

## 2019-09-08 DIAGNOSIS — H18523 Epithelial (juvenile) corneal dystrophy, bilateral: Secondary | ICD-10-CM | POA: Diagnosis not present

## 2019-09-08 DIAGNOSIS — H52203 Unspecified astigmatism, bilateral: Secondary | ICD-10-CM | POA: Diagnosis not present

## 2019-09-08 DIAGNOSIS — D3131 Benign neoplasm of right choroid: Secondary | ICD-10-CM | POA: Diagnosis not present

## 2019-09-08 DIAGNOSIS — H5213 Myopia, bilateral: Secondary | ICD-10-CM | POA: Diagnosis not present

## 2019-09-08 DIAGNOSIS — H04123 Dry eye syndrome of bilateral lacrimal glands: Secondary | ICD-10-CM | POA: Diagnosis not present

## 2019-09-08 DIAGNOSIS — H43813 Vitreous degeneration, bilateral: Secondary | ICD-10-CM | POA: Diagnosis not present

## 2019-09-08 DIAGNOSIS — H35363 Drusen (degenerative) of macula, bilateral: Secondary | ICD-10-CM | POA: Diagnosis not present

## 2019-09-08 DIAGNOSIS — H524 Presbyopia: Secondary | ICD-10-CM | POA: Diagnosis not present

## 2019-09-08 NOTE — Telephone Encounter (Signed)
Called pt and updated her Covid vaccine in chart

## 2019-09-09 DIAGNOSIS — R69 Illness, unspecified: Secondary | ICD-10-CM | POA: Diagnosis not present

## 2019-09-12 ENCOUNTER — Ambulatory Visit: Payer: Self-pay

## 2019-09-12 NOTE — Telephone Encounter (Signed)
Labs mailed to pt and pt already aware of labs

## 2019-09-21 DIAGNOSIS — T466X5A Adverse effect of antihyperlipidemic and antiarteriosclerotic drugs, initial encounter: Secondary | ICD-10-CM | POA: Insufficient documentation

## 2019-09-21 DIAGNOSIS — G720 Drug-induced myopathy: Secondary | ICD-10-CM | POA: Insufficient documentation

## 2019-09-21 NOTE — Assessment & Plan Note (Signed)
Zetia was stopped

## 2019-09-22 ENCOUNTER — Telehealth: Payer: Self-pay | Admitting: Internal Medicine

## 2019-09-25 NOTE — Telephone Encounter (Signed)
    Patient wants to know why Vitamin discontinued

## 2019-09-26 DIAGNOSIS — G4733 Obstructive sleep apnea (adult) (pediatric): Secondary | ICD-10-CM | POA: Diagnosis not present

## 2019-09-28 NOTE — Telephone Encounter (Signed)
Pt states that she had a consultation with Dr. Wilburn Cornelia (phone number [phone]) about a new system for pts with sleep apnea as a replacement for the mask. Pt would like you to read about the system at [email or web address] and discuss with her wether you would suggest her switch to this method or continue with her mask. Pleas advise

## 2019-10-01 DIAGNOSIS — R69 Illness, unspecified: Secondary | ICD-10-CM | POA: Diagnosis not present

## 2019-10-05 ENCOUNTER — Other Ambulatory Visit: Payer: Self-pay | Admitting: Internal Medicine

## 2019-10-05 DIAGNOSIS — E119 Type 2 diabetes mellitus without complications: Secondary | ICD-10-CM

## 2019-10-05 DIAGNOSIS — F411 Generalized anxiety disorder: Secondary | ICD-10-CM

## 2019-10-05 DIAGNOSIS — I10 Essential (primary) hypertension: Secondary | ICD-10-CM

## 2019-10-05 DIAGNOSIS — Z0000 Encounter for general adult medical examination without abnormal findings: Secondary | ICD-10-CM

## 2019-10-05 DIAGNOSIS — E538 Deficiency of other specified B group vitamins: Secondary | ICD-10-CM

## 2019-10-05 DIAGNOSIS — R413 Other amnesia: Secondary | ICD-10-CM

## 2019-10-05 DIAGNOSIS — I8002 Phlebitis and thrombophlebitis of superficial vessels of left lower extremity: Secondary | ICD-10-CM

## 2019-10-05 DIAGNOSIS — J4520 Mild intermittent asthma, uncomplicated: Secondary | ICD-10-CM

## 2019-10-08 DIAGNOSIS — G4733 Obstructive sleep apnea (adult) (pediatric): Secondary | ICD-10-CM | POA: Diagnosis not present

## 2019-10-23 ENCOUNTER — Telehealth: Payer: Self-pay | Admitting: Internal Medicine

## 2019-10-23 DIAGNOSIS — E119 Type 2 diabetes mellitus without complications: Secondary | ICD-10-CM

## 2019-10-23 NOTE — Telephone Encounter (Signed)
Patient would like lab orders entered before her appointment on the 20th of October with Dr. Alain Marion  Patient would like a call to know when lab orders have been entered so she can go to [facility]

## 2019-10-24 NOTE — Telephone Encounter (Signed)
Will forward to MD desktop for him to review and order when he returns.Marland KitchenJohny Gibbs

## 2019-10-26 NOTE — Telephone Encounter (Signed)
Patient call back and has been informed of information below.

## 2019-10-29 ENCOUNTER — Other Ambulatory Visit: Payer: Self-pay | Admitting: Internal Medicine

## 2019-10-29 DIAGNOSIS — Z0000 Encounter for general adult medical examination without abnormal findings: Secondary | ICD-10-CM

## 2019-10-29 DIAGNOSIS — I8002 Phlebitis and thrombophlebitis of superficial vessels of left lower extremity: Secondary | ICD-10-CM

## 2019-10-29 DIAGNOSIS — R413 Other amnesia: Secondary | ICD-10-CM

## 2019-10-29 DIAGNOSIS — E119 Type 2 diabetes mellitus without complications: Secondary | ICD-10-CM

## 2019-10-29 DIAGNOSIS — E538 Deficiency of other specified B group vitamins: Secondary | ICD-10-CM

## 2019-10-29 DIAGNOSIS — J4520 Mild intermittent asthma, uncomplicated: Secondary | ICD-10-CM

## 2019-10-29 DIAGNOSIS — I10 Essential (primary) hypertension: Secondary | ICD-10-CM

## 2019-10-29 DIAGNOSIS — F411 Generalized anxiety disorder: Secondary | ICD-10-CM

## 2019-10-31 NOTE — Telephone Encounter (Signed)
Ok Thx 

## 2019-10-31 NOTE — Telephone Encounter (Signed)
I have no patients who use this device. I have no personal experience. She needs to discuss it with Dr. Wilburn Cornelia.

## 2019-10-31 NOTE — Telephone Encounter (Signed)
She has standing labs for A1c and bmet. Thanks

## 2019-10-31 NOTE — Telephone Encounter (Signed)
Per chart no standing orders are in the system will need to be re-entered. Pt will go to [facility] to have done.Marland KitchenJohny Gibbs

## 2019-11-01 ENCOUNTER — Ambulatory Visit: Payer: Medicare HMO | Admitting: Internal Medicine

## 2019-11-06 DIAGNOSIS — R69 Illness, unspecified: Secondary | ICD-10-CM | POA: Diagnosis not present

## 2019-11-08 ENCOUNTER — Encounter: Payer: Self-pay | Admitting: Sports Medicine

## 2019-11-08 ENCOUNTER — Ambulatory Visit (INDEPENDENT_AMBULATORY_CARE_PROVIDER_SITE_OTHER): Payer: Medicare HMO | Admitting: Sports Medicine

## 2019-11-08 ENCOUNTER — Other Ambulatory Visit: Payer: Self-pay

## 2019-11-08 ENCOUNTER — Telehealth: Payer: Self-pay | Admitting: Internal Medicine

## 2019-11-08 DIAGNOSIS — M79674 Pain in right toe(s): Secondary | ICD-10-CM | POA: Diagnosis not present

## 2019-11-08 DIAGNOSIS — B351 Tinea unguium: Secondary | ICD-10-CM | POA: Diagnosis not present

## 2019-11-08 DIAGNOSIS — G4733 Obstructive sleep apnea (adult) (pediatric): Secondary | ICD-10-CM | POA: Diagnosis not present

## 2019-11-08 DIAGNOSIS — E1142 Type 2 diabetes mellitus with diabetic polyneuropathy: Secondary | ICD-10-CM | POA: Diagnosis not present

## 2019-11-08 DIAGNOSIS — M79672 Pain in left foot: Secondary | ICD-10-CM

## 2019-11-08 DIAGNOSIS — M79671 Pain in right foot: Secondary | ICD-10-CM

## 2019-11-08 DIAGNOSIS — M79675 Pain in left toe(s): Secondary | ICD-10-CM

## 2019-11-08 NOTE — Progress Notes (Signed)
Subjective: Cynthya Yam is a 74 y.o. female patient with history of diabetes who returns to office today complaining of long,mildly painful nails  while ambulating in shoes; unable to trim..  Reports her glucose reading this morning was not recorded.  No other issues.   Patient Active Problem List   Diagnosis Date Noted  . 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 Grief 07/25/2019  . Renal cyst 07/25/2019  . Cystocele with prolapse 11/01/2018  . Cramp of limb 08/16/2018  . 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 Obstructive sleep apnea treated with continuous positive airway pressure (CPAP) 06/04/2017  . Degenerative arthritis of knee, bilateral 05/25/2017  . Left lumbar radiculopathy 04/27/2017  . Right knee pain 04/27/2017  . Tick bite 08/18/2016  . Drusen of macula of both eyes 05/04/2016  . Dry eyes, bilateral 05/04/2016  . Pseudophakia of both eyes 05/04/2016  . PVD (posterior vitreous detachment), both eyes 05/04/2016  . Dupuytren's disease of palm 04/01/2016  . Degenerative disc disease, lumbar 12/04/2015  . Elevated hemoglobin ([place]) 04/17/2015  . Occipital neuralgia of right side 04/17/2015  . Well adult exam 11/19/2014  . Asthmatic bronchitis 10/15/2014  . Neck pain on right side 10/15/2014  . Acute sinusitis 09/26/2014  . Dyslipidemia 06/18/2014  . Fracture of middle phalanx of finger 05/02/2014  . Hand pain, right 04/19/2014  . DM2 (diabetes mellitus, type 2) ([place]) 04/19/2014  . Hematoma of leg 12/15/2013  . Phlebitis of superficial vein of lower extremity 11/30/2013  . Otitis, externa, infective 11/30/2013  . Onychomycosis 08/30/2013  . Lower leg pain 05/10/2013  . Corn or callus 02/09/2013  . Acute posthemorrhagic anemia 01/19/2013  . Constipation 12/16/2012  . Left hip pain 12/16/2012  . Osteoarthritis of hip Left 11/27/2012  . Rash and nonspecific skin eruption 07/27/2012  . Cough 03/31/2012  . Diarrhea 02/05/2012  .  Microhematuria 09/09/2011  . Elevated LFTs 09/09/2011  . B12 deficiency 06/03/2011  . Cellulitis 03/10/2011  . Edema 03/10/2011  . Paresthesia of arm 02/25/2011  . Memory difficulty 11/03/2010  . Fibromyalgia syndrome 07/10/2010  . OSA (obstructive sleep apnea) 07/02/2010  . SIALADENITIS 11/25/2009  . TOBACCO USE, QUIT 04/04/2009  . ALLERGIC RHINITIS CAUSE UNSPECIFIED 10/04/2008  . URTICARIA 10/04/2008  . URINARY RETENTION 09/27/2008  . HIP PAIN 07/17/2008  . CERVICAL RADICULITIS 07/21/2007  . Headache 07/21/2007  . Osteoporosis 02/16/2007  . LOW BACK PAIN 12/27/2006  . Obesity 08/07/2006  . Anxiety 08/07/2006  . Depression 08/07/2006  . Hypertension, essential 08/07/2006  . OSTEOARTHRITIS 08/07/2006   Current Outpatient Medications on File Prior to Visit  Medication Sig Dispense Refill  . baclofen (LIORESAL) 10 MG tablet TAKE one - TWO TABLETS BY MOUTH DAILY AT BEDTIME AS NEEDED 180 tablet 1  . Biotin 10000 MCG TABS Take by mouth.    . Blood Glucose Monitoring Suppl (ONETOUCH VERIO) w/Device KIT Use to check blood sugar daily. DX E11.9 1 kit 0  . ciclopirox (PENLAC) 8 % solution APPLY OVER NAIL AND SURROUNDING SKIN DAILY AT BEDTIME. APPLY DAILY OVER PREVIOUS COAT. AFTER 7 DAYS MAY REMOVE WITH ALCOHOL AND CONTINUE CYCLE 6.6 mL 1  . Cyanocobalamin (B-12) 5000 MCG CAPS Take 1 tablet by mouth 3 (three) times a week.    . diphenhydrAMINE (BENADRYL ALLERGY) 25 mg capsule Take 25 mg by mouth daily as needed (allergies).     [name] escitalopram (LEXAPRO) 5 MG tablet Take 1 tablet (5 mg total) by mouth daily. 90 tablet 3  .  fluticasone (FLONASE) 50 MCG/ACT nasal spray Place 1 spray into both nostrils daily as needed for allergies or rhinitis.    [name] glucose blood (ONETOUCH VERIO) test strip Use to check blood sugar daily. DX E11.9 200 each 3  . LORazepam (ATIVAN) 1 MG tablet TAKE ONE TABLET BY MOUTH TWICE DAILY AS NEEDED FOR ANXIETY 60 tablet 3  . Magnesium 400 MG TABS Take 1 tablet by mouth  daily. 100 tablet 3  . magnesium oxide (MAG-OX) 400 MG tablet Take 400 mg by mouth daily.    . metFORMIN (GLUCOPHAGE) 500 MG tablet TAKE 1 TABLET BY MOUTH TWICE A DAY WITH A MEAL 180 tablet 2  . Omega-3 Fatty Acids (FISH OIL) 1200 MG CAPS Take 1 capsule by mouth in the morning and at bedtime.    [name] Lancets 10C MISC 1 each by Does not apply route in the morning and at bedtime. DX: E11.9 200 each 2  . phentermine (ADIPEX-P) 37.5 MG tablet TAKE ONE TABLET BY MOUTH ONE TIME DAILY before breakfast 30 tablet 1  . potassium chloride (KLOR-CON) 8 MEQ tablet TAKE ONE TABLET BY MOUTH TWICE DAILY  180 tablet 3  . Probiotic Product (DIGESTIVE ADVANTAGE GUMMIES PO) Take 1 each by mouth in the morning and at bedtime.    . Probiotic Product (PROBIOTIC PO) Take by mouth 2 (two) times daily.    . Turmeric Curcumin 500 MG CAPS Take 1 capsule by mouth in the morning and at bedtime.    [name] UNABLE TO FIND Med Name: Hyland's leg cramps     No current facility-administered medications on file prior to visit.   Allergies  Allergen Reactions  . Celecoxib     swell  . Enalapril     cough  . Gabapentin Other (See Comments)    dizziness  . Losartan     falls  . Lovastatin     REACTION: aches  . Lyrica [Pregabalin] Other (See Comments)    dizziness  . Naproxen   . Pravachol [Pravastatin Sodium]     Nausea   . Zetia [Ezetimibe]     Myalgias in LEs    Recent Results (from the past 2160 hour(s))  Ferritin     Status: None   Collection Time: 08/29/19  8:14 AM  Result Value Ref Range   Ferritin 124 16 - 288 ng/mL  COMPLETE METABOLIC PANEL WITH GFR     Status: Abnormal   Collection Time: 08/29/19  8:14 AM  Result Value Ref Range   Glucose, Bld 90 65 - 99 mg/dL    Comment: .            Fasting reference interval .    BUN 17 7 - 25 mg/dL   Creat 0.99 (H) 0.60 - 0.93 mg/dL    Comment: For patients >49 years of age, the reference limit for Creatinine is approximately 13% higher for  people identified as African-American. .    GFR, Est Non African American 56 (L) > OR = 60 mL/min/1.56m   GFR, Est African American 65 > OR = 60 mL/min/1.746m  BUN/Creatinine Ratio 17 6 - 22 (calc)   Sodium 141 135 - 146 mmol/L   Potassium 4.1 3.5 - 5.3 mmol/L   Chloride 105 98 - 110 mmol/L   CO2 28 20 - 32 mmol/L   Calcium 9.5 8.6 - 10.4 mg/dL   Total Protein 6.4 6.1 - 8.1 g/dL   Albumin 4.2 3.6 - 5.1 g/dL   Globulin 2.2  1.9 - 3.7 g/dL (calc)   AG Ratio 1.9 1.0 - 2.5 (calc)   Total Bilirubin 0.5 0.2 - 1.2 mg/dL   Alkaline phosphatase (APISO) 46 37 - 153 U/L   AST 20 10 - 35 U/L   ALT 21 6 - 29 U/L  Hemoglobin A1c     Status: None   Collection Time: 08/29/19  8:14 AM  Result Value Ref Range   Hgb A1c MFr Bld 5.2 <5.7 % of total Hgb    Comment: For the purpose of screening for the presence of diabetes: . <5.7%       Consistent with the absence of diabetes 5.7-6.4%    Consistent with increased risk for diabetes             (prediabetes) > or =6.5%  Consistent with diabetes . This assay result is consistent with a decreased risk of diabetes. . Currently, no consensus exists regarding use of hemoglobin A1c for diagnosis of diabetes in children. . According to American Diabetes Association (ADA) guidelines, hemoglobin A1c <7.0% represents optimal control in non-pregnant diabetic patients. Different metrics may apply to specific patient populations.  Standards of Medical Care in Diabetes(ADA). .    Mean Plasma Glucose 103 (calc)   eAG (mmol/L) 5.7 (calc)  CBC with Differential/Platelet     Status: None   Collection Time: 08/29/19  8:14 AM  Result Value Ref Range   WBC 6.9 3.8 - 10.8 Thousand/uL   RBC 4.20 3.80 - 5.10 Million/uL   Hemoglobin 13.2 11.7 - 15.5 g/dL   HCT 39.8 35 - 45 %   MCV 94.8 80.0 - 100.0 fL   MCH 31.4 27.0 - 33.0 pg   MCHC 33.2 32.0 - 36.0 g/dL   RDW 12.0 11.0 - 15.0 %   Platelets 210 140 - 400 Thousand/uL   MPV 11.7 7.5 - 12.5 fL   Neutro  Abs 4,996 1,500 - 7,800 cells/uL   Lymphs Abs 1,283 850 - 3,900 cells/uL   Absolute Monocytes 490 200 - 950 cells/uL   Eosinophils Absolute 83 15.0 - 500.0 cells/uL   Basophils Absolute 48 0.0 - 200.0 cells/uL   Neutrophils Relative % 72.4 %   Total Lymphocyte 18.6 %   Monocytes Relative 7.1 %   Eosinophils Relative 1.2 %   Basophils Relative 0.7 %  Vitamin B12     Status: Abnormal   Collection Time: 08/29/19  8:14 AM  Result Value Ref Range   Vitamin B-12 >2,000 (H) 200 - 1,100 pg/mL  SARS-CoV-2 Antibody(IgG)Spike,Semi-Quantitative     Status: Abnormal   Collection Time: 08/29/19  8:14 AM  Result Value Ref Range   SARS COV1 AB(IGG)SPIKE,SEMI QN 8.05 (H) <1.00 index    Comment: . Reference Range INDEX             INTERPRETATION <1.00             Negative > or = 1.00       Positive  . This test is intended to help identify individuals with antibodies to SARS-CoV-2 (COVID-19). The results of this semi-quantitative test should not be interpreted as an indication or degree of immunity or protection from reinfection. . A test result that is 1.00 or more (Positive) means antibodies to SARS-CoV-2 were detected in the blood sample by the test. This could mean that the individual may have an immune response to a recent or prior infection with SARS-CoV-2. Positive results may occur after COVID-19 vaccination, but the clinical significance of a positive antibody  result for individuals that have received a COVID-19 vaccine is unknown, and the performance of the test has not been established in COVID-19 vaccinees. False positive results for the test may occur due to cross- reactivity from pre-existing antibodies or other possible causes. . A test  result that is less than 1.00 (Negative) means that antibodies were not detected in the blood sample by the test. This could mean that the individual has not been previously infected with SARS-CoV-2. The clinical significance of a negative  antibody result for individuals that have received a COVID-19 vaccine is unknown. The performance of the test has not been established in COVID-19 vaccinees. False negative results for the test may occur if the individual's antibodies have not reached a sufficient level for the test to be able to detect them. Antibodies can take up to two to three weeks (sometimes longer) to develop after someone is infected. How long antibodies to SARS-CoV-2 last after infection is not known. . This test should not be used to diagnose an active SARS- CoV-2 infection. If an active infection is suspected, direct molecular or antigen testing for SARS-CoV-2 is recommended. . Please review the "Fact Sheets" available for healthcare providers and patie nts using the following websites: https://www.[email or web address]/home/Covid-19/HCP/antibody/ fact-sheet8 . https://[email or web address]/home/Covid-[email or web address]/ antibody/fact-sheet8 . Healthcare Providers: For additional information please refer to: http://education.[facility].com/faq/FAQ219 (This link is being provided for informational/ educational purposes only.) . This test has been authorized by the FDA under an Emergency Use Authorization (EUA) for use by authorized laboratories. The FDA authorized labeling is available on the [employer] website: www.[facility].com/Covid19.     Objective: General: Patient is awake, alert, and oriented x 3 and in no acute distress.  Integument: Skin is warm, dry and supple bilateral. Nails are tender, long, thickened and dystrophic with subungual debris, consistent with onychomycosis, 1-5 bilateral with bilateral hallux most involved improving in nature. No signs of infection. No open lesions or preulcerative lesions present bilateral. Remaining integument unremarkable.  Vasculature:  Dorsalis Pedis pulse 1/4 bilateral. Posterior Tibial pulse  1/4 bilateral.  Capillary fill time <3 sec  1-5 bilateral. Positive hair growth to the level of the digits. Temperature gradient within normal limits.  Minimal varicosities present bilateral. No edema present bilateral.   Neurology: The patient has intact sensation measured with a 5.07/10g Semmes Weinstein Monofilament at all pedal sites bilateral. Vibratory sensation diminished bilateral with tuning fork. No Babinski sign present bilateral.  Subjective cramping occasionally in both feet and lower extremities.  Musculoskeletal: Asymptomatic hammertoe pedal deformities noted bilateral. Muscular strength 5/5 in all lower extremity muscular groups bilateral without pain on range of motion . No tenderness with calf compression bilateral.  Assessment and Plan: Problem List Items Addressed This Visit    None    Visit Diagnoses    Pain due to onychomycosis of toenails of both feet    -  Primary   Diabetic polyneuropathy associated with type 2 diabetes mellitus (HCC)       Bilateral foot pain         -Examined patient. -Re-discussed  diabetic foot care  -Mechanically debrided all nails 1-5 bilateral using sterile nail nipper and filed with dremel without incident  -Continue with penlac or Tea tree oil to nails as previously instructed like before and advised patient that it will take time/even years sometimes to see improvement in nails -Answered all patient questions -Patient to return  in 3 months for at risk foot care  -Patient advised to call the office if any problems or  questions arise in the meantime.  Landis Martins, DPM

## 2019-11-08 NOTE — Telephone Encounter (Addendum)
Patient requesting medication  Patient requesting new order for ibuprofen (ADVIL,MOTRIN) 600 MG tablet  Be called to [place], [place]

## 2019-11-10 NOTE — Telephone Encounter (Signed)
It is best to avoid ibuprofen since it can affect kidney function negatively.  Use Tylenol as needed instead.  Thanks

## 2019-11-10 NOTE — Telephone Encounter (Signed)
Notified pt w/MD response.../lmb 

## 2019-11-14 ENCOUNTER — Other Ambulatory Visit (INDEPENDENT_AMBULATORY_CARE_PROVIDER_SITE_OTHER): Payer: Medicare HMO

## 2019-11-14 ENCOUNTER — Telehealth: Payer: Self-pay | Admitting: Internal Medicine

## 2019-11-14 DIAGNOSIS — E119 Type 2 diabetes mellitus without complications: Secondary | ICD-10-CM

## 2019-11-14 LAB — BASIC METABOLIC PANEL
BUN: 12 mg/dL (ref 6–23)
CO2: 27 mEq/L (ref 19–32)
Calcium: 9.3 mg/dL (ref 8.4–10.5)
Chloride: 106 mEq/L (ref 96–112)
Creatinine, Ser: 0.91 mg/dL (ref 0.40–1.20)
GFR: 62.21 mL/min (ref >60.00)
Glucose, Bld: 86 mg/dL (ref 70–99)
Potassium: 4 mEq/L (ref 3.5–5.1)
Sodium: 140 mEq/L (ref 135–145)

## 2019-11-14 LAB — HEMOGLOBIN A1C: Hgb A1c MFr Bld: 5.5 % (ref 4.6–6.5)

## 2019-11-14 NOTE — Telephone Encounter (Signed)
Patient called and said that she was at the dentist on 11.1.2021 and they called and told the patient that someone in the office had Covid and the is wondering what she needs to do. She said that she is scared to death and was wanting a call back in regards on what she needs to do.  Please call the patient at [phone]

## 2019-11-15 NOTE — Telephone Encounter (Signed)
She can call [phone] and make an appointment for COVID 19 community testing.   Thx

## 2019-11-15 NOTE — Telephone Encounter (Signed)
Notified pt w/MD response.../lmb 

## 2019-11-16 ENCOUNTER — Other Ambulatory Visit: Payer: Self-pay | Admitting: Internal Medicine

## 2019-11-16 ENCOUNTER — Other Ambulatory Visit: Payer: Medicare HMO

## 2019-11-17 ENCOUNTER — Other Ambulatory Visit: Payer: Self-pay | Admitting: Internal Medicine

## 2019-11-17 ENCOUNTER — Other Ambulatory Visit: Payer: Medicare HMO

## 2019-11-17 DIAGNOSIS — Z20822 Contact with and (suspected) exposure to covid-19: Secondary | ICD-10-CM | POA: Diagnosis not present

## 2019-11-18 LAB — SARS-COV-2, NAA 2 DAY TAT

## 2019-11-18 LAB — NOVEL CORONAVIRUS, NAA: SARS-CoV-2, NAA: NOT DETECTED

## 2019-11-20 ENCOUNTER — Ambulatory Visit (INDEPENDENT_AMBULATORY_CARE_PROVIDER_SITE_OTHER): Payer: Medicare HMO | Admitting: Internal Medicine

## 2019-11-20 ENCOUNTER — Encounter: Payer: Self-pay | Admitting: Internal Medicine

## 2019-11-20 ENCOUNTER — Other Ambulatory Visit: Payer: Self-pay

## 2019-11-20 VITALS — BP 120/72 | HR 79 | Temp 98.3°F | Wt 138.0 lb

## 2019-11-20 DIAGNOSIS — E538 Deficiency of other specified B group vitamins: Secondary | ICD-10-CM | POA: Diagnosis not present

## 2019-11-20 DIAGNOSIS — F4321 Adjustment disorder with depressed mood: Secondary | ICD-10-CM

## 2019-11-20 DIAGNOSIS — F3340 Major depressive disorder, recurrent, in remission, unspecified: Secondary | ICD-10-CM | POA: Diagnosis not present

## 2019-11-20 DIAGNOSIS — R69 Illness, unspecified: Secondary | ICD-10-CM | POA: Diagnosis not present

## 2019-11-20 DIAGNOSIS — R413 Other amnesia: Secondary | ICD-10-CM | POA: Diagnosis not present

## 2019-11-20 DIAGNOSIS — Z23 Encounter for immunization: Secondary | ICD-10-CM | POA: Diagnosis not present

## 2019-11-20 MED ORDER — DIAZEPAM 5 MG PO TABS
5.0000 mg | ORAL_TABLET | Freq: Two times a day (BID) | ORAL | 5 refills | Status: DC | PRN
Start: 2019-11-20 — End: 2020-02-19

## 2019-11-20 MED ORDER — VENLAFAXINE HCL ER 37.5 MG PO CP24: 37.5000 mg | ORAL_CAPSULE | Freq: Every day | ORAL | 5 refills | Status: DC

## 2019-11-20 NOTE — Assessment & Plan Note (Signed)
Brother died in May 2021  - he was on HD and stopped, grieving

## 2019-11-20 NOTE — Assessment & Plan Note (Signed)
[facility] for memory 

## 2019-11-20 NOTE — Progress Notes (Signed)
Subjective:  Patient ID: Tiffany Gibbs, female    DOB: 24-Jan-1945  Age: 74 y.o. MRN: [id number]  CC: Follow-up (3 month F/U)   HPI Darleene Cumpian presents for c/o depression - her brother died in 06-08-2019 - he was on HD and stopped, grieving Meds don't work - tearful, depressed, not sleeping  Outpatient Medications Prior to Visit  Medication Sig Dispense Refill  . Biotin 10000 MCG TABS Take by mouth.    . Blood Glucose Monitoring Suppl (ONETOUCH VERIO) w/Device KIT Use to check blood sugar daily. DX E11.9 1 kit 0  . ciclopirox (PENLAC) 8 % solution APPLY OVER NAIL AND SURROUNDING SKIN AT BEDTIME OVER PREVIOUS COAT. AFTER 7 DAYS, MAY REMOVE WITH ALCOHOL AND CONTINUE CYCLE. 6.6 mL 0  . Cyanocobalamin (B-12) 5000 MCG CAPS Take 1 tablet by mouth 3 (three) times a week.    . diphenhydrAMINE (BENADRYL ALLERGY) 25 mg capsule Take 25 mg by mouth daily as needed (allergies).     [name] escitalopram (LEXAPRO) 5 MG tablet TAKE ONE TABLET BY MOUTH ONE TIME DAILY 90 tablet 1  . fluticasone (FLONASE) 50 MCG/ACT nasal spray Place 1 spray into both nostrils daily as needed for allergies or rhinitis.    [name] glucose blood (ONETOUCH VERIO) test strip Use to check blood sugar daily. DX E11.9 200 each 3  . LORazepam (ATIVAN) 1 MG tablet TAKE ONE TABLET BY MOUTH TWICE A DAY AS NEEDED FOR ANXIETY 60 tablet 1  . Magnesium 400 MG TABS Take 1 tablet by mouth daily. 100 tablet 3  . magnesium oxide (MAG-OX) 400 MG tablet Take 400 mg by mouth daily.    . metFORMIN (GLUCOPHAGE) 500 MG tablet TAKE 1 TABLET BY MOUTH TWICE A DAY WITH A MEAL 180 tablet 2  . Omega-3 Fatty Acids (FISH OIL) 1200 MG CAPS Take 1 capsule by mouth in the morning and at bedtime.    [name] Lancets 99B MISC 1 each by Does not apply route in the morning and at bedtime. DX: E11.9 200 each 2  . potassium chloride (KLOR-CON) 8 MEQ tablet TAKE ONE TABLET BY MOUTH TWICE DAILY  180 tablet 3  . Probiotic Product (DIGESTIVE ADVANTAGE  GUMMIES PO) Take 1 each by mouth in the morning and at bedtime.    . Probiotic Product (PROBIOTIC PO) Take by mouth 2 (two) times daily.    . Turmeric Curcumin 500 MG CAPS Take 1 capsule by mouth in the morning and at bedtime.    [name] UNABLE TO FIND Med Name: Hyland's leg cramps    . baclofen (LIORESAL) 10 MG tablet TAKE one - TWO TABLETS BY MOUTH DAILY AT BEDTIME AS NEEDED 180 tablet 1  . phentermine (ADIPEX-P) 37.5 MG tablet TAKE ONE TABLET BY MOUTH ONE TIME DAILY before breakfast 30 tablet 1   No facility-administered medications prior to visit.    ROS: Review of Systems  Constitutional: Negative for activity change, appetite change, chills, fatigue and unexpected weight change.  HENT: Negative for congestion, mouth sores and sinus pressure.   Eyes: Negative for visual disturbance.  Respiratory: Negative for cough and chest tightness.   Gastrointestinal: Negative for abdominal pain and nausea.  Genitourinary: Negative for difficulty urinating, frequency and vaginal pain.  Musculoskeletal: Negative for back pain and gait problem.  Skin: Negative for pallor and rash.  Neurological: Negative for dizziness, tremors, weakness, numbness and headaches.  Psychiatric/Behavioral: Positive for dysphoric mood and sleep disturbance. Negative for confusion and suicidal ideas. The patient  is nervous/anxious.     Objective:  BP 120/72 (BP Location: Left Arm)   Pulse 79   Temp 98.3 F (36.8 C) (Oral)   Wt 138 lb (62.6 kg)   SpO2 98%   BMI 24.06 kg/m   BP Readings from Last 3 Encounters:  11/20/19 120/72  08/17/19 120/70  07/25/19 138/82    Wt Readings from Last 3 Encounters:  11/20/19 138 lb (62.6 kg)  08/17/19 137 lb (62.1 kg)  07/25/19 138 lb (62.6 kg)    Physical Exam Constitutional:      General: She is not in acute distress.    Appearance: Normal appearance. She is well-developed.  HENT:     Head: Normocephalic.     Right Ear: External ear normal.     Left Ear: External ear  normal.     Nose: Nose normal.  Eyes:     General:        Right eye: No discharge.        Left eye: No discharge.     Conjunctiva/sclera: Conjunctivae normal.     Pupils: Pupils are equal, round, and reactive to light.  Neck:     Thyroid: No thyromegaly.     Vascular: No JVD.     Trachea: No tracheal deviation.  Cardiovascular:     Rate and Rhythm: Normal rate and regular rhythm.     Heart sounds: Normal heart sounds.  Pulmonary:     Effort: No respiratory distress.     Breath sounds: No stridor. No wheezing.  Abdominal:     General: Bowel sounds are normal. There is no distension.     Palpations: Abdomen is soft. There is no mass.     Tenderness: There is no abdominal tenderness. There is no guarding or rebound.  Musculoskeletal:        General: No tenderness.     Cervical back: Normal range of motion and neck supple.  Lymphadenopathy:     Cervical: No cervical adenopathy.  Skin:    Findings: No erythema or rash.  Neurological:     Mental Status: She is oriented to person, place, and time.     Cranial Nerves: No cranial nerve deficit.     Motor: No abnormal muscle tone.     Coordination: Coordination normal.     Deep Tendon Reflexes: Reflexes normal.  Psychiatric:        Behavior: Behavior normal.        Thought Content: Thought content normal.        Judgment: Judgment normal.     Lab Results  Component Value Date   WBC 6.9 08/29/2019   HGB 13.2 08/29/2019   HCT 39.8 08/29/2019   PLT 210 08/29/2019   GLUCOSE 86 11/14/2019   CHOL 208 (H) 07/24/2019   TRIG 68.0 07/24/2019   HDL 71.40 07/24/2019   LDLDIRECT 148.2 02/03/2013   LDLCALC 123 (H) 07/24/2019   ALT 21 08/29/2019   AST 20 08/29/2019   NA 140 11/14/2019   K 4.0 11/14/2019   CL 106 11/14/2019   CREATININE 0.91 11/14/2019   BUN 12 11/14/2019   CO2 27 11/14/2019   TSH 0.79 01/19/2019   INR 1.0 10/28/2018   HGBA1C 5.5 11/14/2019   MICROALBUR 0.9 12/23/2017    US Renal  Result Date:  08/03/2019 CLINICAL DATA:  Renal cyst seen on recent MRI. EXAM: RENAL / URINARY TRACT ULTRASOUND COMPLETE COMPARISON:  MRI lumbar spine 07/12/2019 FINDINGS: Right Kidney: Renal measurements: 10.0 x 4.0 x 5.5  cm = volume: 116 mL . Echogenicity within normal limits. There is a cyst in the midportion of the right kidney measuring 3.7 x 4.3 x 4.3 cm. No hydronephrosis. Left Kidney: Renal measurements: 11.6 x 5.1 x 4.8 cm = volume: 149 mL. Echogenicity within normal limits. There is a cyst in the superior pole the left kidney measuring 2.0 x 1.8 x 2.1 cm. No hydronephrosis. Bladder: Appears normal for degree of bladder distention. Other: None. IMPRESSION: Bilateral renal cysts as above. Electronically Signed   By: Audie Pinto M.D.   On: 08/03/2019 13:25    Assessment & Plan:    Walker Kehr, MD

## 2019-11-20 NOTE — Assessment & Plan Note (Signed)
On B12 

## 2019-11-21 ENCOUNTER — Telehealth: Payer: Self-pay | Admitting: Internal Medicine

## 2019-11-21 NOTE — Telephone Encounter (Signed)
Patient saw on her paper work she needed to take B12 once a day instead of once a week so she was wondering what her last B12 was and to make sure she needed to take it once a week.

## 2019-11-22 NOTE — Telephone Encounter (Signed)
Called pt there was no answer LMOm w/MD response.Marland KitchenJohny Gibbs

## 2019-11-22 NOTE — Telephone Encounter (Signed)
For now once a week is correct. Her levels of vitamin B12 were high last time. Thanks

## 2019-11-22 NOTE — Telephone Encounter (Signed)
Patient called and said thank you for calling her and leaving her a voicemail. She said she will do the once a week for her B12.

## 2019-11-27 ENCOUNTER — Telehealth: Payer: Self-pay | Admitting: Adult Health

## 2019-11-27 DIAGNOSIS — Z1231 Encounter for screening mammogram for malignant neoplasm of breast: Secondary | ICD-10-CM | POA: Diagnosis not present

## 2019-11-27 NOTE — Telephone Encounter (Signed)
Pt called, interested in purchasing so clean for my CPAP machine. Want to know if I should invest in one. Would like a call from the nurse.

## 2019-11-27 NOTE — Telephone Encounter (Signed)
Called and LMVM for her to return call ( that soclean ok if not recalled phillips machines.  Recommend using baby, dish washing soap once weekly.  Some insurances my not agree to pay for repair of machines if using this cleaner).

## 2019-11-27 NOTE — Telephone Encounter (Signed)
Pt returned phone call.  

## 2019-11-28 NOTE — Telephone Encounter (Signed)
Pt callled relayed telephone message. Pt said thank you.

## 2019-12-06 DIAGNOSIS — R69 Illness, unspecified: Secondary | ICD-10-CM | POA: Diagnosis not present

## 2019-12-08 DIAGNOSIS — G4733 Obstructive sleep apnea (adult) (pediatric): Secondary | ICD-10-CM | POA: Diagnosis not present

## 2019-12-12 ENCOUNTER — Telehealth: Payer: Self-pay | Admitting: Internal Medicine

## 2019-12-12 DIAGNOSIS — E119 Type 2 diabetes mellitus without complications: Secondary | ICD-10-CM

## 2019-12-12 NOTE — Telephone Encounter (Signed)
Patient wondering if she needs to get a urine test before her appointment on 12.06.21 to check about the protein in her urine.  [phone]

## 2019-12-14 ENCOUNTER — Other Ambulatory Visit (INDEPENDENT_AMBULATORY_CARE_PROVIDER_SITE_OTHER): Payer: Medicare HMO

## 2019-12-14 DIAGNOSIS — E119 Type 2 diabetes mellitus without complications: Secondary | ICD-10-CM | POA: Diagnosis not present

## 2019-12-14 DIAGNOSIS — D485 Neoplasm of uncertain behavior of skin: Secondary | ICD-10-CM | POA: Diagnosis not present

## 2019-12-14 LAB — URINALYSIS, ROUTINE W REFLEX MICROSCOPIC
Bilirubin Urine: NEGATIVE
Ketones, ur: NEGATIVE
Leukocytes,Ua: NEGATIVE
Nitrite: NEGATIVE
Specific Gravity, Urine: 1.015 (ref 1.000–1.030)
Total Protein, Urine: NEGATIVE
Urine Glucose: NEGATIVE
Urobilinogen, UA: 0.2 (ref 0.0–1.0)
pH: 7 (ref 5.0–8.0)

## 2019-12-14 LAB — MICROALBUMIN / CREATININE URINE RATIO
Creatinine,U: 49.6 mg/dL
Microalb Creat Ratio: 1.4 mg/g (ref 0.0–30.0)
Microalb, Ur: <0.7 mg/dL (ref 0.0–1.9)

## 2019-12-14 NOTE — Telephone Encounter (Signed)
Called pt there was no answer LMOM MD ok UA she cn go to the [facility] to have done 2-3 days prior to appt.Marland KitchenJohny Chess

## 2019-12-14 NOTE — Telephone Encounter (Signed)
Okay.  Thanks.

## 2019-12-18 ENCOUNTER — Other Ambulatory Visit: Payer: Self-pay

## 2019-12-18 ENCOUNTER — Ambulatory Visit (INDEPENDENT_AMBULATORY_CARE_PROVIDER_SITE_OTHER): Payer: Medicare HMO | Admitting: Internal Medicine

## 2019-12-18 ENCOUNTER — Encounter: Payer: Self-pay | Admitting: Internal Medicine

## 2019-12-18 DIAGNOSIS — F3340 Major depressive disorder, recurrent, in remission, unspecified: Secondary | ICD-10-CM

## 2019-12-18 DIAGNOSIS — E538 Deficiency of other specified B group vitamins: Secondary | ICD-10-CM | POA: Diagnosis not present

## 2019-12-18 DIAGNOSIS — E119 Type 2 diabetes mellitus without complications: Secondary | ICD-10-CM | POA: Diagnosis not present

## 2019-12-18 DIAGNOSIS — N281 Cyst of kidney, acquired: Secondary | ICD-10-CM

## 2019-12-18 DIAGNOSIS — I10 Essential (primary) hypertension: Secondary | ICD-10-CM

## 2019-12-18 DIAGNOSIS — R252 Cramp and spasm: Secondary | ICD-10-CM

## 2019-12-18 DIAGNOSIS — R69 Illness, unspecified: Secondary | ICD-10-CM | POA: Diagnosis not present

## 2019-12-18 DIAGNOSIS — R413 Other amnesia: Secondary | ICD-10-CM | POA: Diagnosis not present

## 2019-12-18 MED ORDER — LORATADINE 10 MG PO TABS: 10.0000 mg | ORAL_TABLET | Freq: Every day | ORAL | 11 refills | Status: AC

## 2019-12-18 MED ORDER — FLUTICASONE PROPIONATE 50 MCG/ACT NA SUSP: 2.0000 | Freq: Every day | NASAL | 6 refills | Status: AC

## 2019-12-18 NOTE — Assessment & Plan Note (Signed)
Better on Effexor °

## 2019-12-18 NOTE — Assessment & Plan Note (Signed)
Better Treat depression

## 2019-12-18 NOTE — Assessment & Plan Note (Signed)
On B12 

## 2019-12-18 NOTE — Assessment & Plan Note (Signed)
Better on Diazepam prn  Potential benefits of a long term benzodiazepines  use as well as potential risks  and complications were explained to the patient and were aknowledged.

## 2019-12-18 NOTE — Assessment & Plan Note (Signed)
Wt Readings from Last 3 Encounters:  12/18/19 139 lb (63 kg)  11/20/19 138 lb (62.6 kg)  08/17/19 137 lb (62.1 kg)

## 2019-12-18 NOTE — Progress Notes (Signed)
Subjective:  Patient ID: Tiffany Gibbs, female    DOB: 1945/05/16  Age: 74 y.o. MRN: [id number]  CC: Follow-up (4 week f/u)   HPI Tiffany Gibbs presents for cramps - better on Diazepam, depression - better on Effexor XR F/u CRI, diabetes   Outpatient Medications Prior to Visit  Medication Sig Dispense Refill  . Biotin 10000 MCG TABS Take by mouth.    . Blood Glucose Monitoring Suppl (ONETOUCH VERIO) w/Device KIT Use to check blood sugar daily. DX E11.9 1 kit 0  . ciclopirox (PENLAC) 8 % solution APPLY OVER NAIL AND SURROUNDING SKIN AT BEDTIME OVER PREVIOUS COAT. AFTER 7 DAYS, MAY REMOVE WITH ALCOHOL AND CONTINUE CYCLE. 6.6 mL 0  . Cyanocobalamin (B-12) 5000 MCG CAPS Take 1 tablet by mouth once a week.     . diazepam (VALIUM) 5 MG tablet Take 1-2 tablets (5-10 mg total) by mouth every 12 (twelve) hours as needed for anxiety or muscle spasms. 30 tablet 5  . diphenhydrAMINE (BENADRYL ALLERGY) 25 mg capsule Take 25 mg by mouth daily as needed (allergies).     . fluticasone (FLONASE) 50 MCG/ACT nasal spray Place 1 spray into both nostrils daily as needed for allergies or rhinitis.    [name] glucose blood (ONETOUCH VERIO) test strip Use to check blood sugar daily. DX E11.9 200 each 3  . LORazepam (ATIVAN) 1 MG tablet Take 1 mg by mouth 2 (two) times daily.    . Magnesium 400 MG TABS Take 1 tablet by mouth daily. 100 tablet 3  . magnesium oxide (MAG-OX) 400 MG tablet Take 400 mg by mouth daily.    . metFORMIN (GLUCOPHAGE) 500 MG tablet TAKE 1 TABLET BY MOUTH TWICE A DAY WITH A MEAL 180 tablet 2  . Omega-3 Fatty Acids (FISH OIL) 1200 MG CAPS Take 1 capsule by mouth in the morning and at bedtime.    [name] Lancets 74G MISC 1 each by Does not apply route in the morning and at bedtime. DX: E11.9 200 each 2  . potassium chloride (KLOR-CON) 8 MEQ tablet TAKE ONE TABLET BY MOUTH TWICE DAILY  180 tablet 3  . Probiotic Product (DIGESTIVE ADVANTAGE GUMMIES PO) Take 1 each by  mouth in the morning and at bedtime.    . Probiotic Product (PROBIOTIC PO) Take by mouth 2 (two) times daily.    . Turmeric Curcumin 500 MG CAPS Take 1 capsule in the morning and 2 at bedtime    . venlafaxine XR (EFFEXOR XR) 37.5 MG 24 hr capsule Take 1 capsule (37.5 mg total) by mouth daily with breakfast. 30 capsule 5   No facility-administered medications prior to visit.    ROS: Review of Systems  Constitutional: Negative for activity change, appetite change, chills, fatigue and unexpected weight change.  HENT: Negative for congestion, mouth sores and sinus pressure.   Eyes: Negative for visual disturbance.  Respiratory: Negative for cough and chest tightness.   Gastrointestinal: Negative for abdominal pain and nausea.  Genitourinary: Negative for difficulty urinating, frequency and vaginal pain.  Musculoskeletal: Negative for back pain and gait problem.  Skin: Negative for pallor and rash.  Neurological: Negative for dizziness, tremors, weakness, numbness and headaches.  Psychiatric/Behavioral: Negative for confusion, dysphoric mood and sleep disturbance. The patient is not nervous/anxious.     Objective:  BP 112/78 (BP Location: Left Arm)   Pulse 74   Temp 98.6 F (37 C) (Oral)   Wt 139 lb (63 kg)   SpO2 94%  BMI 24.24 kg/m   BP Readings from Last 3 Encounters:  12/18/19 112/78  11/20/19 120/72  08/17/19 120/70    Wt Readings from Last 3 Encounters:  12/18/19 139 lb (63 kg)  11/20/19 138 lb (62.6 kg)  08/17/19 137 lb (62.1 kg)    Physical Exam Constitutional:      General: She is not in acute distress.    Appearance: She is well-developed.  HENT:     Head: Normocephalic.     Right Ear: External ear normal.     Left Ear: External ear normal.     Nose: Nose normal.  Eyes:     General:        Right eye: No discharge.        Left eye: No discharge.     Conjunctiva/sclera: Conjunctivae normal.     Pupils: Pupils are equal, round, and reactive to light.   Neck:     Thyroid: No thyromegaly.     Vascular: No JVD.     Trachea: No tracheal deviation.  Cardiovascular:     Rate and Rhythm: Normal rate and regular rhythm.     Heart sounds: Normal heart sounds.  Pulmonary:     Effort: No respiratory distress.     Breath sounds: No stridor. No wheezing.  Abdominal:     General: Bowel sounds are normal. There is no distension.     Palpations: Abdomen is soft. There is no mass.     Tenderness: There is no abdominal tenderness. There is no guarding or rebound.  Musculoskeletal:        General: No tenderness.     Cervical back: Normal range of motion and neck supple.  Lymphadenopathy:     Cervical: No cervical adenopathy.  Skin:    Findings: No erythema or rash.  Neurological:     Mental Status: She is oriented to person, place, and time.     Cranial Nerves: No cranial nerve deficit.     Motor: No abnormal muscle tone.     Coordination: Coordination normal.     Deep Tendon Reflexes: Reflexes normal.  Psychiatric:        Behavior: Behavior normal.        Thought Content: Thought content normal.        Judgment: Judgment normal.     Lab Results  Component Value Date   WBC 6.9 08/29/2019   HGB 13.2 08/29/2019   HCT 39.8 08/29/2019   PLT 210 08/29/2019   GLUCOSE 86 11/14/2019   CHOL 208 (H) 07/24/2019   TRIG 68.0 07/24/2019   HDL 71.40 07/24/2019   LDLDIRECT 148.2 02/03/2013   LDLCALC 123 (H) 07/24/2019   ALT 21 08/29/2019   AST 20 08/29/2019   NA 140 11/14/2019   K 4.0 11/14/2019   CL 106 11/14/2019   CREATININE 0.91 11/14/2019   BUN 12 11/14/2019   CO2 27 11/14/2019   TSH 0.79 01/19/2019   INR 1.0 10/28/2018   HGBA1C 5.5 11/14/2019   MICROALBUR <0.7 12/14/2019    US Renal  Result Date: 08/03/2019 CLINICAL DATA:  Renal cyst seen on recent MRI. EXAM: RENAL / URINARY TRACT ULTRASOUND COMPLETE COMPARISON:  MRI lumbar spine 07/12/2019 FINDINGS: Right Kidney: Renal measurements: 10.0 x 4.0 x 5.5 cm = volume: 116 mL .  Echogenicity within normal limits. There is a cyst in the midportion of the right kidney measuring 3.7 x 4.3 x 4.3 cm. No hydronephrosis. Left Kidney: Renal measurements: 11.6 x 5.1 x 4.8 cm =  volume: 149 mL. Echogenicity within normal limits. There is a cyst in the superior pole the left kidney measuring 2.0 x 1.8 x 2.1 cm. No hydronephrosis. Bladder: Appears normal for degree of bladder distention. Other: None. IMPRESSION: Bilateral renal cysts as above. Electronically Signed   By: Audie Pinto M.D.   On: 08/03/2019 13:25    Assessment & Plan:    Walker Kehr, MD

## 2019-12-18 NOTE — Assessment & Plan Note (Signed)
B cysts on [place] - repeat US in 7/22

## 2019-12-18 NOTE — Assessment & Plan Note (Signed)
labs

## 2019-12-19 ENCOUNTER — Telehealth: Payer: Self-pay | Admitting: Internal Medicine

## 2019-12-19 NOTE — Telephone Encounter (Signed)
    Patient requesting the date on order for renal scan be extended. She plans to have scan done 08/01/20

## 2019-12-20 NOTE — Telephone Encounter (Signed)
Ok Thx 

## 2020-01-06 ENCOUNTER — Emergency Department (HOSPITAL_BASED_OUTPATIENT_CLINIC_OR_DEPARTMENT_OTHER)
Admission: EM | Admit: 2020-01-06 | Discharge: 2020-01-06 | Disposition: A | Discharge status: Home / Self Care | Payer: Medicare HMO | Attending: Emergency Medicine | Admitting: Emergency Medicine

## 2020-01-06 ENCOUNTER — Encounter (HOSPITAL_BASED_OUTPATIENT_CLINIC_OR_DEPARTMENT_OTHER): Payer: Self-pay | Admitting: Emergency Medicine

## 2020-01-06 ENCOUNTER — Other Ambulatory Visit: Payer: Self-pay

## 2020-01-06 ENCOUNTER — Emergency Department (HOSPITAL_BASED_OUTPATIENT_CLINIC_OR_DEPARTMENT_OTHER): Payer: Medicare HMO

## 2020-01-06 DIAGNOSIS — M79662 Pain in left lower leg: Secondary | ICD-10-CM | POA: Insufficient documentation

## 2020-01-06 DIAGNOSIS — Z87891 Personal history of nicotine dependence: Secondary | ICD-10-CM | POA: Insufficient documentation

## 2020-01-06 DIAGNOSIS — M79605 Pain in left leg: Secondary | ICD-10-CM

## 2020-01-06 DIAGNOSIS — Z7984 Long term (current) use of oral hypoglycemic drugs: Secondary | ICD-10-CM | POA: Diagnosis not present

## 2020-01-06 DIAGNOSIS — E119 Type 2 diabetes mellitus without complications: Secondary | ICD-10-CM | POA: Diagnosis not present

## 2020-01-06 DIAGNOSIS — I10 Essential (primary) hypertension: Secondary | ICD-10-CM | POA: Insufficient documentation

## 2020-01-06 LAB — BASIC METABOLIC PANEL
Anion gap: 9 (ref 5–15)
BUN: 16 mg/dL (ref 8–23)
CO2: 28 mmol/L (ref 22–32)
Calcium: 9.3 mg/dL (ref 8.9–10.3)
Chloride: 101 mmol/L (ref 98–111)
Creatinine, Ser: 0.94 mg/dL (ref 0.44–1.00)
GFR, Estimated: >60 mL/min (ref >60)
Glucose, Bld: 97 mg/dL (ref 70–99)
Potassium: 4 mmol/L (ref 3.5–5.1)
Sodium: 138 mmol/L (ref 135–145)

## 2020-01-06 LAB — CBC
HCT: 41.5 % (ref 36.0–46.0)
Hemoglobin: 13.8 g/dL (ref 12.0–15.0)
MCH: 30.4 pg (ref 26.0–34.0)
MCHC: 33.3 g/dL (ref 30.0–36.0)
MCV: 91.4 fL (ref 80.0–100.0)
Platelets: 215 10*3/uL (ref 150–400)
RBC: 4.54 MIL/uL (ref 3.87–5.11)
RDW: 12.4 % (ref 11.5–15.5)
WBC: 5.9 10*3/uL (ref 4.0–10.5)
nRBC: 0 % (ref 0.0–0.2)

## 2020-01-06 NOTE — ED Provider Notes (Signed)
[facility] EMERGENCY DEPARTMENT Provider Note   CSN: [id number] Arrival date & time: 01/06/20  1129     History Chief Complaint  Patient presents with  . Leg Pain    Tiffany Gibbs is a 74 y.o. female past medical history of depression, diabetes, fibromyalgia, hypertension, hyperlipidemia, OSA, osteoarthritis that presents the emergency department today for left lower extremity calf pain for the past 2 weeks.  Patient denies any trauma to the area. Patient states that it feels like a cramping sensation to the back of her calf, states that she normally gets some mild cramps every now and then to her bilateral legs however has not lingered this long.  Patient states that her mom died in her sleep of a blood clot therefore was concerned and came to the emergency department.  Denies any redness, swelling, or discoloration of her calf or leg.  Denies any fevers, chills nausea or vomiting.  Denies any chest pain or shortness of breath.  Has never had a clotting disorder before, no recent surgeries, no history of cancer.  Denies any numbness or tingling.  No recent travel.  Not on any blood thinners.  HPI     Past Medical History:  Diagnosis Date  . Anxiety   . Cataract   . Depression   . DM type 2 (diabetes mellitus, type 2) ([place])   . Fibromyalgia   . H/O dizziness   . Hives    from tomatoes  . HTN (hypertension)   . Hyperlipidemia   . Kidney disease   . Low back pain   . OSA (obstructive sleep apnea)    uses c-pap  . Osteoarthritis   . Osteoporosis   . SOB (shortness of breath) on exertion     Patient Active Problem List   Diagnosis Date Noted  . 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 Grief 07/25/2019  . Renal cyst 07/25/2019  . Cystocele with prolapse 11/01/2018  . Cramp of limb 08/16/2018  . 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 Obstructive sleep apnea treated with continuous positive airway pressure (CPAP) 06/04/2017   . Degenerative arthritis of knee, bilateral 05/25/2017  . Left lumbar radiculopathy 04/27/2017  . Right knee pain 04/27/2017  . Tick bite 08/18/2016  . Drusen of macula of both eyes 05/04/2016  . Dry eyes, bilateral 05/04/2016  . Pseudophakia of both eyes 05/04/2016  . PVD (posterior vitreous detachment), both eyes 05/04/2016  . Dupuytren's disease of palm 04/01/2016  . Degenerative disc disease, lumbar 12/04/2015  . Elevated hemoglobin ([place]) 04/17/2015  . Occipital neuralgia of right side 04/17/2015  . Well adult exam 11/19/2014  . Asthmatic bronchitis 10/15/2014  . Neck pain on right side 10/15/2014  . Acute sinusitis 09/26/2014  . Dyslipidemia 06/18/2014  . Fracture of middle phalanx of finger 05/02/2014  . Hand pain, right 04/19/2014  . DM2 (diabetes mellitus, type 2) ([place]) 04/19/2014  . Hematoma of leg 12/15/2013  . Phlebitis of superficial vein of lower extremity 11/30/2013  . Otitis, externa, infective 11/30/2013  . Onychomycosis 08/30/2013  . Lower leg pain 05/10/2013  . Corn or callus 02/09/2013  . Acute posthemorrhagic anemia 01/19/2013  . Constipation 12/16/2012  . Left hip pain 12/16/2012  . Osteoarthritis of hip Left 11/27/2012  . Rash and nonspecific skin eruption 07/27/2012  . Cough 03/31/2012  . Diarrhea 02/05/2012  . Microhematuria 09/09/2011  . Elevated LFTs 09/09/2011  . B12 deficiency 06/03/2011  . Cellulitis 03/10/2011  . Edema 03/10/2011  .  Paresthesia of arm 02/25/2011  . Memory difficulty 11/03/2010  . Fibromyalgia syndrome 07/10/2010  . OSA (obstructive sleep apnea) 07/02/2010  . SIALADENITIS 11/25/2009  . TOBACCO USE, QUIT 04/04/2009  . ALLERGIC RHINITIS CAUSE UNSPECIFIED 10/04/2008  . URTICARIA 10/04/2008  . URINARY RETENTION 09/27/2008  . HIP PAIN 07/17/2008  . CERVICAL RADICULITIS 07/21/2007  . Headache 07/21/2007  . Osteoporosis 02/16/2007  . LOW BACK PAIN 12/27/2006  . Obesity 08/07/2006  . Anxiety 08/07/2006  . Depression  08/07/2006  . Hypertension, essential 08/07/2006  . OSTEOARTHRITIS 08/07/2006    Past Surgical History:  Procedure Laterality Date  . cervical cryotherapy     cervix  . CYSTOCELE REPAIR N/A 11/01/2018   Procedure: CYSTOSCOPY ANTERIOR REPAIR (CYSTOCELE);  Surgeon: Bjorn Loser, MD;  Location: [facility];  Service: Urology;  Laterality: N/A;  . EYE SURGERY     Cataracts/Bil  . LASIK Bilateral   . TOTAL ABDOMINAL HYSTERECTOMY    . TOTAL HIP ARTHROPLASTY  09/2008   right- [place]/  . TOTAL HIP ARTHROPLASTY Left 11/28/2012   Procedure: TOTAL HIP ARTHROPLASTY;  Surgeon: Kerin Salen, MD;  Location: [facility];  Service: Orthopedics;  Laterality: Left;     OB History    Gravida  1   Para  1   Term  1   Preterm      AB      Living  1     SAB      IAB      Ectopic      Multiple      Live Births  1           Family History  Problem Relation Age of Onset  . Hypertension Mother   . Diabetes Mother   . Other Mother        DVT  . Heart disease Father   . Multiple sclerosis Father   . Heart attack Father   . Kidney disease Brother 53       ESRD    Social History   Tobacco Use  . Smoking status: Former Smoker    Packs/day: 2.00    Years: 25.00    Pack years: 50.00    Types: Cigarettes    Quit date: 01/13/1983    Years since quitting: 37.0  . Smokeless tobacco: Never Used  Vaping Use  . Vaping Use: Never used  Substance Use Topics  . Alcohol use: No    Alcohol/week: 0.0 standard drinks  . Drug use: No    Home Medications Prior to Admission medications   Medication Sig Start Date End Date Taking? Authorizing Provider  Biotin 10000 MCG TABS Take by mouth.    [provider]  Blood Glucose Monitoring Suppl (ONETOUCH VERIO) w/Device KIT Use to check blood sugar daily. DX E11.9 03/31/19   [name], Evie Lacks, MD  ciclopirox (PENLAC) 8 % solution APPLY OVER NAIL AND SURROUNDING SKIN AT BEDTIME OVER PREVIOUS COAT. AFTER 7 DAYS, MAY REMOVE WITH ALCOHOL  AND CONTINUE CYCLE. 11/17/19   [name], Evie Lacks, MD  Cyanocobalamin (B-12) 5000 MCG CAPS Take 1 tablet by mouth once a week.     [provider]  diazepam (VALIUM) 5 MG tablet Take 1-2 tablets (5-10 mg total) by mouth every 12 (twelve) hours as needed for anxiety or muscle spasms. 11/20/19   [name], Evie Lacks, MD  diphenhydrAMINE (BENADRYL ALLERGY) 25 mg capsule Take 25 mg by mouth daily as needed (allergies).     [provider]  fluticasone Asencion Islam)  50 MCG/ACT nasal spray Place 1 spray into both nostrils daily as needed for allergies or rhinitis.    [provider]  fluticasone (FLONASE) 50 MCG/ACT nasal spray Place 2 sprays into both nostrils daily. 12/18/19   [name], Evie Lacks, MD  glucose blood (ONETOUCH VERIO) test strip Use to check blood sugar daily. DX E11.9 07/04/19   [name], Evie Lacks, MD  loratadine (CLARITIN) 10 MG tablet Take 1 tablet (10 mg total) by mouth daily. 12/18/19   [name], Evie Lacks, MD  Magnesium 400 MG TABS Take 1 tablet by mouth daily. 01/19/19   [name], Evie Lacks, MD  magnesium oxide (MAG-OX) 400 MG tablet Take 400 mg by mouth daily.    [provider]  metFORMIN (GLUCOPHAGE) 500 MG tablet TAKE 1 TABLET BY MOUTH TWICE A DAY WITH A MEAL 10/30/19   [name], Evie Lacks, MD  Omega-3 Fatty Acids (FISH OIL) 1200 MG CAPS Take 1 capsule by mouth in the morning and at bedtime.    [provider]  OneTouch Delica Lancets 27O MISC 1 each by Does not apply route in the morning and at bedtime. DX: E11.9 06/15/19   [name], Evie Lacks, MD  potassium chloride (KLOR-CON) 8 MEQ tablet TAKE ONE TABLET BY MOUTH TWICE DAILY  05/31/19   [name], Evie Lacks, MD  Probiotic Product (DIGESTIVE ADVANTAGE GUMMIES PO) Take 1 each by mouth in the morning and at bedtime.    [provider]  Probiotic Product (PROBIOTIC PO) Take by mouth 2 (two) times daily.    [provider]  Turmeric Curcumin 500 MG CAPS Take 1 capsule in the  morning and 2 at bedtime    [provider]  venlafaxine XR (EFFEXOR XR) 37.5 MG 24 hr capsule Take 1 capsule (37.5 mg total) by mouth daily with breakfast. 11/20/19   [name], Evie Lacks, MD    Allergies    Celecoxib, Enalapril, Gabapentin, Losartan, Lovastatin, Lyrica [pregabalin], Naproxen, Pravachol [pravastatin sodium], and Zetia [ezetimibe]  Review of Systems   Review of Systems  Constitutional: Negative for chills, diaphoresis, fatigue and fever.  HENT: Negative for congestion, sore throat and trouble swallowing.   Eyes: Negative for pain and visual disturbance.  Respiratory: Negative for cough, shortness of breath and wheezing.   Cardiovascular: Negative for chest pain, palpitations and leg swelling.  Gastrointestinal: Negative for abdominal distention, abdominal pain, diarrhea, nausea and vomiting.  Genitourinary: Negative for difficulty urinating.  Musculoskeletal: Positive for arthralgias. Negative for back pain, neck pain and neck stiffness.  Skin: Negative for pallor.  Neurological: Negative for dizziness, speech difficulty, weakness and headaches.  Psychiatric/Behavioral: Negative for confusion.    Physical Exam Updated Vital Signs BP 125/76   Pulse 64   Temp 97.9 F (36.6 C) (Oral)   Resp 16   Ht 5' 3"  (1.6 m)   Wt 63.5 kg   SpO2 100%   BMI 24.80 kg/m   Physical Exam Constitutional:      General: She is not in acute distress.    Appearance: Normal appearance. She is not ill-appearing, toxic-appearing or diaphoretic.  HENT:     Head: Normocephalic and atraumatic.  Eyes:     Extraocular Movements: Extraocular movements intact.     Pupils: Pupils are equal, round, and reactive to light.  Cardiovascular:     Rate and Rhythm: Normal rate and regular rhythm.     Pulses: Normal pulses.  Pulmonary:     Effort: Pulmonary effort is normal. No respiratory distress.     Breath  sounds: Normal breath sounds. No wheezing.  Musculoskeletal:        General:  Normal range of motion.     Cervical back: Normal range of motion.     Right lower leg: No edema.     Left lower leg: No edema.     Comments: Left lower extremity with tenderness to calf.  No discoloration.  No erythema or edema.  Compartments are soft.  Normal leg raise.  No knee or ankle pain.  Able to range leg in all directions with good strength and sensation throughout.  PT pulses 2+ and strong bilaterally.  Normal gait.  Skin:    General: Skin is warm and dry.     Capillary Refill: Capillary refill takes less than 2 seconds.     Coloration: Skin is not jaundiced.     Findings: No rash.  Neurological:     General: No focal deficit present.     Mental Status: She is alert and oriented to person, place, and time.     Cranial Nerves: No cranial nerve deficit.     Sensory: No sensory deficit.     Motor: No weakness.     Coordination: Coordination normal.  Psychiatric:        Mood and Affect: Mood normal.        Behavior: Behavior normal.        Thought Content: Thought content normal.     ED Results / Procedures / Treatments   Labs (all labs ordered are listed, but only abnormal results are displayed) Labs Reviewed  CBC  BASIC METABOLIC PANEL    EKG None  Radiology US Venous Img Lower  Left (DVT Study)  Result Date: 01/06/2020 CLINICAL DATA:  Left calf pain EXAM: LEFT LOWER EXTREMITY VENOUS DOPPLER ULTRASOUND TECHNIQUE: Gray-scale sonography with graded compression, as well as color Doppler and duplex ultrasound were performed to evaluate the lower extremity deep venous systems from the level of the common femoral vein and including the common femoral, femoral, profunda femoral, popliteal and calf veins including the posterior tibial, peroneal and gastrocnemius veins when visible. The superficial great saphenous vein was also interrogated. Spectral Doppler was utilized to evaluate flow at rest and with distal augmentation maneuvers in the common femoral, femoral and popliteal  veins. COMPARISON:  None. FINDINGS: Contralateral Common Femoral Vein: Respiratory phasicity is normal and symmetric with the symptomatic side. No evidence of thrombus. Normal compressibility. Common Femoral Vein: No evidence of thrombus. Normal compressibility, respiratory phasicity and response to augmentation. Saphenofemoral Junction: No evidence of thrombus. Normal compressibility and flow on color Doppler imaging. Profunda Femoral Vein: No evidence of thrombus. Normal compressibility and flow on color Doppler imaging. Femoral Vein: No evidence of thrombus. Normal compressibility, respiratory phasicity and response to augmentation. Popliteal Vein: No evidence of thrombus. Normal compressibility, respiratory phasicity and response to augmentation. Calf Veins: No evidence of thrombus. Normal compressibility and flow on color Doppler imaging. Superficial Great Saphenous Vein: No evidence of thrombus. Normal compressibility. Venous Reflux:  None. Other Findings:  None. IMPRESSION: No evidence of left lower extremity deep venous thrombosis. Electronically Signed   By: Davina Poke D.O.   On: 01/06/2020 13:25    Procedures Procedures (including critical care time)  Medications Ordered in ED Medications - No data to display  ED Course  I have reviewed the triage vital signs and the nursing notes.  Pertinent labs & imaging results that were available during my care of the patient were reviewed by me and considered in  my medical decision making (see chart for details).  Clinical Course as of 01/06/20 1430  Sat Jan 06, 2020  1247 US Venous Img Lower  Left (DVT Study) [SP]    Clinical Course User Index [SP] Alfredia Client, PA-C   MDM Rules/Calculators/A&P                          Tiffany Gibbs is a 74 y.o. female past medical history of depression, diabetes, fibromyalgia, hypertension, hyperlipidemia, OSA, osteoarthritis that presents the emergency department today for left lower  extremity calf pain for the past 2 weeks.  Patient is distally neurovascularly intact with calf tenderness to left lower leg.  No infectious symptoms. No edma. e  Compartments are soft.  Concern for DVT, will obtain ultrasound at this time.  Ultrasound negative for DVT, this is most likely muscle cramp.  Potassium normal, CBC and CMP unremarkable.  Patient continued to taking medication prescribed by PCP for muscle cramps.  Patient to follow-up with PCP.  Symptomatic treatment discussed.  Patient be discharged.  Doubt need for further emergent work up at this time. I explained the diagnosis and have given explicit precautions to return to the ER including for any other new or worsening symptoms. The patient understands and accepts the medical plan as it's been dictated and I have answered their questions. Discharge instructions concerning home care and prescriptions have been given. The patient is STABLE and is discharged to home in good condition.   Final Clinical Impression(s) / ED Diagnoses Final diagnoses:  Left leg pain    Rx / DC Orders ED Discharge Orders    None       Alfredia Client, PA-C 01/06/20 1432    Dorie Rank, MD 01/07/20 [phone]

## 2020-01-06 NOTE — ED Notes (Signed)
Patient c/o left calf pain x 2 weeks, worse with walking.  Not swollen, no warmth noted, distal pulses present

## 2020-01-06 NOTE — Discharge Instructions (Signed)
Your work-up today was reassuring, there is no evidence of a blood clot in your leg.  You most likely have muscle cramps, please use the attached instructions.  Follow-up with your primary care doctor in the next couple of days.  If you have any new or worsening concerning symptoms please come back to the emergency department.  Try massaging, stretching, and relaxing the affected muscle. Do this for several minutes at a time. If directed, put ice on areas that are sore or painful after a cramp: Put ice in a plastic bag. Place a towel between your skin and the bag. Leave the ice on for 20 minutes, 2-3 times a day. If directed, apply heat to muscles that are tense or tight. Do this before you exercise, or as often as told by your health care provider. Use the heat source that your health care provider recommends, such as a moist heat pack or a heating pad. Place a towel between your skin and the heat source. Leave the heat on for 20-30 minutes. Remove the heat if your skin turns bright red. This is especially important if you are unable to feel pain, heat, or cold. You may have a greater risk of getting burned. Try taking hot showers or baths to help relax tight muscles.

## 2020-01-06 NOTE — ED Triage Notes (Signed)
Pt arrives pov with driver with c/o LLE calf pain x 2 weeks. Pt denies injury, denies recent travel.

## 2020-01-07 DIAGNOSIS — R69 Illness, unspecified: Secondary | ICD-10-CM | POA: Diagnosis not present

## 2020-01-10 DIAGNOSIS — G4733 Obstructive sleep apnea (adult) (pediatric): Secondary | ICD-10-CM | POA: Diagnosis not present

## 2020-01-30 ENCOUNTER — Other Ambulatory Visit: Payer: Self-pay | Admitting: Internal Medicine

## 2020-01-30 DIAGNOSIS — E785 Hyperlipidemia, unspecified: Secondary | ICD-10-CM

## 2020-01-30 DIAGNOSIS — E119 Type 2 diabetes mellitus without complications: Secondary | ICD-10-CM

## 2020-02-01 ENCOUNTER — Telehealth: Payer: Self-pay | Admitting: Internal Medicine

## 2020-02-01 DIAGNOSIS — E538 Deficiency of other specified B group vitamins: Secondary | ICD-10-CM

## 2020-02-01 DIAGNOSIS — Z0000 Encounter for general adult medical examination without abnormal findings: Secondary | ICD-10-CM

## 2020-02-01 DIAGNOSIS — I10 Essential (primary) hypertension: Secondary | ICD-10-CM

## 2020-02-01 DIAGNOSIS — F411 Generalized anxiety disorder: Secondary | ICD-10-CM

## 2020-02-01 DIAGNOSIS — I8002 Phlebitis and thrombophlebitis of superficial vessels of left lower extremity: Secondary | ICD-10-CM

## 2020-02-01 DIAGNOSIS — E119 Type 2 diabetes mellitus without complications: Secondary | ICD-10-CM

## 2020-02-01 DIAGNOSIS — J4520 Mild intermittent asthma, uncomplicated: Secondary | ICD-10-CM

## 2020-02-01 DIAGNOSIS — R413 Other amnesia: Secondary | ICD-10-CM

## 2020-02-01 MED ORDER — METFORMIN HCL 500 MG PO TABS: ORAL_TABLET | ORAL | 2 refills | Status: DC

## 2020-02-01 NOTE — Telephone Encounter (Signed)
Reviewed chart pt is up-to-date sent refills to. Pof../lmb 

## 2020-02-01 NOTE — Telephone Encounter (Signed)
1.Medication Requested: metFORMIN (GLUCOPHAGE) 500 MG tablet     2. Pharmacy (Name, [place], [place]): [facility] # [place], [place]  3. On Med List: yes   4. Last Visit with PCP: 12.6.21  5. Next visit date with PCP: 3.7.22  Patient said that the pharmacy told her she is needing a new prescription for the above medication.    Agent: Please be advised that RX refills may take up to 3 business days. We ask that you follow-up with your pharmacy.

## 2020-02-08 DIAGNOSIS — G4733 Obstructive sleep apnea (adult) (pediatric): Secondary | ICD-10-CM | POA: Diagnosis not present

## 2020-02-09 ENCOUNTER — Encounter: Payer: Self-pay | Admitting: Sports Medicine

## 2020-02-09 ENCOUNTER — Ambulatory Visit (INDEPENDENT_AMBULATORY_CARE_PROVIDER_SITE_OTHER): Payer: Medicare HMO | Admitting: Sports Medicine

## 2020-02-09 ENCOUNTER — Other Ambulatory Visit: Payer: Self-pay

## 2020-02-09 DIAGNOSIS — M21619 Bunion of unspecified foot: Secondary | ICD-10-CM

## 2020-02-09 DIAGNOSIS — B351 Tinea unguium: Secondary | ICD-10-CM

## 2020-02-09 DIAGNOSIS — M79674 Pain in right toe(s): Secondary | ICD-10-CM | POA: Diagnosis not present

## 2020-02-09 DIAGNOSIS — M2040 Other hammer toe(s) (acquired), unspecified foot: Secondary | ICD-10-CM

## 2020-02-09 DIAGNOSIS — E1142 Type 2 diabetes mellitus with diabetic polyneuropathy: Secondary | ICD-10-CM | POA: Diagnosis not present

## 2020-02-09 DIAGNOSIS — M79675 Pain in left toe(s): Secondary | ICD-10-CM | POA: Diagnosis not present

## 2020-02-09 NOTE — Progress Notes (Signed)
Subjective: Tiffany Gibbs is a 75 y.o. female patient with history of diabetes who returns to office today complaining of long,mildly painful nails  while ambulating in shoes; unable to trim..  Reports her glucose reading this morning  Was 94, A1c 5.6, PCP 3 months ago.  Reports that she has questions about her bunion and is interested in surgery.No other issues.   Patient Active Problem List   Diagnosis Date Noted  . 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 Grief 07/25/2019  . Renal cyst 07/25/2019  . Cystocele with prolapse 11/01/2018  . Cramp of limb 08/16/2018  . 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 Obstructive sleep apnea treated with continuous positive airway pressure (CPAP) 06/04/2017  . Degenerative arthritis of knee, bilateral 05/25/2017  . Left lumbar radiculopathy 04/27/2017  . Right knee pain 04/27/2017  . Tick bite 08/18/2016  . Drusen of macula of both eyes 05/04/2016  . Dry eyes, bilateral 05/04/2016  . Pseudophakia of both eyes 05/04/2016  . PVD (posterior vitreous detachment), both eyes 05/04/2016  . Dupuytren's disease of palm 04/01/2016  . Degenerative disc disease, lumbar 12/04/2015  . Elevated hemoglobin ([place]) 04/17/2015  . Occipital neuralgia of right side 04/17/2015  . Well adult exam 11/19/2014  . Asthmatic bronchitis 10/15/2014  . Neck pain on right side 10/15/2014  . Acute sinusitis 09/26/2014  . Dyslipidemia 06/18/2014  . Fracture of middle phalanx of finger 05/02/2014  . Hand pain, right 04/19/2014  . DM2 (diabetes mellitus, type 2) ([place]) 04/19/2014  . Hematoma of leg 12/15/2013  . Phlebitis of superficial vein of lower extremity 11/30/2013  . Otitis, externa, infective 11/30/2013  . Onychomycosis 08/30/2013  . Lower leg pain 05/10/2013  . Corn or callus 02/09/2013  . Acute posthemorrhagic anemia 01/19/2013  . Constipation 12/16/2012  . Left hip pain 12/16/2012  . Osteoarthritis of hip Left 11/27/2012  .  Rash and nonspecific skin eruption 07/27/2012  . Cough 03/31/2012  . Diarrhea 02/05/2012  . Microhematuria 09/09/2011  . Elevated LFTs 09/09/2011  . B12 deficiency 06/03/2011  . Cellulitis 03/10/2011  . Edema 03/10/2011  . Paresthesia of arm 02/25/2011  . Memory difficulty 11/03/2010  . Fibromyalgia syndrome 07/10/2010  . OSA (obstructive sleep apnea) 07/02/2010  . SIALADENITIS 11/25/2009  . TOBACCO USE, QUIT 04/04/2009  . ALLERGIC RHINITIS CAUSE UNSPECIFIED 10/04/2008  . URTICARIA 10/04/2008  . URINARY RETENTION 09/27/2008  . HIP PAIN 07/17/2008  . CERVICAL RADICULITIS 07/21/2007  . Headache 07/21/2007  . Osteoporosis 02/16/2007  . LOW BACK PAIN 12/27/2006  . Obesity 08/07/2006  . Anxiety 08/07/2006  . Depression 08/07/2006  . Hypertension, essential 08/07/2006  . OSTEOARTHRITIS 08/07/2006   Current Outpatient Medications on File Prior to Visit  Medication Sig Dispense Refill  . Biotin 10000 MCG TABS Take by mouth.    . Blood Glucose Monitoring Suppl (ONETOUCH VERIO) w/Device KIT Use to check blood sugar daily. DX E11.9 1 kit 0  . ciclopirox (PENLAC) 8 % solution APPLY OVER NAIL AND SURROUNDING SKIN AT BEDTIME OVER PREVIOUS COAT. AFTER 7 DAYS, MAY REMOVE WITH ALCOHOL AND CONTINUE CYCLE. 6.6 mL 0  . Cyanocobalamin (B-12) 5000 MCG CAPS Take 1 tablet by mouth once a week.     . diazepam (VALIUM) 5 MG tablet Take 1-2 tablets (5-10 mg total) by mouth every 12 (twelve) hours as needed for anxiety or muscle spasms. 30 tablet 5  . diphenhydrAMINE (BENADRYL ALLERGY) 25 mg capsule Take 25 mg by mouth daily as needed (allergies).     [name]  fluticasone (FLONASE) 50 MCG/ACT nasal spray Place 1 spray into both nostrils daily as needed for allergies or rhinitis.    . fluticasone (FLONASE) 50 MCG/ACT nasal spray Place 2 sprays into both nostrils daily. 16 g 6  . glucose blood (ONETOUCH VERIO) test strip Use to check blood sugar daily. DX E11.9 200 each 3  . loratadine (CLARITIN) 10 MG tablet Take  1 tablet (10 mg total) by mouth daily. 30 tablet 11  . Magnesium 400 MG TABS Take 1 tablet by mouth daily. 100 tablet 3  . magnesium oxide (MAG-OX) 400 MG tablet Take 400 mg by mouth daily.    . metFORMIN (GLUCOPHAGE) 500 MG tablet TAKE 1 TABLET BY MOUTH TWICE A DAY WITH A MEAL 180 tablet 2  . Omega-3 Fatty Acids (FISH OIL) 1200 MG CAPS Take 1 capsule by mouth in the morning and at bedtime.    [name] Lancets 78I MISC 1 each by Does not apply route in the morning and at bedtime. DX: E11.9 200 each 2  . potassium chloride (KLOR-CON) 8 MEQ tablet TAKE ONE TABLET BY MOUTH TWICE DAILY  180 tablet 3  . Probiotic Product (DIGESTIVE ADVANTAGE GUMMIES PO) Take 1 each by mouth in the morning and at bedtime.    . Probiotic Product (PROBIOTIC PO) Take by mouth 2 (two) times daily.    . Turmeric Curcumin 500 MG CAPS Take 1 capsule in the morning and 2 at bedtime    . venlafaxine XR (EFFEXOR XR) 37.5 MG 24 hr capsule Take 1 capsule (37.5 mg total) by mouth daily with breakfast. 30 capsule 5   No current facility-administered medications on file prior to visit.   Allergies  Allergen Reactions  . Celecoxib     swell  . Enalapril     cough  . Gabapentin Other (See Comments)    dizziness  . Losartan     falls  . Lovastatin     REACTION: aches  . Lyrica [Pregabalin] Other (See Comments)    dizziness  . Naproxen   . Pravachol [Pravastatin Sodium]     Nausea   . Zetia [Ezetimibe]     Myalgias in LEs    Recent Results (from the past 2160 hour(s))  Hemoglobin A1c     Status: None   Collection Time: 11/14/19  9:10 AM  Result Value Ref Range   Hgb A1c MFr Bld 5.5 4.6 - 6.5 %    Comment: Glycemic Control Guidelines for People with Diabetes:Non Diabetic:  <6%Goal of Therapy: <7%Additional Action Suggested:  >6%   Basic metabolic panel     Status: None   Collection Time: 11/14/19  9:10 AM  Result Value Ref Range   Sodium 140 135 - 145 mEq/L   Potassium 4.0 3.5 - 5.1 mEq/L   Chloride 106  96 - 112 mEq/L   CO2 27 19 - 32 mEq/L   Glucose, Bld 86 70 - 99 mg/dL   BUN 12 6 - 23 mg/dL   Creatinine, Ser 0.91 0.40 - 1.20 mg/dL   GFR 62.21 >60.00 mL/min    Comment: Calculated using the CKD-EPI Creatinine Equation (2021)   Calcium 9.3 8.4 - 10.5 mg/dL  Novel Coronavirus, NAA ([facility])     Status: None   Collection Time: 11/17/19  1:33 PM   Specimen: Nasopharyngeal(NP) swabs in vial transport medium   Nasopharynge  Screenin  Result Value Ref Range   SARS-CoV-2, NAA Not Detected Not Detected    Comment: This nucleic acid amplification  test was developed and its performance characteristics determined by [employer]. Nucleic acid amplification tests include RT-PCR and TMA. This test has not been FDA cleared or approved. This test has been authorized by FDA under an Emergency Use Authorization (EUA). This test is only authorized for the duration of time the declaration that circumstances exist justifying the authorization of the emergency use of in vitro diagnostic tests for detection of SARS-CoV-2 virus and/or diagnosis of COVID-19 infection under section 564(b)(1) of the Act, 21 U.S.C. 322GUR-4(Y) (1), unless the authorization is terminated or revoked sooner. When diagnostic testing is negative, the possibility of a false negative result should be considered in the context of a patient's recent exposures and the presence of clinical signs and symptoms consistent with COVID-19. An individual without symptoms of COVID-19 and who is not shedding SARS-CoV-2 virus wo uld expect to have a negative (not detected) result in this assay.   SARS-COV-2, NAA 2 DAY TAT     Status: None   Collection Time: 11/17/19  1:33 PM   Nasopharynge  Screenin  Result Value Ref Range   SARS-CoV-2, NAA 2 DAY TAT Performed   Microalbumin / creatinine urine ratio     Status: None   Collection Time: 12/14/19 12:41 PM  Result Value Ref Range   Microalb, Ur <0.7 0.0 - 1.9 mg/dL   Creatinine,U 49.6  mg/dL   Microalb Creat Ratio 1.4 0.0 - 30.0 mg/g  Urinalysis, Routine w reflex microscopic     Status: Abnormal   Collection Time: 12/14/19 12:41 PM  Result Value Ref Range   Color, Urine YELLOW Yellow;Lt. Yellow;Straw;Dark Yellow;Amber;Green;Red;Brown   APPearance CLEAR Clear;Turbid;Slightly Cloudy;Cloudy   Specific Gravity, Urine 1.015 1.000 - 1.030   pH 7.0 5.0 - 8.0   Total Protein, Urine NEGATIVE Negative   Urine Glucose NEGATIVE Negative   Ketones, ur NEGATIVE Negative   Bilirubin Urine NEGATIVE Negative   Hgb urine dipstick SMALL (A) Negative   Urobilinogen, UA 0.2 0.0 - 1.0   Leukocytes,Ua NEGATIVE Negative   Nitrite NEGATIVE Negative   WBC, UA 0-2/hpf 0-2/hpf   RBC / HPF 3-6/hpf (A) 0-2/hpf   Squamous Epithelial / LPF Rare(0-4/hpf) Rare(0-4/hpf)  CBC     Status: None   Collection Time: 01/06/20  1:38 PM  Result Value Ref Range   WBC 5.9 4.0 - 10.5 K/uL   RBC 4.54 3.87 - 5.11 MIL/uL   Hemoglobin 13.8 12.0 - 15.0 g/dL   HCT 41.5 36.0 - 46.0 %   MCV 91.4 80.0 - 100.0 fL   MCH 30.4 26.0 - 34.0 pg   MCHC 33.3 30.0 - 36.0 g/dL   RDW 12.4 11.5 - 15.5 %   Platelets 215 150 - 400 K/uL   nRBC 0.0 0.0 - 0.2 %    Comment: Performed at [facility], [address] [place], [place] [address]  Basic metabolic panel     Status: None   Collection Time: 01/06/20  1:38 PM  Result Value Ref Range   Sodium 138 135 - 145 mmol/L   Potassium 4.0 3.5 - 5.1 mmol/L   Chloride 101 98 - 111 mmol/L   CO2 28 22 - 32 mmol/L   Glucose, Bld 97 70 - 99 mg/dL    Comment: Glucose reference range applies only to samples taken after fasting for at least 8 hours.   BUN 16 8 - 23 mg/dL   Creatinine, Ser 0.94 0.44 - 1.00 mg/dL   Calcium 9.3 8.9 - 10.3 mg/dL  GFR, Estimated >60 >60 mL/min    Comment: (NOTE) Calculated using the CKD-EPI Creatinine Equation (2021)    Anion gap 9 5 - 15    Comment: Performed at [facility], [address] [place], [place] [address]     Objective: General: Patient is awake, alert, and oriented x 3 and in no acute distress.  Integument: Skin is warm, dry and supple bilateral. Nails are tender, long, thickened and dystrophic with subungual debris, consistent with onychomycosis, 1-5 bilateral with bilateral hallux most involved improving in nature. No signs of infection. No open lesions or preulcerative lesions present bilateral. Remaining integument unremarkable.  Vasculature:  Dorsalis Pedis pulse 1/4 bilateral. Posterior Tibial pulse  1/4 bilateral.  Capillary fill time <3 sec 1-5 bilateral. Positive hair growth to the level of the digits. Temperature gradient within normal limits.  Minimal varicosities present bilateral. No edema present bilateral.   Neurology: The patient has intact sensation measured with a 5.07/10g Semmes Weinstein Monofilament at all pedal sites bilateral. Vibratory sensation diminished bilateral with tuning fork. No Babinski sign present bilateral.  Subjective cramping occasionally in both feet and lower extremities.  Musculoskeletal: + Bunoin and  hammertoe pedal deformities noted bilateral. Muscular strength 5/5 in all lower extremity muscular groups bilateral without pain on range of motion . No tenderness with calf compression bilateral.  Assessment and Plan: Problem List Items Addressed This Visit   None   Visit Diagnoses    Pain due to onychomycosis of toenails of both feet    -  Primary   Diabetic polyneuropathy associated with type 2 diabetes mellitus (HCC)       Bunion       Hammer toe, unspecified laterality          -Examined patient. -Re-discussed  diabetic foot care  -Mechanically debrided all nails 1-5 bilateral using sterile nail nipper and filed with dremel without incident  -Continue with penlac or Tea tree oil to nails as previously  -Advised patient that for bunion surgery 3 months to recover and nwb 2 months; will need Blood work prior to surgery; can do it in OCT if she  wants to consider surgery later in the year -Answered all patient questions -Dispensed bunion tube foam to use as instructed -Patient to return  in 3 months for at risk foot care  -Patient advised to call the office if any problems or questions arise in the meantime.  Landis Martins, DPM

## 2020-02-19 ENCOUNTER — Telehealth: Payer: Self-pay | Admitting: Internal Medicine

## 2020-02-19 NOTE — Telephone Encounter (Signed)
Patient calling states that  diazepam (VALIUM) 5 MG tablet doesn't agree with her and she cant take it and she still isn't able to sleep at night. She has been taking nyquil every night to get some sleep.Patient wondering if there is any other medication that might help her  [place], [place] Phone:  [phone]  Fax:  [phone]

## 2020-02-19 NOTE — Telephone Encounter (Signed)
Try Benadryl 12.5, 25 or 50 mg at bedtime instead of NyQuil. Thanks

## 2020-02-20 NOTE — Telephone Encounter (Signed)
Notified pt w/MD response.../lmb 

## 2020-03-06 DIAGNOSIS — G4733 Obstructive sleep apnea (adult) (pediatric): Secondary | ICD-10-CM | POA: Diagnosis not present

## 2020-03-14 ENCOUNTER — Other Ambulatory Visit: Payer: Self-pay | Admitting: Internal Medicine

## 2020-03-15 ENCOUNTER — Other Ambulatory Visit (INDEPENDENT_AMBULATORY_CARE_PROVIDER_SITE_OTHER): Payer: Medicare HMO

## 2020-03-15 ENCOUNTER — Other Ambulatory Visit: Payer: Self-pay

## 2020-03-15 DIAGNOSIS — E785 Hyperlipidemia, unspecified: Secondary | ICD-10-CM | POA: Diagnosis not present

## 2020-03-15 DIAGNOSIS — E119 Type 2 diabetes mellitus without complications: Secondary | ICD-10-CM | POA: Diagnosis not present

## 2020-03-15 LAB — URINALYSIS, ROUTINE W REFLEX MICROSCOPIC
Bilirubin Urine: NEGATIVE
Ketones, ur: NEGATIVE
Leukocytes,Ua: NEGATIVE
Nitrite: NEGATIVE
Specific Gravity, Urine: 1.02 (ref 1.000–1.030)
Total Protein, Urine: NEGATIVE
Urine Glucose: NEGATIVE
Urobilinogen, UA: 0.2 (ref 0.0–1.0)
pH: 6.5 (ref 5.0–8.0)

## 2020-03-15 LAB — COMPREHENSIVE METABOLIC PANEL
ALT: 12 U/L (ref 0–35)
AST: 15 U/L (ref 0–37)
Albumin: 4.4 g/dL (ref 3.5–5.2)
Alkaline Phosphatase: 54 U/L (ref 39–117)
BUN: 23 mg/dL (ref 6–23)
CO2: 26 mEq/L (ref 19–32)
Calcium: 9.7 mg/dL (ref 8.4–10.5)
Chloride: 106 mEq/L (ref 96–112)
Creatinine, Ser: 0.86 mg/dL (ref 0.40–1.20)
GFR: 66.42 mL/min (ref >60.00)
Glucose, Bld: 90 mg/dL (ref 70–99)
Potassium: 3.7 mEq/L (ref 3.5–5.1)
Sodium: 142 mEq/L (ref 135–145)
Total Bilirubin: 0.6 mg/dL (ref 0.2–1.2)
Total Protein: 7.3 g/dL (ref 6.0–8.3)

## 2020-03-15 LAB — LIPID PANEL
Cholesterol: 199 mg/dL (ref 0–200)
HDL: 69.4 mg/dL (ref >39.00)
LDL Cholesterol: 119 mg/dL — ABNORMAL HIGH (ref 0–99)
NonHDL: 130
Total CHOL/HDL Ratio: 3
Triglycerides: 57 mg/dL (ref 0.0–149.0)
VLDL: 11.4 mg/dL (ref 0.0–40.0)

## 2020-03-15 LAB — HEMOGLOBIN A1C: Hgb A1c MFr Bld: 5.3 % (ref 4.6–6.5)

## 2020-03-15 LAB — MICROALBUMIN / CREATININE URINE RATIO
Creatinine,U: 78.6 mg/dL
Microalb Creat Ratio: 0.9 mg/g (ref 0.0–30.0)
Microalb, Ur: <0.7 mg/dL (ref 0.0–1.9)

## 2020-03-15 LAB — BASIC METABOLIC PANEL
BUN: 23 mg/dL (ref 6–23)
CO2: 26 mEq/L (ref 19–32)
Calcium: 9.7 mg/dL (ref 8.4–10.5)
Chloride: 106 mEq/L (ref 96–112)
Creatinine, Ser: 0.86 mg/dL (ref 0.40–1.20)
GFR: 66.42 mL/min (ref >60.00)
Glucose, Bld: 90 mg/dL (ref 70–99)
Potassium: 3.7 mEq/L (ref 3.5–5.1)
Sodium: 142 mEq/L (ref 135–145)

## 2020-03-16 ENCOUNTER — Encounter: Payer: Self-pay | Admitting: Internal Medicine

## 2020-03-18 ENCOUNTER — Ambulatory Visit (INDEPENDENT_AMBULATORY_CARE_PROVIDER_SITE_OTHER): Payer: Medicare HMO | Admitting: Internal Medicine

## 2020-03-18 ENCOUNTER — Other Ambulatory Visit: Payer: Self-pay | Admitting: Internal Medicine

## 2020-03-18 ENCOUNTER — Encounter: Payer: Self-pay | Admitting: Internal Medicine

## 2020-03-18 ENCOUNTER — Other Ambulatory Visit: Payer: Self-pay

## 2020-03-18 ENCOUNTER — Ambulatory Visit (INDEPENDENT_AMBULATORY_CARE_PROVIDER_SITE_OTHER): Payer: Medicare HMO

## 2020-03-18 VITALS — BP 116/80 | HR 64 | Temp 98.4°F | Ht 64.0 in | Wt 137.4 lb

## 2020-03-18 VITALS — BP 116/80 | HR 64 | Temp 98.4°F | Resp 16 | Ht 64.0 in | Wt 137.4 lb

## 2020-03-18 DIAGNOSIS — Z6834 Body mass index (BMI) 34.0-34.9, adult: Secondary | ICD-10-CM | POA: Diagnosis not present

## 2020-03-18 DIAGNOSIS — N281 Cyst of kidney, acquired: Secondary | ICD-10-CM

## 2020-03-18 DIAGNOSIS — E538 Deficiency of other specified B group vitamins: Secondary | ICD-10-CM | POA: Diagnosis not present

## 2020-03-18 DIAGNOSIS — I10 Essential (primary) hypertension: Secondary | ICD-10-CM

## 2020-03-18 DIAGNOSIS — R413 Other amnesia: Secondary | ICD-10-CM | POA: Diagnosis not present

## 2020-03-18 DIAGNOSIS — E119 Type 2 diabetes mellitus without complications: Secondary | ICD-10-CM | POA: Diagnosis not present

## 2020-03-18 DIAGNOSIS — F419 Anxiety disorder, unspecified: Secondary | ICD-10-CM

## 2020-03-18 DIAGNOSIS — E6609 Other obesity due to excess calories: Secondary | ICD-10-CM

## 2020-03-18 DIAGNOSIS — Z0000 Encounter for general adult medical examination without abnormal findings: Secondary | ICD-10-CM

## 2020-03-18 DIAGNOSIS — R69 Illness, unspecified: Secondary | ICD-10-CM | POA: Diagnosis not present

## 2020-03-18 MED ORDER — VENLAFAXINE HCL ER 37.5 MG PO CP24: 75.0000 mg | ORAL_CAPSULE | Freq: Every day | ORAL | 1 refills | Status: DC

## 2020-03-18 NOTE — Progress Notes (Addendum)
Subjective:   Tiffany Gibbs is a 75 y.o. female who presents for Medicare Annual (Subsequent) preventive examination.  Review of Systems    No ROS. Medicare Wellness Visit. Additional risk factors are reflected in social history. Cardiac Risk Factors include: advanced age (>74mn, >>57women);diabetes mellitus;family history of premature cardiovascular disease     Objective:    Today's Vitals   03/18/20 1228  BP: 116/80  Pulse: 64  Resp: 16  Temp: 98.4 F (36.9 C)  SpO2: 99%  Weight: 137 lb 6.4 oz (62.3 kg)  Height: _0  (1.626 m)  PainSc: 0-No pain   Body mass index is 23.58 kg/m.  Advanced Directives 03/18/2020 01/06/2020 11/01/2018 10/28/2018 07/15/2018 09/09/2015 06/12/2015  Does Patient Have a Medical Advance Directive? Yes Yes Yes Yes No Yes Yes  Type of Advance Directive Living will;[place]care [place]  Does patient want to make changes to medical advance directive? No - Patient declined - No - Patient declined No - Patient declined - - -  Copy of [place]? No - copy requested - No - copy requested No - copy requested - No - copy requested Yes  Pre-existing out of facility DNR order (yellow form or pink MOST form) - - - - - - -    Current Medications (verified) Outpatient Encounter Medications as of 03/18/2020  Medication Sig   Blood Glucose Monitoring Suppl (ONETOUCH VERIO) w/Device KIT Use to check blood sugar daily. DX E11.9   ciclopirox (PENLAC) 8 % solution APPLY OVER NAIL AND SURROUNDING SKIN AT BEDTIME OVER PREVIOUS COAT. AFTER SEVEN DAYS, MAY REMOVE WITH ALCOHOL AND CONTINUE CYCLE   Cyanocobalamin (B-12) 5000 MCG CAPS Take 1 tablet by mouth once a week.    diphenhydrAMINE (BENADRYL) 25 mg capsule Take 25 mg by mouth daily as needed (allergies).    fluticasone (FLONASE) 50 MCG/ACT nasal  spray Place 1 spray into both nostrils daily as needed for allergies or rhinitis.   fluticasone (FLONASE) 50 MCG/ACT nasal spray Place 2 sprays into both nostrils daily.   glucose blood (ONETOUCH VERIO) test strip Use to check blood sugar daily. DX E11.9   loratadine (CLARITIN) 10 MG tablet Take 1 tablet (10 mg total) by mouth daily.   Magnesium 400 MG TABS Take 1 tablet by mouth daily.   magnesium oxide (MAG-OX) 400 MG tablet Take 400 mg by mouth daily.   metFORMIN (GLUCOPHAGE) 500 MG tablet TAKE 1 TABLET BY MOUTH TWICE A DAY WITH A MEAL   Omega-3 Fatty Acids (FISH OIL) 1200 MG CAPS Take 1 capsule by mouth in the morning and at bedtime.   OneTouch Delica Lancets 397QMISC 1 each by Does not apply route in the morning and at bedtime. DX: E11.9   potassium chloride (KLOR-CON) 8 MEQ tablet TAKE ONE TABLET BY MOUTH TWICE DAILY    Turmeric Curcumin 500 MG CAPS Take 1 capsule in the morning and 2 at bedtime   venlafaxine XR (EFFEXOR XR) 37.5 MG 24 hr capsule Take 1 capsule (37.5 mg total) by mouth daily with breakfast.   Probiotic Product (DIGESTIVE ADVANTAGE GUMMIES PO) Take 1 each by mouth in the morning and at bedtime. (Patient not taking: Reported on 03/18/2020)   Probiotic Product (PROBIOTIC PO) Take by mouth 2 (two) times daily. (Patient not taking: Reported on 03/18/2020)   [DISCONTINUED] Biotin 10000 MCG TABS Take by  mouth.   No facility-administered encounter medications on file as of 03/18/2020.    Allergies (verified) Celecoxib, Diazepam, Enalapril, Gabapentin, Losartan, Lovastatin, Lyrica [pregabalin], Naproxen, Pravachol [pravastatin sodium], and Zetia [ezetimibe]   History: Past Medical History:  Diagnosis Date   Anxiety    Cataract    Depression    DM type 2 (diabetes mellitus, type 2) (HCC)    Fibromyalgia    H/O dizziness    Hives    from tomatoes   HTN (hypertension)    Hyperlipidemia    Kidney disease    Low back pain    OSA (obstructive sleep apnea)    uses c-pap    Osteoarthritis    Osteoporosis    SOB (shortness of breath) on exertion    Past Surgical History:  Procedure Laterality Date   cervical cryotherapy     cervix   CYSTOCELE REPAIR N/A 11/01/2018   Procedure: CYSTOSCOPY ANTERIOR REPAIR (CYSTOCELE);  Surgeon: Bjorn Loser, MD;  Location: [facility];  Service: Urology;  Laterality: N/A;   EYE SURGERY     Cataracts/Bil   LASIK Bilateral    TOTAL ABDOMINAL HYSTERECTOMY     TOTAL HIP ARTHROPLASTY  09/2008   right- [place]/   TOTAL HIP ARTHROPLASTY Left 11/28/2012   Procedure: TOTAL HIP ARTHROPLASTY;  Surgeon: Kerin Salen, MD;  Location: [facility];  Service: Orthopedics;  Laterality: Left;   Family History  Problem Relation Age of Onset   Hypertension Mother    Diabetes Mother    Other Mother        DVT   Heart disease Father    Multiple sclerosis Father    Heart attack Father    Kidney disease Brother 58       ESRD   Social History   Socioeconomic History   Marital status: Married    Spouse name: Sam   Number of children: 1   Years of education: 12    Highest education level: Not on file  Occupational History   Occupation: N/A  Tobacco Use   Smoking status: Former Smoker    Packs/day: 2.00    Years: 25.00    Pack years: 50.00    Types: Cigarettes    Quit date: 01/13/1983    Years since quitting: 37.2   Smokeless tobacco: Never Used  Vaping Use   Vaping Use: Never used  Substance and Sexual Activity   Alcohol use: No    Alcohol/week: 0.0 standard drinks   Drug use: No   Sexual activity: Not Currently    Birth control/protection: Post-menopausal, Surgical  Other Topics Concern   Not on file  Social History Narrative   Was divorced - newly re-married as of 2009, Sam   1 son      Caffeine use- coffee 1 cup daily    Social Determinants of Health   Financial Resource Strain: Low Risk    Difficulty of Paying Living Expenses: Not hard at all  Food Insecurity: No Food Insecurity   Worried About Charity fundraiser in  the Last Year: Never true   [place] in the Last Year: Never true  Transportation Needs: No Transportation Needs   Lack of Transportation (Medical): No   Lack of Transportation (Non-Medical): No  Physical Activity: Sufficiently Active   Days of Exercise per Week: 5 days   Minutes of Exercise per Session: 30 min  Stress: No Stress Concern Present   Feeling of Stress : Not at all  Social Connections: Socially  Integrated   Frequency of Communication with Friends and Family: More than three times a week   Frequency of Social Gatherings with Friends and Family: More than three times a week   Attends Religious Services: More than 4 times per year   Active Member of [employer] or Organizations: Yes   Attends Music therapist: More than 4 times per year   Marital Status: Married    Tobacco Counseling Counseling given: Not Answered   Clinical Intake:  Pre-visit preparation completed: Yes  Pain : No/denies pain Pain Score: 0-No pain     BMI - recorded: 23.58 Nutritional Status: BMI of 19-24  Normal Nutritional Risks: None Diabetes: Yes CBG done?: No Did pt. bring in CBG monitor from home?: No  How often do you need to have someone help you when you read instructions, pamphlets, or other written materials from your doctor or pharmacy?: 1 - Never What is the last grade level you completed in school?: GED  Diabetic? yes  Interpreter Needed?: No  Information entered by :: Lisette Abu, LPN   Activities of Daily Living In your present state of health, do you have any difficulty performing the following activities: 03/18/2020  Hearing? N  Vision? N  Difficulty concentrating or making decisions? N  Walking or climbing stairs? N  Dressing or bathing? N  Doing errands, shopping? N  Preparing Food and eating ? N  Using the Toilet? N  In the past six months, have you accidently leaked urine? N  Do you have problems with loss of bowel control? N  Managing your  Medications? N  Managing your Finances? N  Housekeeping or managing your Housekeeping? N  Some recent data might be hidden    Patient Care Team: [name], Evie Lacks, MD as PCP - General Penumalli, Earlean Polka, MD as Consulting Physician (Neurology)  Indicate any recent Medical Services you may have received from other than [facility] providers in the past year (date may be approximate).     Assessment:   This is a routine wellness examination for Tiffany Gibbs.  Hearing/Vision screen No exam data present  Dietary issues and exercise activities discussed: Current Exercise Habits: Home exercise routine, Type of exercise: walking, Time (Minutes): 30, Frequency (Times/Week): 5, Weekly Exercise (Minutes/Week): 150, Intensity: Moderate, Exercise limited by: None identified  Goals      Weight < 150 lb (68.04 kg)     May try diet plan / jenny craig or other or nutri systems;       Depression Screen PHQ 2/9 Scores 03/18/2020 08/17/2019 08/12/2017 10/01/2016 11/19/2014 03/28/2014  PHQ - 2 Score 0 0 0 0 0 0  PHQ- 9 Score - 0 3 - - -    Fall Risk Fall Risk  03/18/2020 08/17/2019 10/01/2016 04/30/2015 11/19/2014  Falls in the past year? 0 0 No Yes No  Number falls in past yr: 0 0 - 2 or more -  Injury with Fall? 0 0 - No -  Risk for fall due to : No Fall Risks - - Other (Comment) -  Risk for fall due to: Comment - - - trpis over objects -  Follow up Falls evaluation completed - - - -    FALL RISK PREVENTION PERTAINING TO THE HOME:  Any stairs in or around the home? Yes  If so, are there any without handrails? No  Home free of loose throw rugs in walkways, pet beds, electrical cords, etc? Yes  Adequate lighting in your home to reduce risk of  falls? Yes   ASSISTIVE DEVICES UTILIZED TO PREVENT FALLS:  Life alert? No  Use of a cane, walker or w/c? No  Grab bars in the bathroom? Yes  Shower chair or bench in shower? Yes  Elevated toilet seat or a handicapped toilet? Yes   TIMED UP AND GO:  Was the test  performed? No .  Length of time to ambulate 10 feet: 0 sec.   Gait steady and fast without use of assistive device  Cognitive Function: Normal cognitive status assessed by direct observation by this Nurse Health Advisor. No abnormalities found.   MMSE - Mini Mental State Exam 11/19/2014  Not completed: (No Data)        Immunizations Immunization History  Administered Date(s) Administered   Fluad Quad(high Dose 65+) 09/14/2018, 11/20/2019   Influenza Split 11/03/2010, 12/30/2011   Influenza Whole 10/16/2008, 10/08/2009   Influenza, High Dose Seasonal PF 09/25/2015, 10/01/2016, 10/13/2017   Influenza,inj,Quad PF,6+ Mos 10/13/2012, 08/30/2013, 10/15/2014   Moderna Sars-Covid-2 Vaccination 02/03/2019, 03/05/2019, 09/07/2019   Pneumococcal Conjugate-13 08/30/2013   Pneumococcal Polysaccharide-23 11/03/2010   Td 02/25/2011   Zoster Recombinat (Shingrix) 11/10/2017, 02/20/2020    TDAP status: Up to date  Flu Vaccine status: Up to date  Pneumococcal vaccine status: Up to date  Covid-19 vaccine status: Completed vaccines  Qualifies for Shingles Vaccine? Yes   Zostavax completed Yes   Shingrix Completed?: Yes  Screening Tests Health Maintenance  Topic Date Due   OPHTHALMOLOGY EXAM  05/29/2018   FOOT EXAM  05/12/2019   MAMMOGRAM  10/14/2019   COLONOSCOPY (Pts 45-29yr Insurance coverage will need to be confirmed)  09/08/2020   HEMOGLOBIN A1C  09/15/2020   TETANUS/TDAP  02/24/2021   URINE MICROALBUMIN  03/15/2021   INFLUENZA VACCINE  Completed   DEXA SCAN  Completed   COVID-19 Vaccine  Completed   Hepatitis C Screening  Completed   PNA vac Low Risk Adult  Completed   HPV VACCINES  Aged Out    Health Maintenance  Health Maintenance Due  Topic Date Due   OPHTHALMOLOGY EXAM  05/29/2018   FOOT EXAM  05/12/2019   MAMMOGRAM  10/14/2019    Colorectal cancer screening: Type of screening: Colonoscopy. Completed 09/09/2015. Repeat every 5 years  Mammogram status:  Completed 11/27/2019. Repeat every year  Bone Density status: Completed 09/25/2015. Results reflect: Bone density results: OSTEOPENIA. Repeat every 2 years.  Lung Cancer Screening: (Low Dose CT Chest recommended if Age 75-80years, 30 pack-year currently smoking OR have quit w/in 15years.) does qualify.   Lung Cancer Screening Referral: no  Additional Screening:  Hepatitis C Screening: does qualify; Completed yes  Vision Screening: Recommended annual ophthalmology exams for early detection of glaucoma and other disorders of the eye. Is the patient up to date with their annual eye exam?  Yes  Who is the provider or what is the name of the office in which the patient attends annual eye exams? [facility]. If pt is not established with a provider, would they like to be referred to a provider to establish care? No .   Dental Screening: Recommended annual dental exams for proper oral hygiene  Community Resource Referral / Chronic Care Management: CRR required this visit?  No   CCM required this visit?  No      Plan:     I have personally reviewed and noted the following in the patient's chart:   Medical and social history Use of alcohol, tobacco or illicit drugs  Current medications and supplements Functional  ability and status Nutritional status Physical activity Advanced directives List of other physicians Hospitalizations, surgeries, and ER visits in previous 12 months Vitals Screenings to include cognitive, depression, and falls Referrals and appointments  In addition, I have reviewed and discussed with patient certain preventive protocols, quality metrics, and best practice recommendations. A written personalized care plan for preventive services as well as general preventive health recommendations were provided to patient.     Sheral Flow, LPN   05/12/8333   Nurse Notes:  Medications reviewed with patient; no opioid use noted.   Medical screening  examination/treatment/procedure(s) were performed by non-physician practitioner and as supervising physician I was immediately available for consultation/collaboration.  I agree with above. Lew Dawes, MD

## 2020-03-18 NOTE — Progress Notes (Signed)
Subjective:  Patient ID: Tiffany Gibbs, female    DOB: 07/29/1945  Age: 75 y.o. MRN: [id number]  CC: Follow-up (3 month f/u)   HPI Tiffany Gibbs presents for B12 def, glucose intolerance, depression f/u - stress w/husband  Outpatient Medications Prior to Visit  Medication Sig Dispense Refill  . Blood Glucose Monitoring Suppl (ONETOUCH VERIO) w/Device KIT Use to check blood sugar daily. DX E11.9 1 kit 0  . ciclopirox (PENLAC) 8 % solution APPLY OVER NAIL AND SURROUNDING SKIN AT BEDTIME OVER PREVIOUS COAT. AFTER SEVEN DAYS, MAY REMOVE WITH ALCOHOL AND CONTINUE CYCLE 6.6 mL 0  . Cyanocobalamin (B-12) 5000 MCG CAPS Take 1 tablet by mouth once a week.     . diphenhydrAMINE (BENADRYL) 25 mg capsule Take 25 mg by mouth daily as needed (allergies).     . fluticasone (FLONASE) 50 MCG/ACT nasal spray Place 1 spray into both nostrils daily as needed for allergies or rhinitis.    . fluticasone (FLONASE) 50 MCG/ACT nasal spray Place 2 sprays into both nostrils daily. 16 g 6  . glucose blood (ONETOUCH VERIO) test strip Use to check blood sugar daily. DX E11.9 200 each 3  . loratadine (CLARITIN) 10 MG tablet Take 1 tablet (10 mg total) by mouth daily. 30 tablet 11  . Magnesium 400 MG TABS Take 1 tablet by mouth daily. 100 tablet 3  . magnesium oxide (MAG-OX) 400 MG tablet Take 400 mg by mouth daily.    . metFORMIN (GLUCOPHAGE) 500 MG tablet TAKE 1 TABLET BY MOUTH TWICE A DAY WITH A MEAL 180 tablet 2  . Omega-3 Fatty Acids (FISH OIL) 1200 MG CAPS Take 1 capsule by mouth in the morning and at bedtime.    [name] Lancets 78G MISC 1 each by Does not apply route in the morning and at bedtime. DX: E11.9 200 each 2  . potassium chloride (KLOR-CON) 8 MEQ tablet TAKE ONE TABLET BY MOUTH TWICE DAILY  180 tablet 3  . Probiotic Product (DIGESTIVE ADVANTAGE GUMMIES PO) Take 1 each by mouth in the morning and at bedtime.    . Probiotic Product (PROBIOTIC PO) Take by mouth 2 (two) times  daily.    . Turmeric Curcumin 500 MG CAPS Take 1 capsule in the morning and 2 at bedtime    . venlafaxine XR (EFFEXOR XR) 37.5 MG 24 hr capsule Take 1 capsule (37.5 mg total) by mouth daily with breakfast. 30 capsule 5   No facility-administered medications prior to visit.    ROS: Review of Systems  Constitutional: Negative for activity change, appetite change, chills, fatigue and unexpected weight change.  HENT: Negative for congestion, mouth sores and sinus pressure.   Eyes: Negative for visual disturbance.  Respiratory: Negative for cough and chest tightness.   Gastrointestinal: Negative for abdominal pain and nausea.  Genitourinary: Negative for difficulty urinating, frequency and vaginal pain.  Musculoskeletal: Negative for back pain and gait problem.  Skin: Negative for pallor and rash.  Neurological: Negative for dizziness, tremors, weakness, numbness and headaches.  Psychiatric/Behavioral: Negative for confusion and sleep disturbance. The patient is nervous/anxious.     Objective:  BP 116/80 (BP Location: Left Arm)   Pulse 64   Temp 98.4 F (36.9 C) (Oral)   Ht _0  (1.626 m)   Wt 137 lb 6.4 oz (62.3 kg)   SpO2 99%   BMI 23.58 kg/m   BP Readings from Last 3 Encounters:  03/18/20 116/80  03/18/20 116/80  01/06/20 126/80  Wt Readings from Last 3 Encounters:  03/18/20 137 lb 6.4 oz (62.3 kg)  03/18/20 137 lb 6.4 oz (62.3 kg)  01/06/20 140 lb (63.5 kg)    Physical Exam Constitutional:      General: She is not in acute distress.    Appearance: She is well-developed.  HENT:     Head: Normocephalic.     Right Ear: External ear normal.     Left Ear: External ear normal.     Nose: Nose normal.     Mouth/Throat:     Mouth: Oropharynx is clear and moist.  Eyes:     General:        Right eye: No discharge.        Left eye: No discharge.     Conjunctiva/sclera: Conjunctivae normal.     Pupils: Pupils are equal, round, and reactive to light.  Neck:      Thyroid: No thyromegaly.     Vascular: No JVD.     Trachea: No tracheal deviation.  Cardiovascular:     Rate and Rhythm: Normal rate and regular rhythm.     Heart sounds: Normal heart sounds.  Pulmonary:     Effort: No respiratory distress.     Breath sounds: No stridor. No wheezing.  Abdominal:     General: Bowel sounds are normal. There is no distension.     Palpations: Abdomen is soft. There is no mass.     Tenderness: There is no abdominal tenderness. There is no guarding or rebound.  Musculoskeletal:        General: No tenderness or edema.     Cervical back: Normal range of motion and neck supple.  Lymphadenopathy:     Cervical: No cervical adenopathy.  Skin:    Findings: No erythema or rash.  Neurological:     Mental Status: She is oriented to person, place, and time.     Cranial Nerves: No cranial nerve deficit.     Motor: No abnormal muscle tone.     Coordination: Coordination normal.     Deep Tendon Reflexes: Reflexes normal.  Psychiatric:        Mood and Affect: Mood and affect normal.        Behavior: Behavior normal.        Thought Content: Thought content normal.        Judgment: Judgment normal.     Lab Results  Component Value Date   WBC 5.9 01/06/2020   HGB 13.8 01/06/2020   HCT 41.5 01/06/2020   PLT 215 01/06/2020   GLUCOSE 90 03/15/2020   GLUCOSE 90 03/15/2020   CHOL 199 03/15/2020   TRIG 57.0 03/15/2020   HDL 69.40 03/15/2020   LDLDIRECT 148.2 02/03/2013   LDLCALC 119 (H) 03/15/2020   ALT 12 03/15/2020   AST 15 03/15/2020   NA 142 03/15/2020   NA 142 03/15/2020   K 3.7 03/15/2020   K 3.7 03/15/2020   CL 106 03/15/2020   CL 106 03/15/2020   CREATININE 0.86 03/15/2020   CREATININE 0.86 03/15/2020   BUN 23 03/15/2020   BUN 23 03/15/2020   CO2 26 03/15/2020   CO2 26 03/15/2020   TSH 0.79 01/19/2019   INR 1.0 10/28/2018   HGBA1C 5.3 03/15/2020   MICROALBUR <0.7 03/15/2020    US Venous Img Lower  Left (DVT Study)  Result Date:  01/06/2020 CLINICAL DATA:  Left calf pain EXAM: LEFT LOWER EXTREMITY VENOUS DOPPLER ULTRASOUND TECHNIQUE: Gray-scale sonography with graded compression, as well  as color Doppler and duplex ultrasound were performed to evaluate the lower extremity deep venous systems from the level of the common femoral vein and including the common femoral, femoral, profunda femoral, popliteal and calf veins including the posterior tibial, peroneal and gastrocnemius veins when visible. The superficial great saphenous vein was also interrogated. Spectral Doppler was utilized to evaluate flow at rest and with distal augmentation maneuvers in the common femoral, femoral and popliteal veins. COMPARISON:  None. FINDINGS: Contralateral Common Femoral Vein: Respiratory phasicity is normal and symmetric with the symptomatic side. No evidence of thrombus. Normal compressibility. Common Femoral Vein: No evidence of thrombus. Normal compressibility, respiratory phasicity and response to augmentation. Saphenofemoral Junction: No evidence of thrombus. Normal compressibility and flow on color Doppler imaging. Profunda Femoral Vein: No evidence of thrombus. Normal compressibility and flow on color Doppler imaging. Femoral Vein: No evidence of thrombus. Normal compressibility, respiratory phasicity and response to augmentation. Popliteal Vein: No evidence of thrombus. Normal compressibility, respiratory phasicity and response to augmentation. Calf Veins: No evidence of thrombus. Normal compressibility and flow on color Doppler imaging. Superficial Great Saphenous Vein: No evidence of thrombus. Normal compressibility. Venous Reflux:  None. Other Findings:  None. IMPRESSION: No evidence of left lower extremity deep venous thrombosis. Electronically Signed   By: Davina Poke D.O.   On: 01/06/2020 13:25    Assessment & Plan:    Follow-up: No follow-ups on file.  Walker Kehr, MD

## 2020-03-18 NOTE — Assessment & Plan Note (Signed)
[facility] [place] for memory

## 2020-03-18 NOTE — Assessment & Plan Note (Signed)
Furosemide prn 

## 2020-03-18 NOTE — Assessment & Plan Note (Signed)
On B12 

## 2020-03-18 NOTE — Assessment & Plan Note (Signed)
Increase Effexor to 75 mg/d if tolerated

## 2020-03-18 NOTE — Patient Instructions (Signed)
[name] , Thank you for taking time to come for your Medicare Wellness Visit. I appreciate your ongoing commitment to your health goals. Please review the following plan we discussed and let me know if I can assist you in the future.   Screening recommendations/referrals: Colonoscopy: 09/09/2015; due every 5 years Mammogram: 11/27/2019 Bone Density: 09/25/2015; due every 2 years Recommended yearly ophthalmology/optometry visit for glaucoma screening and checkup Recommended yearly dental visit for hygiene and checkup  Vaccinations: Influenza vaccine: 11/20/2019 Pneumococcal vaccine: 11/03/2010, 08/20/2013 Tdap vaccine: 02/25/2011; due every 10 years Shingles vaccine: 11/10/2017, 02/20/2020 Covid-19: 02/03/2019, 03/05/2019, 09/07/2019, 03/06/2020  Advanced directives: Please bring a copy of your health care power of attorney and living will to the office at your convenience.  Conditions/risks identified: Yes; Reviewed health maintenance screenings with patient today and relevant education, vaccines, and/or referrals were provided. Please continue to do your personal lifestyle choices by: daily care of teeth and gums, regular physical activity (goal should be 5 days a week for 30 minutes), eat a healthy diet, avoid tobacco and drug use, limiting any alcohol intake, taking a low-dose aspirin (if not allergic or have been advised by your provider otherwise) and taking vitamins and minerals as recommended by your provider. Continue doing brain stimulating activities (puzzles, reading, adult coloring books, staying active) to keep memory sharp. Continue to eat heart healthy diet (full of fruits, vegetables, whole grains, lean protein, water--limit salt, fat, and sugar intake) and increase physical activity as tolerated.  Next appointment: Please schedule your next Medicare Wellness Visit with your Nurse Health Advisor in 1 year by calling [phone].  Preventive Care 21 Years and Older, Female Preventive  care refers to lifestyle choices and visits with your health care provider that can promote health and wellness. What does preventive care include?  A yearly physical exam. This is also called an annual well check.  Dental exams once or twice a year.  Routine eye exams. Ask your health care provider how often you should have your eyes checked.  Personal lifestyle choices, including:  Daily care of your teeth and gums.  Regular physical activity.  Eating a healthy diet.  Avoiding tobacco and drug use.  Limiting alcohol use.  Practicing safe sex.  Taking low-dose aspirin every day.  Taking vitamin and mineral supplements as recommended by your health care provider. What happens during an annual well check? The services and screenings done by your health care provider during your annual well check will depend on your age, overall health, lifestyle risk factors, and family history of disease. Counseling  Your health care provider may ask you questions about your:  Alcohol use.  Tobacco use.  Drug use.  Emotional well-being.  Home and relationship well-being.  Sexual activity.  Eating habits.  History of falls.  Memory and ability to understand (cognition).  Work and work Statistician.  Reproductive health. Screening  You may have the following tests or measurements:  Height, weight, and BMI.  Blood pressure.  Lipid and cholesterol levels. These may be checked every 5 years, or more frequently if you are over 97 years old.  Skin check.  Lung cancer screening. You may have this screening every year starting at age 83 if you have a 30-pack-year history of smoking and currently smoke or have quit within the past 15 years.  Fecal occult blood test (FOBT) of the stool. You may have this test every year starting at age 89.  Flexible sigmoidoscopy or colonoscopy. You may have a sigmoidoscopy every  5 years or a colonoscopy every 10 years starting at age  88.  Hepatitis C blood test.  Hepatitis B blood test.  Sexually transmitted disease (STD) testing.  Diabetes screening. This is done by checking your blood sugar (glucose) after you have not eaten for a while (fasting). You may have this done every 1-3 years.  Bone density scan. This is done to screen for osteoporosis. You may have this done starting at age 6.  Mammogram. This may be done every 1-2 years. Talk to your health care provider about how often you should have regular mammograms. Talk with your health care provider about your test results, treatment options, and if necessary, the need for more tests. Vaccines  Your health care provider may recommend certain vaccines, such as:  Influenza vaccine. This is recommended every year.  Tetanus, diphtheria, and acellular pertussis (Tdap, Td) vaccine. You may need a Td booster every 10 years.  Zoster vaccine. You may need this after age 46.  Pneumococcal 13-valent conjugate (PCV13) vaccine. One dose is recommended after age 32.  Pneumococcal polysaccharide (PPSV23) vaccine. One dose is recommended after age 59. Talk to your health care provider about which screenings and vaccines you need and how often you need them. This information is not intended to replace advice given to you by your health care provider. Make sure you discuss any questions you have with your health care provider. Document Released: 01/25/2015 Document Revised: 09/18/2015 Document Reviewed: 10/30/2014 Elsevier Interactive Patient Education  2017 [place] Prevention in the Home Falls can cause injuries. They can happen to people of all ages. There are many things you can do to make your home safe and to help prevent falls. What can I do on the outside of my home?  Regularly fix the edges of walkways and driveways and fix any cracks.  Remove anything that might make you trip as you walk through a door, such as a raised step or threshold.  Trim  any bushes or trees on the path to your home.  Use bright outdoor lighting.  Clear any walking paths of anything that might make someone trip, such as rocks or tools.  Regularly check to see if handrails are loose or broken. Make sure that both sides of any steps have handrails.  Any raised decks and porches should have guardrails on the edges.  Have any leaves, snow, or ice cleared regularly.  Use sand or salt on walking paths during winter.  Clean up any spills in your garage right away. This includes oil or grease spills. What can I do in the bathroom?  Use night lights.  Install grab bars by the toilet and in the tub and shower. Do not use towel bars as grab bars.  Use non-skid mats or decals in the tub or shower.  If you need to sit down in the shower, use a plastic, non-slip stool.  Keep the floor dry. Clean up any water that spills on the floor as soon as it happens.  Remove soap buildup in the tub or shower regularly.  Attach bath mats securely with double-sided non-slip rug tape.  Do not have throw rugs and other things on the floor that can make you trip. What can I do in the bedroom?  Use night lights.  Make sure that you have a light by your bed that is easy to reach.  Do not use any sheets or blankets that are too big for your bed. They should not  hang down onto the floor.  Have a firm chair that has side arms. You can use this for support while you get dressed.  Do not have throw rugs and other things on the floor that can make you trip. What can I do in the kitchen?  Clean up any spills right away.  Avoid walking on wet floors.  Keep items that you use a lot in easy-to-reach places.  If you need to reach something above you, use a strong step stool that has a grab bar.  Keep electrical cords out of the way.  Do not use floor polish or wax that makes floors slippery. If you must use wax, use non-skid floor wax.  Do not have throw rugs and other  things on the floor that can make you trip. What can I do with my stairs?  Do not leave any items on the stairs.  Make sure that there are handrails on both sides of the stairs and use them. Fix handrails that are broken or loose. Make sure that handrails are as long as the stairways.  Check any carpeting to make sure that it is firmly attached to the stairs. Fix any carpet that is loose or worn.  Avoid having throw rugs at the top or bottom of the stairs. If you do have throw rugs, attach them to the floor with carpet tape.  Make sure that you have a light switch at the top of the stairs and the bottom of the stairs. If you do not have them, ask someone to add them for you. What else can I do to help prevent falls?  Wear shoes that:  Do not have high heels.  Have rubber bottoms.  Are comfortable and fit you well.  Are closed at the toe. Do not wear sandals.  If you use a stepladder:  Make sure that it is fully opened. Do not climb a closed stepladder.  Make sure that both sides of the stepladder are locked into place.  Ask someone to hold it for you, if possible.  Clearly mark and make sure that you can see:  Any grab bars or handrails.  First and last steps.  Where the edge of each step is.  Use tools that help you move around (mobility aids) if they are needed. These include:  Canes.  Walkers.  Scooters.  Crutches.  Turn on the lights when you go into a dark area. Replace any light bulbs as soon as they burn out.  Set up your furniture so you have a clear path. Avoid moving your furniture around.  If any of your floors are uneven, fix them.  If there are any pets around you, be aware of where they are.  Review your medicines with your doctor. Some medicines can make you feel dizzy. This can increase your chance of falling. Ask your doctor what other things that you can do to help prevent falls. This information is not intended to replace advice given to  you by your health care provider. Make sure you discuss any questions you have with your health care provider. Document Released: 10/25/2008 Document Revised: 06/06/2015 Document Reviewed: 02/02/2014 Elsevier Interactive Patient Education  2017 [employer].

## 2020-03-18 NOTE — Assessment & Plan Note (Signed)
On Metformin 

## 2020-03-18 NOTE — Assessment & Plan Note (Signed)
[place] in 7/22

## 2020-03-18 NOTE — Assessment & Plan Note (Signed)
Off Phentermine

## 2020-03-19 ENCOUNTER — Telehealth: Payer: Self-pay | Admitting: *Deleted

## 2020-03-19 NOTE — Telephone Encounter (Signed)
Rec'd PA for pt Venlafaxine completed vis cover-my-meds w/ (Key: [id number]). Rec'd msg stating " Your information has been submitted to [employer]. [employer] Part D will review the request and will issue a decision, typically within 1-3 days from your submission...Johny Chess

## 2020-03-19 NOTE — Telephone Encounter (Signed)
Rec'd PA back med has been approved w/approval dates 01/13/2020 thru 01/11/21. Faxed approval to pof.Marland KitchenLind Guest

## 2020-03-25 ENCOUNTER — Telehealth: Payer: Self-pay | Admitting: Internal Medicine

## 2020-03-25 NOTE — Telephone Encounter (Signed)
Do not understand msg.. per chart renal test order and appt made for 03/29/20.closing encounter...Tiffany Gibbs

## 2020-03-25 NOTE — Telephone Encounter (Signed)
Patient's Renal scan has been entered into the system but patient wrote a message on it stating:  "The nurse on the phone that I asked to give you the message said the MRI would expire on the 14th of July. I'm not sure what she was referring to but thought I should mention it to you"  The date at the top of the paper also says 7.21.2021 and patient believes it should be 7.21.2022

## 2020-03-26 ENCOUNTER — Telehealth: Payer: Self-pay | Admitting: Internal Medicine

## 2020-03-26 NOTE — Telephone Encounter (Signed)
We cannot treat facial swelling with a fluid pill. She will need visit and I recommend today.

## 2020-03-26 NOTE — Telephone Encounter (Signed)
MD is out of the office pls advise../lmb 

## 2020-03-26 NOTE — Telephone Encounter (Signed)
   Patient requesting a diuretic be called to local pharmacy . She states she has facial swelling.  Declined appointment

## 2020-03-27 NOTE — Telephone Encounter (Signed)
Patient declined appt. She said she was using ice packs.

## 2020-03-27 NOTE — Telephone Encounter (Signed)
Called pt there was no answer LMOM w/MD response../lmb 

## 2020-03-28 NOTE — Telephone Encounter (Signed)
We have ordered an ultrasound.  Thanks

## 2020-03-29 ENCOUNTER — Ambulatory Visit
Admission: RE | Admit: 2020-03-29 | Discharge: 2020-03-29 | Disposition: A | Discharge status: Home / Self Care | Source: Ambulatory Visit | Attending: Internal Medicine | Admitting: Internal Medicine

## 2020-03-29 DIAGNOSIS — N281 Cyst of kidney, acquired: Secondary | ICD-10-CM | POA: Diagnosis not present

## 2020-04-01 ENCOUNTER — Telehealth: Payer: Self-pay | Admitting: Internal Medicine

## 2020-04-01 NOTE — Telephone Encounter (Signed)
Patient seeking advice regarding magnesium  She is currently taking Dr Luvenia Heller Magnesium chelated. She would like to know should she continue taking.  Please advise

## 2020-04-01 NOTE — Telephone Encounter (Signed)
Rec'd fax from pharmacy stating the insurance will not cover the Venlafaxine Er 37.5 mg two capsule a day. The max is 1 capsule a day. Can send in a 75 mg capsule if MD agreed.Marland KitchenJohny Chess

## 2020-04-02 NOTE — Telephone Encounter (Signed)
Patient is requesting a call back. She can be reached at [phone].

## 2020-04-03 NOTE — Telephone Encounter (Signed)
Ok Effexor XR 75 mg po qd #90 w/1 ref Corrin Parker

## 2020-04-04 NOTE — Telephone Encounter (Signed)
Patient wondering about the magnesium still. Please call patient back

## 2020-04-05 NOTE — Telephone Encounter (Signed)
Okay to continue to take magnesium.  Thanks

## 2020-04-05 NOTE — Telephone Encounter (Signed)
Notified pt w/MD response.../lmb 

## 2020-04-08 MED ORDER — FUROSEMIDE 20 MG PO TABS: 10.0000 mg | ORAL_TABLET | Freq: Every day | ORAL | 3 refills | Status: DC | PRN

## 2020-04-08 NOTE — Telephone Encounter (Signed)
Furosemide prescription emailed. Keep leg elevated. Please see me if problems. Thanks

## 2020-04-08 NOTE — Telephone Encounter (Signed)
Patient calling to report swollen ankle. She would like advice. Also request diuretic  Please advise

## 2020-04-08 NOTE — Addendum Note (Signed)
Addended by: Cassandria Anger on: 04/08/2020 10:55 PM   Modules accepted: Orders

## 2020-04-09 NOTE — Telephone Encounter (Signed)
Notified pt w/MD response.../lmb 

## 2020-04-17 ENCOUNTER — Other Ambulatory Visit: Payer: Self-pay

## 2020-04-17 ENCOUNTER — Ambulatory Visit (INDEPENDENT_AMBULATORY_CARE_PROVIDER_SITE_OTHER): Payer: Medicare HMO | Admitting: Internal Medicine

## 2020-04-17 ENCOUNTER — Encounter: Payer: Self-pay | Admitting: Internal Medicine

## 2020-04-17 ENCOUNTER — Ambulatory Visit (INDEPENDENT_AMBULATORY_CARE_PROVIDER_SITE_OTHER): Payer: Medicare HMO

## 2020-04-17 VITALS — BP 120/70 | HR 96 | Temp 98.2°F | Ht 64.0 in | Wt 135.8 lb

## 2020-04-17 DIAGNOSIS — I10 Essential (primary) hypertension: Secondary | ICD-10-CM

## 2020-04-17 DIAGNOSIS — R600 Localized edema: Secondary | ICD-10-CM

## 2020-04-17 DIAGNOSIS — E6609 Other obesity due to excess calories: Secondary | ICD-10-CM | POA: Diagnosis not present

## 2020-04-17 DIAGNOSIS — M25571 Pain in right ankle and joints of right foot: Secondary | ICD-10-CM

## 2020-04-17 DIAGNOSIS — M79669 Pain in unspecified lower leg: Secondary | ICD-10-CM

## 2020-04-17 DIAGNOSIS — M1610 Unilateral primary osteoarthritis, unspecified hip: Secondary | ICD-10-CM | POA: Diagnosis not present

## 2020-04-17 DIAGNOSIS — Z6834 Body mass index (BMI) 34.0-34.9, adult: Secondary | ICD-10-CM | POA: Diagnosis not present

## 2020-04-17 DIAGNOSIS — E119 Type 2 diabetes mellitus without complications: Secondary | ICD-10-CM

## 2020-04-17 DIAGNOSIS — M7731 Calcaneal spur, right foot: Secondary | ICD-10-CM | POA: Diagnosis not present

## 2020-04-17 DIAGNOSIS — G8929 Other chronic pain: Secondary | ICD-10-CM | POA: Diagnosis not present

## 2020-04-17 DIAGNOSIS — N281 Cyst of kidney, acquired: Secondary | ICD-10-CM | POA: Diagnosis not present

## 2020-04-17 LAB — COMPREHENSIVE METABOLIC PANEL
ALT: 14 U/L (ref 0–35)
AST: 17 U/L (ref 0–37)
Albumin: 4.7 g/dL (ref 3.5–5.2)
Alkaline Phosphatase: 67 U/L (ref 39–117)
BUN: 18 mg/dL (ref 6–23)
CO2: 32 mEq/L (ref 19–32)
Calcium: 10.1 mg/dL (ref 8.4–10.5)
Chloride: 102 mEq/L (ref 96–112)
Creatinine, Ser: 1.07 mg/dL (ref 0.40–1.20)
GFR: 51.07 mL/min — ABNORMAL LOW (ref >60.00)
Glucose, Bld: 98 mg/dL (ref 70–99)
Potassium: 4.4 mEq/L (ref 3.5–5.1)
Sodium: 142 mEq/L (ref 135–145)
Total Bilirubin: 0.5 mg/dL (ref 0.2–1.2)
Total Protein: 7.5 g/dL (ref 6.0–8.3)

## 2020-04-17 LAB — SEDIMENTATION RATE: Sed Rate: 17 mm/hr (ref 0–30)

## 2020-04-17 LAB — MICROALBUMIN / CREATININE URINE RATIO
Creatinine,U: 89.7 mg/dL
Microalb Creat Ratio: 1.8 mg/g (ref 0.0–30.0)
Microalb, Ur: 1.6 mg/dL (ref 0.0–1.9)

## 2020-04-17 LAB — HEMOGLOBIN A1C: Hgb A1c MFr Bld: 5.5 % (ref 4.6–6.5)

## 2020-04-17 LAB — TSH: TSH: 1.37 u[IU]/mL (ref 0.35–4.50)

## 2020-04-17 LAB — URIC ACID: Uric Acid, Serum: 6.5 mg/dL (ref 2.4–7.0)

## 2020-04-17 NOTE — Assessment & Plan Note (Addendum)
Compr sock Furosemide prn  The pt will see a podiatrist in [place]

## 2020-04-17 NOTE — Assessment & Plan Note (Signed)
Furosemide prn  Nl BP at home

## 2020-04-17 NOTE — Assessment & Plan Note (Signed)
R ankle X ray Ankle brace - elastic Uric acid, ESR

## 2020-04-17 NOTE — Progress Notes (Signed)
Subjective:  Patient ID: Tiffany Gibbs, female    DOB: [birth year]  Age: 75 y.o. MRN: [id number]  CC: Joint Swelling (Swelling (R) ankle)   HPI Tiffany Gibbs presents for HTN C/o R ankle, lower leg swelling, numbness  Outpatient Medications Prior to Visit  Medication Sig Dispense Refill  . Blood Glucose Monitoring Suppl (ONETOUCH VERIO) w/Device KIT Use to check blood sugar daily. DX E11.9 1 kit 0  . ciclopirox (PENLAC) 8 % solution APPLY OVER NAIL AND SURROUNDING SKIN AT BEDTIME OVER PREVIOUS COAT. AFTER SEVEN DAYS, MAY REMOVE WITH ALCOHOL AND CONTINUE CYCLE 6.6 mL 0  . Cyanocobalamin (B-12) 5000 MCG CAPS Take 1 tablet by mouth once a week.     . diphenhydrAMINE (BENADRYL) 25 mg capsule Take 25 mg by mouth daily as needed (allergies).     . fluticasone (FLONASE) 50 MCG/ACT nasal spray Place 2 sprays into both nostrils daily. 16 g 6  . furosemide (LASIX) 20 MG tablet Take 0.5-1 tablets (10-20 mg total) by mouth daily as needed for edema. 30 tablet 3  . glucose blood (ONETOUCH VERIO) test strip Use to check blood sugar daily. DX E11.9 200 each 3  . loratadine (CLARITIN) 10 MG tablet Take 1 tablet (10 mg total) by mouth daily. 30 tablet 11  . magnesium oxide (MAG-OX) 400 MG tablet Take 400 mg by mouth daily.    . metFORMIN (GLUCOPHAGE) 500 MG tablet TAKE 1 TABLET BY MOUTH TWICE A DAY WITH A MEAL 180 tablet 2  . Omega-3 Fatty Acids (FISH OIL) 1200 MG CAPS Take 1 capsule by mouth in the morning and at bedtime.    [name] Lancets 92Z MISC USE 1  TO CHECK GLUCOSE IN THE MORNING AND 1 AT BEDTIME 200 each 1  . potassium chloride (KLOR-CON) 8 MEQ tablet TAKE ONE TABLET BY MOUTH TWICE DAILY  180 tablet 3  . Turmeric Curcumin 500 MG CAPS Take 1 capsule in the morning and 2 at bedtime    . venlafaxine XR (EFFEXOR XR) 37.5 MG 24 hr capsule Take 2 capsules (75 mg total) by mouth daily with breakfast. 180 capsule 1   No facility-administered medications prior to visit.     ROS: Review of Systems  Constitutional: Negative for activity change, appetite change, chills, fatigue and unexpected weight change.  HENT: Negative for congestion, mouth sores and sinus pressure.   Eyes: Negative for visual disturbance.  Respiratory: Negative for cough and chest tightness.   Cardiovascular: Positive for leg swelling.  Gastrointestinal: Negative for abdominal pain and nausea.  Genitourinary: Negative for difficulty urinating, frequency and vaginal pain.  Musculoskeletal: Positive for arthralgias and gait problem. Negative for back pain.  Skin: Negative for pallor and rash.  Neurological: Negative for dizziness, tremors, weakness, numbness and headaches.  Psychiatric/Behavioral: Negative for confusion and sleep disturbance.    Objective:  BP 120/70 (BP Location: Left Arm)   Pulse 96   Temp 98.2 F (36.8 C) (Oral)   Ht 5' 4" (1.626 m)   Wt 135 lb 12.8 oz (61.6 kg)   SpO2 96%   BMI 23.31 kg/m   BP Readings from Last 3 Encounters:  04/17/20 120/70  03/18/20 116/80  03/18/20 116/80    Wt Readings from Last 3 Encounters:  04/17/20 135 lb 12.8 oz (61.6 kg)  03/18/20 137 lb 6.4 oz (62.3 kg)  03/18/20 137 lb 6.4 oz (62.3 kg)    Physical Exam Constitutional:      General: She is not in acute  distress.    Appearance: She is well-developed.  HENT:     Head: Normocephalic.     Right Ear: External ear normal.     Left Ear: External ear normal.     Nose: Nose normal.  Eyes:     General:        Right eye: No discharge.        Left eye: No discharge.     Conjunctiva/sclera: Conjunctivae normal.     Pupils: Pupils are equal, round, and reactive to light.  Neck:     Thyroid: No thyromegaly.     Vascular: No JVD.     Trachea: No tracheal deviation.  Cardiovascular:     Rate and Rhythm: Normal rate and regular rhythm.     Heart sounds: Normal heart sounds.  Pulmonary:     Effort: No respiratory distress.     Breath sounds: No stridor. No wheezing.   Abdominal:     General: Bowel sounds are normal. There is no distension.     Palpations: Abdomen is soft. There is no mass.     Tenderness: There is no abdominal tenderness. There is no guarding or rebound.  Musculoskeletal:        General: Tenderness present.     Cervical back: Normal range of motion and neck supple.     Right lower leg: Edema present.  Lymphadenopathy:     Cervical: No cervical adenopathy.  Skin:    Findings: No erythema or rash.  Neurological:     Cranial Nerves: No cranial nerve deficit.     Motor: No abnormal muscle tone.     Coordination: Coordination normal.     Deep Tendon Reflexes: Reflexes normal.  Psychiatric:        Behavior: Behavior normal.        Thought Content: Thought content normal.        Judgment: Judgment normal.     R lower leg/ankle w/trace edema, decr sensory exam   Lab Results  Component Value Date   WBC 5.9 01/06/2020   HGB 13.8 01/06/2020   HCT 41.5 01/06/2020   PLT 215 01/06/2020   GLUCOSE 90 03/15/2020   GLUCOSE 90 03/15/2020   CHOL 199 03/15/2020   TRIG 57.0 03/15/2020   HDL 69.40 03/15/2020   LDLDIRECT 148.2 02/03/2013   LDLCALC 119 (H) 03/15/2020   ALT 12 03/15/2020   AST 15 03/15/2020   NA 142 03/15/2020   NA 142 03/15/2020   K 3.7 03/15/2020   K 3.7 03/15/2020   CL 106 03/15/2020   CL 106 03/15/2020   CREATININE 0.86 03/15/2020   CREATININE 0.86 03/15/2020   BUN 23 03/15/2020   BUN 23 03/15/2020   CO2 26 03/15/2020   CO2 26 03/15/2020   TSH 0.79 01/19/2019   INR 1.0 10/28/2018   HGBA1C 5.3 03/15/2020   MICROALBUR <0.7 03/15/2020    US RENAL  Result Date: 04/01/2020 CLINICAL DATA:  Follow-up renal cysts. EXAM: RENAL / URINARY TRACT ULTRASOUND COMPLETE COMPARISON:  Renal ultrasound 08/02/2019 FINDINGS: Right Kidney: Renal measurements: 10.1 x 4.2 x 4.8 cm = volume: 104 ML. Right midpole cyst 3.2 x 3.8 x 3.4 cm, unchanged. Echogenicity within normal limits. No mass or hydronephrosis visualized. Left  Kidney: Renal measurements: 10.7 x 5.3 x 5.7 cm = volume: 167 mL. Midpole exophytic cyst laterally measures 2.1 x 2.0 x 2.7 cm. No interval change. Echogenicity within normal limits. No mass or hydronephrosis visualized. Bladder: Appears normal for degree of bladder distention. Other:  None. IMPRESSION: Bilateral renal cyst, stable from the prior ultrasound. No hydronephrosis. Electronically Signed   By: Franchot Gallo M.D.   On: 04/01/2020 10:40    Assessment & Plan:   There are no diagnoses linked to this encounter.   No orders of the defined types were placed in this encounter.    Follow-up: No follow-ups on file.  Walker Kehr, MD

## 2020-04-17 NOTE — Addendum Note (Signed)
Addended by: Boris Lown B on: 04/17/2020 02:41 PM   Modules accepted: Orders

## 2020-04-18 LAB — RHEUMATOID FACTOR: Rheumatoid fact SerPl-aCnc: <14 IU/mL (ref <14)

## 2020-04-22 ENCOUNTER — Telehealth: Payer: Self-pay | Admitting: Internal Medicine

## 2020-04-22 MED ORDER — VENLAFAXINE HCL ER 37.5 MG PO CP24: 75.0000 mg | ORAL_CAPSULE | Freq: Every day | ORAL | 1 refills | Status: DC

## 2020-04-22 NOTE — Telephone Encounter (Signed)
Patient requesting refill for venlafaxine XR (EFFEXOR XR) 37.5 MG 24 hr capsule  [facility] [address], [place]

## 2020-04-22 NOTE — Telephone Encounter (Signed)
Rx sent to [facility].Marland KitchenJohny Chess

## 2020-05-10 ENCOUNTER — Ambulatory Visit: Admitting: Sports Medicine

## 2020-05-30 ENCOUNTER — Encounter: Payer: Self-pay | Admitting: Internal Medicine

## 2020-05-30 ENCOUNTER — Ambulatory Visit (INDEPENDENT_AMBULATORY_CARE_PROVIDER_SITE_OTHER): Payer: Medicare HMO | Admitting: Internal Medicine

## 2020-05-30 ENCOUNTER — Other Ambulatory Visit: Payer: Self-pay

## 2020-05-30 VITALS — BP 138/78 | HR 77 | Temp 98.6°F | Ht 64.0 in | Wt 140.6 lb

## 2020-05-30 DIAGNOSIS — E119 Type 2 diabetes mellitus without complications: Secondary | ICD-10-CM

## 2020-05-30 DIAGNOSIS — I10 Essential (primary) hypertension: Secondary | ICD-10-CM | POA: Diagnosis not present

## 2020-05-30 DIAGNOSIS — F419 Anxiety disorder, unspecified: Secondary | ICD-10-CM

## 2020-05-30 DIAGNOSIS — E538 Deficiency of other specified B group vitamins: Secondary | ICD-10-CM

## 2020-05-30 DIAGNOSIS — G720 Drug-induced myopathy: Secondary | ICD-10-CM

## 2020-05-30 DIAGNOSIS — E785 Hyperlipidemia, unspecified: Secondary | ICD-10-CM

## 2020-05-30 DIAGNOSIS — Z23 Encounter for immunization: Secondary | ICD-10-CM | POA: Diagnosis not present

## 2020-05-30 DIAGNOSIS — T466X5A Adverse effect of antihyperlipidemic and antiarteriosclerotic drugs, initial encounter: Secondary | ICD-10-CM | POA: Diagnosis not present

## 2020-05-30 DIAGNOSIS — R69 Illness, unspecified: Secondary | ICD-10-CM | POA: Diagnosis not present

## 2020-05-30 DIAGNOSIS — N183 Chronic kidney disease, stage 3 unspecified: Secondary | ICD-10-CM

## 2020-05-30 NOTE — Assessment & Plan Note (Signed)
We can use Farxiga low dose for renal protection

## 2020-05-30 NOTE — Progress Notes (Signed)
Subjective:  Patient ID: Tiffany Gibbs, female    DOB: 04/22/1945  Age: 75 y.o. MRN: [id number]  CC: Follow-up (6 WEEKS F/U)   HPI Tiffany Gibbs presents for CRI, HTN, cramps  Outpatient Medications Prior to Visit  Medication Sig Dispense Refill  . Blood Glucose Monitoring Suppl (ONETOUCH VERIO) w/Device KIT Use to check blood sugar daily. DX E11.9 1 kit 0  . Cyanocobalamin (B-12) 5000 MCG CAPS Take 1 tablet by mouth once a week.     . diphenhydrAMINE (BENADRYL) 25 mg capsule Take 25 mg by mouth daily as needed (allergies).     . fluticasone (FLONASE) 50 MCG/ACT nasal spray Place 2 sprays into both nostrils daily. 16 g 6  . furosemide (LASIX) 20 MG tablet Take 0.5-1 tablets (10-20 mg total) by mouth daily as needed for edema. 30 tablet 3  . glucose blood (ONETOUCH VERIO) test strip Use to check blood sugar daily. DX E11.9 200 each 3  . loratadine (CLARITIN) 10 MG tablet Take 1 tablet (10 mg total) by mouth daily. 30 tablet 11  . magnesium oxide (MAG-OX) 400 MG tablet Take 400 mg by mouth daily.    . metFORMIN (GLUCOPHAGE) 500 MG tablet TAKE 1 TABLET BY MOUTH TWICE A DAY WITH A MEAL 180 tablet 2  . Omega-3 Fatty Acids (FISH OIL) 1200 MG CAPS Take 1 capsule by mouth in the morning and at bedtime.    [name] Lancets 74B MISC USE 1  TO CHECK GLUCOSE IN THE MORNING AND 1 AT BEDTIME 200 each 1  . potassium chloride (KLOR-CON) 8 MEQ tablet TAKE ONE TABLET BY MOUTH TWICE DAILY  180 tablet 3  . Turmeric Curcumin 500 MG CAPS Take 1 capsule in the morning and 2 at bedtime    . venlafaxine XR (EFFEXOR XR) 37.5 MG 24 hr capsule Take 2 capsules (75 mg total) by mouth daily with breakfast. 180 capsule 1  . ciclopirox (PENLAC) 8 % solution APPLY OVER NAIL AND SURROUNDING SKIN AT BEDTIME OVER PREVIOUS COAT. AFTER SEVEN DAYS, MAY REMOVE WITH ALCOHOL AND CONTINUE CYCLE (Patient not taking: Reported on 05/30/2020) 6.6 mL 0   No facility-administered medications prior to visit.     ROS: Review of Systems  Constitutional: Negative for activity change, appetite change, chills, fatigue and unexpected weight change.  HENT: Negative for congestion, mouth sores and sinus pressure.   Eyes: Negative for visual disturbance.  Respiratory: Negative for cough and chest tightness.   Gastrointestinal: Negative for abdominal pain and nausea.  Genitourinary: Negative for difficulty urinating, frequency and vaginal pain.  Musculoskeletal: Negative for back pain and gait problem.  Skin: Negative for pallor and rash.  Neurological: Negative for dizziness, tremors, weakness, numbness and headaches.  Psychiatric/Behavioral: Negative for confusion and sleep disturbance. The patient is nervous/anxious.     Objective:  BP 138/78 (BP Location: Left Arm)   Pulse 77   Temp 98.6 F (37 C) (Oral)   Ht $R'5\' 4"'wp$  (1.626 m)   Wt 140 lb 9.6 oz (63.8 kg)   SpO2 98%   BMI 24.13 kg/m   BP Readings from Last 3 Encounters:  05/30/20 138/78  04/17/20 120/70  03/18/20 116/80    Wt Readings from Last 3 Encounters:  05/30/20 140 lb 9.6 oz (63.8 kg)  04/17/20 135 lb 12.8 oz (61.6 kg)  03/18/20 137 lb 6.4 oz (62.3 kg)    Physical Exam Constitutional:      General: She is not in acute distress.  Appearance: She is well-developed.  HENT:     Head: Normocephalic.     Right Ear: External ear normal.     Left Ear: External ear normal.     Nose: Nose normal.  Eyes:     General:        Right eye: No discharge.        Left eye: No discharge.     Conjunctiva/sclera: Conjunctivae normal.     Pupils: Pupils are equal, round, and reactive to light.  Neck:     Thyroid: No thyromegaly.     Vascular: No JVD.     Trachea: No tracheal deviation.  Cardiovascular:     Rate and Rhythm: Normal rate and regular rhythm.     Heart sounds: Normal heart sounds.  Pulmonary:     Effort: No respiratory distress.     Breath sounds: No stridor. No wheezing.  Abdominal:     General: Bowel sounds are  normal. There is no distension.     Palpations: Abdomen is soft. There is no mass.     Tenderness: There is no abdominal tenderness. There is no guarding or rebound.  Musculoskeletal:        General: No tenderness.     Cervical back: Normal range of motion and neck supple.  Lymphadenopathy:     Cervical: No cervical adenopathy.  Skin:    Findings: No erythema or rash.  Neurological:     Mental Status: She is oriented to person, place, and time.     Cranial Nerves: No cranial nerve deficit.     Motor: No abnormal muscle tone.     Coordination: Coordination normal.     Deep Tendon Reflexes: Reflexes normal.  Psychiatric:        Behavior: Behavior normal.        Thought Content: Thought content normal.        Judgment: Judgment normal.     Lab Results  Component Value Date   WBC 5.9 01/06/2020   HGB 13.8 01/06/2020   HCT 41.5 01/06/2020   PLT 215 01/06/2020   GLUCOSE 98 04/17/2020   CHOL 199 03/15/2020   TRIG 57.0 03/15/2020   HDL 69.40 03/15/2020   LDLDIRECT 148.2 02/03/2013   LDLCALC 119 (H) 03/15/2020   ALT 14 04/17/2020   AST 17 04/17/2020   NA 142 04/17/2020   K 4.4 04/17/2020   CL 102 04/17/2020   CREATININE 1.07 04/17/2020   BUN 18 04/17/2020   CO2 32 04/17/2020   TSH 1.37 04/17/2020   INR 1.0 10/28/2018   HGBA1C 5.5 04/17/2020   MICROALBUR 1.6 04/17/2020    US RENAL  Result Date: 04/01/2020 CLINICAL DATA:  Follow-up renal cysts. EXAM: RENAL / URINARY TRACT ULTRASOUND COMPLETE COMPARISON:  Renal ultrasound 08/02/2019 FINDINGS: Right Kidney: Renal measurements: 10.1 x 4.2 x 4.8 cm = volume: 104 ML. Right midpole cyst 3.2 x 3.8 x 3.4 cm, unchanged. Echogenicity within normal limits. No mass or hydronephrosis visualized. Left Kidney: Renal measurements: 10.7 x 5.3 x 5.7 cm = volume: 167 mL. Midpole exophytic cyst laterally measures 2.1 x 2.0 x 2.7 cm. No interval change. Echogenicity within normal limits. No mass or hydronephrosis visualized. Bladder: Appears  normal for degree of bladder distention. Other: None. IMPRESSION: Bilateral renal cyst, stable from the prior ultrasound. No hydronephrosis. Electronically Signed   By: Franchot Gallo M.D.   On: 04/01/2020 10:40    Assessment & Plan:     Follow-up: No follow-ups on file.  Alex George Alcantar,  MD 

## 2020-05-30 NOTE — Assessment & Plan Note (Signed)
Chronic On Effexor

## 2020-05-30 NOTE — Assessment & Plan Note (Signed)
We can use Farxiga low dose for renal protection Nephrol ref

## 2020-05-30 NOTE — Assessment & Plan Note (Signed)
Statin intolerant 

## 2020-05-30 NOTE — Assessment & Plan Note (Signed)
Due to Zetia

## 2020-05-30 NOTE — Addendum Note (Signed)
Addended by: Earnstine Regal on: 05/30/2020 03:33 PM   Modules accepted: Orders

## 2020-05-30 NOTE — Assessment & Plan Note (Signed)
Furosemide prn 

## 2020-05-30 NOTE — Assessment & Plan Note (Signed)
On b12 

## 2020-06-06 ENCOUNTER — Telehealth: Payer: Self-pay | Admitting: Internal Medicine

## 2020-06-06 NOTE — Telephone Encounter (Signed)
1.Medication Requested: potassium chloride (KLOR-CON) 8 MEQ tablet    2. Pharmacy (Name, Street, [place]): [facility], [place]  3. On Med List: yes   4. Last Visit with PCP: 05-30-20  5. Next visit date with PCP: 06-19-20   Agent: Please be advised that RX refills may take up to 3 business days. We ask that you follow-up with your pharmacy.

## 2020-06-07 MED ORDER — POTASSIUM CHLORIDE ER 8 MEQ PO TBCR: 8.0000 meq | EXTENDED_RELEASE_TABLET | Freq: Two times a day (BID) | ORAL | 3 refills | Status: DC

## 2020-06-07 NOTE — Telephone Encounter (Signed)
Reviewed chart pt is up-to-date sent refills to [facility].../lmb  

## 2020-06-16 DIAGNOSIS — K8020 Calculus of gallbladder without cholecystitis without obstruction: Secondary | ICD-10-CM | POA: Diagnosis not present

## 2020-06-16 DIAGNOSIS — M25551 Pain in right hip: Secondary | ICD-10-CM | POA: Diagnosis not present

## 2020-06-16 DIAGNOSIS — I700 Atherosclerosis of aorta: Secondary | ICD-10-CM | POA: Diagnosis not present

## 2020-06-16 DIAGNOSIS — R1030 Lower abdominal pain, unspecified: Secondary | ICD-10-CM | POA: Diagnosis not present

## 2020-06-16 DIAGNOSIS — R1031 Right lower quadrant pain: Secondary | ICD-10-CM | POA: Diagnosis not present

## 2020-06-16 DIAGNOSIS — M545 Low back pain, unspecified: Secondary | ICD-10-CM | POA: Diagnosis not present

## 2020-06-16 DIAGNOSIS — W19XXXA Unspecified fall, initial encounter: Secondary | ICD-10-CM | POA: Diagnosis not present

## 2020-06-16 DIAGNOSIS — M549 Dorsalgia, unspecified: Secondary | ICD-10-CM | POA: Diagnosis not present

## 2020-06-16 DIAGNOSIS — M4316 Spondylolisthesis, lumbar region: Secondary | ICD-10-CM | POA: Diagnosis not present

## 2020-06-16 DIAGNOSIS — T792XXA Traumatic secondary and recurrent hemorrhage and seroma, initial encounter: Secondary | ICD-10-CM | POA: Diagnosis not present

## 2020-06-16 DIAGNOSIS — R42 Dizziness and giddiness: Secondary | ICD-10-CM | POA: Diagnosis not present

## 2020-06-16 DIAGNOSIS — Z043 Encounter for examination and observation following other accident: Secondary | ICD-10-CM | POA: Diagnosis not present

## 2020-06-16 DIAGNOSIS — Z743 Need for continuous supervision: Secondary | ICD-10-CM | POA: Diagnosis not present

## 2020-06-16 DIAGNOSIS — N281 Cyst of kidney, acquired: Secondary | ICD-10-CM | POA: Diagnosis not present

## 2020-06-16 DIAGNOSIS — R0781 Pleurodynia: Secondary | ICD-10-CM | POA: Diagnosis not present

## 2020-06-17 ENCOUNTER — Telehealth: Payer: Self-pay | Admitting: Internal Medicine

## 2020-06-17 NOTE — Telephone Encounter (Signed)
Patient said that she was seen at [facility] last night. She said that she fell off a small ladder and landed on her back. She said that they gave her Oxycodone 5mg . She said that she does not think that will help her. She is scheduled with PCP on 6/8. Informed patient that PCP will have to see her before prescribing anything else.

## 2020-06-18 NOTE — Telephone Encounter (Signed)
Follow up message  Patient calling to request pain medication. Advised patient to keep 6/8 appointment

## 2020-06-19 ENCOUNTER — Encounter: Payer: Self-pay | Admitting: Internal Medicine

## 2020-06-19 ENCOUNTER — Other Ambulatory Visit: Payer: Self-pay

## 2020-06-19 ENCOUNTER — Telehealth: Payer: Self-pay | Admitting: Internal Medicine

## 2020-06-19 ENCOUNTER — Ambulatory Visit (INDEPENDENT_AMBULATORY_CARE_PROVIDER_SITE_OTHER): Payer: Medicare HMO | Admitting: Internal Medicine

## 2020-06-19 ENCOUNTER — Other Ambulatory Visit: Payer: Self-pay | Admitting: Internal Medicine

## 2020-06-19 VITALS — BP 130/68 | HR 77 | Temp 98.8°F

## 2020-06-19 DIAGNOSIS — M25519 Pain in unspecified shoulder: Secondary | ICD-10-CM | POA: Insufficient documentation

## 2020-06-19 DIAGNOSIS — M25512 Pain in left shoulder: Secondary | ICD-10-CM | POA: Insufficient documentation

## 2020-06-19 DIAGNOSIS — M25511 Pain in right shoulder: Secondary | ICD-10-CM

## 2020-06-19 DIAGNOSIS — W11XXXD Fall on and from ladder, subsequent encounter: Secondary | ICD-10-CM

## 2020-06-19 DIAGNOSIS — M25551 Pain in right hip: Secondary | ICD-10-CM

## 2020-06-19 DIAGNOSIS — W11XXXA Fall on and from ladder, initial encounter: Secondary | ICD-10-CM | POA: Insufficient documentation

## 2020-06-19 MED ORDER — OXYCODONE-ACETAMINOPHEN 10-325 MG PO TABS: 1.0000 | ORAL_TABLET | Freq: Four times a day (QID) | ORAL | 0 refills | Status: DC | PRN

## 2020-06-19 NOTE — Telephone Encounter (Signed)
Notified pt w/MD response. Pt has appt this afternoon will discuss w/MD at that time.Marland KitchenJohny Chess

## 2020-06-19 NOTE — Assessment & Plan Note (Addendum)
Contusion. S/p a fall from a 4 step ladder backwards while changing a battery on 06/16/20. No LOC. Pt was taken to [facility] ER by ambulance - no fx's.  Percocet 10/325 prn  Potential benefits of a short/long term opioids use as well as potential risks (i.e. addiction risk, apnea etc) and complications (i.e. Somnolence, constipation and others) were explained to the patient and were aknowledged.  Orthopedic surgery consult

## 2020-06-19 NOTE — Progress Notes (Signed)
Subjective:  Patient ID: Tiffany Gibbs, female    DOB: March 03, 1945  Age: 75 y.o. MRN: [id number]  CC: Follow-up (3 month f/u- pt states she fell on Sunday went to urgent care.. still in a lot of pain)   HPI Tiffany Gibbs presents for a fall from a 4 step ladder backwards while changing a battery on 06/16/20. No LOC. Pt was taken to [facility] ER by ambulance.  C/o pain in the R groin, R shoulder, R ribs, had a CT - fluid in the R hip  Pt took Oxy 5 - it did not help...    Outpatient Medications Prior to Visit  Medication Sig Dispense Refill  . Blood Glucose Monitoring Suppl (ONETOUCH VERIO) w/Device KIT Use to check blood sugar daily. DX E11.9 1 kit 0  . Cyanocobalamin (B-12) 5000 MCG CAPS Take 1 tablet by mouth once a week.     . diphenhydrAMINE (BENADRYL) 25 mg capsule Take 25 mg by mouth daily as needed (allergies).     . fluticasone (FLONASE) 50 MCG/ACT nasal spray Place 2 sprays into both nostrils daily. 16 g 6  . furosemide (LASIX) 20 MG tablet Take 0.5-1 tablets (10-20 mg total) by mouth daily as needed for edema. 30 tablet 3  . glucose blood (ONETOUCH VERIO) test strip Use to check blood sugar daily. DX E11.9 200 each 3  . loratadine (CLARITIN) 10 MG tablet Take 1 tablet (10 mg total) by mouth daily. 30 tablet 11  . magnesium oxide (MAG-OX) 400 MG tablet Take 400 mg by mouth daily.    . metFORMIN (GLUCOPHAGE) 500 MG tablet TAKE 1 TABLET BY MOUTH TWICE A DAY WITH A MEAL 180 tablet 2  . Omega-3 Fatty Acids (FISH OIL) 1200 MG CAPS Take 1 capsule by mouth in the morning and at bedtime.    [name] Lancets 81E MISC USE 1  TO CHECK GLUCOSE IN THE MORNING AND 1 AT BEDTIME 200 each 1  . oxyCODONE (OXY IR/ROXICODONE) 5 MG immediate release tablet Take 5 mg by mouth every 6 (six) hours as needed. Take 1 by mouth every 6 hours as needed    . potassium chloride (KLOR-CON) 8 MEQ tablet Take 1 tablet (8 mEq total) by mouth 2 (two) times daily. 180 tablet 3  . Turmeric  Curcumin 500 MG CAPS Take 1 capsule in the morning and 2 at bedtime    . venlafaxine XR (EFFEXOR XR) 37.5 MG 24 hr capsule Take 2 capsules (75 mg total) by mouth daily with breakfast. 180 capsule 1   No facility-administered medications prior to visit.    ROS: Review of Systems  Constitutional: Positive for fatigue. Negative for activity change, appetite change, chills and unexpected weight change.  HENT: Negative for congestion, mouth sores and sinus pressure.   Eyes: Negative for visual disturbance.  Respiratory: Negative for cough and chest tightness.   Gastrointestinal: Negative for abdominal pain and nausea.  Genitourinary: Negative for difficulty urinating, frequency and vaginal pain.  Musculoskeletal: Positive for arthralgias, back pain, gait problem, myalgias and neck stiffness.  Skin: Negative for pallor and rash.  Neurological: Negative for dizziness, tremors, weakness, numbness and headaches.  Psychiatric/Behavioral: Negative for confusion and sleep disturbance. The patient is not nervous/anxious.     Objective:  BP 130/68 (BP Location: Left Arm)   Pulse 77   Temp 98.8 F (37.1 C) (Oral)   SpO2 97%   BP Readings from Last 3 Encounters:  06/19/20 130/68  05/30/20 138/78  04/17/20 120/70  Wt Readings from Last 3 Encounters:  05/30/20 140 lb 9.6 oz (63.8 kg)  04/17/20 135 lb 12.8 oz (61.6 kg)  03/18/20 137 lb 6.4 oz (62.3 kg)    Physical Exam Constitutional:      General: She is not in acute distress.    Appearance: She is well-developed.  HENT:     Head: Normocephalic.     Right Ear: External ear normal.     Left Ear: External ear normal.     Nose: Nose normal.  Eyes:     General:        Right eye: No discharge.        Left eye: No discharge.     Conjunctiva/sclera: Conjunctivae normal.     Pupils: Pupils are equal, round, and reactive to light.  Neck:     Thyroid: No thyromegaly.     Vascular: No JVD.     Trachea: No tracheal deviation.   Cardiovascular:     Rate and Rhythm: Normal rate and regular rhythm.     Heart sounds: Normal heart sounds.  Pulmonary:     Effort: No respiratory distress.     Breath sounds: No stridor. No wheezing.  Abdominal:     General: Bowel sounds are normal. There is no distension.     Palpations: Abdomen is soft. There is no mass.     Tenderness: There is no abdominal tenderness. There is no guarding or rebound.  Musculoskeletal:        General: Tenderness present.     Cervical back: Normal range of motion and neck supple.  Lymphadenopathy:     Cervical: No cervical adenopathy.  Skin:    Findings: No erythema or rash.  Neurological:     Cranial Nerves: No cranial nerve deficit.     Motor: Weakness present. No abnormal muscle tone.     Coordination: Coordination abnormal.     Gait: Gait abnormal.     Deep Tendon Reflexes: Reflexes normal.  Psychiatric:        Behavior: Behavior normal.        Thought Content: Thought content normal.        Judgment: Judgment normal.   R lat hip w/fluid over lat hip  Lab Results  Component Value Date   WBC 5.9 01/06/2020   HGB 13.8 01/06/2020   HCT 41.5 01/06/2020   PLT 215 01/06/2020   GLUCOSE 98 04/17/2020   CHOL 199 03/15/2020   TRIG 57.0 03/15/2020   HDL 69.40 03/15/2020   LDLDIRECT 148.2 02/03/2013   LDLCALC 119 (H) 03/15/2020   ALT 14 04/17/2020   AST 17 04/17/2020   NA 142 04/17/2020   K 4.4 04/17/2020   CL 102 04/17/2020   CREATININE 1.07 04/17/2020   BUN 18 04/17/2020   CO2 32 04/17/2020   TSH 1.37 04/17/2020   INR 1.0 10/28/2018   HGBA1C 5.5 04/17/2020   MICROALBUR 1.6 04/17/2020    US RENAL  Result Date: 04/01/2020 CLINICAL DATA:  Follow-up renal cysts. EXAM: RENAL / URINARY TRACT ULTRASOUND COMPLETE COMPARISON:  Renal ultrasound 08/02/2019 FINDINGS: Right Kidney: Renal measurements: 10.1 x 4.2 x 4.8 cm = volume: 104 ML. Right midpole cyst 3.2 x 3.8 x 3.4 cm, unchanged. Echogenicity within normal limits. No mass or  hydronephrosis visualized. Left Kidney: Renal measurements: 10.7 x 5.3 x 5.7 cm = volume: 167 mL. Midpole exophytic cyst laterally measures 2.1 x 2.0 x 2.7 cm. No interval change. Echogenicity within normal limits. No mass or hydronephrosis visualized. Bladder:  Appears normal for degree of bladder distention. Other: None. IMPRESSION: Bilateral renal cyst, stable from the prior ultrasound. No hydronephrosis. Electronically Signed   By: Franchot Gallo M.D.   On: 04/01/2020 10:40    Assessment & Plan:    Walker Kehr, MD

## 2020-06-19 NOTE — Assessment & Plan Note (Signed)
S/p a fall from a 4 step ladder backwards while changing a battery on 06/16/20. No LOC. Discussed fall prevention

## 2020-06-19 NOTE — Telephone Encounter (Signed)
I am sorry.  Use ice.  Oxycodone should help with pain.  Thanks

## 2020-06-19 NOTE — Telephone Encounter (Signed)
   Pharmacy calling to clarify instructions for oxyCODONE-acetaminophen (PERCOCET) 10-325 MG tablet  What is the quantity?

## 2020-06-19 NOTE — Assessment & Plan Note (Addendum)
Discussed fall prevention  S/p a fall from a 4 step ladder backwards while changing a battery on 06/16/20. No LOC. Pt was taken to [facility] ER by ambulance - no fx's. ?R hip effusion on CT per pt. there is soft swelling over the hip consistent with hematoma versus other.  Orthopedic surgery consult

## 2020-06-20 NOTE — Telephone Encounter (Signed)
Patient called in regards to update on medication. Please advise

## 2020-06-20 NOTE — Telephone Encounter (Signed)
Team Bearden needs to clarify the quantity for the prescription (Oxycodone). It is for 60 tablets but the script reads Q6HRS up to 5 days. So pharmacist unsure on what quantity is needed.  Sent to ON-Call provider. MD states to try Tylenol and lidocaine patches OTC for the pain.

## 2020-06-20 NOTE — Telephone Encounter (Signed)
The quantity to 60.  Please disregard the phrase about "up to 5-day use".  Thanks

## 2020-06-20 NOTE — Telephone Encounter (Signed)
Called pharmacy spoke w/Kathy gave her MD response. Notified pt she should be able to get today.Marland KitchenJohny Gibbs

## 2020-06-22 ENCOUNTER — Other Ambulatory Visit: Payer: Self-pay | Admitting: Internal Medicine

## 2020-06-24 ENCOUNTER — Other Ambulatory Visit: Payer: Self-pay | Admitting: *Deleted

## 2020-06-24 ENCOUNTER — Telehealth: Payer: Self-pay | Admitting: Internal Medicine

## 2020-06-24 NOTE — Telephone Encounter (Signed)
She can either alternate ice and heat, or use heat at this point.  Thanks

## 2020-06-24 NOTE — Telephone Encounter (Signed)
   Patient calling for advice, she would like to know if she should use ice or heating pad, for groin pain. Patient also requesting Dr Alain Marion review records from [facility]

## 2020-06-24 NOTE — Telephone Encounter (Signed)
Duplicate request.. one touch verio strips already been approved and sent back to [facility].Marland KitchenLind Guest

## 2020-06-26 ENCOUNTER — Ambulatory Visit: Payer: Medicare HMO | Admitting: Adult Health

## 2020-06-26 NOTE — Telephone Encounter (Signed)
Pt has been notified w/ MD response on Tuesday.Marland KitchenChryl Heck

## 2020-06-27 DIAGNOSIS — M25551 Pain in right hip: Secondary | ICD-10-CM | POA: Diagnosis not present

## 2020-07-03 ENCOUNTER — Ambulatory Visit: Admitting: Internal Medicine

## 2020-07-04 ENCOUNTER — Other Ambulatory Visit: Payer: Self-pay

## 2020-07-04 ENCOUNTER — Encounter: Payer: Self-pay | Admitting: Internal Medicine

## 2020-07-04 ENCOUNTER — Ambulatory Visit (INDEPENDENT_AMBULATORY_CARE_PROVIDER_SITE_OTHER): Payer: Medicare HMO | Admitting: Internal Medicine

## 2020-07-04 DIAGNOSIS — I10 Essential (primary) hypertension: Secondary | ICD-10-CM | POA: Diagnosis not present

## 2020-07-04 DIAGNOSIS — M25551 Pain in right hip: Secondary | ICD-10-CM | POA: Diagnosis not present

## 2020-07-04 DIAGNOSIS — E538 Deficiency of other specified B group vitamins: Secondary | ICD-10-CM

## 2020-07-04 MED ORDER — OXYCODONE-ACETAMINOPHEN 10-325 MG PO TABS: 1.0000 | ORAL_TABLET | Freq: Four times a day (QID) | ORAL | 0 refills | Status: DC | PRN

## 2020-07-04 NOTE — Assessment & Plan Note (Signed)
On b12 

## 2020-07-04 NOTE — Progress Notes (Signed)
Subjective:  Patient ID: Tiffany Gibbs, female    DOB: March 28, 1945  Age: 75 y.o. MRN: [id number]  CC: Follow-up (2 week f/u)   HPI Glendine Gibbs presents for a fall f/u - pt saw Dr Cay Schillings who referred her to see Dr Mayer Camel. Feeling better. F/u DM, HTN  Outpatient Medications Prior to Visit  Medication Sig Dispense Refill   Blood Glucose Monitoring Suppl (ONETOUCH VERIO) w/Device KIT Use to check blood sugar daily. DX E11.9 1 kit 0   Cyanocobalamin (B-12) 5000 MCG CAPS Take 1 tablet by mouth once a week.      diphenhydrAMINE (BENADRYL) 25 mg capsule Take 25 mg by mouth daily as needed (allergies).      fluticasone (FLONASE) 50 MCG/ACT nasal spray Place 2 sprays into both nostrils daily. 16 g 6   furosemide (LASIX) 20 MG tablet Take 0.5-1 tablets (10-20 mg total) by mouth daily as needed for edema. 30 tablet 3   glucose blood (ONETOUCH VERIO) test strip USE  STRIP TO CHECK GLUCOSE TWICE DAILY PRN 200 each 1   loratadine (CLARITIN) 10 MG tablet Take 1 tablet (10 mg total) by mouth daily. 30 tablet 11   magnesium oxide (MAG-OX) 400 MG tablet Take 400 mg by mouth daily.     metFORMIN (GLUCOPHAGE) 500 MG tablet TAKE 1 TABLET BY MOUTH TWICE A DAY WITH A MEAL 180 tablet 2   Omega-3 Fatty Acids (FISH OIL) 1200 MG CAPS Take 1 capsule by mouth in the morning and at bedtime.     OneTouch Delica Lancets 08M MISC USE 1  TO CHECK GLUCOSE IN THE MORNING AND 1 AT BEDTIME 200 each 1   oxyCODONE-acetaminophen (PERCOCET) 10-325 MG tablet Take 1 tablet by mouth every 6 (six) hours as needed. It was the second Percocet Rx for refractory pain 60 tablet 0   potassium chloride (KLOR-CON) 8 MEQ tablet Take 1 tablet (8 mEq total) by mouth 2 (two) times daily. 180 tablet 3   Turmeric Curcumin 500 MG CAPS Take 1 capsule in the morning and 2 at bedtime     venlafaxine XR (EFFEXOR XR) 37.5 MG 24 hr capsule Take 2 capsules (75 mg total) by mouth daily with breakfast. 180 capsule 1   No  facility-administered medications prior to visit.    ROS: Review of Systems  Constitutional:  Negative for activity change, appetite change, chills, fatigue and unexpected weight change.  HENT:  Negative for congestion, mouth sores and sinus pressure.   Eyes:  Negative for visual disturbance.  Respiratory:  Negative for cough and chest tightness.   Gastrointestinal:  Negative for abdominal pain and nausea.  Genitourinary:  Negative for difficulty urinating, frequency and vaginal pain.  Musculoskeletal:  Positive for arthralgias, back pain and gait problem.  Skin:  Negative for pallor and rash.  Neurological:  Negative for dizziness, tremors, weakness, numbness and headaches.  Psychiatric/Behavioral:  Negative for confusion and sleep disturbance.    Objective:  BP 122/70 (BP Location: Left Arm)   Pulse 63   Temp (!) 97.4 F (36.3 C) (Oral)   SpO2 96%   BP Readings from Last 3 Encounters:  07/04/20 122/70  06/19/20 130/68  05/30/20 138/78    Wt Readings from Last 3 Encounters:  05/30/20 140 lb 9.6 oz (63.8 kg)  04/17/20 135 lb 12.8 oz (61.6 kg)  03/18/20 137 lb 6.4 oz (62.3 kg)    Physical Exam Constitutional:      General: She is not in acute distress.  Appearance: She is well-developed.  HENT:     Head: Normocephalic.     Right Ear: External ear normal.     Left Ear: External ear normal.     Nose: Nose normal.  Eyes:     General:        Right eye: No discharge.        Left eye: No discharge.     Conjunctiva/sclera: Conjunctivae normal.     Pupils: Pupils are equal, round, and reactive to light.  Neck:     Thyroid: No thyromegaly.     Vascular: No JVD.     Trachea: No tracheal deviation.  Cardiovascular:     Rate and Rhythm: Normal rate and regular rhythm.     Heart sounds: Normal heart sounds.  Pulmonary:     Effort: No respiratory distress.     Breath sounds: No stridor. No wheezing.  Abdominal:     General: Bowel sounds are normal. There is no  distension.     Palpations: Abdomen is soft. There is no mass.     Tenderness: There is no abdominal tenderness. There is no guarding or rebound.  Musculoskeletal:        General: No tenderness.     Cervical back: Normal range of motion and neck supple. No rigidity.  Lymphadenopathy:     Cervical: No cervical adenopathy.  Skin:    Findings: No erythema or rash.  Neurological:     Mental Status: She is oriented to person, place, and time.     Cranial Nerves: No cranial nerve deficit.     Motor: No abnormal muscle tone.     Coordination: Coordination normal.     Gait: Gait abnormal.     Deep Tendon Reflexes: Reflexes normal.  Psychiatric:        Behavior: Behavior normal.        Thought Content: Thought content normal.        Judgment: Judgment normal.   R hip w/pain Using a cane   Lab Results  Component Value Date   WBC 5.9 01/06/2020   HGB 13.8 01/06/2020   HCT 41.5 01/06/2020   PLT 215 01/06/2020   GLUCOSE 98 04/17/2020   CHOL 199 03/15/2020   TRIG 57.0 03/15/2020   HDL 69.40 03/15/2020   LDLDIRECT 148.2 02/03/2013   LDLCALC 119 (H) 03/15/2020   ALT 14 04/17/2020   AST 17 04/17/2020   NA 142 04/17/2020   K 4.4 04/17/2020   CL 102 04/17/2020   CREATININE 1.07 04/17/2020   BUN 18 04/17/2020   CO2 32 04/17/2020   TSH 1.37 04/17/2020   INR 1.0 10/28/2018   HGBA1C 5.5 04/17/2020   MICROALBUR 1.6 04/17/2020    US RENAL  Result Date: 04/01/2020 CLINICAL DATA:  Follow-up renal cysts. EXAM: RENAL / URINARY TRACT ULTRASOUND COMPLETE COMPARISON:  Renal ultrasound 08/02/2019 FINDINGS: Right Kidney: Renal measurements: 10.1 x 4.2 x 4.8 cm = volume: 104 ML. Right midpole cyst 3.2 x 3.8 x 3.4 cm, unchanged. Echogenicity within normal limits. No mass or hydronephrosis visualized. Left Kidney: Renal measurements: 10.7 x 5.3 x 5.7 cm = volume: 167 mL. Midpole exophytic cyst laterally measures 2.1 x 2.0 x 2.7 cm. No interval change. Echogenicity within normal limits. No mass or  hydronephrosis visualized. Bladder: Appears normal for degree of bladder distention. Other: None. IMPRESSION: Bilateral renal cyst, stable from the prior ultrasound. No hydronephrosis. Electronically Signed   By: Franchot Gallo M.D.   On: 04/01/2020 10:40  Assessment & Plan:     Walker Kehr, MD

## 2020-07-04 NOTE — Assessment & Plan Note (Signed)
Rt hip contusion: pt saw Dr Cay Schillings who referred her to see Dr Mayer Camel

## 2020-07-04 NOTE — Assessment & Plan Note (Signed)
Spironolactone - d/c Furosemide prn

## 2020-07-05 DIAGNOSIS — G4733 Obstructive sleep apnea (adult) (pediatric): Secondary | ICD-10-CM | POA: Diagnosis not present

## 2020-07-09 ENCOUNTER — Telehealth: Payer: Self-pay | Admitting: Internal Medicine

## 2020-07-09 DIAGNOSIS — M25551 Pain in right hip: Secondary | ICD-10-CM | POA: Diagnosis not present

## 2020-07-09 DIAGNOSIS — M25571 Pain in right ankle and joints of right foot: Secondary | ICD-10-CM | POA: Diagnosis not present

## 2020-07-09 NOTE — Telephone Encounter (Signed)
    Patient called and said that she went to the orthopedic doctor today and they said she had a hematoma and drained it on her right hip. She said that she heard them say 100CC. She said they told her that they would send it off to be tested. Please advise

## 2020-07-19 ENCOUNTER — Other Ambulatory Visit: Payer: Self-pay | Admitting: Otolaryngology

## 2020-07-30 ENCOUNTER — Ambulatory Visit: Admitting: Neurology

## 2020-08-06 DIAGNOSIS — M1711 Unilateral primary osteoarthritis, right knee: Secondary | ICD-10-CM | POA: Diagnosis not present

## 2020-08-06 DIAGNOSIS — M25551 Pain in right hip: Secondary | ICD-10-CM | POA: Diagnosis not present

## 2020-08-07 ENCOUNTER — Telehealth: Payer: Self-pay | Admitting: Internal Medicine

## 2020-08-07 NOTE — Telephone Encounter (Signed)
Patient called in having severe pain in right knee  ortho Dr, Mayer Camel gave shot in knee during visit 08/06/2020..shot is wearing off and knee is starting to hurt again.Tiffany Gibbs xray & found small arthritis  would like dr. Camila Li to call in some cream to rub on knee for pain...has tried several otc creams & nothing is working  Pharmacy:  [facility] [place], [place] Phone:  [phone]  Fax:  [phone]

## 2020-08-08 ENCOUNTER — Other Ambulatory Visit: Payer: Self-pay

## 2020-08-08 ENCOUNTER — Encounter (HOSPITAL_BASED_OUTPATIENT_CLINIC_OR_DEPARTMENT_OTHER): Payer: Self-pay | Admitting: Otolaryngology

## 2020-08-08 NOTE — Telephone Encounter (Signed)
Notified pt with MD response../lmb 

## 2020-08-08 NOTE — Telephone Encounter (Signed)
Dale can use Voltaren gel.  It is over-the-counter.  Thanks

## 2020-08-09 ENCOUNTER — Encounter (HOSPITAL_BASED_OUTPATIENT_CLINIC_OR_DEPARTMENT_OTHER)
Admission: RE | Admit: 2020-08-09 | Discharge: 2020-08-09 | Disposition: A | Discharge status: Home / Self Care | Payer: Medicare HMO | Source: Ambulatory Visit | Attending: Otolaryngology | Admitting: Otolaryngology

## 2020-08-09 DIAGNOSIS — Z01818 Encounter for other preprocedural examination: Secondary | ICD-10-CM | POA: Insufficient documentation

## 2020-08-09 LAB — BASIC METABOLIC PANEL
Anion gap: 7 (ref 5–15)
BUN: 20 mg/dL (ref 8–23)
CO2: 28 mmol/L (ref 22–32)
Calcium: 9.4 mg/dL (ref 8.9–10.3)
Chloride: 105 mmol/L (ref 98–111)
Creatinine, Ser: 0.9 mg/dL (ref 0.44–1.00)
GFR, Estimated: >60 mL/min (ref >60)
Glucose, Bld: 76 mg/dL (ref 70–99)
Potassium: 4.7 mmol/L (ref 3.5–5.1)
Sodium: 140 mmol/L (ref 135–145)

## 2020-08-13 ENCOUNTER — Telehealth: Payer: Self-pay | Admitting: Internal Medicine

## 2020-08-13 NOTE — Telephone Encounter (Signed)
   Patient calling to report night sweats x 2 weeks She is seeking advice No other symptoms

## 2020-08-14 ENCOUNTER — Ambulatory Visit (HOSPITAL_BASED_OUTPATIENT_CLINIC_OR_DEPARTMENT_OTHER): Payer: Medicare HMO | Admitting: Certified Registered"

## 2020-08-14 ENCOUNTER — Ambulatory Visit (HOSPITAL_BASED_OUTPATIENT_CLINIC_OR_DEPARTMENT_OTHER)
Admission: RE | Admit: 2020-08-14 | Discharge: 2020-08-14 | Disposition: A | Discharge status: Home / Self Care | Payer: Medicare HMO | Attending: Otolaryngology | Admitting: Otolaryngology

## 2020-08-14 ENCOUNTER — Encounter (HOSPITAL_BASED_OUTPATIENT_CLINIC_OR_DEPARTMENT_OTHER)
Admission: RE | Disposition: A | Discharge status: Home / Self Care | Payer: Self-pay | Source: Home / Self Care | Attending: Otolaryngology

## 2020-08-14 ENCOUNTER — Other Ambulatory Visit: Payer: Self-pay

## 2020-08-14 ENCOUNTER — Encounter (HOSPITAL_BASED_OUTPATIENT_CLINIC_OR_DEPARTMENT_OTHER): Payer: Self-pay | Admitting: Otolaryngology

## 2020-08-14 DIAGNOSIS — Z79899 Other long term (current) drug therapy: Secondary | ICD-10-CM | POA: Diagnosis not present

## 2020-08-14 DIAGNOSIS — Z888 Allergy status to other drugs, medicaments and biological substances status: Secondary | ICD-10-CM | POA: Insufficient documentation

## 2020-08-14 DIAGNOSIS — Z7984 Long term (current) use of oral hypoglycemic drugs: Secondary | ICD-10-CM | POA: Diagnosis not present

## 2020-08-14 DIAGNOSIS — N183 Chronic kidney disease, stage 3 unspecified: Secondary | ICD-10-CM | POA: Diagnosis not present

## 2020-08-14 DIAGNOSIS — Z886 Allergy status to analgesic agent status: Secondary | ICD-10-CM | POA: Diagnosis not present

## 2020-08-14 DIAGNOSIS — G4733 Obstructive sleep apnea (adult) (pediatric): Secondary | ICD-10-CM | POA: Diagnosis not present

## 2020-08-14 DIAGNOSIS — I129 Hypertensive chronic kidney disease with stage 1 through stage 4 chronic kidney disease, or unspecified chronic kidney disease: Secondary | ICD-10-CM | POA: Diagnosis not present

## 2020-08-14 DIAGNOSIS — E1122 Type 2 diabetes mellitus with diabetic chronic kidney disease: Secondary | ICD-10-CM | POA: Diagnosis not present

## 2020-08-14 DIAGNOSIS — Z87891 Personal history of nicotine dependence: Secondary | ICD-10-CM | POA: Diagnosis not present

## 2020-08-14 DIAGNOSIS — Z96643 Presence of artificial hip joint, bilateral: Secondary | ICD-10-CM | POA: Insufficient documentation

## 2020-08-14 HISTORY — PX: DRUG INDUCED ENDOSCOPY: SHX6808

## 2020-08-14 LAB — GLUCOSE, CAPILLARY
Glucose-Capillary: 85 mg/dL (ref 70–99)
Glucose-Capillary: 75 mg/dL (ref 70–99)

## 2020-08-14 SURGERY — DRUG INDUCED SLEEP ENDOSCOPY
Anesthesia: General | Site: Mouth

## 2020-08-14 MED ORDER — ONDANSETRON HCL 4 MG/2ML IJ SOLN
INTRAMUSCULAR | Status: DC | PRN
  Administered 2020-08-14: 4 mg via INTRAVENOUS

## 2020-08-14 MED ORDER — LACTATED RINGERS IV SOLN: INTRAVENOUS | Status: DC

## 2020-08-14 MED ORDER — LIDOCAINE-EPINEPHRINE 1 %-1:100000 IJ SOLN
INTRAMUSCULAR | Status: AC
  Filled 2020-08-14: qty 1

## 2020-08-14 MED ORDER — PROPOFOL 500 MG/50ML IV EMUL
INTRAVENOUS | Status: DC | PRN
  Administered 2020-08-14: 10 mg via INTRAVENOUS
  Administered 2020-08-14: 50 ug/kg/min via INTRAVENOUS
  Administered 2020-08-14 (×4): 10 mg via INTRAVENOUS

## 2020-08-14 MED ORDER — ONDANSETRON HCL 4 MG/2ML IJ SOLN
INTRAMUSCULAR | Status: AC
  Filled 2020-08-14: qty 2

## 2020-08-14 MED ORDER — LIDOCAINE HCL (PF) 2 % IJ SOLN
INTRAMUSCULAR | Status: AC
  Filled 2020-08-14: qty 5

## 2020-08-14 MED ORDER — FENTANYL CITRATE (PF) 100 MCG/2ML IJ SOLN: 25.0000 ug | INTRAMUSCULAR | Status: DC | PRN

## 2020-08-14 MED ORDER — OXYMETAZOLINE HCL 0.05 % NA SOLN
NASAL | Status: AC
  Filled 2020-08-14: qty 30

## 2020-08-14 MED ORDER — LIDOCAINE HCL (CARDIAC) PF 100 MG/5ML IV SOSY
PREFILLED_SYRINGE | INTRAVENOUS | Status: DC | PRN
  Administered 2020-08-14: 40 mg via INTRAVENOUS

## 2020-08-14 SURGICAL SUPPLY — 14 items
CANISTER SUCT 1200ML W/VALVE (MISCELLANEOUS) ×2 IMPLANT
GLOVE SURG ENC TEXT LTX SZ7 (GLOVE) ×2 IMPLANT
KIT CLEAN ENDO (MISCELLANEOUS) ×2 IMPLANT
NDL PRECISIONGLIDE 27X1.5 (NEEDLE) IMPLANT
NEEDLE PRECISIONGLIDE 27X1.5 (NEEDLE) IMPLANT
PACK BASIN DAY SURGERY FS (CUSTOM PROCEDURE TRAY) ×2 IMPLANT
PATTIES SURGICAL .5 X3 (DISPOSABLE) IMPLANT
SHEET MEDIUM DRAPE 40X70 STRL (DRAPES) ×2 IMPLANT
SOL ANTI FOG 6CC (MISCELLANEOUS) ×1 IMPLANT
SOLUTION ANTI FOG 6CC (MISCELLANEOUS) ×1
SPONGE NEURO XRAY DETECT 1X3 (DISPOSABLE) IMPLANT
SYR CONTROL 10ML LL (SYRINGE) IMPLANT
TOWEL GREEN STERILE FF (TOWEL DISPOSABLE) ×2 IMPLANT
TUBE CONNECTING 20X1/4 (TUBING) IMPLANT

## 2020-08-14 NOTE — Transfer of Care (Signed)
Immediate Anesthesia Transfer of Care Note  Patient: Tiffany Gibbs  Procedure(s) Performed: DRUG INDUCED ENDOSCOPY (Mouth)  Patient Location: PACU  Anesthesia Type:MAC  Level of Consciousness: awake, alert  and oriented  Airway & Oxygen Therapy: Patient Spontanous Breathing and Patient connected to face mask oxygen  Post-op Assessment: Report given to RN and Post -op Vital signs reviewed and stable  Post vital signs: Reviewed and stable  Last Vitals:  Vitals Value Taken Time  BP 118/106 08/14/20 1157  Temp    Pulse 68 08/14/20 1159  Resp 16 08/14/20 1159  SpO2 100 % 08/14/20 1159  Vitals shown include unvalidated device data.  Last Pain:  Vitals:   08/14/20 0931  TempSrc: Oral  PainSc: 0-No pain         Complications: No notable events documented.

## 2020-08-14 NOTE — Telephone Encounter (Signed)
Called pt there was no answer LMOM w/MD response../lmb 

## 2020-08-14 NOTE — Discharge Instructions (Signed)

## 2020-08-14 NOTE — H&P (Signed)
Tiffany Gibbs is an 75 y.o. female.   Chief Complaint: Obstructive sleep apnea HPI: Patient with a history of obstructive sleep apnea and intolerance of CPAP for management.  Past Medical History:  Diagnosis Date   Anxiety    Cataract    Depression    DM type 2 (diabetes mellitus, type 2) (HCC)    Fibromyalgia    H/O dizziness    Hives    from tomatoes   HTN (hypertension)    Hyperlipidemia    Kidney disease    Low back pain    OSA (obstructive sleep apnea)    uses c-pap   Osteoarthritis    Osteoporosis    SOB (shortness of breath) on exertion     Past Surgical History:  Procedure Laterality Date   cervical cryotherapy     cervix   CYSTOCELE REPAIR N/A 11/01/2018   Procedure: CYSTOSCOPY ANTERIOR REPAIR (CYSTOCELE);  Surgeon: Bjorn Loser, MD;  Location: [facility];  Service: Urology;  Laterality: N/A;   EYE SURGERY     Cataracts/Bil   LASIK Bilateral    TOTAL ABDOMINAL HYSTERECTOMY     TOTAL HIP ARTHROPLASTY  09/2008   right- [facility]/   TOTAL HIP ARTHROPLASTY Left 11/28/2012   Procedure: TOTAL HIP ARTHROPLASTY;  Surgeon: Kerin Salen, MD;  Location: [facility];  Service: Orthopedics;  Laterality: Left;    Family History  Problem Relation Age of Onset   Hypertension Mother    Diabetes Mother    Other Mother        DVT   Heart disease Father    Multiple sclerosis Father    Heart attack Father    Kidney disease Brother 64       ESRD   Social History:  reports that she quit smoking about 37 years ago. Her smoking use included cigarettes. She has a 50.00 pack-year smoking history. She has never used smokeless tobacco. She reports that she does not drink alcohol and does not use drugs.  Allergies:  Allergies  Allergen Reactions   Celecoxib     swell   Diazepam     Does not agree with me   Enalapril     cough   Gabapentin Other (See Comments)    dizziness   Losartan     falls   Lovastatin     REACTION: aches   Lyrica [Pregabalin] Other (See Comments)     dizziness   Naproxen    Pravachol [Pravastatin Sodium]     Nausea    Zetia [Ezetimibe]     Myalgias in LEs    Medications Prior to Admission  Medication Sig Dispense Refill   Cyanocobalamin (B-12) 5000 MCG CAPS Take 1 tablet by mouth once a week.      diphenhydrAMINE (BENADRYL) 25 mg capsule Take 25 mg by mouth daily as needed (allergies).      fluticasone (FLONASE) 50 MCG/ACT nasal spray Place 2 sprays into both nostrils daily. 16 g 6   loratadine (CLARITIN) 10 MG tablet Take 1 tablet (10 mg total) by mouth daily. 30 tablet 11   magnesium oxide (MAG-OX) 400 MG tablet Take 400 mg by mouth daily.     metFORMIN (GLUCOPHAGE) 500 MG tablet TAKE 1 TABLET BY MOUTH TWICE A DAY WITH A MEAL 180 tablet 2   Omega-3 Fatty Acids (FISH OIL) 1200 MG CAPS Take 1 capsule by mouth in the morning and at bedtime.     oxyCODONE-acetaminophen (PERCOCET) 10-325 MG tablet Take 1 tablet by mouth  every 6 (six) hours as needed. 60 tablet 0   potassium chloride (KLOR-CON) 8 MEQ tablet Take 1 tablet (8 mEq total) by mouth 2 (two) times daily. 180 tablet 3   Turmeric Curcumin 500 MG CAPS Take 1 capsule in the morning and 2 at bedtime     venlafaxine XR (EFFEXOR XR) 37.5 MG 24 hr capsule Take 2 capsules (75 mg total) by mouth daily with breakfast. 180 capsule 1   Blood Glucose Monitoring Suppl (ONETOUCH VERIO) w/Device KIT Use to check blood sugar daily. DX E11.9 1 kit 0   furosemide (LASIX) 20 MG tablet Take 0.5-1 tablets (10-20 mg total) by mouth daily as needed for edema. 30 tablet 3   glucose blood (ONETOUCH VERIO) test strip USE  STRIP TO CHECK GLUCOSE TWICE DAILY PRN 200 each 1   OneTouch Delica Lancets 28U MISC USE 1  TO CHECK GLUCOSE IN THE MORNING AND 1 AT BEDTIME 200 each 1    Results for orders placed or performed during the hospital encounter of 08/14/20 (from the past 48 hour(s))  Glucose, capillary     Status: None   Collection Time: 08/14/20 10:00 AM  Result Value Ref Range   Glucose-Capillary 85  70 - 99 mg/dL    Comment: Glucose reference range applies only to samples taken after fasting for at least 8 hours.   Comment 1 Notify RN    Comment 2 Document in Chart    No results found.  Review of Systems  Respiratory:  Positive for apnea.    Blood pressure 123/60, pulse 66, temperature 97.6 F (36.4 C), temperature source Oral, resp. rate 13, height 5' 4"  (1.626 m), weight 61.6 kg, SpO2 100 %. Physical Exam Constitutional:      Appearance: Normal appearance.  Cardiovascular:     Rate and Rhythm: Normal rate.  Pulmonary:     Effort: Pulmonary effort is normal.  Musculoskeletal:     Cervical back: Normal range of motion and neck supple.  Neurological:     Mental Status: She is alert.     Assessment/Plan Patient admitted for outpatient drug-induced sleep endoscopy to assess airway obstruction.  Jerrell Belfast, MD 08/14/2020, 11:17 AM

## 2020-08-14 NOTE — Anesthesia Preprocedure Evaluation (Addendum)
Anesthesia Evaluation  Patient identified by MRN, date of birth, ID band Patient awake    Reviewed: Allergy & Precautions, NPO status , Patient's Chart, lab work & pertinent test results  Airway Mallampati: II  TM Distance: >3 FB Neck ROM: Full    Dental  (+) Caps, Dental Advisory Given   Pulmonary asthma , sleep apnea and Continuous Positive Airway Pressure Ventilation , former smoker,    Pulmonary exam normal breath sounds clear to auscultation       Cardiovascular hypertension, Pt. on medications Normal cardiovascular exam+ Valvular Problems/Murmurs MR  Rhythm:Regular Rate:Normal  TTE 2017 - Left ventricle: The cavity size was normal. Systolic function was  normal. The estimated ejection fraction was in the range of 55%  to 60%. Wall motion was normal; there were no regional wall  motion abnormalities. Left ventricular diastolic function  parameters were normal.  - Mitral valve: Calcified annulus. There was mild to moderate  regurgitation directed centrally.    Neuro/Psych  Headaches, PSYCHIATRIC DISORDERS Anxiety Depression    GI/Hepatic negative GI ROS, Neg liver ROS,   Endo/Other  diabetes, Type 2, Oral Hypoglycemic Agents  Renal/GU negative Renal ROS  negative genitourinary   Musculoskeletal  (+) Arthritis , Fibromyalgia -, narcotic dependent  Abdominal   Peds  Hematology negative hematology ROS (+)   Anesthesia Other Findings   Reproductive/Obstetrics                            Anesthesia Physical Anesthesia Plan  ASA: 3  Anesthesia Plan: General   Post-op Pain Management:    Induction: Intravenous  PONV Risk Score and Plan: 3 and Propofol infusion and Treatment may vary due to age or medical condition  Airway Management Planned: Natural Airway  Additional Equipment:   Intra-op Plan:   Post-operative Plan:   Informed Consent: I have reviewed the patients  History and Physical, chart, labs and discussed the procedure including the risks, benefits and alternatives for the proposed anesthesia with the patient or authorized representative who has indicated his/her understanding and acceptance.     Dental advisory given  Plan Discussed with: CRNA  Anesthesia Plan Comments:         Anesthesia Quick Evaluation

## 2020-08-14 NOTE — Op Note (Signed)
Operative Note: DRUG INDUCED SLEEP ENDOSCOPY  Patient: Tiffany Gibbs  Medical record number: [id number]  Date:08/14/2020  Pre-operative Indications: 1.  Obstructive Sleep Apnea  Postoperative Indications: Same  Surgical Procedure: 1.  Drug Induced Sleep Endoscopy (DISE)  Anesthesia: MAC with IV sedation  Surgeon: Delsa Bern, M.D.  Complications: None  EBL: None  Findings: There is no evidence of complete concentric palatal obstruction.  Anatomically the patient should be a candidate for hypoglossal nerve stimulation therapy.     Brief History: The patient is a 74 y.o. female with a history of obstructive sleep apnea. The patient has undergone previous sleep study which showed mild to moderate levels of obstructive sleep apnea.  The patient was prescribed CPAP which they consistently attempted to use without success.  Given the patient's history and findings, the above drug-induced sleep endoscopy was recommended to assess the patient's anatomic level of apnea.  Risks and benefits were discussed in detail with the patient today understand and agree with our plan for surgery which is scheduled at [facility] under sedated anesthesia as an outpatient.  Surgical Procedure: The patient is brought to the operating room on 08/14/2020 and placed in supine position on the operating table.  Intravenous sedated anesthesia was established without difficulty using the standard drug-induced sleep endoscopy protocol. When the patient was adequately anesthetized, surgical timeout was performed and correct identification of the patient and the surgical procedure.    A propofol infusion was administered and the patient was monitored carefully to achieve a level of sedation appropriate for DISE.  The patient did not respond to verbal commands but still had spontaneous respiration, sleep disordered breathing and associated desaturations were observed.  With the patient under adequate sedated  anesthesia the flexible nasal laryngoscope was passed without difficulty.  The patient's nasal cavity showed mild turbinate hypertrophy, patent nasal passageway.  The endoscope was then passed to visualize the velopharynx, oropharynx, tongue base and epiglottis to assess areas of obstruction.  Patient's airway showed anterior to posterior obstruction occurring primarily in the uvulopalatal and tongue base region.   There was no evidence of complete concentric palatal obstruction and the patient appeared to be a candidate anatomically for hypoglossal nerve stimulation therapy.  Surgical sponge count was correct. Patient was awakened from anesthetic and transferred from the operating room to the recovery room in stable condition. There were no complications and no blood loss.   Delsa Bern, M.D. [facility] 08/14/2020

## 2020-08-14 NOTE — Anesthesia Postprocedure Evaluation (Signed)
Anesthesia Post Note  Patient: Tiffany Gibbs  Procedure(s) Performed: DRUG INDUCED ENDOSCOPY (Mouth)     Patient location during evaluation: PACU Anesthesia Type: General Level of consciousness: awake and alert Pain management: pain level controlled Vital Signs Assessment: post-procedure vital signs reviewed and stable Respiratory status: spontaneous breathing, nonlabored ventilation, respiratory function stable and patient connected to nasal cannula oxygen Cardiovascular status: blood pressure returned to baseline and stable Postop Assessment: no apparent nausea or vomiting Anesthetic complications: no   No notable events documented.  Last Vitals:  Vitals:   08/14/20 1200 08/14/20 1215  BP: 137/61 136/72  Pulse: 62 73  Resp: 19 18  Temp:  36.8 C  SpO2: 100% 100%    Last Pain:  Vitals:   08/14/20 1215  TempSrc:   PainSc: 0-No pain                 [name] L [name]

## 2020-08-14 NOTE — Telephone Encounter (Signed)
Check a UA pls - ok a pharmacy test Do a home COVID test Thx

## 2020-08-15 ENCOUNTER — Encounter (HOSPITAL_BASED_OUTPATIENT_CLINIC_OR_DEPARTMENT_OTHER): Payer: Self-pay | Admitting: Otolaryngology

## 2020-08-15 ENCOUNTER — Telehealth: Payer: Self-pay

## 2020-08-15 ENCOUNTER — Encounter: Payer: Self-pay | Admitting: *Deleted

## 2020-08-15 DIAGNOSIS — R519 Headache, unspecified: Secondary | ICD-10-CM | POA: Diagnosis not present

## 2020-08-15 DIAGNOSIS — Z20822 Contact with and (suspected) exposure to covid-19: Secondary | ICD-10-CM | POA: Diagnosis not present

## 2020-08-15 DIAGNOSIS — R61 Generalized hyperhidrosis: Secondary | ICD-10-CM | POA: Diagnosis not present

## 2020-08-15 NOTE — Telephone Encounter (Signed)
This patient has an appt with you on 8/8 at 1pm. She is interested in [place]. She just had a DISE on 08/14/2020 and passed [facility] guideline. Pt will need an updated sleep study. Can order inlab or HST.

## 2020-08-15 NOTE — Telephone Encounter (Signed)
   Patient went to [facility], states all test were negative Bringing results to next ov

## 2020-08-16 ENCOUNTER — Encounter: Payer: Self-pay | Admitting: Neurology

## 2020-08-19 ENCOUNTER — Other Ambulatory Visit (INDEPENDENT_AMBULATORY_CARE_PROVIDER_SITE_OTHER): Payer: Medicare HMO

## 2020-08-19 ENCOUNTER — Encounter: Payer: Self-pay | Admitting: Neurology

## 2020-08-19 ENCOUNTER — Other Ambulatory Visit: Payer: Self-pay

## 2020-08-19 ENCOUNTER — Ambulatory Visit (INDEPENDENT_AMBULATORY_CARE_PROVIDER_SITE_OTHER): Payer: Medicare HMO | Admitting: Neurology

## 2020-08-19 VITALS — BP 116/75 | HR 87 | Ht 64.0 in | Wt 136.0 lb

## 2020-08-19 DIAGNOSIS — Z789 Other specified health status: Secondary | ICD-10-CM | POA: Diagnosis not present

## 2020-08-19 DIAGNOSIS — Z9989 Dependence on other enabling machines and devices: Secondary | ICD-10-CM | POA: Diagnosis not present

## 2020-08-19 DIAGNOSIS — G4733 Obstructive sleep apnea (adult) (pediatric): Secondary | ICD-10-CM

## 2020-08-19 DIAGNOSIS — R634 Abnormal weight loss: Secondary | ICD-10-CM

## 2020-08-19 DIAGNOSIS — E119 Type 2 diabetes mellitus without complications: Secondary | ICD-10-CM | POA: Diagnosis not present

## 2020-08-19 DIAGNOSIS — N183 Chronic kidney disease, stage 3 unspecified: Secondary | ICD-10-CM | POA: Diagnosis not present

## 2020-08-19 LAB — COMPREHENSIVE METABOLIC PANEL
ALT: 22 U/L (ref 0–35)
AST: 20 U/L (ref 0–37)
Albumin: 4.3 g/dL (ref 3.5–5.2)
Alkaline Phosphatase: 71 U/L (ref 39–117)
BUN: 20 mg/dL (ref 6–23)
CO2: 30 mEq/L (ref 19–32)
Calcium: 9.6 mg/dL (ref 8.4–10.5)
Chloride: 102 mEq/L (ref 96–112)
Creatinine, Ser: 0.85 mg/dL (ref 0.40–1.20)
GFR: 67.15 mL/min (ref >60.00)
Glucose, Bld: 87 mg/dL (ref 70–99)
Potassium: 4.4 mEq/L (ref 3.5–5.1)
Sodium: 139 mEq/L (ref 135–145)
Total Bilirubin: 0.6 mg/dL (ref 0.2–1.2)
Total Protein: 7.2 g/dL (ref 6.0–8.3)

## 2020-08-19 LAB — HEMOGLOBIN A1C: Hgb A1c MFr Bld: 5.6 % (ref 4.6–6.5)

## 2020-08-19 NOTE — Progress Notes (Signed)
Subjective:    Patient ID: Tiffany Gibbs is a 75 y.o. female.  HPI    Interim history:   Tiffany Gibbs is a 75 year old right-handed woman with an underlying medical history of anxiety, depression, hypertension, cataracts, osteoarthritis, type 2 diabetes, low back pain, osteoporosis, hyperlipidemia, fibromyalgia, status post right total hip arthroplasty, abdominal hysterectomy, and obesity, who presents for follow-up consultation of her obstructive sleep apnea. I last saw her in a virtual visit on 06/13/2018, at which time she was compliant with her CPAP.  She saw Tiffany Browner, NP in the interim on 06/06/2019 for her neuropathy follow-up, at which time she was using her CPAP.  She was seen for sleep apnea follow-up by Tiffany Browner, NP on 06/21/2019, at which time she was using her CPAP regularly.  She called in the interim in July 2021 reporting that she wanted to look into using inspire.  She reported having difficulty using her CPAP machine.  She saw Dr. Wilburn Cornelia and September 2021 for initial consultation.  She had a follow-up appointment with Dr. Wilburn Cornelia on 07/05/2020, at which time she reported difficulty with CPAP including leakage from the CPAP mask and nasal drainage.  She had a sleep endoscopy on 08/14/2020.  Today, 08/19/2020: She reports having done well with her sleep endoscopy.  She is recuperating from her pain after she had a fall in May 2022.  Thankfully, she sustained no serious injuries such as a fracture or head injury but she did bruise her rib cage and her right hip.  She had to have fluid taken off of her right hip joint.  She has not had a sleep study since 2017.  Her baseline sleep study from 06/25/2015 indicated moderate obstructive sleep apnea with an AHI of 17.2/h, O2 nadir 84%.  She had a subsequent CPAP titration study on 08/12/2015.  Prior sleep study results from March 2003 interpreted by Dr. Humberto Seals indicated severe sleep apnea.  She had a subsequent titration  study in May 2003 and CPAP of 10 cm was recommended at the time.  She has worked on weight loss.  She has been able to keep her weight off the past 2 years.  She is using her CPAP of 10 cm but lately, due to pain and discomfort at night she was only using it sporadically.  I reviewed her CPAP compliance data for the past 30 days from 07/21/2020 through 08/18/2020, she used her machine 90% of the time but average usage for days on treatment is a little over 3 hours.  Average AHI adequate at 0.5/h on a pressure of 10 cm.  She is typically up-to-date with her supplies.  Machine is over 82 years old.  She would be willing to proceed with an updated sleep study in the lab to compare findings from previous sleep study results and since she has been able to lose quite a bit of weight.  Bedtime is generally between 1030 and 11.  Rise time is generally between 7 and 7:30 AM.  She lives with her husband, they have 2 small dogs in the household.  She does not drink caffeine on a daily basis.  She does not drink alcohol.  She hydrates well, typically drinks lemon water.     Previously:  I reviewed her CPAP compliance data from 05/09/2018 through 06/07/2018 which is a total of 30 days, during which time she used her machine every night with percent used days greater than 4 hours at 100%, indicating superb compliance with an  average usage of 7 hours and 10 minutes which is very good, residual AHI at goal at 0.3/h, leak high with a 95th percentile at 50.1 L/min and leak has been noted to be consistently high throughout, pressure at 11 cm with EPR of 3.  I first met her on 05/08/2015 at the request of Dr. Leta Baptist, at which time she reported a prior diagnosis of obstructive sleep apnea.  She was on AutoPap at the time.  She had significant weight loss in the interim.  She was advised to proceed with sleep study testing.  She had a baseline sleep study on 06/25/2015 which showed an AHI of 17.2/h, O2 nadir of 84%.   She had a  subsequent CPAP titration study in the lab on 08/12/2015 which showed great results with a CPAP pressure of 11 cm.   She was seen in the interim by Cecille Rubin, nurse practitioner on 06/09/2016, at which time she was compliant with CPAP.  She was seen by Cecille Rubin, nurse practitioner on 06/04/2017, at which time she was compliant with CPAP and doing well, advised to follow-up in 1 year.   Today, 06/13/2018: Please also see below for virtual visit documentation.   05/08/2015: (She) was previously diagnosed with obstructive sleep apnea many years ago, over 10 years ago. She had a sleep study at [facility] on 03/22/2001 which was interpreted by Dr. Danton Sewer. I reviewed the test results: Overall AHI was 68 per hour, REM sleep was not achieved. Lowest oxygen saturation was 87%. She had a CPAP titration study on 06/02/2001 also with on hospital. CPAP was titrated to 10 cm. He has seen her for recurrent headaches and dizziness. I reviewed your office note from 04/30/2015. She has several tests pending including brain MRI without contrast, brain MRA, neck MRA with and without contrast, echocardiogram. She has been on autoPAP therapy. It started bothering her. She has also lost weight over time. She was 230 pounds in March 2003, current weight is 187 pounds. She has essentially stopped using her AutoPap machine in early February 2015. She was intermittently compliant with it before. I reviewed compliance data from 01/20/2013 through 02/18/2013. She was poorly compliant at the time. She felt that the mask was bothersome and she did not necessarily feel improved in her sleep. She was having very vivid dreams at times. She has gained some weight back. She reports restless leg symptoms which are sometimes quite bothersome to her. Stretching helps. She also changed her bed and mattress and that helped as well. Her Epworth sleepiness score is 14 out of 24 today, her fatigue score is 35 out of 63. She denies  morning headaches or nocturia. She and her husband sleep in separate beds in the same room. She has 2 dogs who don't bother her at night. She has 1 grown son who lives in [place]. Her bedtime is usually between 10:30 and 11 PM. Wakeup time is around 6:30 on most mornings without alarm. She is not sure if she has a family history of obstructive sleep apnea, mother died at 30 with a PE, father died at 12 with a history of MS and after her heart attack. She had a tonsillectomy as a child. She quit smoking in 85, does not drink alcohol and drinks usually 1 cup of coffee in the morning, occasional sodas. She has to take care of her husband who is 15 years older and suffers from dementia and other physical disabilities. She had an echocardiogram earlier this morning,  results are pending. She is scheduled for her MRIs. She is also going to start physical therapy in [place].   Her Past Medical History Is Significant For: Past Medical History:  Diagnosis Date   Anxiety    Cataract    Depression    DM type 2 (diabetes mellitus, type 2) (HCC)    Fibromyalgia    H/O dizziness    Hives    from tomatoes   HTN (hypertension)    Hyperlipidemia    Kidney disease    Low back pain    OSA (obstructive sleep apnea)    uses c-pap   Osteoarthritis    Osteoporosis    SOB (shortness of breath) on exertion     Her Past Surgical History Is Significant For: Past Surgical History:  Procedure Laterality Date   cervical cryotherapy     cervix   CYSTOCELE REPAIR N/A 11/01/2018   Procedure: CYSTOSCOPY ANTERIOR REPAIR (CYSTOCELE);  Surgeon: Bjorn Loser, MD;  Location: [facility];  Service: Urology;  Laterality: N/A;   DRUG INDUCED ENDOSCOPY N/A 08/14/2020   Procedure: DRUG INDUCED ENDOSCOPY;  Surgeon: Jerrell Belfast, MD;  Location: [facility];  Service: ENT;  Laterality: N/A;   EYE SURGERY     Cataracts/Bil   LASIK Bilateral    TOTAL ABDOMINAL HYSTERECTOMY     TOTAL HIP ARTHROPLASTY  09/2008    right- [place]/   TOTAL HIP ARTHROPLASTY Left 11/28/2012   Procedure: TOTAL HIP ARTHROPLASTY;  Surgeon: Kerin Salen, MD;  Location: [facility];  Service: Orthopedics;  Laterality: Left;    Her Family History Is Significant For: Family History  Problem Relation Age of Onset   Hypertension Mother    Diabetes Mother    Other Mother        DVT   Heart disease Father    Multiple sclerosis Father    Heart attack Father    Kidney disease Brother 75       ESRD    Her Social History Is Significant For: Social History   Socioeconomic History   Marital status: Married    Spouse name: Sam   Number of children: 1   Years of education: 12    Highest education level: Not on file  Occupational History   Occupation: N/A  Tobacco Use   Smoking status: Former    Packs/day: 2.00    Years: 25.00    Pack years: 50.00    Types: Cigarettes    Quit date: 01/13/1983    Years since quitting: 37.6   Smokeless tobacco: Never  Vaping Use   Vaping Use: Never used  Substance and Sexual Activity   Alcohol use: No    Alcohol/week: 0.0 standard drinks   Drug use: No   Sexual activity: Not Currently    Birth control/protection: Post-menopausal, Surgical  Other Topics Concern   Not on file  Social History Narrative   Was divorced - newly re-married as of 2009, Sam   1 son      Caffeine use- coffee 1 cup daily    Social Determinants of Health   Financial Resource Strain: Low Risk    Difficulty of Paying Living Expenses: Not hard at all  Food Insecurity: No Food Insecurity   Worried About Charity fundraiser in the Last Year: Never true   [place] in the Last Year: Never true  Transportation Needs: No Transportation Needs   Lack of Transportation (Medical): No   Lack of Transportation (Non-Medical): No  Physical Activity: Sufficiently Active   Days of Exercise per Week: 5 days   Minutes of Exercise per Session: 30 min  Stress: No Stress Concern Present   Feeling of Stress : Not at all   Social Connections: Socially Integrated   Frequency of Communication with Friends and Family: More than three times a week   Frequency of Social Gatherings with Friends and Family: More than three times a week   Attends Religious Services: More than 4 times per year   Active Member of [employer] or Organizations: Yes   Attends Music therapist: More than 4 times per year   Marital Status: Married    Her Allergies Are:  Allergies  Allergen Reactions   Celecoxib     swell   Diazepam     Does not agree with me   Enalapril     cough   Gabapentin Other (See Comments)    dizziness   Losartan     falls   Lovastatin     REACTION: aches   Lyrica [Pregabalin] Other (See Comments)    dizziness   Naproxen    Pravachol [Pravastatin Sodium]     Nausea    Zetia [Ezetimibe]     Myalgias in LEs  :   Her Current Medications Are:  Outpatient Encounter Medications as of 08/19/2020  Medication Sig   Blood Glucose Monitoring Suppl (ONETOUCH VERIO) w/Device KIT Use to check blood sugar daily. DX E11.9   Cyanocobalamin (B-12) 5000 MCG CAPS Take 1 tablet by mouth once a week.    diphenhydrAMINE (BENADRYL) 25 mg capsule Take 25 mg by mouth daily as needed (allergies).    fluticasone (FLONASE) 50 MCG/ACT nasal spray Place 2 sprays into both nostrils daily.   furosemide (LASIX) 20 MG tablet Take 0.5-1 tablets (10-20 mg total) by mouth daily as needed for edema.   glucose blood (ONETOUCH VERIO) test strip USE  STRIP TO CHECK GLUCOSE TWICE DAILY PRN   loratadine (CLARITIN) 10 MG tablet Take 1 tablet (10 mg total) by mouth daily.   magnesium oxide (MAG-OX) 400 MG tablet Take 400 mg by mouth daily.   metFORMIN (GLUCOPHAGE) 500 MG tablet TAKE 1 TABLET BY MOUTH TWICE A DAY WITH A MEAL   Omega-3 Fatty Acids (FISH OIL) 1200 MG CAPS Take 1 capsule by mouth in the morning and at bedtime.   OneTouch Delica Lancets 19J MISC USE 1  TO CHECK GLUCOSE IN THE MORNING AND 1 AT BEDTIME    oxyCODONE-acetaminophen (PERCOCET) 10-325 MG tablet Take 1 tablet by mouth every 6 (six) hours as needed.   potassium chloride (KLOR-CON) 8 MEQ tablet Take 1 tablet (8 mEq total) by mouth 2 (two) times daily.   Turmeric Curcumin 500 MG CAPS Take 1 capsule in the morning and 2 at bedtime   venlafaxine XR (EFFEXOR XR) 37.5 MG 24 hr capsule Take 2 capsules (75 mg total) by mouth daily with breakfast.   No facility-administered encounter medications on file as of 08/19/2020.  :  Review of Systems:  Out of a complete 14 point review of systems, all are reviewed and negative with the exception of these symptoms as listed below:  Review of Systems  Neurological:        Pt here for follow up for CPAP. Pt states she wants to talk to you about inspire.  No issues  Epworth Sleepiness Scale 0= would never doze 1= slight chance of dozing 2= moderate chance of dozing 3= high chance of dozing  Sitting and reading:0 Watching TV:0 Sitting inactive in a public place (ex. Theater or meeting):0 As a passenger in a car for an hour without a break:0 Lying down to rest in the afternoon:1 Sitting and talking to someone:0 Sitting quietly after lunch (no alcohol):0 In a car, while stopped in traffic:0 Total: 1   Objective:  Neurological Exam  Physical Exam Physical Examination:   Vitals:   08/19/20 1252  BP: 116/75  Pulse: 87    General Examination: The patient is a very pleasant 75 y.o. female in no acute distress. She appears well-developed and well-nourished and well groomed.   HEENT: Normocephalic, atraumatic, pupils are equal, round and reactive to light, extraocular tracking is well-preserved, hearing is grossly intact, face is symmetric with normal facial animation.  Speech is clear without dysarthria or hypophonia.  Neck is supple, no carotid bruits.  Airway examination reveals mild mouth dryness, mild airway crowding, due to small airway overall, tongue protrudes centrally and palate  elevates symmetrically.     Chest: Clear to auscultation without wheezing, rhonchi or crackles noted.   Heart: S1+S2+0, regular and normal without murmurs, rubs or gallops noted.   Abdomen: Soft, non-tender and non-distended.   Extremities: There is no pitting edema in the distal lower extremities bilaterally.   Skin: Warm and dry without trophic changes noted.    Musculoskeletal: exam reveals no obvious joint deformities.   Neurologically: Mental status: The patient is awake, alert and oriented in all 4 spheres. Her immediate and remote memory, attention, language skills and fund of knowledge are appropriate. There is no evidence of aphasia, agnosia, apraxia or anomia. Speech is clear with normal prosody and enunciation. Thought process is linear. Mood is normal and affect is normal. Cranial nerves II - XII are as described above under HEENT exam.  Motor exam: Normal bulk, strength and tone is noted. There is no obvious tremor.  Fine motor skills are grossly intact.  Cerebellar testing: No dysmetria or intention tremor. There is no truncal or gait ataxia. Sensory exam: intact to light touch in the upper and lower extremities. Gait, station and balance: She stands easily.  No difficulty walking, no walking aid.                Assessment and Plan:    In summary, REATHA SUR is a very pleasant 75 year old female with an underlying medical history of anxiety, depression, hypertension, cataracts, osteoarthritis, type 2 diabetes, low back pain, osteoporosis, hyperlipidemia, fibromyalgia, status post right total hip arthroplasty, abdominal hysterectomy, and obesity, who presents for follow-up consultation of her obstructive sleep apnea.  She has been on CPAP therapy for years.  In the past 2 years she has been able to lose weight and has been able to keep up.  She has had more difficulty tolerating her CPAP machine over time.  She has recently had a consultation for inspire with Dr. Wilburn Cornelia in  ENT.  She had a sleep endoscopy as well.  She has not had a recent sleep study, last test through our lab was over 5 years ago.  She had moderate obstructive sleep apnea at the time.  She is willing to pursue sleep testing for updating her diagnosis.  If she has only mild residual sleep apnea, she may not need to consider treatment with her CPAP any longer and may not need to pursue surgical treatment either.  We will keep her posted as to her test results.  If she still has sleep apnea in the  moderate range, she may be a good candidate for inspire.  We will also keep Dr. Wilburn Cornelia posted as to her test results.  She is agreeable with this plan.  I answered all her questions today and we will be in touch with her to schedule her sleep study soon.   I spent 30 minutes in total face-to-face time and in reviewing records during pre-charting, more than 50% of which was spent in counseling and coordination of care, reviewing test results, reviewing medications and treatment regimen and/or in discussing or reviewing the diagnosis of OSA, the prognosis and treatment options. Pertinent laboratory and imaging test results that were available during this visit with the patient were reviewed by me and considered in my medical decision making (see chart for details).

## 2020-08-19 NOTE — Patient Instructions (Signed)
It was nice to see you again today.  I am glad to hear that you are feeling better.  You have lost quite a bit of weight over time and has been able to keep it off.  I would like to reevaluate your sleep apnea with a sleep study.  As discussed, if you have really mild sleep apnea at this time, you may not need to be treated with a CPAP machine or with inspire.  Dr. Wilburn Cornelia found that you would be a good candidate for Inspire if you qualify for treatment.  If you continue to have moderate degree of sleep apnea, the Inspire may be a good choice for you.  We will keep you posted as to your test results by phone call and take it from there.

## 2020-08-28 DIAGNOSIS — Z7984 Long term (current) use of oral hypoglycemic drugs: Secondary | ICD-10-CM | POA: Diagnosis not present

## 2020-08-28 DIAGNOSIS — L602 Onychogryphosis: Secondary | ICD-10-CM | POA: Insufficient documentation

## 2020-08-28 DIAGNOSIS — M2012 Hallux valgus (acquired), left foot: Secondary | ICD-10-CM | POA: Diagnosis not present

## 2020-08-28 DIAGNOSIS — E1142 Type 2 diabetes mellitus with diabetic polyneuropathy: Secondary | ICD-10-CM | POA: Diagnosis not present

## 2020-08-28 DIAGNOSIS — M2011 Hallux valgus (acquired), right foot: Secondary | ICD-10-CM | POA: Diagnosis not present

## 2020-08-29 ENCOUNTER — Other Ambulatory Visit: Payer: Self-pay

## 2020-08-29 ENCOUNTER — Encounter: Payer: Self-pay | Admitting: Internal Medicine

## 2020-08-29 ENCOUNTER — Ambulatory Visit (INDEPENDENT_AMBULATORY_CARE_PROVIDER_SITE_OTHER): Payer: Medicare HMO | Admitting: Internal Medicine

## 2020-08-29 VITALS — BP 122/70 | HR 79 | Temp 98.3°F | Ht 64.0 in | Wt 135.2 lb

## 2020-08-29 DIAGNOSIS — E119 Type 2 diabetes mellitus without complications: Secondary | ICD-10-CM

## 2020-08-29 DIAGNOSIS — E785 Hyperlipidemia, unspecified: Secondary | ICD-10-CM | POA: Diagnosis not present

## 2020-08-29 DIAGNOSIS — E889 Metabolic disorder, unspecified: Secondary | ICD-10-CM | POA: Diagnosis not present

## 2020-08-29 DIAGNOSIS — G63 Polyneuropathy in diseases classified elsewhere: Secondary | ICD-10-CM | POA: Diagnosis not present

## 2020-08-29 DIAGNOSIS — N281 Cyst of kidney, acquired: Secondary | ICD-10-CM

## 2020-08-29 DIAGNOSIS — E538 Deficiency of other specified B group vitamins: Secondary | ICD-10-CM | POA: Diagnosis not present

## 2020-08-29 NOTE — Assessment & Plan Note (Addendum)
On diet Monitor A1c Cont on  Metformin

## 2020-08-29 NOTE — Assessment & Plan Note (Signed)
Renal US in 1-2 years

## 2020-08-29 NOTE — Progress Notes (Signed)
Subjective:  Patient ID: Tiffany Gibbs, female    DOB: April 23, 1945  Age: 75 y.o. MRN: [id number]  CC: Follow-up (3 month f/u)   HPI Tiffany Gibbs presents for post-fall R hip pain - better; off Percocet now F/u on dyslipidemia, renal cysts  Outpatient Medications Prior to Visit  Medication Sig Dispense Refill   Blood Glucose Monitoring Suppl (ONETOUCH VERIO) w/Device KIT Use to check blood sugar daily. DX E11.9 1 kit 0   Cyanocobalamin (B-12) 5000 MCG CAPS Take 1 tablet by mouth once a week.      diphenhydrAMINE (BENADRYL) 25 mg capsule Take 25 mg by mouth daily as needed (allergies).      fluticasone (FLONASE) 50 MCG/ACT nasal spray Place 2 sprays into both nostrils daily. 16 g 6   glucose blood (ONETOUCH VERIO) test strip USE  STRIP TO CHECK GLUCOSE TWICE DAILY PRN 200 each 1   loratadine (CLARITIN) 10 MG tablet Take 1 tablet (10 mg total) by mouth daily. 30 tablet 11   magnesium oxide (MAG-OX) 400 MG tablet Take 400 mg by mouth daily.     metFORMIN (GLUCOPHAGE) 500 MG tablet TAKE 1 TABLET BY MOUTH TWICE A DAY WITH A MEAL 180 tablet 2   Omega-3 Fatty Acids (FISH OIL) 1200 MG CAPS Take 1 capsule by mouth in the morning and at bedtime.     OneTouch Delica Lancets 40J MISC USE 1  TO CHECK GLUCOSE IN THE MORNING AND 1 AT BEDTIME 200 each 1   potassium chloride (KLOR-CON) 8 MEQ tablet Take 1 tablet (8 mEq total) by mouth 2 (two) times daily. 180 tablet 3   Turmeric Curcumin 500 MG CAPS Take 1 capsule in the morning and 2 at bedtime     venlafaxine XR (EFFEXOR XR) 37.5 MG 24 hr capsule Take 2 capsules (75 mg total) by mouth daily with breakfast. 180 capsule 1   furosemide (LASIX) 20 MG tablet Take 0.5-1 tablets (10-20 mg total) by mouth daily as needed for edema. (Patient not taking: Reported on 08/29/2020) 30 tablet 3   oxyCODONE-acetaminophen (PERCOCET) 10-325 MG tablet Take 1 tablet by mouth every 6 (six) hours as needed. (Patient not taking: Reported on 08/29/2020) 60  tablet 0   No facility-administered medications prior to visit.    ROS: Review of Systems  Constitutional:  Negative for activity change, appetite change, chills, fatigue and unexpected weight change.  HENT:  Negative for congestion, mouth sores and sinus pressure.   Eyes:  Negative for visual disturbance.  Respiratory:  Negative for cough and chest tightness.   Gastrointestinal:  Negative for abdominal pain and nausea.  Genitourinary:  Negative for difficulty urinating, frequency and vaginal pain.  Musculoskeletal:  Positive for arthralgias. Negative for back pain and gait problem.  Skin:  Negative for pallor and rash.  Neurological:  Negative for dizziness, tremors, weakness, numbness and headaches.  Psychiatric/Behavioral:  Negative for confusion and sleep disturbance.    Objective:  BP 122/70 (BP Location: Left Arm)   Pulse 79   Temp 98.3 F (36.8 C) (Oral)   Ht _0  (1.626 m)   Wt 135 lb 3.2 oz (61.3 kg)   SpO2 99%   BMI 23.21 kg/m   BP Readings from Last 3 Encounters:  08/29/20 122/70  08/19/20 116/75  08/14/20 136/72    Wt Readings from Last 3 Encounters:  08/29/20 135 lb 3.2 oz (61.3 kg)  08/19/20 136 lb (61.7 kg)  08/14/20 135 lb 12.9 oz (61.6 kg)  Physical Exam Constitutional:      General: She is not in acute distress.    Appearance: She is well-developed.  HENT:     Head: Normocephalic.     Right Ear: External ear normal.     Left Ear: External ear normal.     Nose: Nose normal.  Eyes:     General:        Right eye: No discharge.        Left eye: No discharge.     Conjunctiva/sclera: Conjunctivae normal.     Pupils: Pupils are equal, round, and reactive to light.  Neck:     Thyroid: No thyromegaly.     Vascular: No JVD.     Trachea: No tracheal deviation.  Cardiovascular:     Rate and Rhythm: Normal rate and regular rhythm.     Heart sounds: Normal heart sounds.  Pulmonary:     Effort: No respiratory distress.     Breath sounds: No  stridor. No wheezing.  Abdominal:     General: Bowel sounds are normal. There is no distension.     Palpations: Abdomen is soft. There is no mass.     Tenderness: There is no abdominal tenderness. There is no guarding or rebound.  Musculoskeletal:        General: Tenderness present.     Cervical back: Normal range of motion and neck supple. No rigidity.  Lymphadenopathy:     Cervical: No cervical adenopathy.  Skin:    Findings: No erythema or rash.  Neurological:     Cranial Nerves: No cranial nerve deficit.     Motor: No abnormal muscle tone.     Coordination: Coordination normal.     Deep Tendon Reflexes: Reflexes normal.  Psychiatric:        Behavior: Behavior normal.        Thought Content: Thought content normal.        Judgment: Judgment normal.   R hip w/less pain   Lab Results  Component Value Date   WBC 5.9 01/06/2020   HGB 13.8 01/06/2020   HCT 41.5 01/06/2020   PLT 215 01/06/2020   GLUCOSE 87 08/19/2020   CHOL 199 03/15/2020   TRIG 57.0 03/15/2020   HDL 69.40 03/15/2020   LDLDIRECT 148.2 02/03/2013   LDLCALC 119 (H) 03/15/2020   ALT 22 08/19/2020   AST 20 08/19/2020   NA 139 08/19/2020   K 4.4 08/19/2020   CL 102 08/19/2020   CREATININE 0.85 08/19/2020   BUN 20 08/19/2020   CO2 30 08/19/2020   TSH 1.37 04/17/2020   INR 1.0 10/28/2018   HGBA1C 5.6 08/19/2020   MICROALBUR 1.6 04/17/2020     Assessment & Plan:    Walker Kehr, MD

## 2020-08-29 NOTE — Assessment & Plan Note (Signed)
Cont w/Vit B12 

## 2020-08-29 NOTE — Patient Instructions (Addendum)
  TENS Unit Muscle Stimulator Electric Shock Therapy for Muscles Dual Channel TENS EMS Unit Electronic Pulse Massager with 24 Modes Physical Therapy Equipment.  [facility]OK[facility] recovery slides

## 2020-08-29 NOTE — Assessment & Plan Note (Addendum)
Not better [place] shoes Try TENS unit

## 2020-08-30 DIAGNOSIS — R3129 Other microscopic hematuria: Secondary | ICD-10-CM | POA: Diagnosis not present

## 2020-08-30 DIAGNOSIS — N1831 Chronic kidney disease, stage 3a: Secondary | ICD-10-CM | POA: Diagnosis not present

## 2020-08-30 DIAGNOSIS — I129 Hypertensive chronic kidney disease with stage 1 through stage 4 chronic kidney disease, or unspecified chronic kidney disease: Secondary | ICD-10-CM | POA: Diagnosis not present

## 2020-08-30 DIAGNOSIS — Z9989 Dependence on other enabling machines and devices: Secondary | ICD-10-CM | POA: Diagnosis not present

## 2020-08-30 DIAGNOSIS — G4733 Obstructive sleep apnea (adult) (pediatric): Secondary | ICD-10-CM | POA: Diagnosis not present

## 2020-08-30 DIAGNOSIS — N182 Chronic kidney disease, stage 2 (mild): Secondary | ICD-10-CM | POA: Diagnosis not present

## 2020-08-30 DIAGNOSIS — N390 Urinary tract infection, site not specified: Secondary | ICD-10-CM | POA: Diagnosis not present

## 2020-08-30 DIAGNOSIS — N281 Cyst of kidney, acquired: Secondary | ICD-10-CM | POA: Diagnosis not present

## 2020-08-30 DIAGNOSIS — E1122 Type 2 diabetes mellitus with diabetic chronic kidney disease: Secondary | ICD-10-CM | POA: Diagnosis not present

## 2020-09-02 ENCOUNTER — Telehealth: Payer: Self-pay | Admitting: Internal Medicine

## 2020-09-02 ENCOUNTER — Telehealth: Payer: Self-pay

## 2020-09-02 NOTE — Telephone Encounter (Signed)
Pt stated Dr. Alain Marion had written on her AVS that she had a Vitamin b-12 deficiency. Wondering if she increase her B12 dosage?   Please advise.

## 2020-09-02 NOTE — Telephone Encounter (Signed)
LVM for pt to call me back to schedule sleep study  

## 2020-09-03 NOTE — Telephone Encounter (Signed)
Notified pt w/MD response.../lmb 

## 2020-09-03 NOTE — Telephone Encounter (Signed)
No.  Stay on the same dose.  Thanks

## 2020-09-04 ENCOUNTER — Telehealth: Payer: Self-pay | Admitting: Neurology

## 2020-09-04 NOTE — Telephone Encounter (Signed)
Per OV on 08/19/20, Dr Rexene Alberts advised patient should be reevaluated with another sleep study and depending on those results she may recommend treatment with Inspire.

## 2020-09-04 NOTE — Telephone Encounter (Signed)
Spoke with patient. She wanted Dr Rexene Alberts to know she is a mouth breather and asked if this would hinder her qualification for [facility].

## 2020-09-04 NOTE — Telephone Encounter (Signed)
Spoke with Dr Rexene Alberts. No suggestion that Tiffany Gibbs is contraindicated with mouth breathing. Patient can also d/w surgeon. I talked to the patient. She verbalized understanding and appreciation for the follow-up. Will await HST results.

## 2020-09-04 NOTE — Telephone Encounter (Signed)
Pt is asking for a call to discuss questions re: Inspire and if that would be beneficial to her.

## 2020-09-11 DIAGNOSIS — H5213 Myopia, bilateral: Secondary | ICD-10-CM | POA: Diagnosis not present

## 2020-09-11 DIAGNOSIS — H524 Presbyopia: Secondary | ICD-10-CM | POA: Diagnosis not present

## 2020-09-11 DIAGNOSIS — H35363 Drusen (degenerative) of macula, bilateral: Secondary | ICD-10-CM | POA: Diagnosis not present

## 2020-09-11 DIAGNOSIS — E119 Type 2 diabetes mellitus without complications: Secondary | ICD-10-CM | POA: Diagnosis not present

## 2020-09-11 DIAGNOSIS — H04123 Dry eye syndrome of bilateral lacrimal glands: Secondary | ICD-10-CM | POA: Diagnosis not present

## 2020-09-11 DIAGNOSIS — H43813 Vitreous degeneration, bilateral: Secondary | ICD-10-CM | POA: Diagnosis not present

## 2020-09-11 DIAGNOSIS — D3131 Benign neoplasm of right choroid: Secondary | ICD-10-CM | POA: Diagnosis not present

## 2020-09-11 DIAGNOSIS — Z961 Presence of intraocular lens: Secondary | ICD-10-CM | POA: Diagnosis not present

## 2020-09-11 DIAGNOSIS — H52203 Unspecified astigmatism, bilateral: Secondary | ICD-10-CM | POA: Diagnosis not present

## 2020-09-11 DIAGNOSIS — Z7984 Long term (current) use of oral hypoglycemic drugs: Secondary | ICD-10-CM | POA: Diagnosis not present

## 2020-09-16 ENCOUNTER — Encounter: Payer: Self-pay | Admitting: Internal Medicine

## 2020-09-23 ENCOUNTER — Telehealth: Payer: Self-pay

## 2020-09-23 DIAGNOSIS — N183 Chronic kidney disease, stage 3 unspecified: Secondary | ICD-10-CM

## 2020-09-23 NOTE — Telephone Encounter (Signed)
Pt is asking that you please add on a urine test to her labs to be done in November for December apptmnt. Dr. Marval Regal had ran labs on pt and she states her urine came back abnormal and has concerns about it and wants another urine test done from her PCP.

## 2020-09-23 NOTE — Telephone Encounter (Signed)
Okay urinalysis.  Ordered.  Thanks

## 2020-09-25 ENCOUNTER — Ambulatory Visit (INDEPENDENT_AMBULATORY_CARE_PROVIDER_SITE_OTHER): Payer: Medicare HMO | Admitting: Neurology

## 2020-09-25 DIAGNOSIS — G4733 Obstructive sleep apnea (adult) (pediatric): Secondary | ICD-10-CM

## 2020-09-25 DIAGNOSIS — R634 Abnormal weight loss: Secondary | ICD-10-CM

## 2020-09-25 DIAGNOSIS — Z9989 Dependence on other enabling machines and devices: Secondary | ICD-10-CM

## 2020-09-25 DIAGNOSIS — Z789 Other specified health status: Secondary | ICD-10-CM

## 2020-09-27 NOTE — Procedures (Signed)
   [facility]  HOME SLEEP TEST (Watch PAT) REPORT  STUDY DATE: 09/25/20  DOB: 04-19-1945  MRN: [id number]  ORDERING CLINICIAN: Star Age, MD, PhD   REFERRING CLINICIAN: Dr. Wilburn Cornelia  CLINICAL INFORMATION/HISTORY: 75 year old woman with a history of anxiety, depression, hypertension, cataracts, osteoarthritis, type 2 diabetes, low back pain, osteoporosis, hyperlipidemia, fibromyalgia, status post right total hip arthroplasty, abdominal hysterectomy, and obesity, who presents for re-evaluation of her obstructive sleep apnea. She has had difficulty tolerating her CPAP and is interested in [place].   Epworth sleepiness score: 1/24.  BMI: 23.3 kg/m  FINDINGS:   Sleep Summary:   Total Recording Time (hours, min): 9 hours, 52 minutes  Total Sleep Time (hours, min):  7 hours, 51 minutes   Percent REM (%):    7.5%   Respiratory Indices:   Calculated pAHI (per hour):  6.4/hour         REM pAHI:    11.2/hour       NREM pAHI: 6/hour  Oxygen Saturation Statistics:    Oxygen Saturation (%) Mean: 95%   Minimum oxygen saturation (%):                 83%   O2 Saturation Range (%): 83 - 100%    O2 Saturation (minutes) <=88%: 0.3 min  Pulse Rate Statistics:   Pulse Mean (bpm):    64/min    Pulse Range (47 - 92/min)   IMPRESSION: OSA (obstructive sleep apnea), mild  RECOMMENDATION:  This home sleep test demonstrates overall mild obstructive sleep apnea with a total AHI of 6.4/hour and O2 nadir of 83%. Treatment options may include ongoing use of positive airway pressure, a dental device or surgical options, in appropriate candidates. Please note that untreated obstructive sleep apnea may carry additional perioperative morbidity. Patients with significant obstructive sleep apnea should receive perioperative PAP therapy and the surgeons and particularly the anesthesiologist should be informed of the diagnosis and the severity of the sleep disordered  breathing. The patient should be cautioned not to drive, work at heights, or operate dangerous or heavy equipment when tired or sleepy. Review and reiteration of good sleep hygiene measures should be pursued with any patient. Other causes of the patient's symptoms, including circadian rhythm disturbances, an underlying mood disorder, medication effect and/or an underlying medical problem cannot be ruled out based on this test. Clinical correlation is recommended.  The patient and her referring provider will be notified of the test results. The patient will be seen in follow up in sleep clinic at [facility].  I certify that I have reviewed the raw data recording prior to the issuance of this report in accordance with the standards of the American Academy of Sleep Medicine (AASM).  INTERPRETING PHYSICIAN:   Star Age, MD, PhD  Board Certified in Neurology and Sleep Medicine  [facility] [address] [place], [place] [address] [phone]

## 2020-09-27 NOTE — Progress Notes (Signed)
See procedure note.

## 2020-10-01 ENCOUNTER — Telehealth: Payer: Self-pay | Admitting: *Deleted

## 2020-10-01 DIAGNOSIS — G4733 Obstructive sleep apnea (adult) (pediatric): Secondary | ICD-10-CM

## 2020-10-01 NOTE — Telephone Encounter (Signed)
Spoke with patient and discussed sleep study results.  She understands her recent home sleep test showed obstructive sleep apnea in the mild range.  She understands typically inspire is indicated for moderate to severe OSA.  We will send the report to Dr. Wilburn Cornelia.  She can discuss further with him.  Also presented options if she would like she can restart her CPAP, also may want to seek evaluation with a dentist for a dental device.  Patient stated she will use her CPAP and will explore these other options such as the inspire with Dr. Wilburn Cornelia and also would like a referral to a dentist for dental device.  Her questions were answered.  She will give [place] a call back when she has her calendar to schedule a 57-month follow-up.   Referral placed to Dr Ron Parker for consult for dental device. Results sent to Dr Wilburn Cornelia.

## 2020-10-01 NOTE — Telephone Encounter (Signed)
-----   Message from Star Age, MD sent at 09/27/2020 12:27 PM EDT ----- Patient referred by Dr. Wilburn Cornelia for re-evaluation of her OSA with CPAP intolerance. She is interested in pursuing Inspire. I saw her on 08/19/20, she had a HST on 09/25/20.  Please call and notify the patient that the recent home sleep test showed obstructive sleep apnea. OSA is in the mild range. Typically, Dawna Part is indicated for moderate to severe OSA. I would recommend, she discuss this further with Dr. Wilburn Cornelia. Please send report to Dr. Wilburn Cornelia as well. If she would like to restart CPAP she can schedule a FU with MM or myself in about 3 months. Otherwise, she can FU with [place] as needed.  Alternatively, she may also want to seek evaluation with a dentist for a dental device for OSA treatment.   Star Age, MD, PhD [facility])

## 2020-10-02 ENCOUNTER — Telehealth: Payer: Self-pay | Admitting: Neurology

## 2020-10-02 NOTE — Telephone Encounter (Signed)
I returned the pt's calls and answered her questions about the sleep study results. She will call Dr Victorio Palm office to make an appt. She will wait for Dr Kae Heller office to call to schedule. She also went ahead and scheduled a 3 month f/u with Dr Rexene Alberts for [facility] 12/26/20 @ 10:45 AM arrival 10:15. Pt verbalized appreciation for the call.

## 2020-10-02 NOTE — Telephone Encounter (Signed)
Sent referral to Dr. Ron Parker ph # [phone].

## 2020-10-02 NOTE — Telephone Encounter (Signed)
Patient called and left a voicemail on my phone stating that she would like a call back to go over her test results because she cannot remember anything.

## 2020-10-14 ENCOUNTER — Encounter: Payer: Self-pay | Admitting: *Deleted

## 2020-10-30 ENCOUNTER — Telehealth: Payer: Self-pay | Admitting: *Deleted

## 2020-10-30 NOTE — Telephone Encounter (Signed)
Pt called Tiffany Gibbs due to not hearing back from Dr. Danie Binder office about the results that he had given her recently.  Questions about AHI 6.4 events per hour and nadir O2 83%.  I went over this with her.  She spoke of seeing Dr. Ron Parker and will be getting a oral device for her sleep apnea.  She really like him (explained things on her level).  She asked about a pulse ox for her finger.  I relayed that her using it at night and her being asleep would not be beneficial as she would not be aware of the level for these finger held monitors.  I relayed that she can call back for any other questions that she has.  She appreciated call back.  Has appt with Tiffany Gibbs in December 2022.

## 2020-11-13 DIAGNOSIS — G4733 Obstructive sleep apnea (adult) (pediatric): Secondary | ICD-10-CM | POA: Diagnosis not present

## 2020-11-28 DIAGNOSIS — Z1231 Encounter for screening mammogram for malignant neoplasm of breast: Secondary | ICD-10-CM | POA: Diagnosis not present

## 2020-11-28 LAB — HM MAMMOGRAPHY

## 2020-11-29 ENCOUNTER — Other Ambulatory Visit: Payer: Self-pay | Admitting: Internal Medicine

## 2020-12-02 DIAGNOSIS — E1142 Type 2 diabetes mellitus with diabetic polyneuropathy: Secondary | ICD-10-CM | POA: Diagnosis not present

## 2020-12-02 DIAGNOSIS — L602 Onychogryphosis: Secondary | ICD-10-CM | POA: Diagnosis not present

## 2020-12-02 DIAGNOSIS — Z7984 Long term (current) use of oral hypoglycemic drugs: Secondary | ICD-10-CM | POA: Diagnosis not present

## 2020-12-04 ENCOUNTER — Encounter: Payer: Self-pay | Admitting: Internal Medicine

## 2020-12-10 ENCOUNTER — Ambulatory Visit (INDEPENDENT_AMBULATORY_CARE_PROVIDER_SITE_OTHER): Payer: Medicare HMO | Admitting: Internal Medicine

## 2020-12-10 ENCOUNTER — Encounter: Payer: Self-pay | Admitting: Internal Medicine

## 2020-12-10 VITALS — BP 112/60 | HR 82 | Ht 62.0 in | Wt 148.1 lb

## 2020-12-10 DIAGNOSIS — Z86010 Personal history of colonic polyps: Secondary | ICD-10-CM

## 2020-12-10 NOTE — Progress Notes (Signed)
Patient ID: Adya Wirz, female   DOB: [birth year], 75 y.o.   MRN: [id number] HPI: Davona Kinoshita is a 75 year old female with a history of nonadvanced adenomatous colon polyps, and other medical history as below who presents to discuss surveillance colonoscopy.  She is here alone today.  She is known to me from colonoscopy performed for screening on 09/09/2015.  This revealed a 4 mm sigmoid polyp found to be adenomatous without high-grade dysplasia.  There were small internal hemorrhoids and exam was otherwise normal.  She does not have family history of colorectal cancer.  She reports she is feeling well and has no abdominal complaints including no recent change in bowel habits.  No blood in stool or melena.  No abdominal pain.  No upper GI or hepatobiliary complaint.  She lives in [place].  Past Medical History:  Diagnosis Date   Anxiety    Cataract    Depression    DM type 2 (diabetes mellitus, type 2) (HCC)    Fibromyalgia    H/O dizziness    Hives    from tomatoes   HTN (hypertension)    Hyperlipidemia    Internal hemorrhoids    Kidney disease    Low back pain    OSA (obstructive sleep apnea)    uses c-pap   Osteoarthritis    Osteoporosis    SOB (shortness of breath) on exertion    Tubular adenoma of colon     Past Surgical History:  Procedure Laterality Date   cervical cryotherapy     cervix   CYSTOCELE REPAIR N/A 11/01/2018   Procedure: CYSTOSCOPY ANTERIOR REPAIR (CYSTOCELE);  Surgeon: Bjorn Loser, MD;  Location: [facility];  Service: Urology;  Laterality: N/A;   DRUG INDUCED ENDOSCOPY N/A 08/14/2020   Procedure: DRUG INDUCED ENDOSCOPY;  Surgeon: Jerrell Belfast, MD;  Location: [facility];  Service: ENT;  Laterality: N/A;   EYE SURGERY     Cataracts/Bil   LASIK Bilateral    TOTAL ABDOMINAL HYSTERECTOMY     TOTAL HIP ARTHROPLASTY  09/2008   right- [place]/   TOTAL HIP ARTHROPLASTY Left 11/28/2012   Procedure: TOTAL HIP ARTHROPLASTY;   Surgeon: Kerin Salen, MD;  Location: [facility];  Service: Orthopedics;  Laterality: Left;    Outpatient Medications Prior to Visit  Medication Sig Dispense Refill   Blood Glucose Monitoring Suppl (ONETOUCH VERIO) w/Device KIT Use to check blood sugar daily. DX E11.9 1 kit 0   Cyanocobalamin (B-12) 5000 MCG CAPS Take 1 tablet by mouth once a week.      diphenhydrAMINE (BENADRYL) 25 mg capsule Take 25 mg by mouth daily as needed (allergies).      fluticasone (FLONASE) 50 MCG/ACT nasal spray Place 2 sprays into both nostrils daily. 16 g 6   glucose blood (ONETOUCH VERIO) test strip USE  STRIP TO CHECK GLUCOSE TWICE DAILY PRN 200 each 1   loratadine (CLARITIN) 10 MG tablet Take 1 tablet (10 mg total) by mouth daily. 30 tablet 11   magnesium oxide (MAG-OX) 400 MG tablet Take 400 mg by mouth daily.     metFORMIN (GLUCOPHAGE) 500 MG tablet TAKE 1 TABLET BY MOUTH TWICE A DAY WITH A MEAL 180 tablet 2   Omega-3 Fatty Acids (FISH OIL) 1200 MG CAPS Take 1 capsule by mouth in the morning and at bedtime.     OneTouch Delica Lancets 02D MISC USE 1  TO CHECK GLUCOSE IN THE MORNING AND 1 AT BEDTIME 200 each 0   potassium  chloride (KLOR-CON) 8 MEQ tablet Take 1 tablet (8 mEq total) by mouth 2 (two) times daily. 180 tablet 3   Turmeric Curcumin 500 MG CAPS Take 1 capsule in the morning and 2 at bedtime     venlafaxine XR (EFFEXOR XR) 37.5 MG 24 hr capsule Take 2 capsules (75 mg total) by mouth daily with breakfast. 180 capsule 1   No facility-administered medications prior to visit.    Allergies  Allergen Reactions   Celecoxib     swell   Diazepam     Does not agree with me   Enalapril     cough   Gabapentin Other (See Comments)    dizziness   Losartan     falls   Lovastatin     REACTION: aches   Lyrica [Pregabalin] Other (See Comments)    dizziness   Naproxen    Pravachol [Pravastatin Sodium]     Nausea    Zetia [Ezetimibe]     Myalgias in LEs    Family History  Problem Relation Age of  Onset   Hypertension Mother    Diabetes Mother    Other Mother        DVT   Heart disease Father    Multiple sclerosis Father    Heart attack Father    Kidney disease Brother 23       ESRD    Social History   Tobacco Use   Smoking status: Former    Packs/day: 2.00    Years: 25.00    Pack years: 50.00    Types: Cigarettes    Quit date: 01/13/1983    Years since quitting: 37.9   Smokeless tobacco: Never  Vaping Use   Vaping Use: Never used  Substance Use Topics   Alcohol use: No    Alcohol/week: 0.0 standard drinks   Drug use: No    ROS: As per history of present illness, otherwise negative  Gen: awake, alert, NAD HEENT: anicteric CV: RRR, no mrg Pulm: CTA b/l Abd: soft, NT/ND, +BS throughout Ext: no c/c/e Neuro: nonfocal   RELEVANT LABS AND IMAGING: CBC    Component Value Date/Time   WBC 5.9 01/06/2020 1338   RBC 4.54 01/06/2020 1338   HGB 13.8 01/06/2020 1338   HCT 41.5 01/06/2020 1338   PLT 215 01/06/2020 1338   MCV 91.4 01/06/2020 1338   MCH 30.4 01/06/2020 1338   MCHC 33.3 01/06/2020 1338   RDW 12.4 01/06/2020 1338   LYMPHSABS 1,283 08/29/2019 0814   MONOABS 0.5 01/19/2019 1022   EOSABS 83 08/29/2019 0814   BASOSABS 48 08/29/2019 0814    CMP     Component Value Date/Time   NA 139 08/19/2020 1016   K 4.4 08/19/2020 1016   CL 102 08/19/2020 1016   CO2 30 08/19/2020 1016   GLUCOSE 87 08/19/2020 1016   BUN 20 08/19/2020 1016   CREATININE 0.85 08/19/2020 1016   CREATININE 0.99 (H) 08/29/2019 0814   CALCIUM 9.6 08/19/2020 1016   PROT 7.2 08/19/2020 1016   PROT 7.5 06/06/2019 1350   ALBUMIN 4.3 08/19/2020 1016   AST 20 08/19/2020 1016   ALT 22 08/19/2020 1016   ALKPHOS 71 08/19/2020 1016   BILITOT 0.6 08/19/2020 1016   GFRNONAA >60 08/09/2020 1306   GFRNONAA 56 (L) 08/29/2019 0814   GFRAA 65 08/29/2019 0814    ASSESSMENT/PLAN: 75 year old female with a history of nonadvanced adenomatous colon polyps, and other medical history as below  who presents to discuss surveillance colonoscopy.  Personal history of adenoma of the colon -nonadvanced adenoma, 4 mm in size, removed in August 2017.  At the time national surveillance guidelines support 5-year recall but now support 7-year recall.  We also discussed that colonoscopy for screening and surveillance typically stops around age 48.  Medically she remains well and appropriate for surveillance colonoscopy.  She is comfortable as am I with changing recall to the new guidelines of 7 years. --Change recall colonoscopy to August 2024 for polyp surveillance --Follow-up as needed prior to this     HE:[id number], Evie Lacks, [place],  [place] [id number]

## 2020-12-10 NOTE — Patient Instructions (Signed)
You will be due for a recall colonoscopy in August 2024. We will send you a reminder in the mail when it gets closer to that time.  If you are age 75 or older, your body mass index should be between 23-30. Your Body mass index is 27.09 kg/m. If this is out of the aforementioned range listed, please consider follow up with your Primary Care Provider.  If you are age 49 or younger, your body mass index should be between 19-25. Your Body mass index is 27.09 kg/m. If this is out of the aformentioned range listed, please consider follow up with your Primary Care Provider.   ________________________________________________________  The [facility] providers would like to encourage you to use [facility] to communicate with providers for non-urgent requests or questions.  Due to long hold times on the telephone, sending your provider a message by [facility] may be a faster and more efficient way to get a response.  Please allow 48 business hours for a response.  Please remember that this is for non-urgent requests.  _______________________________________________________  Thank you for choosing me and [facility].  Dr. Hilarie Fredrickson

## 2020-12-11 ENCOUNTER — Encounter: Payer: Self-pay | Admitting: Neurology

## 2020-12-13 ENCOUNTER — Ambulatory Visit: Admitting: Internal Medicine

## 2020-12-13 ENCOUNTER — Telehealth: Payer: Self-pay | Admitting: Neurology

## 2020-12-13 NOTE — Telephone Encounter (Signed)
Pt called asking if she could have another sleep study, she states her first one did not go well and she didn't have a good score. Therefore her insurance denied her sleep appliance. Pt requesting a call back.

## 2020-12-16 NOTE — Telephone Encounter (Signed)
Spoke with the patient. She is asking about possibly repeating the test because her dental device was not covered by insurance. She did not feel that she slept much during the night of her home sleep test.  She said she was up and down multiple times. I did let her know that we have on record in the results that she had 7 hours and 51 minutes of sleep time. She was surprised that it only showed mild sleep apnea.  She did get her oral appliance and she likes it better than the mask.  She says she has used her CPAP off and on since her August visit.  She was concerned about only getting 30 days of noncompliant data (<4 hours) back in August.  She will bring her machine and power cord to the next visit.  I let her know that a message will be sent to Dr. Rexene Alberts but otherwise we will plan on seeing her at her scheduled appointment for next Thursday, December 15 at 10:45 AM. The patient was very appreciative.  I attempted to answer as many questions as I could and she will discuss the rest with Dr. Rexene Alberts at the office visit.

## 2020-12-26 ENCOUNTER — Ambulatory Visit (INDEPENDENT_AMBULATORY_CARE_PROVIDER_SITE_OTHER): Payer: Medicare HMO | Admitting: Neurology

## 2020-12-26 ENCOUNTER — Encounter: Payer: Self-pay | Admitting: Neurology

## 2020-12-26 ENCOUNTER — Other Ambulatory Visit (INDEPENDENT_AMBULATORY_CARE_PROVIDER_SITE_OTHER): Payer: Medicare HMO

## 2020-12-26 VITALS — BP 128/70 | HR 67 | Ht 64.0 in | Wt 149.0 lb

## 2020-12-26 DIAGNOSIS — E785 Hyperlipidemia, unspecified: Secondary | ICD-10-CM

## 2020-12-26 DIAGNOSIS — Z789 Other specified health status: Secondary | ICD-10-CM | POA: Diagnosis not present

## 2020-12-26 DIAGNOSIS — N183 Chronic kidney disease, stage 3 unspecified: Secondary | ICD-10-CM

## 2020-12-26 DIAGNOSIS — E119 Type 2 diabetes mellitus without complications: Secondary | ICD-10-CM

## 2020-12-26 DIAGNOSIS — G4733 Obstructive sleep apnea (adult) (pediatric): Secondary | ICD-10-CM

## 2020-12-26 DIAGNOSIS — R634 Abnormal weight loss: Secondary | ICD-10-CM

## 2020-12-26 LAB — URINALYSIS, ROUTINE W REFLEX MICROSCOPIC
Bilirubin Urine: NEGATIVE
Ketones, ur: NEGATIVE
Nitrite: NEGATIVE
Specific Gravity, Urine: >=1.03 — AB (ref 1.000–1.030)
Total Protein, Urine: NEGATIVE
Urine Glucose: NEGATIVE
Urobilinogen, UA: 0.2 (ref 0.0–1.0)
pH: 5.5 (ref 5.0–8.0)

## 2020-12-26 LAB — COMPREHENSIVE METABOLIC PANEL
ALT: 13 U/L (ref 0–35)
AST: 15 U/L (ref 0–37)
Albumin: 4.1 g/dL (ref 3.5–5.2)
Alkaline Phosphatase: 66 U/L (ref 39–117)
BUN: 19 mg/dL (ref 6–23)
CO2: 28 mEq/L (ref 19–32)
Calcium: 9.6 mg/dL (ref 8.4–10.5)
Chloride: 104 mEq/L (ref 96–112)
Creatinine, Ser: 1 mg/dL (ref 0.40–1.20)
GFR: 55.12 mL/min — ABNORMAL LOW (ref >60.00)
Glucose, Bld: 84 mg/dL (ref 70–99)
Potassium: 4.1 mEq/L (ref 3.5–5.1)
Sodium: 141 mEq/L (ref 135–145)
Total Bilirubin: 0.6 mg/dL (ref 0.2–1.2)
Total Protein: 7 g/dL (ref 6.0–8.3)

## 2020-12-26 LAB — LIPID PANEL
Cholesterol: 191 mg/dL (ref 0–200)
HDL: 74.8 mg/dL (ref >39.00)
LDL Cholesterol: 103 mg/dL — ABNORMAL HIGH (ref 0–99)
NonHDL: 116.58
Total CHOL/HDL Ratio: 3
Triglycerides: 70 mg/dL (ref 0.0–149.0)
VLDL: 14 mg/dL (ref 0.0–40.0)

## 2020-12-26 LAB — HEMOGLOBIN A1C: Hgb A1c MFr Bld: 5.7 % (ref 4.6–6.5)

## 2020-12-26 NOTE — Progress Notes (Signed)
Subjective:    Patient ID: Tiffany Gibbs is a 75 y.o. female.  HPI    Interim history:   Tiffany Gibbs is a 75 year old right-handed woman with an underlying medical history of anxiety, depression, hypertension, cataracts, osteoarthritis, type 2 diabetes, low back pain, osteoporosis, hyperlipidemia, fibromyalgia, status post right total hip arthroplasty, abdominal hysterectomy, and obesity, who presents for follow-up consultation of her obstructive sleep apnea. I last saw her on 08/19/2020, at which time she had recently undergone sleep endoscopy under ENT.  She had sustained a fall in May 2022.  She had prior sleep testing in June and July 2017.  She had lost weight. She was having trouble with her CPAP and had difficulty tolerating it.  We mutually agreed to proceed with sleep testing.  She had an interim home sleep test on 03/19/2020 which indicated mild obstructive sleep apnea with an AHI of 6.4/h, O2 nadir 83%.  She decided to pursue treatment with an oral appliance.  Today, 12/26/2020: She reports that she started using her oral appliance on 12/05/2020.  She had seen Dr. Ron Parker for this.  She has had some usage with her CPAP machine of 10 cm.  In fact, I reviewed compliance data from the month of November and she used her machine 26 out of 30 days.  The day after she started her oral appliance she actually switched back for another 4 days to her CPAP but she was not sure she was using her oral appliance correctly but Dr. Ron Parker verify that she was actually using it correctly.  Residual AHI when she used her CPAP of 10 cm was 0.4/h, leak on the higher side with a 95th percentile at 31.7 L/min.  She reports still having difficulty with her CPAP.  She has used her oral appliance but her insurance denied payment on it.  She questions the validity of her home sleep test as it indicates an approximate sleep time of over 7 hours.  She reports that she never sleeps that much.  At home, when she tried her  sleep recently with a diary she reports that she gets about 4 to 5 hours of sleep on an average night, it takes her a long time to fall asleep.  She tries to go to bed consistently at the same time but she may be up for a few hours before falling asleep and also in the middle of the night.  She would like to pursue an in lab sleep study to verify her sleep apnea diagnosis and get reimbursement for her oral appliance.  Previously:    I saw her in a virtual visit on 06/13/2018, at which time she was compliant with her CPAP.   She saw Vaughan Browner, NP in the interim on 06/06/2019 for her neuropathy follow-up, at which time she was using her CPAP.   She was seen for sleep apnea follow-up by Vaughan Browner, NP on 06/21/2019, at which time she was using her CPAP regularly.  She called in the interim in July 2021 reporting that she wanted to look into using inspire.  She reported having difficulty using her CPAP machine.   She saw Dr. Wilburn Cornelia and September 2021 for initial consultation.  She had a follow-up appointment with Dr. Wilburn Cornelia on 07/05/2020, at which time she reported difficulty with CPAP including leakage from the CPAP mask and nasal drainage.  She had a sleep endoscopy on 08/14/2020.   I reviewed her CPAP compliance data from 05/09/2018 through 06/07/2018 which is a  total of 30 days, during which time she used her machine every night with percent used days greater than 4 hours at 100%, indicating superb compliance with an average usage of 7 hours and 10 minutes which is very good, residual AHI at goal at 0.3/h, leak high with a 95th percentile at 50.1 L/min and leak has been noted to be consistently high throughout, pressure at 11 cm with EPR of 3.   I first met her on 05/08/2015 at the request of Dr. Leta Baptist, at which time she reported a prior diagnosis of obstructive sleep apnea.  She was on AutoPap at the time.  She had significant weight loss in the interim.  She was advised to proceed with sleep  study testing.  She had a baseline sleep study on 06/25/2015 which showed an AHI of 17.2/h, O2 nadir of 84%.   She had a subsequent CPAP titration study in the lab on 08/12/2015 which showed great results with a CPAP pressure of 11 cm.   She was seen in the interim by Cecille Rubin, nurse practitioner on 06/09/2016, at which time she was compliant with CPAP.  She was seen by Cecille Rubin, nurse practitioner on 06/04/2017, at which time she was compliant with CPAP and doing well, advised to follow-up in 1 year.    05/08/2015: (She) was previously diagnosed with obstructive sleep apnea many years ago, over 10 years ago. She had a sleep study at [facility] on 03/22/2001 which was interpreted by Dr. Danton Sewer. I reviewed the test results: Overall AHI was 68 per hour, REM sleep was not achieved. Lowest oxygen saturation was 87%. She had a CPAP titration study on 06/02/2001 also with on hospital. CPAP was titrated to 10 cm. He has seen her for recurrent headaches and dizziness. I reviewed your office note from 04/30/2015. She has several tests pending including brain MRI without contrast, brain MRA, neck MRA with and without contrast, echocardiogram. She has been on autoPAP therapy. It started bothering her. She has also lost weight over time. She was 230 pounds in March 2003, current weight is 187 pounds. She has essentially stopped using her AutoPap machine in early February 2015. She was intermittently compliant with it before. I reviewed compliance data from 01/20/2013 through 02/18/2013. She was poorly compliant at the time. She felt that the mask was bothersome and she did not necessarily feel improved in her sleep. She was having very vivid dreams at times. She has gained some weight back. She reports restless leg symptoms which are sometimes quite bothersome to her. Stretching helps. She also changed her bed and mattress and that helped as well. Her Epworth sleepiness score is 14 out of 24 today, her  fatigue score is 35 out of 63. She denies morning headaches or nocturia. She and her husband sleep in separate beds in the same room. She has 2 dogs who don't bother her at night. She has 1 grown son who lives in [place]. Her bedtime is usually between 10:30 and 11 PM. Wakeup time is around 6:30 on most mornings without alarm. She is not sure if she has a family history of obstructive sleep apnea, mother died at 32 with a PE, father died at 49 with a history of MS and after her heart attack. She had a tonsillectomy as a child. She quit smoking in 85, does not drink alcohol and drinks usually 1 cup of coffee in the morning, occasional sodas. She has to take care of her husband who is  82 years older and suffers from dementia and other physical disabilities. She had an echocardiogram earlier this morning, results are pending. She is scheduled for her MRIs. She is also going to start physical therapy in [place].   Her Past Medical History Is Significant For: Past Medical History:  Diagnosis Date   Anxiety    Cataract    Depression    DM type 2 (diabetes mellitus, type 2) (HCC)    Fibromyalgia    H/O dizziness    Hives    from tomatoes   HTN (hypertension)    Hyperlipidemia    Internal hemorrhoids    Kidney disease    Low back pain    OSA (obstructive sleep apnea)    uses c-pap   Osteoarthritis    Osteoporosis    SOB (shortness of breath) on exertion    Tubular adenoma of colon     Her Past Surgical History Is Significant For: Past Surgical History:  Procedure Laterality Date   cervical cryotherapy     cervix   CYSTOCELE REPAIR N/A 11/01/2018   Procedure: CYSTOSCOPY ANTERIOR REPAIR (CYSTOCELE);  Surgeon: Bjorn Loser, MD;  Location: [facility];  Service: Urology;  Laterality: N/A;   DRUG INDUCED ENDOSCOPY N/A 08/14/2020   Procedure: DRUG INDUCED ENDOSCOPY;  Surgeon: Jerrell Belfast, MD;  Location: [facility];  Service: ENT;  Laterality: N/A;   EYE SURGERY      Cataracts/Bil   LASIK Bilateral    TOTAL ABDOMINAL HYSTERECTOMY     TOTAL HIP ARTHROPLASTY  09/2008   right- [facility]/   TOTAL HIP ARTHROPLASTY Left 11/28/2012   Procedure: TOTAL HIP ARTHROPLASTY;  Surgeon: Kerin Salen, MD;  Location: [facility];  Service: Orthopedics;  Laterality: Left;    Her Family History Is Significant For: Family History  Problem Relation Age of Onset   Hypertension Mother    Diabetes Mother    Other Mother        DVT   Heart disease Father    Multiple sclerosis Father    Heart attack Father    Kidney disease Brother 67       ESRD    Her Social History Is Significant For: Social History   Socioeconomic History   Marital status: Married    Spouse name: Sam   Number of children: 1   Years of education: 12    Highest education level: Not on file  Occupational History   Occupation: N/A  Tobacco Use   Smoking status: Former    Packs/day: 2.00    Years: 25.00    Pack years: 50.00    Types: Cigarettes    Quit date: 01/13/1983    Years since quitting: 37.9   Smokeless tobacco: Never  Vaping Use   Vaping Use: Never used  Substance and Sexual Activity   Alcohol use: No    Alcohol/week: 0.0 standard drinks   Drug use: No   Sexual activity: Not Currently    Birth control/protection: Post-menopausal, Surgical  Other Topics Concern   Not on file  Social History Narrative   Was divorced - newly re-married as of 2009, Sam   1 son      Caffeine use- coffee 1 cup daily    Social Determinants of Health   Financial Resource Strain: Low Risk    Difficulty of Paying Living Expenses: Not hard at all  Food Insecurity: No Food Insecurity   Worried About Charity fundraiser in the Last Year: Never true  Ran Out of Food in the Last Year: Never true  Transportation Needs: No Transportation Needs   Lack of Transportation (Medical): No   Lack of Transportation (Non-Medical): No  Physical Activity: Sufficiently Active   Days of Exercise per Week: 5 days    Minutes of Exercise per Session: 30 min  Stress: No Stress Concern Present   Feeling of Stress : Not at all  Social Connections: Socially Integrated   Frequency of Communication with Friends and Family: More than three times a week   Frequency of Social Gatherings with Friends and Family: More than three times a week   Attends Religious Services: More than 4 times per year   Active Member of [employer] or Organizations: Yes   Attends Music therapist: More than 4 times per year   Marital Status: Married    Her Allergies Are:  Allergies  Allergen Reactions   Celecoxib     swell   Diazepam     Does not agree with me   Enalapril     cough   Gabapentin Other (See Comments)    dizziness   Losartan     falls   Lovastatin     REACTION: aches   Lyrica [Pregabalin] Other (See Comments)    dizziness   Naproxen    Pravachol [Pravastatin Sodium]     Nausea    Zetia [Ezetimibe]     Myalgias in LEs  :   Her Current Medications Are:  Outpatient Encounter Medications as of 12/26/2020  Medication Sig   Blood Glucose Monitoring Suppl (ONETOUCH VERIO) w/Device KIT Use to check blood sugar daily. DX E11.9   Cyanocobalamin (B-12) 5000 MCG CAPS Take 1 tablet by mouth once a week.    diphenhydrAMINE (BENADRYL) 25 mg capsule Take 25 mg by mouth daily as needed (allergies).    fluticasone (FLONASE) 50 MCG/ACT nasal spray Place 2 sprays into both nostrils daily.   glucose blood (ONETOUCH VERIO) test strip USE  STRIP TO CHECK GLUCOSE TWICE DAILY PRN   loratadine (CLARITIN) 10 MG tablet Take 1 tablet (10 mg total) by mouth daily.   magnesium oxide (MAG-OX) 400 MG tablet Take 400 mg by mouth daily.   metFORMIN (GLUCOPHAGE) 500 MG tablet TAKE 1 TABLET BY MOUTH TWICE A DAY WITH A MEAL   Omega-3 Fatty Acids (FISH OIL) 1200 MG CAPS Take 1 capsule by mouth in the morning and at bedtime.   OneTouch Delica Lancets 84X MISC USE 1  TO CHECK GLUCOSE IN THE MORNING AND 1 AT BEDTIME   potassium  chloride (KLOR-CON) 8 MEQ tablet Take 1 tablet (8 mEq total) by mouth 2 (two) times daily.   Turmeric Curcumin 500 MG CAPS Take 1 capsule in the morning and 2 at bedtime   venlafaxine XR (EFFEXOR XR) 37.5 MG 24 hr capsule Take 2 capsules (75 mg total) by mouth daily with breakfast.   No facility-administered encounter medications on file as of 12/26/2020.  :  Review of Systems:  Out of a complete 14 point review of systems, all are reviewed and negative with the exception of these symptoms as listed below:   Review of Systems  Neurological:        Pt would like another sleep study (did not think HST accurate).  Has dental device with Dr. Ron Parker. Is not using PAP right now. Brought machine last used 12-11-20.  Wants to have another in lab sleep test.     Objective:  Neurological Exam  Physical Exam  Physical Examination:   Vitals:   12/26/20 1017  BP: 128/70  Pulse: 67    General Examination: The patient is a very pleasant 75 y.o. female in no acute distress. She appears well-developed and well-nourished and well groomed.   HEENT: Normocephalic, atraumatic, pupils are equal, round and reactive to light, extraocular tracking is well-preserved, hearing is grossly intact, face is symmetric with normal facial animation.  Speech is clear without dysarthria or hypophonia.  Neck is supple, no carotid bruits.  Airway examination reveals mild to moderate mouth dryness, mild airway crowding. Tongue protrudes centrally and palate elevates symmetrically.     Chest: Clear to auscultation without wheezing, rhonchi or crackles noted.   Heart: S1+S2+0, regular and normal without murmurs, rubs or gallops noted.   Abdomen: Soft, non-tender and non-distended.   Extremities: There is no pitting edema in the distal lower extremities bilaterally.   Skin: Warm and dry without trophic changes noted.    Musculoskeletal: exam reveals no obvious joint deformities.   Neurologically: Mental status: The  patient is awake, alert and oriented in all 4 spheres. Her immediate and remote memory, attention, language skills and fund of knowledge are appropriate. There is no evidence of aphasia, agnosia, apraxia or anomia. Speech is clear with normal prosody and enunciation. Thought process is linear. Mood is normal and affect is normal. Cranial nerves II - XII are as described above under HEENT exam.  Motor exam: Normal bulk, strength and tone is noted. There is no obvious tremor.  Fine motor skills are grossly intact.  Cerebellar testing: No dysmetria or intention tremor. There is no truncal or gait ataxia. Sensory exam: intact to light touch in the upper and lower extremities. Gait, station and balance: She stands easily.  No difficulty walking, no walking aid.              Assessment and Plan:    In summary, Tiffany Gibbs is a very pleasant 75 year old female with an underlying medical history of anxiety, depression, hypertension, cataracts, osteoarthritis, type 2 diabetes, low back pain, osteoporosis, hyperlipidemia, fibromyalgia, status post right total hip arthroplasty, abdominal hysterectomy, and obesity, who presents for follow-up consultation of her obstructive sleep apnea.  She had been on CPAP therapy of 10 cm for years.  She had difficulty tolerating CPAP.  She was able to lose weight over time.  She had sleep endoscopy and ENT consultation with Dr. Wilburn Cornelia for the possibility of inspire treatment.  Recent home sleep testing in September 2022 indicated only mild residual sleep apnea which would certainly be supported by her weight loss.  Nevertheless, the patient reports that she never sleeps as long as this test indicated.  She had pursued a oral appliance treatment through Dr. Ron Parker and has used it.  Unfortunately, her insurance does not cover the oral appliance.  We mutually agreed to pursue a laboratory attended sleep study at this time.  We will submit for insurance authorization and call her to  schedule this.  She is encouraged to continue with her oral appliance and follow-up with Dr. Ron Parker as scheduled.  We will keep her posted as to her sleep study results in the interim by phone call.  She understands that her insurance may or may not approve her in lab study since she had a recent home sleep test.  I do believe that the in lab study will be more accurate and provide more information which may lead to resolution of all of her coverage denial for her  oral appliance. I answered all her questions today and she was in agreement with this approach.  I spent 40 minutes in total face-to-face time and in reviewing records during pre-charting, more than 50% of which was spent in counseling and coordination of care, reviewing test results, reviewing medications and treatment regimen and/or in discussing or reviewing the diagnosis of OSA, the prognosis and treatment options. Pertinent laboratory and imaging test results that were available during this visit with the patient were reviewed by me and considered in my medical decision making (see chart for details).

## 2020-12-26 NOTE — Patient Instructions (Signed)
It was nice to see you again today.  I am sorry that your oral appliance was not covered by your insurance.  We can certainly try to get approval for an in lab study at this time but unfortunately, there is no guarantee that your insurance will approve another sleep study since you had a home sleep test which appeared to be a valid test from our end of things on 09/25/2020.  We will call you with the result of the authorization and if you are authorized to have an in lab sleep study, we will call you to schedule this.

## 2020-12-30 ENCOUNTER — Encounter: Payer: Self-pay | Admitting: Internal Medicine

## 2020-12-30 ENCOUNTER — Other Ambulatory Visit: Payer: Self-pay

## 2020-12-30 ENCOUNTER — Ambulatory Visit (INDEPENDENT_AMBULATORY_CARE_PROVIDER_SITE_OTHER): Payer: Medicare HMO | Admitting: Internal Medicine

## 2020-12-30 DIAGNOSIS — G4733 Obstructive sleep apnea (adult) (pediatric): Secondary | ICD-10-CM

## 2020-12-30 DIAGNOSIS — E119 Type 2 diabetes mellitus without complications: Secondary | ICD-10-CM | POA: Diagnosis not present

## 2020-12-30 DIAGNOSIS — F3340 Major depressive disorder, recurrent, in remission, unspecified: Secondary | ICD-10-CM

## 2020-12-30 DIAGNOSIS — E538 Deficiency of other specified B group vitamins: Secondary | ICD-10-CM | POA: Diagnosis not present

## 2020-12-30 DIAGNOSIS — N183 Chronic kidney disease, stage 3 unspecified: Secondary | ICD-10-CM | POA: Diagnosis not present

## 2020-12-30 DIAGNOSIS — R69 Illness, unspecified: Secondary | ICD-10-CM | POA: Diagnosis not present

## 2020-12-30 NOTE — Assessment & Plan Note (Signed)
We can use Farxiga low dose for renal protection Hydrate well

## 2020-12-30 NOTE — Assessment & Plan Note (Signed)
Better on Effexor

## 2020-12-30 NOTE — Telephone Encounter (Signed)
Pt has questions about having another sleep study done here in the office rather than having one done at home.

## 2020-12-30 NOTE — Telephone Encounter (Signed)
Pt's orders are in our workqueue

## 2020-12-30 NOTE — Telephone Encounter (Addendum)
Dr Rexene Alberts just saw her on 12/26/20, discussed this, and placed an order for an in-lab sleep study on 12/26/20. Our sleep team has to get approval first from insurance. That can take a few weeks to obtain. The order states the reason for having one in lab vs at home.

## 2020-12-30 NOTE — Progress Notes (Signed)
Subjective:  Patient ID: Tiffany Gibbs, female    DOB: 1945/02/08  Age: 75 y.o. MRN: [id number]  CC: Follow-up (3 month f/u)   HPI Tiffany Gibbs presents for B12 def, hyperglycemia, DM f/u Using a mouth device from Dr Ron Parker  Outpatient Medications Prior to Visit  Medication Sig Dispense Refill   Blood Glucose Monitoring Suppl (ONETOUCH VERIO) w/Device KIT Use to check blood sugar daily. DX E11.9 1 kit 0   Cyanocobalamin (B-12) 5000 MCG CAPS Take 1 tablet by mouth once a week.      diphenhydrAMINE (BENADRYL) 25 mg capsule Take 25 mg by mouth daily as needed (allergies).      fluticasone (FLONASE) 50 MCG/ACT nasal spray Place 2 sprays into both nostrils daily. 16 g 6   glucose blood (ONETOUCH VERIO) test strip USE  STRIP TO CHECK GLUCOSE TWICE DAILY PRN 200 each 1   loratadine (CLARITIN) 10 MG tablet Take 1 tablet (10 mg total) by mouth daily. 30 tablet 11   magnesium oxide (MAG-OX) 400 MG tablet Take 400 mg by mouth daily.     metFORMIN (GLUCOPHAGE) 500 MG tablet TAKE 1 TABLET BY MOUTH TWICE A DAY WITH A MEAL 180 tablet 2   Omega-3 Fatty Acids (FISH OIL) 1200 MG CAPS Take 1 capsule by mouth in the morning and at bedtime.     OneTouch Delica Lancets 97C MISC USE 1  TO CHECK GLUCOSE IN THE MORNING AND 1 AT BEDTIME 200 each 0   potassium chloride (KLOR-CON) 8 MEQ tablet Take 1 tablet (8 mEq total) by mouth 2 (two) times daily. 180 tablet 3   Turmeric Curcumin 500 MG CAPS Take 1 capsule in the morning and 2 at bedtime     venlafaxine XR (EFFEXOR XR) 37.5 MG 24 hr capsule Take 2 capsules (75 mg total) by mouth daily with breakfast. 180 capsule 1   No facility-administered medications prior to visit.    ROS: Review of Systems  Constitutional:  Negative for activity change, appetite change, chills, fatigue and unexpected weight change.  HENT:  Negative for congestion, mouth sores and sinus pressure.   Eyes:  Negative for visual disturbance.  Respiratory:  Negative for  cough and chest tightness.   Gastrointestinal:  Negative for abdominal pain and nausea.  Genitourinary:  Negative for difficulty urinating, frequency and vaginal pain.  Musculoskeletal:  Negative for back pain and gait problem.  Skin:  Negative for pallor and rash.  Neurological:  Negative for dizziness, tremors, weakness, numbness and headaches.  Psychiatric/Behavioral:  Negative for confusion and sleep disturbance.    Objective:  BP 128/72 (BP Location: Left Arm)    Pulse 71    Temp 98.4 F (36.9 C) (Oral)    Ht 5' 4"  (1.626 m)    Wt 147 lb 9.6 oz (67 kg)    SpO2 97%    BMI 25.34 kg/m   BP Readings from Last 3 Encounters:  12/30/20 128/72  12/26/20 128/70  12/10/20 112/60    Wt Readings from Last 3 Encounters:  12/30/20 147 lb 9.6 oz (67 kg)  12/26/20 149 lb (67.6 kg)  12/10/20 148 lb 2 oz (67.2 kg)    Physical Exam Constitutional:      General: She is not in acute distress.    Appearance: She is well-developed.  HENT:     Head: Normocephalic.     Right Ear: External ear normal.     Left Ear: External ear normal.     Nose: Nose normal.  Eyes:     General:        Right eye: No discharge.        Left eye: No discharge.     Conjunctiva/sclera: Conjunctivae normal.     Pupils: Pupils are equal, round, and reactive to light.  Neck:     Thyroid: No thyromegaly.     Vascular: No JVD.     Trachea: No tracheal deviation.  Cardiovascular:     Rate and Rhythm: Normal rate and regular rhythm.     Heart sounds: Normal heart sounds.  Pulmonary:     Effort: No respiratory distress.     Breath sounds: No stridor. No wheezing.  Abdominal:     General: Bowel sounds are normal. There is no distension.     Palpations: Abdomen is soft. There is no mass.     Tenderness: There is no abdominal tenderness. There is no guarding or rebound.  Musculoskeletal:        General: No tenderness.     Cervical back: Normal range of motion and neck supple. No rigidity.  Lymphadenopathy:      Cervical: No cervical adenopathy.  Skin:    Findings: No erythema or rash.  Neurological:     Cranial Nerves: No cranial nerve deficit.     Motor: No abnormal muscle tone.     Coordination: Coordination normal.     Deep Tendon Reflexes: Reflexes normal.  Psychiatric:        Behavior: Behavior normal.        Thought Content: Thought content normal.        Judgment: Judgment normal.    Lab Results  Component Value Date   WBC 5.9 01/06/2020   HGB 13.8 01/06/2020   HCT 41.5 01/06/2020   PLT 215 01/06/2020   GLUCOSE 84 12/26/2020   CHOL 191 12/26/2020   TRIG 70.0 12/26/2020   HDL 74.80 12/26/2020   LDLDIRECT 148.2 02/03/2013   LDLCALC 103 (H) 12/26/2020   ALT 13 12/26/2020   AST 15 12/26/2020   NA 141 12/26/2020   K 4.1 12/26/2020   CL 104 12/26/2020   CREATININE 1.00 12/26/2020   BUN 19 12/26/2020   CO2 28 12/26/2020   TSH 1.37 04/17/2020   INR 1.0 10/28/2018   HGBA1C 5.7 12/26/2020   MICROALBUR 1.6 04/17/2020    No results found.  Assessment & Plan:   Problem List Items Addressed This Visit     B12 deficiency    On B12      Relevant Orders   TSH   CRI (chronic renal insufficiency), stage 3 (moderate) (HCC)    We can use Farxiga low dose for renal protection Hydrate well      Relevant Orders   TSH   Depression    Better on Effexor      Relevant Orders   TSH   DM2 (diabetes mellitus, type 2) (HCC)    Cont on  Metformin Coronary calcium score is 0. D/c Zetia due to cramps and ASA We can use Farxiga low dose for renal protection      Relevant Orders   Comprehensive metabolic panel   Hemoglobin A1c   TSH   OSA (obstructive sleep apnea)    Using a mouth device from Dr Ron Parker - better         No orders of the defined types were placed in this encounter.     Follow-up: No follow-ups on file.  Walker Kehr, MD

## 2020-12-30 NOTE — Assessment & Plan Note (Signed)
On B12 

## 2020-12-30 NOTE — Assessment & Plan Note (Signed)
Cont on  Metformin Coronary calcium score is 0. D/c Zetia due to cramps and ASA We can use Farxiga low dose for renal protection

## 2020-12-30 NOTE — Assessment & Plan Note (Signed)
Using a mouth device from Dr Ron Parker - better

## 2021-01-02 NOTE — Telephone Encounter (Signed)
Pt is asking for a call from RN to know if Dr Rexene Alberts would suggest she holds off on completing the dispute charges for the previous sleep study.  Please call.

## 2021-01-08 NOTE — Telephone Encounter (Signed)
Pt has called back asking if there has been a response from Dr Rexene Alberts yet.  Please call.

## 2021-01-14 ENCOUNTER — Telehealth: Payer: Self-pay | Admitting: Internal Medicine

## 2021-01-14 NOTE — Telephone Encounter (Signed)
Patient calling in  Patient says she has been reading up on the "red yeast rice" supplement to help lower cholesterol & she wants to know if provider approves of her taking the daily supplement  Please call [phone]

## 2021-01-14 NOTE — Telephone Encounter (Signed)
I may be missing something here, what is the question that the patient has?  Can you call her to find out when she is wanting to know?  We discussed during the office visit, that insurance may or may not cover an in lab study as she had a home sleep test.  Did the insurance authorize a PSG?

## 2021-01-14 NOTE — Telephone Encounter (Signed)
I called the pt and LVM asking for a call back when pt is able.

## 2021-01-15 NOTE — Telephone Encounter (Signed)
Patient returned my call.  She is referring to a dispute for the oral appliance.  She states that after the whole entire process was completed and she received her appliance the insurance denied it and she had to pay $2600.  She states Dr. Ron Parker had been on the phone with [employer] and discovered that they denied it because her sleep study showed the AHI of 6.4.  The patient found out that she could dispute the denial for the oral appliance and she wondered if she goes ahead with the dispute what it affects our request to have the in lab sleep study approved.  I told the patient I would see if the sleep study had been approved yet and that we may not be able to tell her if the dispute would affect the sleep study auth but I would ask.  The patient was very appreciative.  She also noted that she has been sleeping better, longer, and feeling more refreshed and her mind is clearer when she wakes up in the morning since she started using the oral device.    Message sent to sleep lab to check on auth status.

## 2021-01-19 NOTE — Telephone Encounter (Signed)
Gerlene can try it.  However, red rice yeast extract is somewhat related to statin drugs.  She will need to discontinue if aches/pains.  Thanks

## 2021-01-20 NOTE — Telephone Encounter (Signed)
Notified pt w/ MD  response. Also pt states she is no longer taking the venlafaxine & her height is 5'2 not 5'4. Inform pt will update.Marland KitchenJohny Chess

## 2021-01-22 ENCOUNTER — Other Ambulatory Visit: Payer: Self-pay | Admitting: Internal Medicine

## 2021-02-04 ENCOUNTER — Telehealth: Payer: Self-pay | Admitting: Neurology

## 2021-02-04 ENCOUNTER — Encounter: Payer: Self-pay | Admitting: Internal Medicine

## 2021-02-04 ENCOUNTER — Other Ambulatory Visit: Payer: Self-pay

## 2021-02-04 ENCOUNTER — Ambulatory Visit (INDEPENDENT_AMBULATORY_CARE_PROVIDER_SITE_OTHER): Payer: Medicare HMO | Admitting: Internal Medicine

## 2021-02-04 DIAGNOSIS — R413 Other amnesia: Secondary | ICD-10-CM | POA: Diagnosis not present

## 2021-02-04 DIAGNOSIS — F419 Anxiety disorder, unspecified: Secondary | ICD-10-CM | POA: Diagnosis not present

## 2021-02-04 DIAGNOSIS — N183 Chronic kidney disease, stage 3 unspecified: Secondary | ICD-10-CM

## 2021-02-04 DIAGNOSIS — F3340 Major depressive disorder, recurrent, in remission, unspecified: Secondary | ICD-10-CM

## 2021-02-04 DIAGNOSIS — E538 Deficiency of other specified B group vitamins: Secondary | ICD-10-CM

## 2021-02-04 DIAGNOSIS — R69 Illness, unspecified: Secondary | ICD-10-CM | POA: Diagnosis not present

## 2021-02-04 MED ORDER — FLUOXETINE HCL 20 MG PO TABS: 20.0000 mg | ORAL_TABLET | Freq: Every day | ORAL | 3 refills | Status: DC

## 2021-02-04 MED ORDER — ALPRAZOLAM 0.5 MG PO TABS
0.5000 mg | ORAL_TABLET | Freq: Two times a day (BID) | ORAL | 1 refills | Status: DC | PRN
Start: 2021-02-04 — End: 2021-07-02

## 2021-02-04 NOTE — Assessment & Plan Note (Addendum)
Worse No psychotic features Chronic depression/OCD Start on Fluoxetine and prn Xanax prn Psychology ref

## 2021-02-04 NOTE — Assessment & Plan Note (Signed)
Worse 1/23 C/o moodiness, agitation, anxiety, angry, arguing w/her husband all the time x weeks or months. It is worse. C/o memory issues and insomnia- sleeping better. No h/o bipolar disorder.

## 2021-02-04 NOTE — Assessment & Plan Note (Signed)
On B12 

## 2021-02-04 NOTE — Telephone Encounter (Signed)
Pt would like a call from the nurse to discuss upcoming sleep test.

## 2021-02-04 NOTE — Progress Notes (Signed)
Subjective:  Patient ID: Tiffany Gibbs, female    DOB: 01/29/1945  Age: 76 y.o. MRN: [id number]  CC: Memory Loss   HPI Tiffany Gibbs presents for moodiness, agitation, anxiety, angry, arguing w/her husband all the time x weeks or months. It is worse. C/o memory issues and insomnia- sleeping better. No h/o bipolar disorder    Outpatient Medications Prior to Visit  Medication Sig Dispense Refill   Blood Glucose Monitoring Suppl (ONETOUCH VERIO) w/Device KIT Use to check blood sugar daily. DX E11.9 1 kit 0   Cyanocobalamin (B-12) 5000 MCG CAPS Take 1 tablet by mouth once a week.      diphenhydrAMINE (BENADRYL) 25 mg capsule Take 25 mg by mouth daily as needed (allergies).      fluticasone (FLONASE) 50 MCG/ACT nasal spray Place 2 sprays into both nostrils daily. 16 g 6   glucose blood (ONETOUCH VERIO) test strip USE STRIP TO CHECK GLUCOSE TWICE DAILY AS NEEDED 200 each 0   loratadine (CLARITIN) 10 MG tablet Take 1 tablet (10 mg total) by mouth daily. 30 tablet 11   magnesium oxide (MAG-OX) 400 MG tablet Take 400 mg by mouth daily.     metFORMIN (GLUCOPHAGE) 500 MG tablet TAKE 1 TABLET BY MOUTH TWICE A DAY WITH A MEAL 180 tablet 2   Omega-3 Fatty Acids (FISH OIL) 1200 MG CAPS Take 1 capsule by mouth in the morning and at bedtime.     OneTouch Delica Lancets 96P MISC USE 1  TO CHECK GLUCOSE IN THE MORNING AND 1 AT BEDTIME 200 each 0   potassium chloride (KLOR-CON) 8 MEQ tablet Take 1 tablet (8 mEq total) by mouth 2 (two) times daily. 180 tablet 3   Turmeric Curcumin 500 MG CAPS Take 1 capsule in the morning and 2 at bedtime     No facility-administered medications prior to visit.    ROS: Review of Systems  Constitutional:  Negative for activity change, appetite change, chills, fatigue and unexpected weight change.  HENT:  Negative for congestion, mouth sores and sinus pressure.   Eyes:  Negative for visual disturbance.  Respiratory:  Negative for cough and chest  tightness.   Gastrointestinal:  Negative for abdominal pain and nausea.  Genitourinary:  Negative for difficulty urinating, frequency and vaginal pain.  Musculoskeletal:  Negative for back pain and gait problem.  Skin:  Negative for pallor and rash.  Neurological:  Negative for dizziness, tremors, weakness, numbness and headaches.  Psychiatric/Behavioral:  Positive for decreased concentration, dysphoric mood and sleep disturbance. Negative for confusion and suicidal ideas. The patient is nervous/anxious.    Objective:  BP 140/82 (BP Location: Left Arm)    Pulse 70    Temp 97.8 F (36.6 C) (Oral)    SpO2 98%   BP Readings from Last 3 Encounters:  02/04/21 140/82  12/30/20 128/72  12/26/20 128/70    Wt Readings from Last 3 Encounters:  12/30/20 147 lb 9.6 oz (67 kg)  12/26/20 149 lb (67.6 kg)  12/10/20 148 lb 2 oz (67.2 kg)    Physical Exam Constitutional:      General: She is not in acute distress.    Appearance: She is well-developed.  HENT:     Head: Normocephalic.     Right Ear: External ear normal.     Left Ear: External ear normal.     Nose: Nose normal.  Eyes:     General:        Right eye: No discharge.  Left eye: No discharge.     Conjunctiva/sclera: Conjunctivae normal.     Pupils: Pupils are equal, round, and reactive to light.  Neck:     Thyroid: No thyromegaly.     Vascular: No JVD.     Trachea: No tracheal deviation.  Cardiovascular:     Rate and Rhythm: Normal rate and regular rhythm.     Heart sounds: Normal heart sounds.  Pulmonary:     Effort: No respiratory distress.     Breath sounds: No stridor. No wheezing.  Abdominal:     General: Bowel sounds are normal. There is no distension.     Palpations: Abdomen is soft. There is no mass.     Tenderness: There is no abdominal tenderness. There is no guarding or rebound.  Musculoskeletal:        General: No tenderness.     Cervical back: Normal range of motion and neck supple. No rigidity.   Lymphadenopathy:     Cervical: No cervical adenopathy.  Skin:    Findings: No erythema or rash.  Neurological:     Cranial Nerves: No cranial nerve deficit.     Motor: No abnormal muscle tone.     Coordination: Coordination normal.     Deep Tendon Reflexes: Reflexes normal.  Psychiatric:        Behavior: Behavior normal.        Thought Content: Thought content normal.        Judgment: Judgment normal.   Emotionally upset  Lab Results  Component Value Date   WBC 5.9 01/06/2020   HGB 13.8 01/06/2020   HCT 41.5 01/06/2020   PLT 215 01/06/2020   GLUCOSE 84 12/26/2020   CHOL 191 12/26/2020   TRIG 70.0 12/26/2020   HDL 74.80 12/26/2020   LDLDIRECT 148.2 02/03/2013   LDLCALC 103 (H) 12/26/2020   ALT 13 12/26/2020   AST 15 12/26/2020   NA 141 12/26/2020   K 4.1 12/26/2020   CL 104 12/26/2020   CREATININE 1.00 12/26/2020   BUN 19 12/26/2020   CO2 28 12/26/2020   TSH 1.37 04/17/2020   INR 1.0 10/28/2018   HGBA1C 5.7 12/26/2020   MICROALBUR 1.6 04/17/2020    No results found.  Assessment & Plan:   Problem List Items Addressed This Visit     Anxiety    Worse 1/23 C/o moodiness, agitation, anxiety, angry, arguing w/her husband all the time x weeks or months. It is worse. C/o memory issues and insomnia- sleeping better. No h/o bipolar disorder.        Relevant Medications   FLUoxetine (PROZAC) 20 MG tablet   ALPRAZolam (XANAX) 0.5 MG tablet   Other Relevant Orders   Ambulatory referral to Psychology   B12 deficiency    On B12      Relevant Orders   Vitamin B12   CRI (chronic renal insufficiency), stage 3 (moderate) (HCC)    Continue with good hydration      Depression    Worse No psychotic features Chronic depression/OCD Start on Fluoxetine and prn Xanax prn Psychology ref      Relevant Medications   FLUoxetine (PROZAC) 20 MG tablet   ALPRAZolam (XANAX) 0.5 MG tablet   Other Relevant Orders   CBC with Differential/Platelet   Comprehensive  metabolic panel   T4, free   TSH   Ambulatory referral to Psychology   Memory difficulty    Treat depression, anxiety       Relevant Orders   CBC with Differential/Platelet  Comprehensive metabolic panel   T4, free   TSH      Meds ordered this encounter  Medications   FLUoxetine (PROZAC) 20 MG tablet    Sig: Take 1-2 tablets (20-40 mg total) by mouth daily.    Dispense:  60 tablet    Refill:  3   ALPRAZolam (XANAX) 0.5 MG tablet    Sig: Take 1-2 tablets (0.5-1 mg total) by mouth 2 (two) times daily as needed for anxiety.    Dispense:  60 tablet    Refill:  1      Follow-up: Return in about 2 weeks (around 02/18/2021) for a follow-up visit.  Walker Kehr, MD

## 2021-02-04 NOTE — Assessment & Plan Note (Signed)
Treat depression, anxiety

## 2021-02-04 NOTE — Telephone Encounter (Signed)
I called pt and she is very nervous about the NPSG that is scheduled 02-16-2021 in the sleep lab.  She has gotten multiple letters from [place] and basically wants a guarantee that [employer] will pay and that of course will need to come from insurance. I told her that we donot authorize for the sleep tests.  Will forward to sleep lab and they may be able to give her more assistance or be more knowledgeable about the insurance [employer]) auth side of it.

## 2021-02-16 ENCOUNTER — Ambulatory Visit (INDEPENDENT_AMBULATORY_CARE_PROVIDER_SITE_OTHER): Payer: Medicare HMO | Admitting: Neurology

## 2021-02-16 DIAGNOSIS — Z789 Other specified health status: Secondary | ICD-10-CM

## 2021-02-16 DIAGNOSIS — R634 Abnormal weight loss: Secondary | ICD-10-CM

## 2021-02-16 DIAGNOSIS — R0683 Snoring: Secondary | ICD-10-CM

## 2021-02-16 DIAGNOSIS — G4733 Obstructive sleep apnea (adult) (pediatric): Secondary | ICD-10-CM

## 2021-02-17 NOTE — Assessment & Plan Note (Signed)
Continue with good hydration

## 2021-02-19 ENCOUNTER — Ambulatory Visit: Admitting: Internal Medicine

## 2021-02-19 DIAGNOSIS — Z9989 Dependence on other enabling machines and devices: Secondary | ICD-10-CM | POA: Diagnosis not present

## 2021-02-19 DIAGNOSIS — R3129 Other microscopic hematuria: Secondary | ICD-10-CM | POA: Diagnosis not present

## 2021-02-19 DIAGNOSIS — I129 Hypertensive chronic kidney disease with stage 1 through stage 4 chronic kidney disease, or unspecified chronic kidney disease: Secondary | ICD-10-CM | POA: Diagnosis not present

## 2021-02-19 DIAGNOSIS — G4733 Obstructive sleep apnea (adult) (pediatric): Secondary | ICD-10-CM | POA: Diagnosis not present

## 2021-02-19 DIAGNOSIS — N182 Chronic kidney disease, stage 2 (mild): Secondary | ICD-10-CM | POA: Diagnosis not present

## 2021-02-19 DIAGNOSIS — E1122 Type 2 diabetes mellitus with diabetic chronic kidney disease: Secondary | ICD-10-CM | POA: Diagnosis not present

## 2021-02-19 DIAGNOSIS — N281 Cyst of kidney, acquired: Secondary | ICD-10-CM | POA: Diagnosis not present

## 2021-02-20 DIAGNOSIS — N182 Chronic kidney disease, stage 2 (mild): Secondary | ICD-10-CM | POA: Diagnosis not present

## 2021-02-20 DIAGNOSIS — N390 Urinary tract infection, site not specified: Secondary | ICD-10-CM | POA: Diagnosis not present

## 2021-02-20 NOTE — Procedures (Signed)
PATIENT'S NAME:  Tiffany Gibbs, Tiffany Gibbs DOB:      1945-12-23      MR#:    [id number]     DATE OF RECORDING: 02/16/2021 REFERRING M.D.:  Lew Dawes, MD Study Performed:   Baseline Polysomnogram HISTORY: 76 year old woman with a history of anxiety, depression, hypertension, cataracts, osteoarthritis, type 2 diabetes, low back pain, osteoporosis, hyperlipidemia, fibromyalgia, status post right total hip arthroplasty, abdominal hysterectomy, and obesity, who presents for re-evaluation of her obstructive sleep apnea. She had a home sleep test on 09/25/20 which indicated mild obstructive sleep apnea with an AHI of 6.4/h, O2 nadir 83%. She started using an oral appliance. She presents for an in lab sleep study. She did not use her oral appliance for this test. The patient's weight 149 pounds with a height of 64 (inches), resulting in a BMI of 25.6 kg/m2.   CURRENT MEDICATIONS:B-12, Benadryl, Flonase, Claritin, Mag-Ox, Glucophage, Fish Oil, OneTouch Delica Lancets, Klor-Con, Turmeric Curcumin, Effexor XR   PROCEDURE:  This is a multichannel digital polysomnogram utilizing the Somnostar 11.2 system.  Electrodes and sensors were applied and monitored per AASM Specifications.   EEG, EOG, Chin and Limb EMG, were sampled at 200 Hz.  ECG, Snore and Nasal Pressure, Thermal Airflow, Respiratory Effort, CPAP Flow and Pressure, Oximetry was sampled at 50 Hz. Digital video and audio were recorded.      BASELINE STUDY  Lights Out was at 21:47 and Lights On at 05:00.  Total recording time (TRT) was 433 minutes, with a total sleep time (TST) of 386.5 minutes.  The patient's sleep latency was 24.5 minutes. REM latency was 116 minutes.  The sleep efficiency was 89.3 %.     SLEEP ARCHITECTURE: WASO (Wake after sleep onset) was 22 minutes. There were 12 minutes in Stage N1, 226.5 minutes Stage N2, 71.5 minutes Stage N3 and 76.5 minutes in Stage REM.  The percentage of Stage N1 was 3.1%, Stage N2 was 58.6%, which is mildly  increased, Stage N3 was 18.5%, which is normal, and Stage R (REM sleep) was 19.8%, which is normal. The arousals were noted as: 15 were spontaneous, 11 were associated with PLMs, 4 were associated with respiratory events.  RESPIRATORY ANALYSIS:  There were a total of 5 respiratory events:  2 obstructive apneas, 0 central apneas and 0 mixed apneas with a total of 2 apneas and an apnea index (AI) of .3 /hour. There were 3 hypopneas with a hypopnea index of .5 /hour. The patient also had 0 respiratory event related arousals (RERAs).      The total APNEA/HYPOPNEA INDEX (AHI) was .8/hour and the total RESPIRATORY DISTURBANCE INDEX was  .8 /hour.  4 events occurred in REM sleep and 2 events in NREM. The REM AHI was  3.1 /hour, versus a non-REM AHI of .2. The patient spent 57.5 minutes of total sleep time in the supine position and 329 minutes in non-supine.. The supine AHI was 4.2 versus a non-supine AHI of 0.2.  OXYGEN SATURATION & C02:  The Wake baseline 02 saturation was 96%, with the lowest being 83%. Time spent below 89% saturation equaled 12 minutes.  PERIODIC LIMB MOVEMENTS: The patient had a total of 371 Periodic Limb Movements.  The Periodic Limb Movement (PLM) index was 57.6 and the PLM Arousal index was 1.7/hour.  Audio and video analysis did not show any abnormal or unusual movements, behaviors, phonations or vocalizations. The patient took no bathroom breaks. Intermittent mild to moderate snoring was noted. The EKG was in keeping with  normal sinus rhythm (NSR).  Post-study, the patient indicated that sleep was the same as usual.   IMPRESSION:   Primary Snoring  RECOMMENDATIONS:  This study does not demonstrate any significant obstructive or central sleep disordered breathing with the exception of intermittent snoring, in the mild to moderate range.  Severe PLMs (periodic limb movements of sleep) were noted during this study with no significant arousals; clinical correlation is  recommended. Medication effect from the antidepressant medication should be considered.  The patient should be cautioned not to drive, work at heights, or operate dangerous or heavy equipment when tired or sleepy. Review and reiteration of good sleep hygiene measures should be pursued with any patient. The patient will be notified of the test results.  I certify that I have reviewed the entire raw data recording prior to the issuance of this report in accordance with the Standards of Accreditation of the American Academy of Sleep Medicine (AASM)   Star Age, MD, PhD Diplomat, American Board of Neurology and Sleep Medicine (Neurology and Sleep Medicine)

## 2021-02-24 ENCOUNTER — Other Ambulatory Visit: Payer: Self-pay

## 2021-02-24 ENCOUNTER — Ambulatory Visit (INDEPENDENT_AMBULATORY_CARE_PROVIDER_SITE_OTHER): Payer: Medicare HMO | Admitting: Internal Medicine

## 2021-02-24 ENCOUNTER — Encounter: Payer: Self-pay | Admitting: Internal Medicine

## 2021-02-24 ENCOUNTER — Telehealth: Payer: Self-pay

## 2021-02-24 DIAGNOSIS — R0683 Snoring: Secondary | ICD-10-CM | POA: Diagnosis not present

## 2021-02-24 DIAGNOSIS — G2581 Restless legs syndrome: Secondary | ICD-10-CM

## 2021-02-24 DIAGNOSIS — G4709 Other insomnia: Secondary | ICD-10-CM

## 2021-02-24 DIAGNOSIS — R413 Other amnesia: Secondary | ICD-10-CM

## 2021-02-24 DIAGNOSIS — F419 Anxiety disorder, unspecified: Secondary | ICD-10-CM

## 2021-02-24 DIAGNOSIS — N183 Chronic kidney disease, stage 3 unspecified: Secondary | ICD-10-CM | POA: Diagnosis not present

## 2021-02-24 DIAGNOSIS — R69 Illness, unspecified: Secondary | ICD-10-CM | POA: Diagnosis not present

## 2021-02-24 DIAGNOSIS — E538 Deficiency of other specified B group vitamins: Secondary | ICD-10-CM | POA: Diagnosis not present

## 2021-02-24 DIAGNOSIS — G4700 Insomnia, unspecified: Secondary | ICD-10-CM | POA: Insufficient documentation

## 2021-02-24 NOTE — Telephone Encounter (Signed)
I called patient to discuss her sleep study results. No answer, left a message asking her to call me back.

## 2021-02-24 NOTE — Progress Notes (Signed)
Subjective:  Patient ID: Tiffany Gibbs, female    DOB: 12-05-1945  Age: 76 y.o. MRN: [id number]  CC: No chief complaint on file.   HPI Tiffany Gibbs presents for depression, anxiety - better. Insomnia is better...  C/o snoring - no OSA on sleep test. Pt had severe PLMs (periodic limb movements of sleep) 02/16/21. C/o insomnia  Outpatient Medications Prior to Visit  Medication Sig Dispense Refill   ALPRAZolam (XANAX) 0.5 MG tablet Take 1-2 tablets (0.5-1 mg total) by mouth 2 (two) times daily as needed for anxiety. 60 tablet 1   Blood Glucose Monitoring Suppl (ONETOUCH VERIO) w/Device KIT Use to check blood sugar daily. DX E11.9 1 kit 0   Cyanocobalamin (B-12) 5000 MCG CAPS Take 1 tablet by mouth once a week.      diphenhydrAMINE (BENADRYL) 25 mg capsule Take 25 mg by mouth daily as needed (allergies).      FLUoxetine (PROZAC) 20 MG tablet Take 1-2 tablets (20-40 mg total) by mouth daily. 60 tablet 3   fluticasone (FLONASE) 50 MCG/ACT nasal spray Place 2 sprays into both nostrils daily. 16 g 6   glucose blood (ONETOUCH VERIO) test strip USE STRIP TO CHECK GLUCOSE TWICE DAILY AS NEEDED 200 each 0   loratadine (CLARITIN) 10 MG tablet Take 1 tablet (10 mg total) by mouth daily. 30 tablet 11   magnesium oxide (MAG-OX) 400 MG tablet Take 400 mg by mouth daily.     metFORMIN (GLUCOPHAGE) 500 MG tablet TAKE 1 TABLET BY MOUTH TWICE A DAY WITH A MEAL 180 tablet 2   Omega-3 Fatty Acids (FISH OIL) 1200 MG CAPS Take 1 capsule by mouth in the morning and at bedtime.     OneTouch Delica Lancets 19Q MISC USE 1  TO CHECK GLUCOSE IN THE MORNING AND 1 AT BEDTIME 200 each 0   potassium chloride (KLOR-CON) 8 MEQ tablet Take 1 tablet (8 mEq total) by mouth 2 (two) times daily. 180 tablet 3   Turmeric Curcumin 500 MG CAPS Take 1 capsule in the morning and 2 at bedtime     No facility-administered medications prior to visit.    ROS: Review of Systems  Constitutional:  Negative for activity  change, appetite change, chills, fatigue and unexpected weight change.  HENT:  Negative for congestion, mouth sores and sinus pressure.   Eyes:  Negative for visual disturbance.  Respiratory:  Negative for cough and chest tightness.   Gastrointestinal:  Negative for abdominal pain and nausea.  Genitourinary:  Negative for difficulty urinating, frequency and vaginal pain.  Musculoskeletal:  Negative for back pain and gait problem.  Skin:  Negative for pallor and rash.  Neurological:  Negative for dizziness, tremors, weakness, numbness and headaches.  Psychiatric/Behavioral:  Positive for sleep disturbance. Negative for confusion and suicidal ideas. The patient is nervous/anxious.    Objective:  BP 130/80 (BP Location: Left Arm, Patient Position: Sitting, Cuff Size: Large)    Pulse 65    Temp 98.7 F (37.1 C) (Oral)    Ht _0  (1.626 m)    Wt 148 lb (67.1 kg)    SpO2 95%    BMI 25.40 kg/m   BP Readings from Last 3 Encounters:  02/24/21 130/80  02/04/21 140/82  12/30/20 128/72    Wt Readings from Last 3 Encounters:  02/24/21 148 lb (67.1 kg)  12/30/20 147 lb 9.6 oz (67 kg)  12/26/20 149 lb (67.6 kg)    Physical Exam Constitutional:  General: She is not in acute distress.    Appearance: She is well-developed.  HENT:     Head: Normocephalic.     Right Ear: External ear normal.     Left Ear: External ear normal.     Nose: Nose normal.  Eyes:     General:        Right eye: No discharge.        Left eye: No discharge.     Conjunctiva/sclera: Conjunctivae normal.     Pupils: Pupils are equal, round, and reactive to light.  Neck:     Thyroid: No thyromegaly.     Vascular: No JVD.     Trachea: No tracheal deviation.  Cardiovascular:     Rate and Rhythm: Normal rate and regular rhythm.     Heart sounds: Normal heart sounds.  Pulmonary:     Effort: No respiratory distress.     Breath sounds: No stridor. No wheezing.  Abdominal:     General: Bowel sounds are normal. There  is no distension.     Palpations: Abdomen is soft. There is no mass.     Tenderness: There is no abdominal tenderness. There is no guarding or rebound.  Musculoskeletal:        General: No tenderness.     Cervical back: Normal range of motion and neck supple. No rigidity.  Lymphadenopathy:     Cervical: No cervical adenopathy.  Skin:    Findings: No erythema or rash.  Neurological:     Mental Status: She is oriented to person, place, and time.     Cranial Nerves: No cranial nerve deficit.     Motor: No abnormal muscle tone.     Coordination: Coordination normal.     Deep Tendon Reflexes: Reflexes normal.  Psychiatric:        Behavior: Behavior normal.        Thought Content: Thought content normal.        Judgment: Judgment normal.    Lab Results  Component Value Date   WBC 5.9 01/06/2020   HGB 13.8 01/06/2020   HCT 41.5 01/06/2020   PLT 215 01/06/2020   GLUCOSE 84 12/26/2020   CHOL 191 12/26/2020   TRIG 70.0 12/26/2020   HDL 74.80 12/26/2020   LDLDIRECT 148.2 02/03/2013   LDLCALC 103 (H) 12/26/2020   ALT 13 12/26/2020   AST 15 12/26/2020   NA 141 12/26/2020   K 4.1 12/26/2020   CL 104 12/26/2020   CREATININE 1.00 12/26/2020   BUN 19 12/26/2020   CO2 28 12/26/2020   TSH 1.37 04/17/2020   INR 1.0 10/28/2018   HGBA1C 5.7 12/26/2020   MICROALBUR 1.6 04/17/2020    No results found.  Assessment & Plan:   Problem List Items Addressed This Visit     Anxiety    Better on Fluoxetine      B12 deficiency    Cont w/Vit B12      CRI (chronic renal insufficiency), stage 3 (moderate) (HCC)    F/u w/Dr Coladonato      Insomnia disorder   Memory difficulty    Better w/better sleep      RLS (restless legs syndrome)    Sleep test - no OSA on sleep test. Pt had severe PLMs (periodic limb movements of sleep) 02/16/21 - Dr Artis Flock per Dr Ron Parker Xanax - up to 34m/hs  Potential benefits of a long term benzodiazepines  use as well as potential risks  and  complications were explained  to the patient and were aknowledged.      Snoring    Snoring - no OSA on sleep test. Pt had severe PLMs (periodic limb movements of sleep) 02/16/21 - Dr Artis Flock per Dr Ron Parker         No orders of the defined types were placed in this encounter.     Follow-up: Return in about 6 weeks (around 04/07/2021) for a follow-up visit.  Walker Kehr, MD

## 2021-02-24 NOTE — Assessment & Plan Note (Signed)
Better on Fluoxetine

## 2021-02-24 NOTE — Assessment & Plan Note (Signed)
Cont w/Vit B12 

## 2021-02-24 NOTE — Assessment & Plan Note (Signed)
F/u w/Dr Coladonato  

## 2021-02-24 NOTE — Telephone Encounter (Signed)
Patient returned my call. I discussed her sleep study results with her. Patient will follow up with Dr. Alain Marion. Patient asked that a copy of her results be sent to Dr. Ron Parker. Pt verbalized understanding of results. Pt had no questions at this time but was encouraged to call back if questions arise.

## 2021-02-24 NOTE — Telephone Encounter (Signed)
-----   Message from Star Age, MD sent at 02/20/2021  7:06 PM EST ----- Patient had a home sleep test on 09/25/20 which indicated mild obstructive sleep apnea with an AHI of 6.4/h, O2 nadir 83%. She started using an oral appliance. She presents for an in lab sleep study to reassess her OSA. She did not use her oral appliance for this test. Please call and notify the patient that the recent sleep study did not show any significant obstructive sleep apnea  with the exception of intermittent snoring, in the mild to moderate range. She slept fairly well, achieved all stages of sleep. She had some leg movements which did not result in any significant sleep disruption. At this juncture, she can FU with her PCP and other providers as scheduled and see her primary neurologist, Dr. Leta Baptist, as needed.  Thanks,  Star Age, MD, PhD [facility])

## 2021-02-24 NOTE — Assessment & Plan Note (Signed)
Better w/better sleep

## 2021-02-24 NOTE — Assessment & Plan Note (Signed)
Snoring - no OSA on sleep test. Pt had severe PLMs (periodic limb movements of sleep) 02/16/21 - Dr Artis Flock per Dr Ron Parker

## 2021-02-24 NOTE — Assessment & Plan Note (Signed)
Sleep test - no OSA on sleep test. Pt had severe PLMs (periodic limb movements of sleep) 02/16/21 - Dr Artis Flock per Dr Ron Parker Xanax - up to 1mg /hs  Potential benefits of a long term benzodiazepines  use as well as potential risks  and complications were explained to the patient and were aknowledged.

## 2021-02-26 ENCOUNTER — Telehealth: Payer: Self-pay

## 2021-02-26 NOTE — Telephone Encounter (Signed)
Pt is wondering if she should take more than one OTC extra strength B12 dissolvable tab weekly. Pt is currently taking one a week.  Please advise Pt CB [phone]. Pt stated you can leave information on VM if there is no answer.

## 2021-02-27 NOTE — Telephone Encounter (Signed)
No.  We can check her vitamin B12 level next time I see her-it was included and ordered lab work in January.  Thanks

## 2021-03-03 DIAGNOSIS — E1142 Type 2 diabetes mellitus with diabetic polyneuropathy: Secondary | ICD-10-CM | POA: Diagnosis not present

## 2021-03-03 DIAGNOSIS — Z7984 Long term (current) use of oral hypoglycemic drugs: Secondary | ICD-10-CM | POA: Diagnosis not present

## 2021-03-03 DIAGNOSIS — L602 Onychogryphosis: Secondary | ICD-10-CM | POA: Diagnosis not present

## 2021-03-03 DIAGNOSIS — L84 Corns and callosities: Secondary | ICD-10-CM | POA: Insufficient documentation

## 2021-03-07 ENCOUNTER — Telehealth: Payer: Self-pay | Admitting: Internal Medicine

## 2021-03-07 NOTE — Telephone Encounter (Signed)
Pt states ALPRAZolam (XANAX) 0.5 MG tablet is giving her headaches and leg cramps the morning after, pt states she only gets 3 hrs of rest  Pt requesting an alternative medication

## 2021-03-11 ENCOUNTER — Other Ambulatory Visit: Payer: Self-pay

## 2021-03-11 DIAGNOSIS — E119 Type 2 diabetes mellitus without complications: Secondary | ICD-10-CM

## 2021-03-11 MED ORDER — TEMAZEPAM 15 MG PO CAPS: 15.0000 mg | ORAL_CAPSULE | Freq: Every evening | ORAL | 1 refills | Status: DC | PRN

## 2021-03-11 MED ORDER — ONETOUCH DELICA LANCETS 33G MISC: 4 refills | Status: DC

## 2021-03-11 NOTE — Telephone Encounter (Signed)
Spoke with pt and was able to advise her of Dr. Judeen Hammans instructions. Pt states she understood and will do so.

## 2021-03-11 NOTE — Telephone Encounter (Signed)
Okay temazepam at night.  Thanks

## 2021-03-19 ENCOUNTER — Ambulatory Visit

## 2021-04-01 ENCOUNTER — Other Ambulatory Visit: Payer: Self-pay

## 2021-04-01 ENCOUNTER — Ambulatory Visit (INDEPENDENT_AMBULATORY_CARE_PROVIDER_SITE_OTHER): Payer: Medicare HMO

## 2021-04-01 VITALS — BP 118/70 | HR 64 | Temp 97.6°F | Resp 16 | Ht 62.0 in | Wt 145.2 lb

## 2021-04-01 DIAGNOSIS — Z0000 Encounter for general adult medical examination without abnormal findings: Secondary | ICD-10-CM | POA: Diagnosis not present

## 2021-04-01 NOTE — Progress Notes (Addendum)
? ?Subjective:  ? Tiffany Gibbs is a 76 y.o. female who presents for Medicare Annual (Subsequent) preventive examination. ? ?Review of Systems    ? ?Cardiac Risk Factors include: advanced age (>82mn, >>64women);diabetes mellitus;family history of premature cardiovascular disease;hypertension;dyslipidemia ? ?   ?Objective:  ?  ?Today's Vitals  ? 04/01/21 1244  ?BP: 118/70  ?Pulse: 64  ?Resp: 16  ?Temp: 97.6 ?F (36.4 ?C)  ?SpO2: 98%  ?Weight: 145 lb 3.2 oz (65.9 kg)  ?Height: 5' 2"  (1.575 m)  ?PainSc: 0-No pain  ? ?Body mass index is 26.56 kg/m?. ? ?Advanced Directives 04/01/2021 08/14/2020 08/08/2020 03/18/2020 01/06/2020 11/01/2018 10/28/2018  ?Does Patient Have a Medical Advance Directive? Yes Yes No;Yes Yes Yes Yes Yes  ?Type of Advance Directive Living will;Healthcare Power of Attorney Living will;Healthcare Power of [place] Living [place];Healthcare [place] of [place]  ?Does patient want to make changes to medical advance directive? No - Patient declined No - Patient declined No - Patient declined No - Patient declined - No - Patient declined No - Patient declined  ?Copy of [place] Chart? No - copy requested Yes - validated most recent copy scanned in chart (See row information) - No - copy requested - No - copy requested No - copy requested  ?Would patient like information on creating a medical advance directive? - No - Patient declined No - Patient declined - - - -  ?Pre-existing out of facility DNR order (yellow form or pink MOST form) - - - - - - -  ? ? ?Current Medications (verified) ?Outpatient Encounter Medications as of 04/01/2021  ?Medication Sig  ? Blood Glucose Monitoring Suppl (ONETOUCH VERIO) w/Device KIT Use to check blood sugar daily. DX E11.9  ? Cyanocobalamin (B-12) 5000 MCG CAPS Take 1 tablet by mouth once a week.   ? diphenhydrAMINE (BENADRYL) 25  mg capsule Take 25 mg by mouth daily as needed (allergies).   ? FLUoxetine (PROZAC) 20 MG tablet Take 1-2 tablets (20-40 mg total) by mouth daily.  ? fluticasone (FLONASE) 50 MCG/ACT nasal spray Place 2 sprays into both nostrils daily.  ? glucose blood (ONETOUCH VERIO) test strip USE STRIP TO CHECK GLUCOSE TWICE DAILY AS NEEDED  ? loratadine (CLARITIN) 10 MG tablet Take 1 tablet (10 mg total) by mouth daily.  ? magnesium oxide (MAG-OX) 400 MG tablet Take 400 mg by mouth daily.  ? metFORMIN (GLUCOPHAGE) 500 MG tablet TAKE 1 TABLET BY MOUTH TWICE A DAY WITH A MEAL  ? Omega-3 Fatty Acids (FISH OIL) 1200 MG CAPS Take 1 capsule by mouth in the morning and at bedtime.  ? OneTouch Delica Lancets 382UMISC USE 1  TO CHECK GLUCOSE IN THE MORNING AND 1 AT BEDTIME  ? potassium chloride (KLOR-CON) 8 MEQ tablet Take 1 tablet (8 mEq total) by mouth 2 (two) times daily.  ? Turmeric Curcumin 500 MG CAPS Take 1 capsule in the morning and 2 at bedtime  ? ALPRAZolam (XANAX) 0.5 MG tablet Take 1-2 tablets (0.5-1 mg total) by mouth 2 (two) times daily as needed for anxiety. (Patient not taking: Reported on 04/01/2021)  ? temazepam (RESTORIL) 15 MG capsule Take 1 capsule (15 mg total) by mouth at bedtime as needed for sleep. (Patient not taking: Reported on 04/01/2021)  ? ?No facility-administered encounter medications on file as of 04/01/2021.  ? ? ?Allergies (verified) ?Celecoxib, Diazepam, Enalapril, Gabapentin, Losartan, Lovastatin,  Lyrica [pregabalin], Naproxen, Pravachol [pravastatin sodium], and Zetia [ezetimibe]  ? ?History: ?Past Medical History:  ?Diagnosis Date  ? Anxiety   ? Cataract   ? Depression   ? DM type 2 (diabetes mellitus, type 2) ([place])   ? Fibromyalgia   ? H/O dizziness   ? Hives   ? from tomatoes  ? HTN (hypertension)   ? Hyperlipidemia   ? Internal hemorrhoids   ? Kidney disease   ? Low back pain   ? OSA (obstructive sleep apnea)   ? uses c-pap  ? Osteoarthritis   ? Osteoporosis   ? SOB (shortness of breath) on  exertion   ? Tubular adenoma of colon   ? ?Past Surgical History:  ?Procedure Laterality Date  ? cervical cryotherapy    ? cervix  ? CYSTOCELE REPAIR N/A 11/01/2018  ? Procedure: CYSTOSCOPY ANTERIOR REPAIR (CYSTOCELE);  Surgeon: Bjorn Loser, MD;  Location: [facility];  Service: Urology;  Laterality: N/A;  ? DRUG INDUCED ENDOSCOPY N/A 08/14/2020  ? Procedure: DRUG INDUCED ENDOSCOPY;  Surgeon: Jerrell Belfast, MD;  Location: [facility];  Service: ENT;  Laterality: N/A;  ? EYE SURGERY    ? Cataracts/Bil  ? LASIK Bilateral   ? TOTAL ABDOMINAL HYSTERECTOMY    ? TOTAL HIP ARTHROPLASTY  09/2008  ? right- [place]/  ? TOTAL HIP ARTHROPLASTY Left 11/28/2012  ? Procedure: TOTAL HIP ARTHROPLASTY;  Surgeon: Kerin Salen, MD;  Location: [place];  Service: Orthopedics;  Laterality: Left;  ? ?Family History  ?Problem Relation Age of Onset  ? Hypertension Mother   ? Diabetes Mother   ? Other Mother   ?     DVT  ? Heart disease Father   ? Multiple sclerosis Father   ? Heart attack Father   ? Kidney disease Brother 75  ?     ESRD  ? ?Social History  ? ?Socioeconomic History  ? Marital status: Married  ?  Spouse name: Sam  ? Number of children: 1  ? Years of education: 58   ? Highest education level: Not on file  ?Occupational History  ? Occupation: N/A  ?Tobacco Use  ? Smoking status: Former  ?  Packs/day: 2.00  ?  Years: 25.00  ?  Pack years: 50.00  ?  Types: Cigarettes  ?  Quit date: 01/13/1983  ?  Years since quitting: 38.2  ? Smokeless tobacco: Never  ?Vaping Use  ? Vaping Use: Never used  ?Substance and Sexual Activity  ? Alcohol use: No  ?  Alcohol/week: 0.0 standard drinks  ? Drug use: No  ? Sexual activity: Not Currently  ?  Birth control/protection: Post-menopausal, Surgical  ?Other Topics Concern  ? Not on file  ?Social History Narrative  ? Was divorced - newly re-married as of 2009, Sam  ? 1 son  ?   ? Caffeine use- coffee 1 cup daily   ? ?Social Determinants of Health  ? ?Financial Resource Strain: Low Risk   ?  Difficulty of Paying Living Expenses: Not hard at all  ?Food Insecurity: No Food Insecurity  ? Worried About Charity fundraiser in the Last Year: Never true  ? Ran Out of Food in the Last Year: Never true  ?Transportation Needs: No Transportation Needs  ? Lack of Transportation (Medical): No  ? Lack of Transportation (Non-Medical): No  ?Physical Activity: Sufficiently Active  ? Days of Exercise per Week: 5 days  ? Minutes of Exercise per Session: 30 min  ?Stress:  No Stress Concern Present  ? Feeling of Stress : Not at all  ?Social Connections: Socially Integrated  ? Frequency of Communication with Friends and Family: More than three times a week  ? Frequency of Social Gatherings with Friends and Family: More than three times a week  ? Attends Religious Services: More than 4 times per year  ? Active Member of Clubs or Organizations: Yes  ? Attends Archivist Meetings: More than 4 times per year  ? Marital Status: Married  ? ? ?Tobacco Counseling ?Counseling given: Not Answered ? ? ?Clinical Intake: ? ?Pre-visit preparation completed: Yes ? ?Pain : No/denies pain ?Pain Score: 0-No pain ? ?  ? ?BMI - recorded: 26.56 ?Nutritional Status: BMI 25 -29 Overweight ?Nutritional Risks: None ?Diabetes: Yes ?CBG done?: No ?Did pt. bring in CBG monitor from home?: No ? ?How often do you need to have someone help you when you read instructions, pamphlets, or other written materials from your doctor or pharmacy?: 1 - Never ?What is the last grade level you completed in school?: GED ? ?Diabetic? yes ? ?Interpreter Needed?: No ? ?Information entered by :: Lisette Abu, LPN ? ? ?Activities of Daily Living ?In your present state of health, do you have any difficulty performing the following activities: 04/01/2021 08/14/2020  ?Hearing? N N  ?Vision? N N  ?Difficulty concentrating or making decisions? N N  ?Walking or climbing stairs? N N  ?Dressing or bathing? N N  ?Doing errands, shopping? N -  ?Preparing Food and eating ?  N -  ?Using the Toilet? N -  ?In the past six months, have you accidently leaked urine? N -  ?Do you have problems with loss of bowel control? N -  ?Managing your Medications? N -  ?Managing your Finances?

## 2021-04-01 NOTE — Patient Instructions (Signed)
Tiffany Gibbs , ?Thank you for taking time to come for your Medicare Wellness Visit. I appreciate your ongoing commitment to your health goals. Please review the following plan we discussed and let me know if I can assist you in the future.  ? ?Screening recommendations/referrals: ?Colonoscopy: 09/09/2015; due every 5 years (overdue) ?Mammogram: 11/28/2020; due every 1-2 years ?Bone Density: 09/25/2015; due every 2 years (dx: osteopenia) ?Recommended yearly ophthalmology/optometry visit for glaucoma screening and checkup ?Recommended yearly dental visit for hygiene and checkup ? ?Vaccinations: ?Influenza vaccine: 11/20/2019; due every Fall Season (overdue) ?Pneumococcal vaccine: 11/03/2010, 08/31/2019, 05/30/2020 ?Tdap vaccine: 03/07/2011; due every 10 years (overdue);  ?Shingles vaccine: 06/23/2016, 10/09/2016, 12/19/2019 (per [facility]) ?Covid-19: 02/03/2019, 03/05/2019, 09/07/2019, 03/06/2020, 11/01/2020 ? ?Advanced directives: Please bring a copy of your health care power of attorney and living will to the office at your convenience. ? ?Conditions/risks identified: Yes. Reviewed health maintenance screenings with patient today and relevant education, vaccines, and/or referrals were provided. Continue doing brain stimulating activities (puzzles, reading, adult coloring books, staying active) to keep memory sharp. Continue to eat heart healthy diet (full of fruits, vegetables, whole grains, lean protein, water--limit salt, fat, and sugar intake) and increase physical activity as tolerated. ? ?Next appointment: Please schedule your next Medicare Wellness Visit with your Nurse Health Advisor in 1 year or 366 days by calling [phone]. ? ? ?Preventive Care 67 Years and Older, Female ?Preventive care refers to lifestyle choices and visits with your health care provider that can promote health and wellness. ?What does preventive care include? ?A yearly physical exam. This is also called an annual well check. ?Dental exams once or twice  a year. ?Routine eye exams. Ask your health care provider how often you should have your eyes checked. ?Personal lifestyle choices, including: ?Daily care of your teeth and gums. ?Regular physical activity. ?Eating a healthy diet. ?Avoiding tobacco and drug use. ?Limiting alcohol use. ?Practicing safe sex. ?Taking low-dose aspirin every day. ?Taking vitamin and mineral supplements as recommended by your health care provider. ?What happens during an annual well check? ?The services and screenings done by your health care provider during your annual well check will depend on your age, overall health, lifestyle risk factors, and family history of disease. ?Counseling  ?Your health care provider may ask you questions about your: ?Alcohol use. ?Tobacco use. ?Drug use. ?Emotional well-being. ?Home and relationship well-being. ?Sexual activity. ?Eating habits. ?History of falls. ?Memory and ability to understand (cognition). ?Work and work Statistician. ?Reproductive health. ?Screening  ?You may have the following tests or measurements: ?Height, weight, and BMI. ?Blood pressure. ?Lipid and cholesterol levels. These may be checked every 5 years, or more frequently if you are over 21 years old. ?Skin check. ?Lung cancer screening. You may have this screening every year starting at age 54 if you have a 30-pack-year history of smoking and currently smoke or have quit within the past 15 years. ?Fecal occult blood test (FOBT) of the stool. You may have this test every year starting at age 35. ?Flexible sigmoidoscopy or colonoscopy. You may have a sigmoidoscopy every 5 years or a colonoscopy every 10 years starting at age 71. ?Hepatitis C blood test. ?Hepatitis B blood test. ?Sexually transmitted disease (STD) testing. ?Diabetes screening. This is done by checking your blood sugar (glucose) after you have not eaten for a while (fasting). You may have this done every 1-3 years. ?Bone density scan. This is done to screen for  osteoporosis. You may have this done starting at age 6. ?  Mammogram. This may be done every 1-2 years. Talk to your health care provider about how often you should have regular mammograms. ?Talk with your health care provider about your test results, treatment options, and if necessary, the need for more tests. ?Vaccines  ?Your health care provider may recommend certain vaccines, such as: ?Influenza vaccine. This is recommended every year. ?Tetanus, diphtheria, and acellular pertussis (Tdap, Td) vaccine. You may need a Td booster every 10 years. ?Zoster vaccine. You may need this after age 54. ?Pneumococcal 13-valent conjugate (PCV13) vaccine. One dose is recommended after age 15. ?Pneumococcal polysaccharide (PPSV23) vaccine. One dose is recommended after age 61. ?Talk to your health care provider about which screenings and vaccines you need and how often you need them. ?This information is not intended to replace advice given to you by your health care provider. Make sure you discuss any questions you have with your health care provider. ?Document Released: 01/25/2015 Document Revised: 09/18/2015 Document Reviewed: 10/30/2014 ?Elsevier Interactive Patient Education ? 2017 [place]. ? ?Fall Prevention in the Home ?Falls can cause injuries. They can happen to people of all ages. There are many things you can do to make your home safe and to help prevent falls. ?What can I do on the outside of my home? ?Regularly fix the edges of walkways and driveways and fix any cracks. ?Remove anything that might make you trip as you walk through a door, such as a raised step or threshold. ?Trim any bushes or trees on the path to your home. ?Use bright outdoor lighting. ?Clear any walking paths of anything that might make someone trip, such as rocks or tools. ?Regularly check to see if handrails are loose or broken. Make sure that both sides of any steps have handrails. ?Any raised decks and porches should have guardrails on  the edges. ?Have any leaves, snow, or ice cleared regularly. ?Use sand or salt on walking paths during winter. ?Clean up any spills in your garage right away. This includes oil or grease spills. ?What can I do in the bathroom? ?Use night lights. ?Install grab bars by the toilet and in the tub and shower. Do not use towel bars as grab bars. ?Use non-skid mats or decals in the tub or shower. ?If you need to sit down in the shower, use a plastic, non-slip stool. ?Keep the floor dry. Clean up any water that spills on the floor as soon as it happens. ?Remove soap buildup in the tub or shower regularly. ?Attach bath mats securely with double-sided non-slip rug tape. ?Do not have throw rugs and other things on the floor that can make you trip. ?What can I do in the bedroom? ?Use night lights. ?Make sure that you have a light by your bed that is easy to reach. ?Do not use any sheets or blankets that are too big for your bed. They should not hang down onto the floor. ?Have a firm chair that has side arms. You can use this for support while you get dressed. ?Do not have throw rugs and other things on the floor that can make you trip. ?What can I do in the kitchen? ?Clean up any spills right away. ?Avoid walking on wet floors. ?Keep items that you use a lot in easy-to-reach places. ?If you need to reach something above you, use a strong step stool that has a grab bar. ?Keep electrical cords out of the way. ?Do not use floor polish or wax that makes floors slippery. If you  must use wax, use non-skid floor wax. ?Do not have throw rugs and other things on the floor that can make you trip. ?What can I do with my stairs? ?Do not leave any items on the stairs. ?Make sure that there are handrails on both sides of the stairs and use them. Fix handrails that are broken or loose. Make sure that handrails are as long as the stairways. ?Check any carpeting to make sure that it is firmly attached to the stairs. Fix any carpet that is loose  or worn. ?Avoid having throw rugs at the top or bottom of the stairs. If you do have throw rugs, attach them to the floor with carpet tape. ?Make sure that you have a light switch at the top of the stairs an

## 2021-04-02 ENCOUNTER — Encounter: Payer: Self-pay | Admitting: Internal Medicine

## 2021-04-02 ENCOUNTER — Other Ambulatory Visit (INDEPENDENT_AMBULATORY_CARE_PROVIDER_SITE_OTHER): Payer: Medicare HMO

## 2021-04-02 ENCOUNTER — Ambulatory Visit (INDEPENDENT_AMBULATORY_CARE_PROVIDER_SITE_OTHER): Payer: Medicare HMO | Admitting: Internal Medicine

## 2021-04-02 VITALS — BP 122/64 | HR 63 | Temp 98.3°F | Ht 62.0 in | Wt 144.1 lb

## 2021-04-02 DIAGNOSIS — G629 Polyneuropathy, unspecified: Secondary | ICD-10-CM | POA: Diagnosis not present

## 2021-04-02 DIAGNOSIS — I10 Essential (primary) hypertension: Secondary | ICD-10-CM | POA: Diagnosis not present

## 2021-04-02 DIAGNOSIS — R413 Other amnesia: Secondary | ICD-10-CM | POA: Diagnosis not present

## 2021-04-02 DIAGNOSIS — E538 Deficiency of other specified B group vitamins: Secondary | ICD-10-CM

## 2021-04-02 DIAGNOSIS — G4709 Other insomnia: Secondary | ICD-10-CM

## 2021-04-02 DIAGNOSIS — F419 Anxiety disorder, unspecified: Secondary | ICD-10-CM

## 2021-04-02 DIAGNOSIS — N183 Chronic kidney disease, stage 3 unspecified: Secondary | ICD-10-CM | POA: Diagnosis not present

## 2021-04-02 DIAGNOSIS — E119 Type 2 diabetes mellitus without complications: Secondary | ICD-10-CM

## 2021-04-02 DIAGNOSIS — F3340 Major depressive disorder, recurrent, in remission, unspecified: Secondary | ICD-10-CM

## 2021-04-02 DIAGNOSIS — N281 Cyst of kidney, acquired: Secondary | ICD-10-CM

## 2021-04-02 DIAGNOSIS — R69 Illness, unspecified: Secondary | ICD-10-CM | POA: Diagnosis not present

## 2021-04-02 LAB — HEMOGLOBIN A1C: Hgb A1c MFr Bld: 5.5 % (ref 4.6–6.5)

## 2021-04-02 LAB — CBC WITH DIFFERENTIAL/PLATELET
Basophils Absolute: 0 10*3/uL (ref 0.0–0.1)
Basophils Relative: 0.8 % (ref 0.0–3.0)
Eosinophils Absolute: 0.1 10*3/uL (ref 0.0–0.7)
Eosinophils Relative: 2.4 % (ref 0.0–5.0)
HCT: 39.9 % (ref 36.0–46.0)
Hemoglobin: 13.5 g/dL (ref 12.0–15.0)
Lymphocytes Relative: 30.6 % (ref 12.0–46.0)
Lymphs Abs: 1.5 10*3/uL (ref 0.7–4.0)
MCHC: 33.9 g/dL (ref 30.0–36.0)
MCV: 90.7 fl (ref 78.0–100.0)
Monocytes Absolute: 0.5 10*3/uL (ref 0.1–1.0)
Monocytes Relative: 9.6 % (ref 3.0–12.0)
Neutro Abs: 2.8 10*3/uL (ref 1.4–7.7)
Neutrophils Relative %: 56.6 % (ref 43.0–77.0)
Platelets: 221 10*3/uL (ref 150.0–400.0)
RBC: 4.4 Mil/uL (ref 3.87–5.11)
RDW: 13.5 % (ref 11.5–15.5)
WBC: 4.9 10*3/uL (ref 4.0–10.5)

## 2021-04-02 LAB — COMPREHENSIVE METABOLIC PANEL WITH GFR
ALT: 11 U/L (ref 0–35)
AST: 14 U/L (ref 0–37)
Albumin: 4.4 g/dL (ref 3.5–5.2)
Alkaline Phosphatase: 54 U/L (ref 39–117)
BUN: 19 mg/dL (ref 6–23)
CO2: 26 meq/L (ref 19–32)
Calcium: 9.4 mg/dL (ref 8.4–10.5)
Chloride: 106 meq/L (ref 96–112)
Creatinine, Ser: 0.99 mg/dL (ref 0.40–1.20)
GFR: 55.68 mL/min — ABNORMAL LOW
Glucose, Bld: 84 mg/dL (ref 70–99)
Potassium: 3.8 meq/L (ref 3.5–5.1)
Sodium: 141 meq/L (ref 135–145)
Total Bilirubin: 0.7 mg/dL (ref 0.2–1.2)
Total Protein: 6.7 g/dL (ref 6.0–8.3)

## 2021-04-02 LAB — T4, FREE: Free T4: 1.06 ng/dL (ref 0.60–1.60)

## 2021-04-02 LAB — VITAMIN B12: Vitamin B-12: 1020 pg/mL — ABNORMAL HIGH (ref 211–911)

## 2021-04-02 LAB — TSH: TSH: 1.41 u[IU]/mL (ref 0.35–5.50)

## 2021-04-02 NOTE — Assessment & Plan Note (Signed)
B feet Try Blue Emu cream ?

## 2021-04-02 NOTE — Assessment & Plan Note (Addendum)
Better - sleeping 6h/night ?Cont on Fluoxetine  - re-start fluoxetine at 20 mg/d ?

## 2021-04-02 NOTE — Patient Instructions (Signed)
Blue Emu cream ?

## 2021-04-02 NOTE — Assessment & Plan Note (Signed)
We can repeat renal US this summer or fall ?

## 2021-04-02 NOTE — Assessment & Plan Note (Signed)
Better - sleeping 6h/night ?Temazepam - pt stopped due to fears of addiction ?

## 2021-04-02 NOTE — Assessment & Plan Note (Addendum)
Chronic depression/OCD ?Start back on fluoxetine and use prn Xanax  ?

## 2021-04-02 NOTE — Progress Notes (Signed)
? ?Subjective:  ?Patient ID: Tiffany Gibbs, female    DOB: 10-Apr-1945  Age: 76 y.o. MRN: [id number] ? ?CC: Follow-up ? ? ?HPI ?Tiffany Gibbs presents for depression, insomnia, CRI, hyperglycemia ? ?Outpatient Medications Prior to Visit  ?Medication Sig Dispense Refill  ? Blood Glucose Monitoring Suppl (ONETOUCH VERIO) w/Device KIT Use to check blood sugar daily. DX E11.9 1 kit 0  ? Cyanocobalamin (B-12) 5000 MCG CAPS Take 1 tablet by mouth once a week.     ? diphenhydrAMINE (BENADRYL) 25 mg capsule Take 25 mg by mouth daily as needed (allergies).     ? FLUoxetine (PROZAC) 20 MG tablet Take 1-2 tablets (20-40 mg total) by mouth daily. 60 tablet 3  ? fluticasone (FLONASE) 50 MCG/ACT nasal spray Place 2 sprays into both nostrils daily. 16 g 6  ? glucose blood (ONETOUCH VERIO) test strip USE STRIP TO CHECK GLUCOSE TWICE DAILY AS NEEDED 200 each 0  ? loratadine (CLARITIN) 10 MG tablet Take 1 tablet (10 mg total) by mouth daily. 30 tablet 11  ? magnesium oxide (MAG-OX) 400 MG tablet Take 400 mg by mouth daily.    ? metFORMIN (GLUCOPHAGE) 500 MG tablet TAKE 1 TABLET BY MOUTH TWICE A DAY WITH A MEAL 180 tablet 2  ? Omega-3 Fatty Acids (FISH OIL) 1200 MG CAPS Take 1 capsule by mouth in the morning and at bedtime.    ? OneTouch Delica Lancets 19J MISC USE 1  TO CHECK GLUCOSE IN THE MORNING AND 1 AT BEDTIME 200 each 4  ? potassium chloride (KLOR-CON) 8 MEQ tablet Take 1 tablet (8 mEq total) by mouth 2 (two) times daily. 180 tablet 3  ? Turmeric Curcumin 500 MG CAPS Take 1 capsule in the morning and 2 at bedtime    ? ALPRAZolam (XANAX) 0.5 MG tablet Take 1-2 tablets (0.5-1 mg total) by mouth 2 (two) times daily as needed for anxiety. (Patient not taking: Reported on 04/01/2021) 60 tablet 1  ? temazepam (RESTORIL) 15 MG capsule Take 1 capsule (15 mg total) by mouth at bedtime as needed for sleep. (Patient not taking: Reported on 04/01/2021) 30 capsule 1  ? ?No facility-administered medications prior to visit.   ? ? ?ROS: ?Review of Systems  ?Constitutional:  Negative for activity change, appetite change, chills, fatigue and unexpected weight change.  ?HENT:  Negative for congestion, mouth sores and sinus pressure.   ?Eyes:  Negative for visual disturbance.  ?Respiratory:  Negative for cough and chest tightness.   ?Gastrointestinal:  Negative for abdominal pain and nausea.  ?Genitourinary:  Negative for difficulty urinating, frequency and vaginal pain.  ?Musculoskeletal:  Negative for back pain and gait problem.  ?Skin:  Negative for pallor and rash.  ?Neurological:  Negative for dizziness, tremors, weakness, numbness and headaches.  ?Psychiatric/Behavioral:  Positive for sleep disturbance. Negative for confusion. The patient is nervous/anxious.   ? ?Objective:  ?BP 122/64   Pulse 63   Temp 98.3 ?F (36.8 ?C) (Oral)   Ht 5' 2"  (1.575 m)   Wt 144 lb 2 oz (65.4 kg)   SpO2 97%   BMI 26.36 kg/m?  ? ?BP Readings from Last 3 Encounters:  ?04/02/21 122/64  ?04/01/21 118/70  ?02/24/21 130/80  ? ? ?Wt Readings from Last 3 Encounters:  ?04/02/21 144 lb 2 oz (65.4 kg)  ?04/01/21 145 lb 3.2 oz (65.9 kg)  ?02/24/21 148 lb (67.1 kg)  ? ? ?Physical Exam ?Constitutional:   ?   General: She is not in acute distress. ?  Appearance: She is well-developed.  ?HENT:  ?   Head: Normocephalic.  ?   Right Ear: External ear normal.  ?   Left Ear: External ear normal.  ?   Nose: Nose normal.  ?Eyes:  ?   General:     ?   Right eye: No discharge.     ?   Left eye: No discharge.  ?   Conjunctiva/sclera: Conjunctivae normal.  ?   Pupils: Pupils are equal, round, and reactive to light.  ?Neck:  ?   Thyroid: No thyromegaly.  ?   Vascular: No JVD.  ?   Trachea: No tracheal deviation.  ?Cardiovascular:  ?   Rate and Rhythm: Normal rate and regular rhythm.  ?   Heart sounds: Normal heart sounds.  ?Pulmonary:  ?   Effort: No respiratory distress.  ?   Breath sounds: No stridor. No wheezing.  ?Abdominal:  ?   General: Bowel sounds are normal. There is  no distension.  ?   Palpations: Abdomen is soft. There is no mass.  ?   Tenderness: There is no abdominal tenderness. There is no guarding or rebound.  ?Musculoskeletal:     ?   General: No tenderness.  ?   Cervical back: Normal range of motion and neck supple. No rigidity.  ?Lymphadenopathy:  ?   Cervical: No cervical adenopathy.  ?Skin: ?   Findings: No erythema or rash.  ?Neurological:  ?   Cranial Nerves: No cranial nerve deficit.  ?   Motor: No abnormal muscle tone.  ?   Coordination: Coordination normal.  ?   Deep Tendon Reflexes: Reflexes normal.  ?Psychiatric:     ?   Behavior: Behavior normal.     ?   Thought Content: Thought content normal.     ?   Judgment: Judgment normal.  ? ? ?Lab Results  ?Component Value Date  ? WBC 4.9 04/02/2021  ? HGB 13.5 04/02/2021  ? HCT 39.9 04/02/2021  ? PLT 221.0 04/02/2021  ? GLUCOSE 84 04/02/2021  ? CHOL 191 12/26/2020  ? TRIG 70.0 12/26/2020  ? HDL 74.80 12/26/2020  ? LDLDIRECT 148.2 02/03/2013  ? LDLCALC 103 (H) 12/26/2020  ? ALT 11 04/02/2021  ? AST 14 04/02/2021  ? NA 141 04/02/2021  ? K 3.8 04/02/2021  ? CL 106 04/02/2021  ? CREATININE 0.99 04/02/2021  ? BUN 19 04/02/2021  ? CO2 26 04/02/2021  ? TSH 1.41 04/02/2021  ? INR 1.0 10/28/2018  ? HGBA1C 5.5 04/02/2021  ? MICROALBUR 1.6 04/17/2020  ? ? ?No results found. ? ?Assessment & Plan:  ? ?Problem List Items Addressed This Visit   ? ? Anxiety  ?  Better - sleeping 6h/night ?Cont on Fluoxetine  - re-start fluoxetine at 20 mg/d ?  ?  ? Depression  ?  Chronic depression/OCD ?Start back on fluoxetine and use prn Xanax  ?  ?  ? Hypertension, essential - Primary  ? Memory difficulty  ?  Seems to be doing better.  Getting more sleep they have definitely helped ?  ?  ? DM2 (diabetes mellitus, type 2) ([place])  ?  Doing well.  Continue to monitor hemoglobin A1c ?  ?  ? Relevant Orders  ? Comprehensive metabolic panel  ? Hemoglobin A1c  ? Microalbumin / creatinine urine ratio  ? Urinalysis  ? Kidney cysts  ?  We can repeat renal US  this summer or fall ?  ?  ? CRI (chronic renal insufficiency), stage 3 (  moderate) ([place])  ?  Follow-up with Dr Marval Regal ?Continue to hydrate well ?  ?  ? Insomnia disorder  ?  Better - sleeping 6h/night ?Temazepam - pt stopped due to fears of addiction ?  ?  ? Neuropathy  ?  B feet Try Blue Emu cream ?  ?  ?  ? ? ?No orders of the defined types were placed in this encounter. ?  ? ? ?Follow-up: Return in about 3 months (around 07/03/2021) for a follow-up visit. ? ?Walker Kehr, MD ?

## 2021-04-06 NOTE — Assessment & Plan Note (Signed)
Follow-up with Dr Marval Regal ?Continue to hydrate well ?

## 2021-04-06 NOTE — Assessment & Plan Note (Signed)
Seems to be doing better.  Getting more sleep they have definitely helped ?

## 2021-04-06 NOTE — Assessment & Plan Note (Signed)
Doing well.  Continue to monitor hemoglobin A1c ?

## 2021-05-01 ENCOUNTER — Other Ambulatory Visit: Payer: Self-pay | Admitting: Internal Medicine

## 2021-05-02 ENCOUNTER — Other Ambulatory Visit: Payer: Self-pay | Admitting: Internal Medicine

## 2021-05-02 DIAGNOSIS — R413 Other amnesia: Secondary | ICD-10-CM

## 2021-05-02 DIAGNOSIS — I8002 Phlebitis and thrombophlebitis of superficial vessels of left lower extremity: Secondary | ICD-10-CM

## 2021-05-02 DIAGNOSIS — I10 Essential (primary) hypertension: Secondary | ICD-10-CM

## 2021-05-02 DIAGNOSIS — F411 Generalized anxiety disorder: Secondary | ICD-10-CM

## 2021-05-02 DIAGNOSIS — J4520 Mild intermittent asthma, uncomplicated: Secondary | ICD-10-CM

## 2021-05-02 DIAGNOSIS — E538 Deficiency of other specified B group vitamins: Secondary | ICD-10-CM

## 2021-05-02 DIAGNOSIS — Z0000 Encounter for general adult medical examination without abnormal findings: Secondary | ICD-10-CM

## 2021-05-02 DIAGNOSIS — E119 Type 2 diabetes mellitus without complications: Secondary | ICD-10-CM

## 2021-05-05 ENCOUNTER — Other Ambulatory Visit: Payer: Self-pay | Admitting: Internal Medicine

## 2021-05-27 DIAGNOSIS — R3121 Asymptomatic microscopic hematuria: Secondary | ICD-10-CM | POA: Diagnosis not present

## 2021-05-27 DIAGNOSIS — R350 Frequency of micturition: Secondary | ICD-10-CM | POA: Diagnosis not present

## 2021-06-02 DIAGNOSIS — L602 Onychogryphosis: Secondary | ICD-10-CM | POA: Diagnosis not present

## 2021-06-02 DIAGNOSIS — M2011 Hallux valgus (acquired), right foot: Secondary | ICD-10-CM | POA: Diagnosis not present

## 2021-06-02 DIAGNOSIS — E1142 Type 2 diabetes mellitus with diabetic polyneuropathy: Secondary | ICD-10-CM | POA: Diagnosis not present

## 2021-06-02 DIAGNOSIS — L84 Corns and callosities: Secondary | ICD-10-CM | POA: Diagnosis not present

## 2021-06-02 DIAGNOSIS — M2012 Hallux valgus (acquired), left foot: Secondary | ICD-10-CM | POA: Diagnosis not present

## 2021-06-02 DIAGNOSIS — Z7984 Long term (current) use of oral hypoglycemic drugs: Secondary | ICD-10-CM | POA: Diagnosis not present

## 2021-06-10 ENCOUNTER — Other Ambulatory Visit: Payer: Self-pay | Admitting: Internal Medicine

## 2021-06-10 DIAGNOSIS — E119 Type 2 diabetes mellitus without complications: Secondary | ICD-10-CM

## 2021-06-16 DIAGNOSIS — K8020 Calculus of gallbladder without cholecystitis without obstruction: Secondary | ICD-10-CM | POA: Diagnosis not present

## 2021-06-16 DIAGNOSIS — N2889 Other specified disorders of kidney and ureter: Secondary | ICD-10-CM | POA: Diagnosis not present

## 2021-06-16 DIAGNOSIS — R3121 Asymptomatic microscopic hematuria: Secondary | ICD-10-CM | POA: Diagnosis not present

## 2021-06-16 DIAGNOSIS — N281 Cyst of kidney, acquired: Secondary | ICD-10-CM | POA: Diagnosis not present

## 2021-06-26 ENCOUNTER — Other Ambulatory Visit (INDEPENDENT_AMBULATORY_CARE_PROVIDER_SITE_OTHER): Payer: Medicare HMO

## 2021-06-26 DIAGNOSIS — E119 Type 2 diabetes mellitus without complications: Secondary | ICD-10-CM | POA: Diagnosis not present

## 2021-06-26 DIAGNOSIS — R3121 Asymptomatic microscopic hematuria: Secondary | ICD-10-CM | POA: Diagnosis not present

## 2021-06-26 LAB — COMPREHENSIVE METABOLIC PANEL
ALT: 10 U/L (ref 0–35)
AST: 14 U/L (ref 0–37)
Albumin: 4.3 g/dL (ref 3.5–5.2)
Alkaline Phosphatase: 52 U/L (ref 39–117)
BUN: 17 mg/dL (ref 6–23)
CO2: 26 mEq/L (ref 19–32)
Calcium: 9.6 mg/dL (ref 8.4–10.5)
Chloride: 107 mEq/L (ref 96–112)
Creatinine, Ser: 0.96 mg/dL (ref 0.40–1.20)
GFR: 57.68 mL/min — ABNORMAL LOW (ref >60.00)
Glucose, Bld: 94 mg/dL (ref 70–99)
Potassium: 4 mEq/L (ref 3.5–5.1)
Sodium: 142 mEq/L (ref 135–145)
Total Bilirubin: 0.7 mg/dL (ref 0.2–1.2)
Total Protein: 7.1 g/dL (ref 6.0–8.3)

## 2021-06-26 LAB — URINALYSIS, ROUTINE W REFLEX MICROSCOPIC
Bilirubin Urine: NEGATIVE
Ketones, ur: NEGATIVE
Nitrite: NEGATIVE
Specific Gravity, Urine: 1.02 (ref 1.000–1.030)
Total Protein, Urine: NEGATIVE
Urine Glucose: NEGATIVE
Urobilinogen, UA: 0.2 (ref 0.0–1.0)
pH: 6 (ref 5.0–8.0)

## 2021-06-26 LAB — MICROALBUMIN / CREATININE URINE RATIO
Creatinine,U: 141.2 mg/dL
Microalb Creat Ratio: 4.3 mg/g (ref 0.0–30.0)
Microalb, Ur: 6 mg/dL — ABNORMAL HIGH (ref 0.0–1.9)

## 2021-06-26 LAB — HEMOGLOBIN A1C: Hgb A1c MFr Bld: 5.5 % (ref 4.6–6.5)

## 2021-07-02 ENCOUNTER — Ambulatory Visit (INDEPENDENT_AMBULATORY_CARE_PROVIDER_SITE_OTHER): Payer: Medicare HMO | Admitting: Internal Medicine

## 2021-07-02 ENCOUNTER — Encounter: Payer: Self-pay | Admitting: Internal Medicine

## 2021-07-02 ENCOUNTER — Ambulatory Visit: Payer: Medicare HMO | Admitting: Internal Medicine

## 2021-07-02 VITALS — BP 120/78 | HR 70 | Temp 98.7°F | Ht 62.0 in | Wt 147.0 lb

## 2021-07-02 DIAGNOSIS — E119 Type 2 diabetes mellitus without complications: Secondary | ICD-10-CM

## 2021-07-02 DIAGNOSIS — E538 Deficiency of other specified B group vitamins: Secondary | ICD-10-CM | POA: Diagnosis not present

## 2021-07-02 DIAGNOSIS — R1031 Right lower quadrant pain: Secondary | ICD-10-CM | POA: Insufficient documentation

## 2021-07-02 DIAGNOSIS — N183 Chronic kidney disease, stage 3 unspecified: Secondary | ICD-10-CM

## 2021-07-02 DIAGNOSIS — R209 Unspecified disturbances of skin sensation: Secondary | ICD-10-CM | POA: Diagnosis not present

## 2021-07-02 DIAGNOSIS — I10 Essential (primary) hypertension: Secondary | ICD-10-CM | POA: Diagnosis not present

## 2021-07-02 DIAGNOSIS — R413 Other amnesia: Secondary | ICD-10-CM | POA: Diagnosis not present

## 2021-07-02 DIAGNOSIS — G63 Polyneuropathy in diseases classified elsewhere: Secondary | ICD-10-CM

## 2021-07-02 DIAGNOSIS — E889 Metabolic disorder, unspecified: Secondary | ICD-10-CM

## 2021-07-02 DIAGNOSIS — R61 Generalized hyperhidrosis: Secondary | ICD-10-CM | POA: Insufficient documentation

## 2021-07-02 NOTE — Assessment & Plan Note (Signed)
MCD

## 2021-07-02 NOTE — Progress Notes (Signed)
Subjective:  Patient ID: Tiffany Gibbs, female    DOB: [birth year]  Age: 76 y.o. MRN: [id number]  CC: No chief complaint on file.   HPI Tiffany Gibbs presents for OAB, CRI, anxiety C/o R foot numbness - hot feeling break pedal - numbness; foot H/o R radiculopathy, neuropathy  pt had an MRI C/o cold feet  Outpatient Medications Prior to Visit  Medication Sig Dispense Refill   Blood Glucose Monitoring Suppl (ONETOUCH VERIO) w/Device KIT Use to check blood sugar daily. DX E11.9 1 kit 0   Cyanocobalamin (B-12) 5000 MCG CAPS Take 1 tablet by mouth once a week.      diphenhydrAMINE (BENADRYL) 25 mg capsule Take 25 mg by mouth daily as needed (allergies).      FLUoxetine (PROZAC) 20 MG tablet Take 1-2 tablets (20-40 mg total) by mouth daily. 60 tablet 3   fluticasone (FLONASE) 50 MCG/ACT nasal spray Place 2 sprays into both nostrils daily. 16 g 6   glucose blood (ONETOUCH VERIO) test strip USE 1 STRIP TO CHECK GLUCOSE TWICE DAILY AS NEEDED 200 each 1   loratadine (CLARITIN) 10 MG tablet Take 1 tablet (10 mg total) by mouth daily. 30 tablet 11   magnesium oxide (MAG-OX) 400 MG tablet Take 400 mg by mouth daily.     metFORMIN (GLUCOPHAGE) 500 MG tablet TAKE 1 TABLET BY MOUTH TWICE DAILY WITH MEALS 180 tablet 1   Omega-3 Fatty Acids (FISH OIL) 1200 MG CAPS Take 1 capsule by mouth in the morning and at bedtime.     OneTouch Delica Lancets 59D MISC USE 1  TO CHECK GLUCOSE IN THE MORNING AND 1 AT BEDTIME 200 each 1   potassium chloride (KLOR-CON) 8 MEQ tablet Take 1 tablet by mouth twice daily 180 tablet 1   Turmeric Curcumin 500 MG CAPS Take 1 capsule in the morning and 2 at bedtime     ALPRAZolam (XANAX) 0.5 MG tablet Take 1-2 tablets (0.5-1 mg total) by mouth 2 (two) times daily as needed for anxiety. (Patient not taking: Reported on 04/01/2021) 60 tablet 1   temazepam (RESTORIL) 15 MG capsule Take 1 capsule (15 mg total) by mouth at bedtime as needed for sleep. (Patient not  taking: Reported on 04/01/2021) 30 capsule 1   No facility-administered medications prior to visit.    ROS: Review of Systems  Constitutional:  Negative for activity change, appetite change, chills, fatigue and unexpected weight change.  HENT:  Negative for congestion, mouth sores and sinus pressure.   Eyes:  Negative for visual disturbance.  Respiratory:  Negative for cough and chest tightness.   Gastrointestinal:  Negative for abdominal pain and nausea.  Genitourinary:  Negative for difficulty urinating, frequency and vaginal pain.  Musculoskeletal:  Negative for back pain and gait problem.  Skin:  Negative for pallor and rash.  Neurological:  Negative for dizziness, tremors, weakness, numbness and headaches.  Psychiatric/Behavioral:  Negative for confusion and sleep disturbance. The patient is nervous/anxious.     Objective:  BP 120/78 (BP Location: Left Arm, Patient Position: Sitting, Cuff Size: Normal)   Pulse 70   Temp 98.7 F (37.1 C) (Oral)   Ht 5' 2"  (1.575 m)   Wt 147 lb (66.7 kg)   SpO2 99%   BMI 26.89 kg/m   BP Readings from Last 3 Encounters:  07/02/21 120/78  04/02/21 122/64  04/01/21 118/70    Wt Readings from Last 3 Encounters:  07/02/21 147 lb (66.7 kg)  04/02/21 144 lb 2  oz (65.4 kg)  04/01/21 145 lb 3.2 oz (65.9 kg)    Physical Exam Constitutional:      General: She is not in acute distress.    Appearance: Normal appearance. She is well-developed.  HENT:     Head: Normocephalic.     Right Ear: External ear normal.     Left Ear: External ear normal.     Nose: Nose normal.  Eyes:     General:        Right eye: No discharge.        Left eye: No discharge.     Conjunctiva/sclera: Conjunctivae normal.     Pupils: Pupils are equal, round, and reactive to light.  Neck:     Thyroid: No thyromegaly.     Vascular: No JVD.     Trachea: No tracheal deviation.  Cardiovascular:     Rate and Rhythm: Normal rate and regular rhythm.     Heart sounds:  Normal heart sounds.  Pulmonary:     Effort: No respiratory distress.     Breath sounds: No stridor. No wheezing.  Abdominal:     General: Bowel sounds are normal. There is no distension.     Palpations: Abdomen is soft. There is no mass.     Tenderness: There is no abdominal tenderness. There is no guarding or rebound.  Musculoskeletal:        General: No tenderness.     Cervical back: Normal range of motion and neck supple. No rigidity.  Lymphadenopathy:     Cervical: No cervical adenopathy.  Skin:    Findings: No erythema or rash.  Neurological:     Cranial Nerves: No cranial nerve deficit.     Motor: No abnormal muscle tone.     Coordination: Coordination normal.     Deep Tendon Reflexes: Reflexes normal.  Psychiatric:        Behavior: Behavior normal.        Thought Content: Thought content normal.        Judgment: Judgment normal.   B cold feet MS ok  Lab Results  Component Value Date   WBC 4.9 04/02/2021   HGB 13.5 04/02/2021   HCT 39.9 04/02/2021   PLT 221.0 04/02/2021   GLUCOSE 94 06/26/2021   CHOL 191 12/26/2020   TRIG 70.0 12/26/2020   HDL 74.80 12/26/2020   LDLDIRECT 148.2 02/03/2013   LDLCALC 103 (H) 12/26/2020   ALT 10 06/26/2021   AST 14 06/26/2021   NA 142 06/26/2021   K 4.0 06/26/2021   CL 107 06/26/2021   CREATININE 0.96 06/26/2021   BUN 17 06/26/2021   CO2 26 06/26/2021   TSH 1.41 04/02/2021   INR 1.0 10/28/2018   HGBA1C 5.5 06/26/2021   MICROALBUR 6.0 (H) 06/26/2021    No results found.  Assessment & Plan:   Problem List Items Addressed This Visit     B12 deficiency     Cont w/Vit B12      Bilateral cold feet - Primary    New Change car seat position Vasc arterial US Neurology consult      Relevant Orders   Ambulatory referral to Neurology   VAS [place] LOWER EXT ART SEG MULTI (SEGMENTALS & LE RAYNAUDS)   CRI (chronic renal insufficiency), stage 3 (moderate) ([place])    F/u w/Dr [facility] well Labs reviewed       Relevant Orders   Comprehensive metabolic panel   Hemoglobin A1c   DM2 (diabetes mellitus, type 2) ([place])  Cont on  Metformin      Relevant Orders   Hemoglobin A1c   Hypertension, essential    NAS diet  Doing well      Memory difficulty    MCD      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     Follow-up: Return in about 3 months (around 10/02/2021) for a follow-up visit.  Walker Kehr, MD

## 2021-07-02 NOTE — Assessment & Plan Note (Signed)
F/u w/Dr [facility] well Labs reviewed

## 2021-07-02 NOTE — Assessment & Plan Note (Signed)
New Change car seat position Vasc arterial US Neurology consult

## 2021-07-02 NOTE — Assessment & Plan Note (Signed)
Worse R>L LE Change car seat position Vasc arterial US Neurology consult

## 2021-07-02 NOTE — Assessment & Plan Note (Signed)
Cont on Metformin 

## 2021-07-02 NOTE — Assessment & Plan Note (Signed)
Cont w/Vit B12 

## 2021-07-02 NOTE — Assessment & Plan Note (Signed)
NAS diet  Doing well

## 2021-07-18 DIAGNOSIS — L820 Inflamed seborrheic keratosis: Secondary | ICD-10-CM | POA: Diagnosis not present

## 2021-07-18 DIAGNOSIS — D225 Melanocytic nevi of trunk: Secondary | ICD-10-CM | POA: Diagnosis not present

## 2021-07-23 ENCOUNTER — Telehealth: Payer: Self-pay | Admitting: Internal Medicine

## 2021-07-23 DIAGNOSIS — N281 Cyst of kidney, acquired: Secondary | ICD-10-CM

## 2021-07-23 NOTE — Telephone Encounter (Signed)
Pt called in and wants to know if she should have an MRI on her kidneys.   States she has cysts on kidneys- and states Dr. Alain Marion advised her to have them done once a year.   Informed pt there is no referral, outside of neurology.   Requesting call back from assistant ASAP.

## 2021-07-24 ENCOUNTER — Other Ambulatory Visit: Payer: Self-pay | Admitting: Internal Medicine

## 2021-07-28 ENCOUNTER — Ambulatory Visit (INDEPENDENT_AMBULATORY_CARE_PROVIDER_SITE_OTHER): Payer: Medicare HMO | Admitting: Diagnostic Neuroimaging

## 2021-07-28 ENCOUNTER — Encounter: Payer: Self-pay | Admitting: Diagnostic Neuroimaging

## 2021-07-28 ENCOUNTER — Telehealth: Payer: Self-pay | Admitting: Internal Medicine

## 2021-07-28 VITALS — BP 122/67 | HR 62 | Ht 62.0 in | Wt 150.0 lb

## 2021-07-28 DIAGNOSIS — G609 Hereditary and idiopathic neuropathy, unspecified: Secondary | ICD-10-CM

## 2021-07-28 NOTE — Progress Notes (Signed)
[facility]  PATIENT: Tiffany Gibbs DOB: 12/21/1945  REFERRING CLINICIAN: [name], Evie Lacks, MD HISTORY FROM: patient  REASON FOR VISIT: new consult    HISTORICAL  CHIEF COMPLAINT:  Chief Complaint  Patient presents with   Numbness    RM 6 Alone Pt is well, here for cold feet and numbness up to knee. Symptoms has worsen since last visit in 2021. R leg is worse than L     HISTORY OF PRESENT ILLNESS:   UPDATE (07/28/21, VRP): Since last visit, doing about the same. More issues with feeling the car pedals. Severity is moderate. No alleviating or aggravating factors. Tolerating meds.    UPDATE (06/06/19, VRP): Since last visit, doing well and HA are resolved (now on CPAP for OSA).  Now having leg cramps, numbness in legs since 2017. Symptoms are intermittent (night time). Severity is moderate. No alleviating or aggravating factors. Could not tolerate gabapentin or lyrica.   UPDATE 06/12/15: Since last visit, headaches are improved. Dizzy spells are faint and rare (~1 per week, 1 second). Meds are being adjusted. Now back on CPAP x few weeks.    PRIOR HPI (04/30/15): 76 year old right-handed female here for evaluation of headaches and dizziness. Patient has history of migraine headaches in her 35s with severe global headaches with photophobia. No nausea vomiting. Headaches seem to resolve by the time she was 76 years old. Since February 2017 patient has had intermittent lightheaded sensations, room spinning sensations, dizzy spells lasting a few seconds at a time. Symptoms would occur a few times per week. Positional changes would seem to trigger headaches such as turning, bending, sitting up or standing up. No nausea or vomiting. No passing out, slurred speech, trouble talking, double vision. Patient would have some balance difficulties and feeling like she could fall down and have to hold onto something. In April 2017 patient was at [employer] center and  had a severe episode that scared her. In addition since November 2016 patient has been having increasing increasing right-sided headaches, right-sided neck pain, right occipital pain with pounding sensation lasting for 2 hours. No nausea or vomiting. No photophobia or phonophobia. Patient having at least 2 headaches per month.   REVIEW OF SYSTEMS: Full 14 system review of systems performed and negative with exception of: as per HPI.  ALLERGIES: Allergies  Allergen Reactions   Celecoxib     swell   Diazepam     Does not agree with me   Enalapril     cough   Gabapentin Other (See Comments)    dizziness   Losartan     falls   Lovastatin     REACTION: aches   Lyrica [Pregabalin] Other (See Comments)    dizziness   Naproxen    Pravachol [Pravastatin Sodium]     Nausea    Zetia [Ezetimibe]     Myalgias in LEs    HOME MEDICATIONS: Outpatient Medications Prior to Visit  Medication Sig Dispense Refill   Blood Glucose Monitoring Suppl (ONETOUCH VERIO) w/Device KIT Use to check blood sugar daily. DX E11.9 1 kit 0   Cyanocobalamin (B-12) 5000 MCG CAPS Take 1 tablet by mouth once a week.      diphenhydrAMINE (BENADRYL) 25 mg capsule Take 25 mg by mouth daily as needed (allergies).      FLUoxetine (PROZAC) 20 MG tablet TAKE 1 TO 2 TABLETS BY MOUTH ONCE DAILY 60 tablet 5   fluticasone (FLONASE) 50 MCG/ACT nasal spray Place 2 sprays into both  nostrils daily. 16 g 6   glucose blood (ONETOUCH VERIO) test strip USE 1 STRIP TO CHECK GLUCOSE TWICE DAILY AS NEEDED 200 each 1   loratadine (CLARITIN) 10 MG tablet Take 1 tablet (10 mg total) by mouth daily. 30 tablet 11   magnesium oxide (MAG-OX) 400 MG tablet Take 400 mg by mouth daily.     metFORMIN (GLUCOPHAGE) 500 MG tablet TAKE 1 TABLET BY MOUTH TWICE DAILY WITH MEALS 180 tablet 1   Omega-3 Fatty Acids (FISH OIL) 1200 MG CAPS Take 1 capsule by mouth in the morning and at bedtime.     OneTouch Delica Lancets 19T MISC USE 1  TO CHECK GLUCOSE IN  THE MORNING AND 1 AT BEDTIME 200 each 1   potassium chloride (KLOR-CON) 8 MEQ tablet Take 1 tablet by mouth twice daily 180 tablet 1   Turmeric Curcumin 500 MG CAPS Take 1 capsule in the morning and 2 at bedtime     No facility-administered medications prior to visit.    PAST MEDICAL HISTORY: Past Medical History:  Diagnosis Date   Anxiety    Cataract    Depression    DM type 2 (diabetes mellitus, type 2) (HCC)    Fibromyalgia    H/O dizziness    Hives    from tomatoes   HTN (hypertension)    Hyperlipidemia    Internal hemorrhoids    Kidney disease    Low back pain    OSA (obstructive sleep apnea)    uses c-pap   Osteoarthritis    Osteoporosis    SOB (shortness of breath) on exertion    Tubular adenoma of colon     PAST SURGICAL HISTORY: Past Surgical History:  Procedure Laterality Date   cervical cryotherapy     cervix   CYSTOCELE REPAIR N/A 11/01/2018   Procedure: CYSTOSCOPY ANTERIOR REPAIR (CYSTOCELE);  Surgeon: Bjorn Loser, MD;  Location: [facility];  Service: Urology;  Laterality: N/A;   DRUG INDUCED ENDOSCOPY N/A 08/14/2020   Procedure: DRUG INDUCED ENDOSCOPY;  Surgeon: Jerrell Belfast, MD;  Location: [place];  Service: ENT;  Laterality: N/A;   EYE SURGERY     Cataracts/Bil   LASIK Bilateral    TOTAL ABDOMINAL HYSTERECTOMY     TOTAL HIP ARTHROPLASTY  09/2008   right- [place]/   TOTAL HIP ARTHROPLASTY Left 11/28/2012   Procedure: TOTAL HIP ARTHROPLASTY;  Surgeon: Kerin Salen, MD;  Location: [place];  Service: Orthopedics;  Laterality: Left;    FAMILY HISTORY: Family History  Problem Relation Age of Onset   Hypertension Mother    Diabetes Mother    Other Mother        DVT   Heart disease Father    Multiple sclerosis Father    Heart attack Father    Kidney disease Brother 50       ESRD    SOCIAL HISTORY: Social History   Socioeconomic History   Marital status: Married    Spouse name: Sam   Number of children: 1   Years of  education: 12    Highest education level: Not on file  Occupational History   Occupation: N/A  Tobacco Use   Smoking status: Former    Packs/day: 2.00    Years: 25.00    Total pack years: 50.00    Types: Cigarettes    Quit date: 01/13/1983    Years since quitting: 38.5   Smokeless tobacco: Never  Vaping Use   Vaping Use: Never used  Substance and Sexual Activity   Alcohol use: No    Alcohol/week: 0.0 standard drinks of alcohol   Drug use: No   Sexual activity: Not Currently    Birth control/protection: Post-menopausal, Surgical  Other Topics Concern   Not on file  Social History Narrative   Was divorced - newly re-married as of 2009, Sam   1 son      Caffeine use- coffee 1 cup daily    Social Determinants of Health   Financial Resource Strain: Low Risk  (04/01/2021)   Overall Financial Resource Strain (CARDIA)    Difficulty of Paying Living Expenses: Not hard at all  Food Insecurity: No Food Insecurity (04/01/2021)   Hunger Vital Sign    Worried About Running Out of Food in the Last Year: Never true    Ran Out of Food in the Last Year: Never true  Transportation Needs: No Transportation Needs (04/01/2021)   PRAPARE - Hydrologist (Medical): No    Lack of Transportation (Non-Medical): No  Physical Activity: Sufficiently Active (04/01/2021)   Exercise Vital Sign    Days of Exercise per Week: 5 days    Minutes of Exercise per Session: 30 min  Stress: No Stress Concern Present (04/01/2021)   [place]    Feeling of Stress : Not at all  Social Connections: [place] (04/01/2021)   Social Connection and Isolation Panel [NHANES]    Frequency of Communication with Friends and Family: More than three times a week    Frequency of Social Gatherings with Friends and Family: More than three times a week    Attends Religious Services: More than 4 times per year    Active Member of  [employer] or Organizations: Yes    Attends Archivist Meetings: More than 4 times per year    Marital Status: Married  Human resources officer Violence: Not At Risk (04/01/2021)   Humiliation, Afraid, Rape, and Kick questionnaire    Fear of Current or Ex-Partner: No    Emotionally Abused: No    Physically Abused: No    Sexually Abused: No     PHYSICAL EXAM  GENERAL EXAM/CONSTITUTIONAL: Vitals:  Vitals:   07/28/21 1448  BP: 122/67  Pulse: 62  Weight: 150 lb (68 kg)  Height: 5' 2"  (1.575 m)   Body mass index is 27.44 kg/m. Wt Readings from Last 3 Encounters:  07/28/21 150 lb (68 kg)  07/02/21 147 lb (66.7 kg)  04/02/21 144 lb 2 oz (65.4 kg)   Patient is in no distress; well developed, nourished and groomed; neck is supple  CARDIOVASCULAR: Examination of carotid arteries is normal; no carotid bruits Regular rate and rhythm, no murmurs Examination of peripheral vascular system by observation and palpation is normal  EYES: Ophthalmoscopic exam of optic discs and posterior segments is normal; no papilledema or hemorrhages No results found.  MUSCULOSKELETAL: Gait, strength, tone, movements noted in Neurologic exam below  NEUROLOGIC: MENTAL STATUS:      No data to display         awake, alert, oriented to person, place and time recent and remote memory intact normal attention and concentration language fluent, comprehension intact, naming intact fund of knowledge appropriate  CRANIAL NERVE:  2nd - no papilledema on fundoscopic exam 2nd, 3rd, 4th, 6th - pupils equal and reactive to light, visual fields full to confrontation, extraocular muscles intact, no nystagmus 5th - facial sensation symmetric 7th - facial strength  symmetric 8th - hearing intact 9th - palate elevates symmetrically, uvula midline 11th - shoulder shrug symmetric 12th - tongue protrusion midline  MOTOR:  normal bulk and tone, full strength in the BUE, BLE  SENSORY:  normal and symmetric  to light touch, pinprick, temperature, vibration; EXCEPT DECR VIB AT TOES  COORDINATION:  finger-nose-finger, fine finger movements normal  REFLEXES:  deep tendon reflexes TRACE and symmetric  GAIT/STATION:  narrow based gait     DIAGNOSTIC DATA (LABS, IMAGING, TESTING) - I reviewed patient records, labs, notes, testing and imaging myself where available.  Lab Results  Component Value Date   WBC 4.9 04/02/2021   HGB 13.5 04/02/2021   HCT 39.9 04/02/2021   MCV 90.7 04/02/2021   PLT 221.0 04/02/2021      Component Value Date/Time   NA 142 06/26/2021 1127   K 4.0 06/26/2021 1127   CL 107 06/26/2021 1127   CO2 26 06/26/2021 1127   GLUCOSE 94 06/26/2021 1127   BUN 17 06/26/2021 1127   CREATININE 0.96 06/26/2021 1127   CREATININE 0.99 (H) 08/29/2019 0814   CALCIUM 9.6 06/26/2021 1127   PROT 7.1 06/26/2021 1127   PROT 7.5 06/06/2019 1350   ALBUMIN 4.3 06/26/2021 1127   AST 14 06/26/2021 1127   ALT 10 06/26/2021 1127   ALKPHOS 52 06/26/2021 1127   BILITOT 0.7 06/26/2021 1127   GFRNONAA >60 08/09/2020 1306   GFRNONAA 56 (L) 08/29/2019 0814   GFRAA 65 08/29/2019 0814   Lab Results  Component Value Date   CHOL 191 12/26/2020   HDL 74.80 12/26/2020   LDLCALC 103 (H) 12/26/2020   LDLDIRECT 148.2 02/03/2013   TRIG 70.0 12/26/2020   CHOLHDL 3 12/26/2020   Lab Results  Component Value Date   HGBA1C 5.5 06/26/2021   Lab Results  Component Value Date   VITAMINB12 1,020 (H) 04/02/2021   Lab Results  Component Value Date   TSH 1.41 04/02/2021    06/06/19 neuropathy labs - SPEP, ANA, ANCA, HIV, RPR, HEP B, HEP C, B1, B6 --> normal  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  05/13/15 MRI brain  - normal  05/13/15 MRA head / neck  - normal  07/12/19 MRI lumbar spine (without) demonstrating: - At L4-5: disc bulging and facet hypertrophy with no spinal stenosis or foraminal narrowing. - Right renal cyst (4.3cm).    ASSESSMENT AND PLAN  76 y.o. year  old female here with:  Dx:  1. Idiopathic neuropathy      PLAN:  MUSCLE CRAMPS / IDIOPATHIC NEUROPATHY (extensive workup in 2021 negative) - tried and failed gabapentin, lyrica; consider duloxetine - follow up arterial u/s (ordered by PCP) - continue supportive care  Return for pending if symptoms worsen or fail to improve, return to PCP.    Penni Bombard, MD 6/94/8546, 2:70 PM Certified in Neurology, Neurophysiology and Neuroimaging  [facility] [address] [place], [place] [address] [phone]

## 2021-07-28 NOTE — Telephone Encounter (Signed)
Pt called in and states she has had numbness in leg/ knee, and was told Dr. Alain Marion was supposed to order a sonogram, but she is unsure as to if it was ordered.   Pt requesting if it had not been ordered, to please order ASAP.  Call with questions.

## 2021-07-29 NOTE — Telephone Encounter (Signed)
No need for an MRI.  It is a soft call.  We can do an ultrasound.  I will order.  Thanks

## 2021-07-30 NOTE — Telephone Encounter (Signed)
Arterial vascular ultrasound was ordered on  07/02/2021. Thanks

## 2021-07-31 NOTE — Telephone Encounter (Signed)
Called pt no answer LMOM w/MD response../lmb 

## 2021-08-15 ENCOUNTER — Ambulatory Visit (HOSPITAL_COMMUNITY)
Admission: RE | Admit: 2021-08-15 | Discharge: 2021-08-15 | Disposition: A | Discharge status: Home / Self Care | Payer: Medicare HMO | Source: Ambulatory Visit | Attending: Cardiology | Admitting: Cardiology

## 2021-08-15 DIAGNOSIS — R209 Unspecified disturbances of skin sensation: Secondary | ICD-10-CM | POA: Diagnosis not present

## 2021-08-15 DIAGNOSIS — M79661 Pain in right lower leg: Secondary | ICD-10-CM

## 2021-09-05 ENCOUNTER — Telehealth: Payer: Self-pay | Admitting: Internal Medicine

## 2021-09-05 DIAGNOSIS — R69 Illness, unspecified: Secondary | ICD-10-CM | POA: Diagnosis not present

## 2021-09-05 DIAGNOSIS — F32A Depression, unspecified: Secondary | ICD-10-CM | POA: Diagnosis not present

## 2021-09-05 DIAGNOSIS — Z7984 Long term (current) use of oral hypoglycemic drugs: Secondary | ICD-10-CM | POA: Diagnosis not present

## 2021-09-05 DIAGNOSIS — Z79899 Other long term (current) drug therapy: Secondary | ICD-10-CM | POA: Diagnosis not present

## 2021-09-05 DIAGNOSIS — F0390 Unspecified dementia without behavioral disturbance: Secondary | ICD-10-CM | POA: Diagnosis not present

## 2021-09-05 DIAGNOSIS — E119 Type 2 diabetes mellitus without complications: Secondary | ICD-10-CM | POA: Diagnosis not present

## 2021-09-05 NOTE — Telephone Encounter (Signed)
He agree.  With good see her/work her in next week.  Thanks

## 2021-09-05 NOTE — Telephone Encounter (Signed)
Patient called wanting to see Dr. Alain Marion - she was extremely upset and agitated.  Patient did not want to be seen by anyone else.  Patient kept talking about losing her pocketbook and being confused.  She wanted to know if there was anyone she could call to talk to today.  I looked up [facility] in [place], and also suggested maybe going to the emergency room if she was still feeling like this.  She thanked me for giving her the number and she said she would call them today and then call Dr. Alain Marion next week.

## 2021-09-09 DIAGNOSIS — R3129 Other microscopic hematuria: Secondary | ICD-10-CM | POA: Diagnosis not present

## 2021-09-09 DIAGNOSIS — E1122 Type 2 diabetes mellitus with diabetic chronic kidney disease: Secondary | ICD-10-CM | POA: Diagnosis not present

## 2021-09-09 DIAGNOSIS — G4733 Obstructive sleep apnea (adult) (pediatric): Secondary | ICD-10-CM | POA: Diagnosis not present

## 2021-09-09 DIAGNOSIS — N182 Chronic kidney disease, stage 2 (mild): Secondary | ICD-10-CM | POA: Diagnosis not present

## 2021-09-09 DIAGNOSIS — N281 Cyst of kidney, acquired: Secondary | ICD-10-CM | POA: Diagnosis not present

## 2021-09-09 DIAGNOSIS — I129 Hypertensive chronic kidney disease with stage 1 through stage 4 chronic kidney disease, or unspecified chronic kidney disease: Secondary | ICD-10-CM | POA: Diagnosis not present

## 2021-09-10 ENCOUNTER — Ambulatory Visit (INDEPENDENT_AMBULATORY_CARE_PROVIDER_SITE_OTHER): Payer: Medicare HMO | Admitting: Internal Medicine

## 2021-09-10 ENCOUNTER — Encounter: Payer: Self-pay | Admitting: Internal Medicine

## 2021-09-10 DIAGNOSIS — G4709 Other insomnia: Secondary | ICD-10-CM

## 2021-09-10 DIAGNOSIS — R413 Other amnesia: Secondary | ICD-10-CM | POA: Diagnosis not present

## 2021-09-10 DIAGNOSIS — E538 Deficiency of other specified B group vitamins: Secondary | ICD-10-CM | POA: Diagnosis not present

## 2021-09-10 MED ORDER — ALPRAZOLAM 0.5 MG PO TABS: 0.5000 mg | ORAL_TABLET | Freq: Two times a day (BID) | ORAL | 3 refills | Status: DC | PRN

## 2021-09-10 MED ORDER — DONEPEZIL HCL 5 MG PO TABS: 5.0000 mg | ORAL_TABLET | Freq: Every day | ORAL | 3 refills | Status: DC

## 2021-09-10 NOTE — Patient Instructions (Signed)
Use Android tags to recover keys, purse etc

## 2021-09-10 NOTE — Progress Notes (Signed)
Subjective:  Patient ID: Tiffany Gibbs, female    DOB: [birth year]  Age: 76 y.o. MRN: [id number]  CC: Follow-up   HPI Neely Kammerer presents for anxiety, depression, stress C/o being forgetful. Pt packed camper, lost her keys. On the way to [place], [place] - lost her shoulder bag - she did not find it. C/o anxiety, restlessness  Outpatient Medications Prior to Visit  Medication Sig Dispense Refill   Blood Glucose Monitoring Suppl (ONETOUCH VERIO) w/Device KIT Use to check blood sugar daily. DX E11.9 1 kit 0   Cyanocobalamin (B-12) 5000 MCG CAPS Take 1 tablet by mouth once a week.      diphenhydrAMINE (BENADRYL) 25 mg capsule Take 25 mg by mouth daily as needed (allergies).      FLUoxetine (PROZAC) 20 MG tablet TAKE 1 TO 2 TABLETS BY MOUTH ONCE DAILY 60 tablet 5   fluticasone (FLONASE) 50 MCG/ACT nasal spray Place 2 sprays into both nostrils daily. 16 g 6   glucose blood (ONETOUCH VERIO) test strip USE 1 STRIP TO CHECK GLUCOSE TWICE DAILY AS NEEDED 200 each 1   loratadine (CLARITIN) 10 MG tablet Take 1 tablet (10 mg total) by mouth daily. 30 tablet 11   magnesium oxide (MAG-OX) 400 MG tablet Take 400 mg by mouth daily.     metFORMIN (GLUCOPHAGE) 500 MG tablet TAKE 1 TABLET BY MOUTH TWICE DAILY WITH MEALS 180 tablet 1   Omega-3 Fatty Acids (FISH OIL) 1200 MG CAPS Take 1 capsule by mouth in the morning and at bedtime.     OneTouch Delica Lancets 25Z MISC USE 1  TO CHECK GLUCOSE IN THE MORNING AND 1 AT BEDTIME 200 each 1   potassium chloride (KLOR-CON) 8 MEQ tablet Take 1 tablet by mouth twice daily 180 tablet 1   Turmeric Curcumin 500 MG CAPS Take 1 capsule in the morning and 2 at bedtime     No facility-administered medications prior to visit.    ROS: Review of Systems  Constitutional:  Negative for activity change, appetite change, chills, fatigue and unexpected weight change.  HENT:  Negative for congestion, mouth sores and sinus pressure.   Eyes:  Negative for  visual disturbance.  Respiratory:  Negative for cough and chest tightness.   Gastrointestinal:  Negative for abdominal pain and nausea.  Genitourinary:  Negative for difficulty urinating, frequency and vaginal pain.  Musculoskeletal:  Negative for back pain and gait problem.  Skin:  Negative for pallor and rash.  Neurological:  Negative for dizziness, tremors, weakness, numbness and headaches.  Psychiatric/Behavioral:  Positive for agitation, decreased concentration and sleep disturbance. Negative for confusion and suicidal ideas. The patient is nervous/anxious and is hyperactive.     Objective:  BP 132/62 (BP Location: Left Arm)   Pulse 66   Temp 98.1 F (36.7 C) (Oral)   Ht 5' 2"  (1.575 m)   Wt 149 lb 6.4 oz (67.8 kg)   SpO2 98%   BMI 27.33 kg/m   BP Readings from Last 3 Encounters:  09/10/21 132/62  07/28/21 122/67  07/02/21 120/78    Wt Readings from Last 3 Encounters:  09/10/21 149 lb 6.4 oz (67.8 kg)  07/28/21 150 lb (68 kg)  07/02/21 147 lb (66.7 kg)    Physical Exam Constitutional:      General: She is not in acute distress.    Appearance: Normal appearance. She is well-developed.  HENT:     Head: Normocephalic.     Right Ear: External ear normal.  Left Ear: External ear normal.     Nose: Nose normal.  Eyes:     General:        Right eye: No discharge.        Left eye: No discharge.     Conjunctiva/sclera: Conjunctivae normal.     Pupils: Pupils are equal, round, and reactive to light.  Neck:     Thyroid: No thyromegaly.     Vascular: No JVD.     Trachea: No tracheal deviation.  Cardiovascular:     Rate and Rhythm: Normal rate and regular rhythm.     Heart sounds: Normal heart sounds.  Pulmonary:     Effort: No respiratory distress.     Breath sounds: No stridor. No wheezing.  Abdominal:     General: Bowel sounds are normal. There is no distension.     Palpations: Abdomen is soft. There is no mass.     Tenderness: There is no abdominal  tenderness. There is no guarding or rebound.  Musculoskeletal:        General: No tenderness.     Cervical back: Normal range of motion and neck supple. No rigidity.  Lymphadenopathy:     Cervical: No cervical adenopathy.  Skin:    Findings: No erythema or rash.  Neurological:     Cranial Nerves: No cranial nerve deficit.     Motor: No abnormal muscle tone.     Coordination: Coordination normal.     Gait: Gait normal.     Deep Tendon Reflexes: Reflexes normal.  Psychiatric:        Thought Content: Thought content normal.        Judgment: Judgment normal.   Upset A/o/c     A total time of 45 minutes was spent preparing to see the patient, reviewing tests, x-rays, operative reports and other ER medical records.  Also, obtaining history and performing comprehensive physical exam.  Additionally, counseling the patient regarding the above listed issues.   Finally, documenting clinical information in the health records, coordination of care, educating the patient re stress, insomnia, memory problems.   Lab Results  Component Value Date   WBC 4.9 04/02/2021   HGB 13.5 04/02/2021   HCT 39.9 04/02/2021   PLT 221.0 04/02/2021   GLUCOSE 94 06/26/2021   CHOL 191 12/26/2020   TRIG 70.0 12/26/2020   HDL 74.80 12/26/2020   LDLDIRECT 148.2 02/03/2013   LDLCALC 103 (H) 12/26/2020   ALT 10 06/26/2021   AST 14 06/26/2021   NA 142 06/26/2021   K 4.0 06/26/2021   CL 107 06/26/2021   CREATININE 0.96 06/26/2021   BUN 17 06/26/2021   CO2 26 06/26/2021   TSH 1.41 04/02/2021   INR 1.0 10/28/2018   HGBA1C 5.5 06/26/2021   MICROALBUR 6.0 (H) 06/26/2021    VAS [place] LOWER EXT ART SEG MULTI (SEGMENTALS & LE RAYNAUDS)  Result Date: 08/15/2021  LOWER EXTREMITY DOPPLER STUDY Patient Name:  Tula Schryver  Date of Exam:   08/15/2021 Medical Rec #: [id number]               Accession #:    [id number] Date of Birth: 01-Mar-1945               Patient Gender: F Patient Age:   30 years Exam Location:   [facility] Procedure:      VAS [place] LOWER EXT ART SEG MULTI (SEGMENTALS & LE RAYNAUDS) Referring Phys: Tyrone Apple Kamri Gotsch --------------------------------------------------------------------------------  Indications: Patient presents with complaints of numbness  of the bilateral feet              and up into the right calf, feelings of coldness in the feet and              leg cramping at night. High Risk Factors: Hypertension, Diabetes, past history of smoking.  Performing Technologist: Mariane Masters RVT  Examination Guidelines: A complete evaluation includes at minimum, Doppler waveform signals and systolic blood pressure reading at the level of bilateral brachial, anterior tibial, and posterior tibial arteries, when vessel segments are accessible. Bilateral testing is considered an integral part of a complete examination. Photoelectric Plethysmograph (PPG) waveforms and toe systolic pressure readings are included as required and additional duplex testing as needed. Limited examinations for reoccurring indications may be performed as noted.  ABI Findings: +---------+------------------+-----+---------+--------+ Right    Rt Pressure (mmHg)IndexWaveform Comment  +---------+------------------+-----+---------+--------+ Brachial 154                                      +---------+------------------+-----+---------+--------+ CFA                             triphasic         +---------+------------------+-----+---------+--------+ Popliteal                       triphasic         +---------+------------------+-----+---------+--------+ PTA      180               1.17 triphasic         +---------+------------------+-----+---------+--------+ PERO                            biphasic          +---------+------------------+-----+---------+--------+ DP       174               1.13 triphasic         +---------+------------------+-----+---------+--------+ Great Toe116               0.75  Normal            +---------+------------------+-----+---------+--------+ +---------+------------------+-----+---------+-------+ Left     Lt Pressure (mmHg)IndexWaveform Comment +---------+------------------+-----+---------+-------+ Brachial 152                                     +---------+------------------+-----+---------+-------+ CFA                             triphasic        +---------+------------------+-----+---------+-------+ Popliteal                       triphasic        +---------+------------------+-----+---------+-------+ PTA      180               1.17 triphasic        +---------+------------------+-----+---------+-------+ PERO     157               1.02 triphasic        +---------+------------------+-----+---------+-------+ DP       178  1.16 biphasic         +---------+------------------+-----+---------+-------+ Great Toe132               0.86 Normal           +---------+------------------+-----+---------+-------+ +-------+-----------+-----------+------------+------------+ ABI/TBIToday's ABIToday's TBIPrevious ABIPrevious TBI +-------+-----------+-----------+------------+------------+ Right  1.17       0.75                                +-------+-----------+-----------+------------+------------+ Left   1.1        0.86                                +-------+-----------+-----------+------------+------------+  Summary: Right: Resting right ankle-brachial index is within normal range. No evidence of significant right lower extremity arterial disease. The right toe-brachial index is normal. Left: Resting left ankle-brachial index is within normal range. No evidence of significant left lower extremity arterial disease. The left toe-brachial index is normal. *See table(s) above for measurements and observations.  Electronically signed by Larae Grooms MD on 08/15/2021 at 10:00:05 PM.    Final     Assessment & Plan:    Problem List Items Addressed This Visit     B12 deficiency    Continue on vitamin B12      Insomnia disorder    Alprazolam as needed  Potential benefits of a long term benzodiazepines  use as well as potential risks  and complications were explained to the patient and were aknowledged.       Memory difficulty    Worse We discussed our options, ie Aricept -prescription provided Use Android tags to recover keys, etc Treat insomnia and anxiety          Meds ordered this encounter  Medications   ALPRAZolam (XANAX) 0.5 MG tablet    Sig: Take 1-2 tablets (0.5-1 mg total) by mouth 2 (two) times daily as needed for anxiety. Insomnia    Dispense:  60 tablet    Refill:  3   donepezil (ARICEPT) 5 MG tablet    Sig: Take 1 tablet (5 mg total) by mouth at bedtime.    Dispense:  90 tablet    Refill:  3      Follow-up: Return in about 4 weeks (around 10/08/2021) for a follow-up visit.  Walker Kehr, MD

## 2021-09-10 NOTE — Assessment & Plan Note (Addendum)
Worse We discussed our options, ie Aricept -prescription provided Use Android tags to recover keys, etc Treat insomnia and anxiety

## 2021-09-16 NOTE — Assessment & Plan Note (Signed)
Alprazolam as needed  Potential benefits of a long term benzodiazepines  use as well as potential risks  and complications were explained to the patient and were aknowledged.

## 2021-09-16 NOTE — Assessment & Plan Note (Signed)
Continue on vitamin B12 

## 2021-09-17 DIAGNOSIS — H35363 Drusen (degenerative) of macula, bilateral: Secondary | ICD-10-CM | POA: Diagnosis not present

## 2021-09-25 ENCOUNTER — Other Ambulatory Visit: Payer: Self-pay | Admitting: Internal Medicine

## 2021-09-25 DIAGNOSIS — I8002 Phlebitis and thrombophlebitis of superficial vessels of left lower extremity: Secondary | ICD-10-CM

## 2021-09-25 DIAGNOSIS — I10 Essential (primary) hypertension: Secondary | ICD-10-CM

## 2021-09-25 DIAGNOSIS — E119 Type 2 diabetes mellitus without complications: Secondary | ICD-10-CM

## 2021-09-25 DIAGNOSIS — E538 Deficiency of other specified B group vitamins: Secondary | ICD-10-CM

## 2021-09-25 DIAGNOSIS — R413 Other amnesia: Secondary | ICD-10-CM

## 2021-09-25 DIAGNOSIS — J4520 Mild intermittent asthma, uncomplicated: Secondary | ICD-10-CM

## 2021-09-25 DIAGNOSIS — Z0000 Encounter for general adult medical examination without abnormal findings: Secondary | ICD-10-CM

## 2021-09-25 DIAGNOSIS — F411 Generalized anxiety disorder: Secondary | ICD-10-CM

## 2021-10-06 ENCOUNTER — Ambulatory Visit (INDEPENDENT_AMBULATORY_CARE_PROVIDER_SITE_OTHER): Payer: Medicare HMO | Admitting: Internal Medicine

## 2021-10-06 ENCOUNTER — Encounter: Payer: Self-pay | Admitting: Internal Medicine

## 2021-10-06 VITALS — BP 132/78 | HR 59 | Temp 98.2°F | Ht 62.0 in | Wt 150.2 lb

## 2021-10-06 DIAGNOSIS — R69 Illness, unspecified: Secondary | ICD-10-CM | POA: Diagnosis not present

## 2021-10-06 DIAGNOSIS — G4709 Other insomnia: Secondary | ICD-10-CM | POA: Diagnosis not present

## 2021-10-06 DIAGNOSIS — R413 Other amnesia: Secondary | ICD-10-CM

## 2021-10-06 DIAGNOSIS — Z23 Encounter for immunization: Secondary | ICD-10-CM | POA: Diagnosis not present

## 2021-10-06 DIAGNOSIS — F419 Anxiety disorder, unspecified: Secondary | ICD-10-CM | POA: Diagnosis not present

## 2021-10-06 DIAGNOSIS — N183 Chronic kidney disease, stage 3 unspecified: Secondary | ICD-10-CM

## 2021-10-06 NOTE — Assessment & Plan Note (Signed)
C/o memory issues and insomnia- sleeping better. No h/o bipolar disorder.

## 2021-10-06 NOTE — Assessment & Plan Note (Addendum)
Alprazolam as needed  Potential benefits of a long term benzodiazepines  use as well as potential risks  and complications were explained to the patient and were aknowledged Sleeping better

## 2021-10-06 NOTE — Assessment & Plan Note (Signed)
Cont w/Aricept Use Android tags to recover keys, etc Cont to treat insomnia and anxiety

## 2021-10-06 NOTE — Assessment & Plan Note (Signed)
F/u w/Dr Marval Regal

## 2021-10-06 NOTE — Progress Notes (Signed)
Subjective:  Patient ID: Tiffany Gibbs, female    DOB: April 04, 1945  Age: 76 y.o. MRN: [id number]  CC: Follow-up (3 month f/u- Flu shot)   HPI Tiffany Gibbs presents for anxiety, restlessness, stress  Outpatient Medications Prior to Visit  Medication Sig Dispense Refill   ALPRAZolam (XANAX) 0.5 MG tablet Take 1-2 tablets (0.5-1 mg total) by mouth 2 (two) times daily as needed for anxiety. Insomnia 60 tablet 3   Blood Glucose Monitoring Suppl (ONETOUCH VERIO) w/Device KIT Use to check blood sugar daily. DX E11.9 1 kit 0   Cyanocobalamin (B-12) 5000 MCG CAPS Take 1 tablet by mouth once a week.      diphenhydrAMINE (BENADRYL) 25 mg capsule Take 25 mg by mouth daily as needed (allergies).      donepezil (ARICEPT) 5 MG tablet Take 1 tablet (5 mg total) by mouth at bedtime. 90 tablet 3   FLUoxetine (PROZAC) 20 MG tablet TAKE 1 TO 2 TABLETS BY MOUTH ONCE DAILY 60 tablet 5   fluticasone (FLONASE) 50 MCG/ACT nasal spray Place 2 sprays into both nostrils daily. 16 g 6   glucose blood (ONETOUCH VERIO) test strip USE 1 STRIP TO CHECK GLUCOSE TWICE DAILY AS NEEDED 200 each 1   loratadine (CLARITIN) 10 MG tablet Take 1 tablet (10 mg total) by mouth daily. 30 tablet 11   magnesium oxide (MAG-OX) 400 MG tablet Take 400 mg by mouth daily.     metFORMIN (GLUCOPHAGE) 500 MG tablet TAKE 1 TABLET BY MOUTH TWICE DAILY WITH MEALS 180 tablet 1   Omega-3 Fatty Acids (FISH OIL) 1200 MG CAPS Take 1 capsule by mouth in the morning and at bedtime.     OneTouch Delica Lancets 49Q MISC USE 1  TO CHECK GLUCOSE IN THE MORNING AND 1 AT BEDTIME 200 each 1   potassium chloride (KLOR-CON) 8 MEQ tablet Take 1 tablet by mouth twice daily 180 tablet 1   Turmeric Curcumin 500 MG CAPS Take 1 capsule in the morning and 2 at bedtime     No facility-administered medications prior to visit.    ROS: Review of Systems  Constitutional:  Positive for fatigue. Negative for activity change, appetite change, chills and  unexpected weight change.  HENT:  Negative for congestion, mouth sores and sinus pressure.   Eyes:  Negative for visual disturbance.  Respiratory:  Negative for cough and chest tightness.   Gastrointestinal:  Negative for abdominal pain and nausea.  Genitourinary:  Negative for difficulty urinating, frequency and vaginal pain.  Musculoskeletal:  Negative for back pain and gait problem.  Skin:  Negative for pallor and rash.  Neurological:  Negative for dizziness, tremors, weakness, numbness and headaches.  Psychiatric/Behavioral:  Positive for decreased concentration and dysphoric mood. Negative for confusion, self-injury, sleep disturbance and suicidal ideas. The patient is nervous/anxious.     Objective:  BP 132/78 (BP Location: Left Arm)   Pulse (!) 59   Temp 98.2 F (36.8 C) (Oral)   Ht $R'5\' 2"'vH$  (1.575 m)   Wt 150 lb 3.2 oz (68.1 kg)   SpO2 98%   BMI 27.47 kg/m   BP Readings from Last 3 Encounters:  10/06/21 132/78  09/10/21 132/62  07/28/21 122/67    Wt Readings from Last 3 Encounters:  10/06/21 150 lb 3.2 oz (68.1 kg)  09/10/21 149 lb 6.4 oz (67.8 kg)  07/28/21 150 lb (68 kg)    Physical Exam Constitutional:      General: She is not in acute distress.  Appearance: She is well-developed.  HENT:     Head: Normocephalic.     Right Ear: External ear normal.     Left Ear: External ear normal.     Nose: Nose normal.  Eyes:     General:        Right eye: No discharge.        Left eye: No discharge.     Conjunctiva/sclera: Conjunctivae normal.     Pupils: Pupils are equal, round, and reactive to light.  Neck:     Thyroid: No thyromegaly.     Vascular: No JVD.     Trachea: No tracheal deviation.  Cardiovascular:     Rate and Rhythm: Normal rate and regular rhythm.     Heart sounds: Normal heart sounds.  Pulmonary:     Effort: No respiratory distress.     Breath sounds: No stridor. No wheezing.  Abdominal:     General: Bowel sounds are normal. There is no  distension.     Palpations: Abdomen is soft. There is no mass.     Tenderness: There is no abdominal tenderness. There is no guarding or rebound.  Musculoskeletal:        General: No tenderness.     Cervical back: Normal range of motion and neck supple. No rigidity.  Lymphadenopathy:     Cervical: No cervical adenopathy.  Skin:    Findings: No erythema or rash.  Neurological:     Cranial Nerves: No cranial nerve deficit.     Motor: No abnormal muscle tone.     Coordination: Coordination normal.     Deep Tendon Reflexes: Reflexes normal.  Psychiatric:        Behavior: Behavior normal.        Thought Content: Thought content normal.        Judgment: Judgment normal.     Lab Results  Component Value Date   WBC 4.9 04/02/2021   HGB 13.5 04/02/2021   HCT 39.9 04/02/2021   PLT 221.0 04/02/2021   GLUCOSE 94 06/26/2021   CHOL 191 12/26/2020   TRIG 70.0 12/26/2020   HDL 74.80 12/26/2020   LDLDIRECT 148.2 02/03/2013   LDLCALC 103 (H) 12/26/2020   ALT 10 06/26/2021   AST 14 06/26/2021   NA 142 06/26/2021   K 4.0 06/26/2021   CL 107 06/26/2021   CREATININE 0.96 06/26/2021   BUN 17 06/26/2021   CO2 26 06/26/2021   TSH 1.41 04/02/2021   INR 1.0 10/28/2018   HGBA1C 5.5 06/26/2021   MICROALBUR 6.0 (H) 06/26/2021    VAS [place] LOWER EXT ART SEG MULTI (SEGMENTALS & LE RAYNAUDS)  Result Date: 08/15/2021  LOWER EXTREMITY DOPPLER STUDY Patient Name:  Tiffany Gibbs  Date of Exam:   08/15/2021 Medical Rec #: [id number]               Accession #:    [id number] Date of Birth: 01/29/1945               Patient Gender: F Patient Age:   44 years Exam Location:  [facility] Procedure:      VAS [place] LOWER EXT ART SEG MULTI (SEGMENTALS & LE RAYNAUDS) Referring Phys: Tyrone Apple Avalene Sealy --------------------------------------------------------------------------------  Indications: Patient presents with complaints of numbness of the bilateral feet              and up into the right calf, feelings of  coldness in the feet and  leg cramping at night. High Risk Factors: Hypertension, Diabetes, past history of smoking.  Performing Technologist: Mariane Masters RVT  Examination Guidelines: A complete evaluation includes at minimum, Doppler waveform signals and systolic blood pressure reading at the level of bilateral brachial, anterior tibial, and posterior tibial arteries, when vessel segments are accessible. Bilateral testing is considered an integral part of a complete examination. Photoelectric Plethysmograph (PPG) waveforms and toe systolic pressure readings are included as required and additional duplex testing as needed. Limited examinations for reoccurring indications may be performed as noted.  ABI Findings: +---------+------------------+-----+---------+--------+ Right    Rt Pressure (mmHg)IndexWaveform Comment  +---------+------------------+-----+---------+--------+ Brachial 154                                      +---------+------------------+-----+---------+--------+ CFA                             triphasic         +---------+------------------+-----+---------+--------+ Popliteal                       triphasic         +---------+------------------+-----+---------+--------+ PTA      180               1.17 triphasic         +---------+------------------+-----+---------+--------+ PERO                            biphasic          +---------+------------------+-----+---------+--------+ DP       174               1.13 triphasic         +---------+------------------+-----+---------+--------+ Great Toe116               0.75 Normal            +---------+------------------+-----+---------+--------+ +---------+------------------+-----+---------+-------+ Left     Lt Pressure (mmHg)IndexWaveform Comment +---------+------------------+-----+---------+-------+ Brachial 152                                      +---------+------------------+-----+---------+-------+ CFA                             triphasic        +---------+------------------+-----+---------+-------+ Popliteal                       triphasic        +---------+------------------+-----+---------+-------+ PTA      180               1.17 triphasic        +---------+------------------+-----+---------+-------+ PERO     157               1.02 triphasic        +---------+------------------+-----+---------+-------+ DP       178               1.16 biphasic         +---------+------------------+-----+---------+-------+ Great Toe132               0.86 Normal           +---------+------------------+-----+---------+-------+ +-------+-----------+-----------+------------+------------+ ABI/TBIToday's ABIToday's  TBIPrevious ABIPrevious TBI +-------+-----------+-----------+------------+------------+ Right  1.17       0.75                                +-------+-----------+-----------+------------+------------+ Left   1.1        0.86                                +-------+-----------+-----------+------------+------------+  Summary: Right: Resting right ankle-brachial index is within normal range. No evidence of significant right lower extremity arterial disease. The right toe-brachial index is normal. Left: Resting left ankle-brachial index is within normal range. No evidence of significant left lower extremity arterial disease. The left toe-brachial index is normal. *See table(s) above for measurements and observations.  Electronically signed by Larae Grooms MD on 08/15/2021 at 10:00:05 PM.    Final     Assessment & Plan:   Problem List Items Addressed This Visit     Anxiety disorder    C/o memory issues and insomnia- sleeping better. No h/o bipolar disorder.      CRI (chronic renal insufficiency), stage 3 (moderate) (HCC)    F/u w/Dr [place]ato       Insomnia disorder    Alprazolam as needed   Potential benefits of a long term benzodiazepines  use as well as potential risks  and complications were explained to the patient and were aknowledged Sleeping better      Memory difficulty     Cont w/Aricept Use Android tags to recover keys, etc Cont to treat insomnia and anxiety      Other Visit Diagnoses     Needs flu shot    -  Primary   Relevant Orders   Flu Vaccine QUAD High Dose(Fluad) (Completed)         No orders of the defined types were placed in this encounter.     Follow-up: Return in about 6 weeks (around 11/17/2021) for a follow-up visit.  Walker Kehr, MD

## 2021-10-14 ENCOUNTER — Telehealth: Payer: Self-pay | Admitting: Internal Medicine

## 2021-11-05 ENCOUNTER — Telehealth: Payer: Self-pay | Admitting: Internal Medicine

## 2021-11-05 NOTE — Telephone Encounter (Signed)
Patient would like to know your thoughts on her getting the covid shot and the rsv shot - She would like for you to call her and let her know if she should get these.

## 2021-11-06 NOTE — Telephone Encounter (Signed)
Notified pt w/MD response.../lmb 

## 2021-11-06 NOTE — Telephone Encounter (Signed)
I would not get a COVID shot.  We can decide on RSV shot later.  Thanks

## 2021-11-12 ENCOUNTER — Encounter: Payer: Self-pay | Admitting: Pharmacist

## 2021-11-12 NOTE — Progress Notes (Signed)
[facility])                                            [place] Team                                        Statin Quality Measure Assessment    11/12/2021  Tiffany Gibbs [facility] 11-20-1945 [id number]  Dr. Alain Marion,   I am a [facility] clinical pharmacist that reviews patients for statin quality initiatives.     Per review of chart and payor information, Tiffany Gibbs has a diagnosis of diabetes but is not currently filling a statin prescription.  This places her into the SUPD (Statin Use In Patients with Diabetes) measure for CMS.    Patient has documented trials of multiple statins with reported myalgias, aches and nausea, but no corresponding CPT codes that would exclude patient from SUPD measure.  Last year she was coded G72.0 (statin myopathy), this is required annually.  If clinically appropriate, please consider evaluating her for past statin intolerance at her office visit tomorrow.  Code for past statin intolerance or  other exclusions (required annually)  Provider Requirements: Associate code during an office visit or telehealth encounter  Drug Induced Myopathy G72.0   Myopathy, unspecified G72.9   Myositis, unspecified M60.9   Rhabdomyolysis M42.68   Alcoholic fatty liver T41.9   Cirrhosis of liver K74.69   Prediabetes R73.03    Thank you for your time and consideration! Curlene Labrum, PharmD [place] Pharmacist Office: [phone]

## 2021-11-13 ENCOUNTER — Telehealth: Payer: Self-pay | Admitting: Internal Medicine

## 2021-11-13 ENCOUNTER — Encounter: Payer: Self-pay | Admitting: Internal Medicine

## 2021-11-13 ENCOUNTER — Ambulatory Visit (INDEPENDENT_AMBULATORY_CARE_PROVIDER_SITE_OTHER): Payer: Medicare HMO | Admitting: Internal Medicine

## 2021-11-13 VITALS — BP 128/68 | HR 63 | Temp 98.1°F | Ht 62.0 in | Wt 146.0 lb

## 2021-11-13 DIAGNOSIS — F419 Anxiety disorder, unspecified: Secondary | ICD-10-CM | POA: Diagnosis not present

## 2021-11-13 DIAGNOSIS — E119 Type 2 diabetes mellitus without complications: Secondary | ICD-10-CM

## 2021-11-13 DIAGNOSIS — R413 Other amnesia: Secondary | ICD-10-CM | POA: Diagnosis not present

## 2021-11-13 DIAGNOSIS — G4709 Other insomnia: Secondary | ICD-10-CM

## 2021-11-13 DIAGNOSIS — N183 Chronic kidney disease, stage 3 unspecified: Secondary | ICD-10-CM | POA: Diagnosis not present

## 2021-11-13 DIAGNOSIS — R69 Illness, unspecified: Secondary | ICD-10-CM | POA: Diagnosis not present

## 2021-11-13 LAB — COMPREHENSIVE METABOLIC PANEL
ALT: 9 U/L (ref 0–35)
AST: 14 U/L (ref 0–37)
Albumin: 4.4 g/dL (ref 3.5–5.2)
Alkaline Phosphatase: 52 U/L (ref 39–117)
BUN: 17 mg/dL (ref 6–23)
CO2: 28 mEq/L (ref 19–32)
Calcium: 9.4 mg/dL (ref 8.4–10.5)
Chloride: 104 mEq/L (ref 96–112)
Creatinine, Ser: 1 mg/dL (ref 0.40–1.20)
GFR: 54.78 mL/min — ABNORMAL LOW (ref >60.00)
Glucose, Bld: 88 mg/dL (ref 70–99)
Potassium: 4 mEq/L (ref 3.5–5.1)
Sodium: 140 mEq/L (ref 135–145)
Total Bilirubin: 0.6 mg/dL (ref 0.2–1.2)
Total Protein: 7.3 g/dL (ref 6.0–8.3)

## 2021-11-13 LAB — HEMOGLOBIN A1C: Hgb A1c MFr Bld: 5.7 % (ref 4.6–6.5)

## 2021-11-13 MED ORDER — ESCITALOPRAM OXALATE 5 MG PO TABS: 5.0000 mg | ORAL_TABLET | Freq: Every day | ORAL | 5 refills | Status: DC

## 2021-11-13 MED ORDER — MEMANTINE HCL 5 MG PO TABS: 5.0000 mg | ORAL_TABLET | Freq: Two times a day (BID) | ORAL | 5 refills | Status: DC

## 2021-11-13 NOTE — Progress Notes (Signed)
Subjective:  Patient ID: Tiffany Gibbs, female    DOB: 1945/04/27  Age: 76 y.o. MRN: [id number]  CC: Follow-up (6 week f/u)   HPI Tiffany Gibbs presents for cramps from Aricept, memory loss, depression, CRI  Outpatient Medications Prior to Visit  Medication Sig Dispense Refill   ALPRAZolam (XANAX) 0.5 MG tablet Take 1-2 tablets (0.5-1 mg total) by mouth 2 (two) times daily as needed for anxiety. Insomnia 60 tablet 3   Blood Glucose Monitoring Suppl (ONETOUCH VERIO) w/Device KIT Use to check blood sugar daily. DX E11.9 1 kit 0   Cyanocobalamin (B-12) 5000 MCG CAPS Take 1 tablet by mouth once a week.      diphenhydrAMINE (BENADRYL) 25 mg capsule Take 25 mg by mouth daily as needed (allergies).      fluticasone (FLONASE) 50 MCG/ACT nasal spray Place 2 sprays into both nostrils daily. 16 g 6   glucose blood (ONETOUCH VERIO) test strip USE 1 STRIP TO CHECK GLUCOSE TWICE DAILY AS NEEDED 200 each 1   loratadine (CLARITIN) 10 MG tablet Take 1 tablet (10 mg total) by mouth daily. 30 tablet 11   magnesium oxide (MAG-OX) 400 MG tablet Take 400 mg by mouth daily.     metFORMIN (GLUCOPHAGE) 500 MG tablet TAKE 1 TABLET BY MOUTH TWICE DAILY WITH MEALS 180 tablet 1   Omega-3 Fatty Acids (FISH OIL) 1200 MG CAPS Take 1 capsule by mouth in the morning and at bedtime.     OneTouch Delica Lancets 40J MISC USE 1  TO CHECK GLUCOSE IN THE MORNING AND 1 AT BEDTIME 200 each 1   potassium chloride (KLOR-CON) 8 MEQ tablet Take 1 tablet by mouth twice daily 180 tablet 1   Turmeric Curcumin 500 MG CAPS Take 1 capsule in the morning and 2 at bedtime     FLUoxetine (PROZAC) 20 MG tablet TAKE 1 TO 2 TABLETS BY MOUTH ONCE DAILY 60 tablet 5   donepezil (ARICEPT) 5 MG tablet Take 1 tablet (5 mg total) by mouth at bedtime. (Patient not taking: Reported on 11/13/2021) 90 tablet 3   No facility-administered medications prior to visit.    ROS: Review of Systems  Constitutional:  Positive for unexpected  weight change. Negative for activity change, appetite change, chills and fatigue.  HENT:  Negative for congestion, mouth sores and sinus pressure.   Eyes:  Negative for visual disturbance.  Respiratory:  Negative for cough and chest tightness.   Gastrointestinal:  Negative for abdominal pain and nausea.  Genitourinary:  Negative for difficulty urinating, frequency and vaginal pain.  Musculoskeletal:  Negative for back pain and gait problem.  Skin:  Negative for pallor and rash.  Neurological:  Negative for dizziness, tremors, weakness, numbness and headaches.  Psychiatric/Behavioral:  Positive for decreased concentration and sleep disturbance. Negative for confusion and suicidal ideas. The patient is nervous/anxious.     Objective:  BP 128/68 (BP Location: Left Arm)   Pulse 63   Temp 98.1 F (36.7 C) (Oral)   Ht _0  (1.575 m)   Wt 146 lb (66.2 kg)   SpO2 98%   BMI 26.70 kg/m   BP Readings from Last 3 Encounters:  11/13/21 128/68  10/06/21 132/78  09/10/21 132/62    Wt Readings from Last 3 Encounters:  11/13/21 146 lb (66.2 kg)  10/06/21 150 lb 3.2 oz (68.1 kg)  09/10/21 149 lb 6.4 oz (67.8 kg)    Physical Exam Constitutional:      General: She is not in  acute distress.    Appearance: Normal appearance. She is well-developed.  HENT:     Head: Normocephalic.     Right Ear: External ear normal.     Left Ear: External ear normal.     Nose: Nose normal.  Eyes:     General:        Right eye: No discharge.        Left eye: No discharge.     Conjunctiva/sclera: Conjunctivae normal.     Pupils: Pupils are equal, round, and reactive to light.  Neck:     Thyroid: No thyromegaly.     Vascular: No JVD.     Trachea: No tracheal deviation.  Cardiovascular:     Rate and Rhythm: Normal rate and regular rhythm.     Heart sounds: Normal heart sounds.  Pulmonary:     Effort: No respiratory distress.     Breath sounds: No stridor. No wheezing.  Abdominal:     General: Bowel  sounds are normal. There is no distension.     Palpations: Abdomen is soft. There is no mass.     Tenderness: There is no abdominal tenderness. There is no guarding or rebound.  Musculoskeletal:        General: No tenderness.     Cervical back: Normal range of motion and neck supple. No rigidity.  Lymphadenopathy:     Cervical: No cervical adenopathy.  Skin:    Findings: No erythema or rash.  Neurological:     Cranial Nerves: No cranial nerve deficit.     Motor: No abnormal muscle tone.     Coordination: Coordination normal.     Deep Tendon Reflexes: Reflexes normal.  Psychiatric:        Behavior: Behavior normal.        Thought Content: Thought content normal.        Judgment: Judgment normal.     Lab Results  Component Value Date   WBC 4.9 04/02/2021   HGB 13.5 04/02/2021   HCT 39.9 04/02/2021   PLT 221.0 04/02/2021   GLUCOSE 94 06/26/2021   CHOL 191 12/26/2020   TRIG 70.0 12/26/2020   HDL 74.80 12/26/2020   LDLDIRECT 148.2 02/03/2013   LDLCALC 103 (H) 12/26/2020   ALT 10 06/26/2021   AST 14 06/26/2021   NA 142 06/26/2021   K 4.0 06/26/2021   CL 107 06/26/2021   CREATININE 0.96 06/26/2021   BUN 17 06/26/2021   CO2 26 06/26/2021   TSH 1.41 04/02/2021   INR 1.0 10/28/2018   HGBA1C 5.5 06/26/2021   MICROALBUR 6.0 (H) 06/26/2021    VAS [place] LOWER EXT ART SEG MULTI (SEGMENTALS & LE RAYNAUDS)  Result Date: 08/15/2021  LOWER EXTREMITY DOPPLER STUDY Patient Name:  Tiffany Gibbs  Date of Exam:   08/15/2021 Medical Rec #: [id number]               Accession #:    [id number] Date of Birth: December 04, 1945               Patient Gender: F Patient Age:   69 years Exam Location:  [facility] Procedure:      VAS [place] LOWER EXT ART SEG MULTI (SEGMENTALS & LE RAYNAUDS) Referring Phys: Tyrone Apple Myli Pae --------------------------------------------------------------------------------  Indications: Patient presents with complaints of numbness of the bilateral feet              and up into the  right calf, feelings of coldness in the feet and  leg cramping at night. High Risk Factors: Hypertension, Diabetes, past history of smoking.  Performing Technologist: Mariane Masters RVT  Examination Guidelines: A complete evaluation includes at minimum, Doppler waveform signals and systolic blood pressure reading at the level of bilateral brachial, anterior tibial, and posterior tibial arteries, when vessel segments are accessible. Bilateral testing is considered an integral part of a complete examination. Photoelectric Plethysmograph (PPG) waveforms and toe systolic pressure readings are included as required and additional duplex testing as needed. Limited examinations for reoccurring indications may be performed as noted.  ABI Findings: +---------+------------------+-----+---------+--------+ Right    Rt Pressure (mmHg)IndexWaveform Comment  +---------+------------------+-----+---------+--------+ Brachial 154                                      +---------+------------------+-----+---------+--------+ CFA                             triphasic         +---------+------------------+-----+---------+--------+ Popliteal                       triphasic         +---------+------------------+-----+---------+--------+ PTA      180               1.17 triphasic         +---------+------------------+-----+---------+--------+ PERO                            biphasic          +---------+------------------+-----+---------+--------+ DP       174               1.13 triphasic         +---------+------------------+-----+---------+--------+ Great Toe116               0.75 Normal            +---------+------------------+-----+---------+--------+ +---------+------------------+-----+---------+-------+ Left     Lt Pressure (mmHg)IndexWaveform Comment +---------+------------------+-----+---------+-------+ Brachial 152                                      +---------+------------------+-----+---------+-------+ CFA                             triphasic        +---------+------------------+-----+---------+-------+ Popliteal                       triphasic        +---------+------------------+-----+---------+-------+ PTA      180               1.17 triphasic        +---------+------------------+-----+---------+-------+ PERO     157               1.02 triphasic        +---------+------------------+-----+---------+-------+ DP       178               1.16 biphasic         +---------+------------------+-----+---------+-------+ Great Toe132               0.86 Normal           +---------+------------------+-----+---------+-------+ +-------+-----------+-----------+------------+------------+ ABI/TBIToday's ABIToday's  TBIPrevious ABIPrevious TBI +-------+-----------+-----------+------------+------------+ Right  1.17       0.75                                +-------+-----------+-----------+------------+------------+ Left   1.1        0.86                                +-------+-----------+-----------+------------+------------+  Summary: Right: Resting right ankle-brachial index is within normal range. No evidence of significant right lower extremity arterial disease. The right toe-brachial index is normal. Left: Resting left ankle-brachial index is within normal range. No evidence of significant left lower extremity arterial disease. The left toe-brachial index is normal. *See table(s) above for measurements and observations.  Electronically signed by Larae Grooms MD on 08/15/2021 at 10:00:05 PM.    Final     Assessment & Plan:   Problem List Items Addressed This Visit     Anxiety disorder - Primary    D/c Fluoxetine - eating too much Start Lexapro Use Xanax prn       Relevant Medications   escitalopram (LEXAPRO) 5 MG tablet   CRI (chronic renal insufficiency), stage 3 (moderate) (HCC)    Hydrate well       Insomnia disorder    We will try Namenda Xanax prn  Potential benefits of a long term benzodiazepines  use as well as potential risks  and complications were explained to the patient and were aknowledged.      Memory difficulty    Aricept  -- d/c due to cramps We will try Namenda         Meds ordered this encounter  Medications   escitalopram (LEXAPRO) 5 MG tablet    Sig: Take 1 tablet (5 mg total) by mouth daily.    Dispense:  30 tablet    Refill:  5   memantine (NAMENDA) 5 MG tablet    Sig: Take 1 tablet (5 mg total) by mouth 2 (two) times daily.    Dispense:  60 tablet    Refill:  5      Follow-up: Return in about 2 months (around 01/13/2022) for a follow-up visit.  Walker Kehr, MD

## 2021-11-13 NOTE — Telephone Encounter (Signed)
PT visits today post visit to give an FYI to Dr.[name] and staff. PT had forgotten to go over a reoccurring pressure/tension she had been getting in her chest. She said it had happened after she got her labs done today. She says this is the 4th time it has happened in the last couple of weeks. Describes the sensation as like indigestion. Pressure/tension radiation between center and left side of the chest, only lasting for a second or so.  PT was requesting a call back on if Dr.[name] believed this to be serious or if he was wanting an appointment scheduled.  CB: [phone]

## 2021-11-13 NOTE — Assessment & Plan Note (Signed)
We will try Namenda Xanax prn  Potential benefits of a long term benzodiazepines  use as well as potential risks  and complications were explained to the patient and were aknowledged.

## 2021-11-13 NOTE — Assessment & Plan Note (Signed)
Aricept  -- d/c due to cramps We will try Namenda

## 2021-11-13 NOTE — Assessment & Plan Note (Signed)
D/c Fluoxetine - eating too much Start Lexapro Use Xanax prn

## 2021-11-13 NOTE — Assessment & Plan Note (Signed)
Hydrate well 

## 2021-11-14 ENCOUNTER — Telehealth: Payer: Self-pay | Admitting: Internal Medicine

## 2021-11-14 NOTE — Telephone Encounter (Signed)
Pt requesting LAB RESULTS be mailed to her home:   [place] [phone]

## 2021-11-14 NOTE — Telephone Encounter (Signed)
Spoke w/ pt she had two different msg's.Please inform the patient. Will mail labs...Tiffany Gibbs

## 2021-11-14 NOTE — Telephone Encounter (Signed)
[name], Aleksei V, MD50 minutes ago (3:29 PM)    It was likely related to anxiety.  Her coronary calcium CT scan showed no blockages in the coronary arteries a couple years ago.  Thank you     Called pt was given cell # by husband called pt gave her MD response.Marland KitchenJohny Chess

## 2021-11-14 NOTE — Telephone Encounter (Signed)
It was likely related to anxiety.  Her coronary calcium CT scan showed no blockages in the coronary arteries a couple years ago.  Thank you

## 2021-12-03 ENCOUNTER — Other Ambulatory Visit: Payer: Self-pay | Admitting: Internal Medicine

## 2021-12-08 ENCOUNTER — Other Ambulatory Visit: Payer: Self-pay | Admitting: Internal Medicine

## 2021-12-08 DIAGNOSIS — Z1231 Encounter for screening mammogram for malignant neoplasm of breast: Secondary | ICD-10-CM | POA: Diagnosis not present

## 2021-12-08 LAB — HM MAMMOGRAPHY

## 2021-12-10 ENCOUNTER — Encounter: Payer: Self-pay | Admitting: Internal Medicine

## 2021-12-12 ENCOUNTER — Telehealth: Payer: Self-pay | Admitting: Internal Medicine

## 2021-12-12 NOTE — Telephone Encounter (Signed)
Patient has called wanting to know if she should get the newest shot for the new virus going around. She has heard a lot of stuff via the news and is getting concerned. She would like a callback on her cell, [phone], for how she should proceed.

## 2021-12-16 NOTE — Telephone Encounter (Signed)
Yes RSV and new covid,,/lmb

## 2021-12-16 NOTE — Telephone Encounter (Signed)
Is it the question about RSV vaccine?  Thanks

## 2021-12-17 NOTE — Telephone Encounter (Signed)
COVID - no RSV - no  Thx

## 2021-12-17 NOTE — Telephone Encounter (Signed)
Called pt no answer LMOM (C) w/MD response.Marland KitchenJohny Gibbs

## 2022-01-01 DIAGNOSIS — H524 Presbyopia: Secondary | ICD-10-CM | POA: Diagnosis not present

## 2022-01-01 DIAGNOSIS — H35363 Drusen (degenerative) of macula, bilateral: Secondary | ICD-10-CM | POA: Diagnosis not present

## 2022-01-01 DIAGNOSIS — Z9842 Cataract extraction status, left eye: Secondary | ICD-10-CM | POA: Diagnosis not present

## 2022-01-01 DIAGNOSIS — H5203 Hypermetropia, bilateral: Secondary | ICD-10-CM | POA: Diagnosis not present

## 2022-01-01 DIAGNOSIS — H52223 Regular astigmatism, bilateral: Secondary | ICD-10-CM | POA: Diagnosis not present

## 2022-01-01 DIAGNOSIS — Z794 Long term (current) use of insulin: Secondary | ICD-10-CM | POA: Diagnosis not present

## 2022-01-01 DIAGNOSIS — E119 Type 2 diabetes mellitus without complications: Secondary | ICD-10-CM | POA: Diagnosis not present

## 2022-01-01 DIAGNOSIS — Z7984 Long term (current) use of oral hypoglycemic drugs: Secondary | ICD-10-CM | POA: Diagnosis not present

## 2022-01-01 DIAGNOSIS — H26491 Other secondary cataract, right eye: Secondary | ICD-10-CM | POA: Diagnosis not present

## 2022-01-01 DIAGNOSIS — Z961 Presence of intraocular lens: Secondary | ICD-10-CM | POA: Diagnosis not present

## 2022-01-10 DIAGNOSIS — Z5321 Procedure and treatment not carried out due to patient leaving prior to being seen by health care provider: Secondary | ICD-10-CM | POA: Diagnosis not present

## 2022-01-10 DIAGNOSIS — R519 Headache, unspecified: Secondary | ICD-10-CM | POA: Diagnosis not present

## 2022-01-13 ENCOUNTER — Telehealth: Payer: Self-pay | Admitting: Internal Medicine

## 2022-01-13 NOTE — Telephone Encounter (Signed)
Patient called and states that she has stopped taking her Namenda 5 mg.  Lexapro '5mg'$ , and Xanax 0.5 mg.  - She thinks it is causing her blood pressure to be elevated.  She is not sleeping and she went to the ED with a headache.  Please advise.  Patients number:  [phone]  Cell #  [phone] - use this number.

## 2022-01-14 NOTE — Telephone Encounter (Signed)
Notified pt w/ MD response. Pt states not sure why BP been going up. Inform pt can move appt up and be evaluated for her blood pressure. Pt states she will keep an eye out and if BP still increase will call for appt.Marland KitchenJohny Chess

## 2022-01-14 NOTE — Telephone Encounter (Signed)
None of these meds would cause elevated blood pressure.  I would restart back.  Thank you

## 2022-01-15 ENCOUNTER — Telehealth: Payer: Self-pay | Admitting: Internal Medicine

## 2022-01-15 NOTE — Telephone Encounter (Signed)
Pt wants to know if she should take a vitamin C /rose hip supplement to help with her diabetes or if it would help her in any other way.   Please call pt with advise at:  [phone]

## 2022-01-15 NOTE — Telephone Encounter (Signed)
I do not think she should take it.  I doubt it will help diabetes.  Thank you

## 2022-01-16 NOTE — Telephone Encounter (Signed)
Called pt no answer LMOM w/MD response../lmb 

## 2022-01-20 ENCOUNTER — Ambulatory Visit: Payer: Medicare HMO | Admitting: Internal Medicine

## 2022-01-23 ENCOUNTER — Other Ambulatory Visit: Payer: Self-pay | Admitting: Internal Medicine

## 2022-01-23 DIAGNOSIS — E119 Type 2 diabetes mellitus without complications: Secondary | ICD-10-CM | POA: Diagnosis not present

## 2022-01-23 DIAGNOSIS — M79661 Pain in right lower leg: Secondary | ICD-10-CM | POA: Diagnosis not present

## 2022-02-10 ENCOUNTER — Other Ambulatory Visit: Payer: Self-pay | Admitting: Internal Medicine

## 2022-03-09 ENCOUNTER — Telehealth: Payer: Self-pay | Admitting: Internal Medicine

## 2022-03-09 NOTE — Telephone Encounter (Signed)
Patient has appointment with Dr. Alain Marion tomorrow and is asking for lab orders to be placed so she can get them done first thing in the morning. Call back number is [phone]

## 2022-03-10 ENCOUNTER — Other Ambulatory Visit: Payer: Self-pay | Admitting: Internal Medicine

## 2022-03-10 ENCOUNTER — Ambulatory Visit (INDEPENDENT_AMBULATORY_CARE_PROVIDER_SITE_OTHER): Payer: Medicare HMO | Admitting: Internal Medicine

## 2022-03-10 ENCOUNTER — Encounter: Payer: Self-pay | Admitting: Internal Medicine

## 2022-03-10 VITALS — BP 124/76 | HR 65 | Temp 98.0°F | Ht 62.0 in | Wt 157.0 lb

## 2022-03-10 DIAGNOSIS — R413 Other amnesia: Secondary | ICD-10-CM | POA: Diagnosis not present

## 2022-03-10 DIAGNOSIS — E538 Deficiency of other specified B group vitamins: Secondary | ICD-10-CM | POA: Diagnosis not present

## 2022-03-10 DIAGNOSIS — R252 Cramp and spasm: Secondary | ICD-10-CM

## 2022-03-10 DIAGNOSIS — F4321 Adjustment disorder with depressed mood: Secondary | ICD-10-CM

## 2022-03-10 DIAGNOSIS — I10 Essential (primary) hypertension: Secondary | ICD-10-CM | POA: Diagnosis not present

## 2022-03-10 DIAGNOSIS — E119 Type 2 diabetes mellitus without complications: Secondary | ICD-10-CM | POA: Diagnosis not present

## 2022-03-10 DIAGNOSIS — F419 Anxiety disorder, unspecified: Secondary | ICD-10-CM

## 2022-03-10 DIAGNOSIS — N183 Chronic kidney disease, stage 3 unspecified: Secondary | ICD-10-CM

## 2022-03-10 DIAGNOSIS — E559 Vitamin D deficiency, unspecified: Secondary | ICD-10-CM

## 2022-03-10 DIAGNOSIS — M79661 Pain in right lower leg: Secondary | ICD-10-CM | POA: Insufficient documentation

## 2022-03-10 DIAGNOSIS — R69 Illness, unspecified: Secondary | ICD-10-CM | POA: Diagnosis not present

## 2022-03-10 LAB — COMPREHENSIVE METABOLIC PANEL
ALT: 15 U/L (ref 0–35)
AST: 21 U/L (ref 0–37)
Albumin: 4.2 g/dL (ref 3.5–5.2)
Alkaline Phosphatase: 59 U/L (ref 39–117)
BUN: 18 mg/dL (ref 6–23)
CO2: 25 mEq/L (ref 19–32)
Calcium: 9.8 mg/dL (ref 8.4–10.5)
Chloride: 107 mEq/L (ref 96–112)
Creatinine, Ser: 0.92 mg/dL (ref 0.40–1.20)
GFR: 60.4 mL/min (ref >60.00)
Glucose, Bld: 80 mg/dL (ref 70–99)
Potassium: 4 mEq/L (ref 3.5–5.1)
Sodium: 141 mEq/L (ref 135–145)
Total Bilirubin: 0.6 mg/dL (ref 0.2–1.2)
Total Protein: 7.1 g/dL (ref 6.0–8.3)

## 2022-03-10 LAB — CBC WITH DIFFERENTIAL/PLATELET
Basophils Absolute: 0 10*3/uL (ref 0.0–0.1)
Basophils Relative: 0.6 % (ref 0.0–3.0)
Eosinophils Absolute: 0.1 10*3/uL (ref 0.0–0.7)
Eosinophils Relative: 0.9 % (ref 0.0–5.0)
HCT: 41.6 % (ref 36.0–46.0)
Hemoglobin: 14 g/dL (ref 12.0–15.0)
Lymphocytes Relative: 22.7 % (ref 12.0–46.0)
Lymphs Abs: 1.5 10*3/uL (ref 0.7–4.0)
MCHC: 33.6 g/dL (ref 30.0–36.0)
MCV: 89.7 fl (ref 78.0–100.0)
Monocytes Absolute: 0.6 10*3/uL (ref 0.1–1.0)
Monocytes Relative: 8.8 % (ref 3.0–12.0)
Neutro Abs: 4.3 10*3/uL (ref 1.4–7.7)
Neutrophils Relative %: 67 % (ref 43.0–77.0)
Platelets: 206 10*3/uL (ref 150.0–400.0)
RBC: 4.63 Mil/uL (ref 3.87–5.11)
RDW: 13.5 % (ref 11.5–15.5)
WBC: 6.4 10*3/uL (ref 4.0–10.5)

## 2022-03-10 LAB — VITAMIN D 25 HYDROXY (VIT D DEFICIENCY, FRACTURES): VITD: 20.77 ng/mL — ABNORMAL LOW (ref 30.00–100.00)

## 2022-03-10 LAB — VITAMIN B12: Vitamin B-12: 437 pg/mL (ref 211–911)

## 2022-03-10 LAB — TSH: TSH: 1.12 u[IU]/mL (ref 0.35–5.50)

## 2022-03-10 LAB — HEMOGLOBIN A1C: Hgb A1c MFr Bld: 5.7 % (ref 4.6–6.5)

## 2022-03-10 LAB — LIPID PANEL
Cholesterol: 209 mg/dL — ABNORMAL HIGH (ref 0–200)
HDL: 74.1 mg/dL (ref >39.00)
LDL Cholesterol: 120 mg/dL — ABNORMAL HIGH (ref 0–99)
NonHDL: 134.58
Total CHOL/HDL Ratio: 3
Triglycerides: 71 mg/dL (ref 0.0–149.0)
VLDL: 14.2 mg/dL (ref 0.0–40.0)

## 2022-03-10 MED ORDER — VITAMIN D3 50 MCG (2000 UT) PO CAPS: 2000.0000 [IU] | ORAL_CAPSULE | Freq: Every day | ORAL | 3 refills | Status: AC

## 2022-03-10 MED ORDER — VITAMIN D (ERGOCALCIFEROL) 1.25 MG (50000 UNIT) PO CAPS: 50000.0000 [IU] | ORAL_CAPSULE | ORAL | 0 refills | Status: DC

## 2022-03-10 MED ORDER — MEMANTINE HCL 5 MG PO TABS
5.0000 mg | ORAL_TABLET | Freq: Two times a day (BID) | ORAL | 5 refills | Status: DC
Start: 2022-03-10 — End: 2022-12-07

## 2022-03-10 NOTE — Assessment & Plan Note (Signed)
Sam is getting older, tremors. Mavourneen re-started Fluoxetine

## 2022-03-10 NOTE — Assessment & Plan Note (Signed)
Tiffany Gibbs is getting older, tremors. [name] re-started Fluoxetine

## 2022-03-10 NOTE — Assessment & Plan Note (Signed)
On B12 

## 2022-03-10 NOTE — Assessment & Plan Note (Signed)
Stable Discussed Reduce stress

## 2022-03-10 NOTE — Assessment & Plan Note (Signed)
On NAS diet  

## 2022-03-10 NOTE — Assessment & Plan Note (Signed)
Check A1c. 

## 2022-03-10 NOTE — Assessment & Plan Note (Signed)
Hydrate well 

## 2022-03-10 NOTE — Assessment & Plan Note (Signed)
Turmeric, Mg helped

## 2022-03-10 NOTE — Progress Notes (Signed)
Subjective:  Patient ID: Tiffany Gibbs, female    DOB: [birth year]  Age: 77 y.o. MRN: [id number]  CC: Follow-up   HPI Tiffany Gibbs presents for memory problems, insomnia anxiety Sam is getting older, tremors  Outpatient Medications Prior to Visit  Medication Sig Dispense Refill   ALPRAZolam (XANAX) 0.5 MG tablet TAKE 1 TO 2 TABLETS BY MOUTH TWICE DAILY AS NEEDED FOR ANXIETY AND FOR INSOMNIA 60 tablet 2   Blood Glucose Monitoring Suppl (ONETOUCH VERIO) w/Device KIT Use to check blood sugar daily. DX E11.9 1 kit 0   Cyanocobalamin (B-12) 5000 MCG CAPS Take 1 tablet by mouth once a week.      diphenhydrAMINE (BENADRYL) 25 mg capsule Take 25 mg by mouth daily as needed (allergies).      FLUoxetine (PROZAC) 20 MG capsule Take 20 mg by mouth daily.     fluticasone (FLONASE) 50 MCG/ACT nasal spray Place 2 sprays into both nostrils daily. 16 g 6   glucose blood (ONETOUCH VERIO) test strip USE 1 STRIP TO CHECK GLUCOSE TWICE DAILY AS NEEDED 200 each 1   loratadine (CLARITIN) 10 MG tablet Take 1 tablet (10 mg total) by mouth daily. 30 tablet 11   magnesium oxide (MAG-OX) 400 MG tablet Take 400 mg by mouth daily.     metFORMIN (GLUCOPHAGE) 500 MG tablet TAKE 1 TABLET BY MOUTH TWICE DAILY WITH MEALS 180 tablet 1   Omega-3 Fatty Acids (FISH OIL) 1200 MG CAPS Take 1 capsule by mouth in the morning and at bedtime.     OneTouch Delica Lancets [id number] MISC USE 1  TO CHECK GLUCOSE IN THE MORNING AND 1 AT BEDTIME 200 each 0   potassium chloride (KLOR-CON) 8 MEQ tablet Take 1 tablet by mouth twice daily 180 tablet 1   Turmeric Curcumin 500 MG CAPS Take 1 capsule in the morning and 2 at bedtime     escitalopram (LEXAPRO) 5 MG tablet Take 1 tablet (5 mg total) by mouth daily. 30 tablet 5   memantine (NAMENDA) 5 MG tablet Take 1 tablet (5 mg total) by mouth 2 (two) times daily. 60 tablet 5   No facility-administered medications prior to visit.    ROS: Review of Systems  Constitutional:   Negative for activity change, appetite change, chills, fatigue and unexpected weight change.  HENT:  Negative for congestion, mouth sores and sinus pressure.   Eyes:  Negative for visual disturbance.  Respiratory:  Negative for cough and chest tightness.   Gastrointestinal:  Negative for abdominal pain and nausea.  Genitourinary:  Negative for difficulty urinating, frequency and vaginal pain.  Musculoskeletal:  Negative for back pain and gait problem.  Skin:  Negative for pallor and rash.  Neurological:  Negative for dizziness, tremors, weakness, numbness and headaches.  Psychiatric/Behavioral:  Positive for decreased concentration and sleep disturbance. Negative for confusion and suicidal ideas. The patient is nervous/anxious.     Objective:  BP 124/76 (BP Location: Right Arm, Patient Position: Sitting, Cuff Size: Normal)   Pulse 65   Temp 98 F (36.7 C) (Oral)   Ht '5\' 2"'$  (1.575 m)   Wt 157 lb (71.2 kg)   SpO2 99%   BMI 28.72 kg/m   BP Readings from Last 3 Encounters:  03/10/22 124/76  11/13/21 128/68  10/06/21 132/78    Wt Readings from Last 3 Encounters:  03/10/22 157 lb (71.2 kg)  11/13/21 146 lb (66.2 kg)  10/06/21 150 lb 3.2 oz (68.1 kg)    Physical Exam  Constitutional:      General: She is not in acute distress.    Appearance: Normal appearance. She is well-developed.  HENT:     Head: Normocephalic.     Right Ear: External ear normal.     Left Ear: External ear normal.     Nose: Nose normal.  Eyes:     General:        Right eye: No discharge.        Left eye: No discharge.     Conjunctiva/sclera: Conjunctivae normal.     Pupils: Pupils are equal, round, and reactive to light.  Neck:     Thyroid: No thyromegaly.     Vascular: No JVD.     Trachea: No tracheal deviation.  Cardiovascular:     Rate and Rhythm: Normal rate and regular rhythm.     Heart sounds: Normal heart sounds.  Pulmonary:     Effort: No respiratory distress.     Breath sounds: No  stridor. No wheezing.  Abdominal:     General: Bowel sounds are normal. There is no distension.     Palpations: Abdomen is soft. There is no mass.     Tenderness: There is no abdominal tenderness. There is no guarding or rebound.  Musculoskeletal:        General: No tenderness.     Cervical back: Normal range of motion and neck supple. No rigidity.  Lymphadenopathy:     Cervical: No cervical adenopathy.  Skin:    Findings: No erythema or rash.  Neurological:     Mental Status: She is oriented to person, place, and time.     Cranial Nerves: No cranial nerve deficit.     Motor: No abnormal muscle tone.     Coordination: Coordination normal.     Deep Tendon Reflexes: Reflexes normal.  Psychiatric:        Behavior: Behavior normal.        Thought Content: Thought content normal.        Judgment: Judgment normal.     Lab Results  Component Value Date   WBC 4.9 04/02/2021   HGB 13.5 04/02/2021   HCT 39.9 04/02/2021   PLT 221.0 04/02/2021   GLUCOSE 88 11/13/2021   CHOL 191 12/26/2020   TRIG 70.0 12/26/2020   HDL 74.80 12/26/2020   LDLDIRECT 148.2 02/03/2013   LDLCALC 103 (H) 12/26/2020   ALT 9 11/13/2021   AST 14 11/13/2021   NA 140 11/13/2021   K 4.0 11/13/2021   CL 104 11/13/2021   CREATININE 1.00 11/13/2021   BUN 17 11/13/2021   CO2 28 11/13/2021   TSH 1.41 04/02/2021   INR 1.0 10/28/2018   HGBA1C 5.7 11/13/2021   MICROALBUR 6.0 (H) 06/26/2021    VAS [place] LOWER EXT ART SEG MULTI (SEGMENTALS & LE RAYNAUDS)  Result Date: 08/15/2021  LOWER EXTREMITY DOPPLER STUDY Patient Name:  Tiffany Gibbs  Date of Exam:   08/15/2021 Medical Rec #: [id number]               Accession #:    [id number] Date of Birth: 04-23-1945               Patient Gender: F Patient Age:   28 years Exam Location:  [facility] Procedure:      VAS [place] LOWER EXT ART SEG MULTI (SEGMENTALS & LE RAYNAUDS) Referring Phys: Tyrone Apple Shanon Seawright  --------------------------------------------------------------------------------  Indications: Patient presents with complaints of numbness of the bilateral feet  and up into the right calf, feelings of coldness in the feet and              leg cramping at night. High Risk Factors: Hypertension, Diabetes, past history of smoking.  Performing Technologist: Mariane Masters RVT  Examination Guidelines: A complete evaluation includes at minimum, Doppler waveform signals and systolic blood pressure reading at the level of bilateral brachial, anterior tibial, and posterior tibial arteries, when vessel segments are accessible. Bilateral testing is considered an integral part of a complete examination. Photoelectric Plethysmograph (PPG) waveforms and toe systolic pressure readings are included as required and additional duplex testing as needed. Limited examinations for reoccurring indications may be performed as noted.  ABI Findings: +---------+------------------+-----+---------+--------+ Right    Rt Pressure (mmHg)IndexWaveform Comment  +---------+------------------+-----+---------+--------+ Brachial 154                                      +---------+------------------+-----+---------+--------+ CFA                             triphasic         +---------+------------------+-----+---------+--------+ Popliteal                       triphasic         +---------+------------------+-----+---------+--------+ PTA      180               1.17 triphasic         +---------+------------------+-----+---------+--------+ PERO                            biphasic          +---------+------------------+-----+---------+--------+ DP       174               1.13 triphasic         +---------+------------------+-----+---------+--------+ Great Toe116               0.75 Normal            +---------+------------------+-----+---------+--------+  +---------+------------------+-----+---------+-------+ Left     Lt Pressure (mmHg)IndexWaveform Comment +---------+------------------+-----+---------+-------+ Brachial 152                                     +---------+------------------+-----+---------+-------+ CFA                             triphasic        +---------+------------------+-----+---------+-------+ Popliteal                       triphasic        +---------+------------------+-----+---------+-------+ PTA      180               1.17 triphasic        +---------+------------------+-----+---------+-------+ PERO     157               1.02 triphasic        +---------+------------------+-----+---------+-------+ DP       178               1.16 biphasic         +---------+------------------+-----+---------+-------+ Buddy Duty  0.86 Normal           +---------+------------------+-----+---------+-------+ +-------+-----------+-----------+------------+------------+ ABI/TBIToday's ABIToday's TBIPrevious ABIPrevious TBI +-------+-----------+-----------+------------+------------+ Right  1.17       0.75                                +-------+-----------+-----------+------------+------------+ Left   1.1        0.86                                +-------+-----------+-----------+------------+------------+  Summary: Right: Resting right ankle-brachial index is within normal range. No evidence of significant right lower extremity arterial disease. The right toe-brachial index is normal. Left: Resting left ankle-brachial index is within normal range. No evidence of significant left lower extremity arterial disease. The left toe-brachial index is normal. *See table(s) above for measurements and observations.  Electronically signed by Larae Grooms MD on 08/15/2021 at 10:00:05 PM.    Final     Assessment & Plan:   Problem List Items Addressed This Visit       Cardiovascular and  Mediastinum   Hypertension, essential    On NAS diet        Endocrine   DM2 (diabetes mellitus, type 2) ([place])    Check A1c      Relevant Orders   Hemoglobin A1c     Genitourinary   CRI (chronic renal insufficiency), stage 3 (moderate) (HCC) - Primary    Hydrate well      Relevant Orders   TSH   Urinalysis   CBC with Differential/Platelet   Comprehensive metabolic panel   Lipid panel   Vitamin B12   VITAMIN D 25 Hydroxy (Vit-D Deficiency, Fractures)     Other   Situational depression    Sam is getting older, tremors. Marcie Bal re-started Fluoxetine      Relevant Medications   FLUoxetine (PROZAC) 20 MG capsule   Other Relevant Orders   TSH   Memory difficulty    Stable Discussed Reduce stress      Relevant Orders   TSH   Comprehensive metabolic panel   Cramp of limb    Turmeric, Mg helped      B12 deficiency    On B12      Relevant Orders   CBC with Differential/Platelet   Anxiety disorder    Sam is getting older, tremors. Marcie Bal re-started Fluoxetine      Relevant Medications   FLUoxetine (PROZAC) 20 MG capsule   Other Relevant Orders   Urinalysis   Comprehensive metabolic panel   Other Visit Diagnoses     Vitamin D deficiency       Relevant Orders   VITAMIN D 25 Hydroxy (Vit-D Deficiency, Fractures)         Meds ordered this encounter  Medications   memantine (NAMENDA) 5 MG tablet    Sig: Take 1 tablet (5 mg total) by mouth 2 (two) times daily.    Dispense:  60 tablet    Refill:  5      Follow-up: Return in about 3 months (around 06/08/2022) for a follow-up visit.  Walker Kehr, MD

## 2022-03-10 NOTE — Telephone Encounter (Signed)
We will decide on the orders when I see her.  Thank you

## 2022-03-10 NOTE — Telephone Encounter (Signed)
Notified pt w/MD response.../lmb 

## 2022-03-11 ENCOUNTER — Other Ambulatory Visit: Payer: Self-pay | Admitting: Internal Medicine

## 2022-03-11 LAB — URINALYSIS, ROUTINE W REFLEX MICROSCOPIC
Bilirubin Urine: NEGATIVE
Ketones, ur: NEGATIVE
Nitrite: NEGATIVE
Specific Gravity, Urine: 1.025 (ref 1.000–1.030)
Total Protein, Urine: NEGATIVE
Urine Glucose: NEGATIVE
Urobilinogen, UA: 0.2 (ref 0.0–1.0)
pH: 6 (ref 5.0–8.0)

## 2022-03-11 MED ORDER — AMPICILLIN 500 MG PO CAPS: 500.0000 mg | ORAL_CAPSULE | Freq: Three times a day (TID) | ORAL | 1 refills | Status: DC

## 2022-03-12 ENCOUNTER — Ambulatory Visit: Payer: Medicare HMO | Admitting: Internal Medicine

## 2022-03-13 ENCOUNTER — Other Ambulatory Visit: Payer: Self-pay | Admitting: Internal Medicine

## 2022-03-13 DIAGNOSIS — E119 Type 2 diabetes mellitus without complications: Secondary | ICD-10-CM

## 2022-03-21 ENCOUNTER — Other Ambulatory Visit: Payer: Self-pay | Admitting: Internal Medicine

## 2022-03-21 DIAGNOSIS — F411 Generalized anxiety disorder: Secondary | ICD-10-CM

## 2022-03-21 DIAGNOSIS — J4520 Mild intermittent asthma, uncomplicated: Secondary | ICD-10-CM

## 2022-03-21 DIAGNOSIS — E538 Deficiency of other specified B group vitamins: Secondary | ICD-10-CM

## 2022-03-21 DIAGNOSIS — R413 Other amnesia: Secondary | ICD-10-CM

## 2022-03-21 DIAGNOSIS — Z0000 Encounter for general adult medical examination without abnormal findings: Secondary | ICD-10-CM

## 2022-03-21 DIAGNOSIS — E119 Type 2 diabetes mellitus without complications: Secondary | ICD-10-CM

## 2022-03-21 DIAGNOSIS — I8002 Phlebitis and thrombophlebitis of superficial vessels of left lower extremity: Secondary | ICD-10-CM

## 2022-03-21 DIAGNOSIS — I10 Essential (primary) hypertension: Secondary | ICD-10-CM

## 2022-04-19 ENCOUNTER — Other Ambulatory Visit: Payer: Self-pay | Admitting: Internal Medicine

## 2022-04-19 DIAGNOSIS — E119 Type 2 diabetes mellitus without complications: Secondary | ICD-10-CM

## 2022-04-19 DIAGNOSIS — E538 Deficiency of other specified B group vitamins: Secondary | ICD-10-CM

## 2022-04-19 DIAGNOSIS — J4520 Mild intermittent asthma, uncomplicated: Secondary | ICD-10-CM

## 2022-04-19 DIAGNOSIS — I8002 Phlebitis and thrombophlebitis of superficial vessels of left lower extremity: Secondary | ICD-10-CM

## 2022-04-19 DIAGNOSIS — F411 Generalized anxiety disorder: Secondary | ICD-10-CM

## 2022-04-19 DIAGNOSIS — I10 Essential (primary) hypertension: Secondary | ICD-10-CM

## 2022-04-19 DIAGNOSIS — Z0000 Encounter for general adult medical examination without abnormal findings: Secondary | ICD-10-CM

## 2022-04-19 DIAGNOSIS — R413 Other amnesia: Secondary | ICD-10-CM

## 2022-05-05 ENCOUNTER — Telehealth: Payer: Self-pay | Admitting: Internal Medicine

## 2022-05-05 NOTE — Telephone Encounter (Signed)
Patient called stating that she is currently having lower back and waist pain. Patient states that she has been drinking 8 glasses of water a day.This pain started out of no where. Patient wants to know if Dr.[name] could send in something for  pain to her pharmacy. Patient pharmacy is  [facility] [address] [place] - [address]

## 2022-05-08 NOTE — Telephone Encounter (Addendum)
Aften should probably see one of [place] in the office, get a urine test done. Use Blue emu cream on the back, take Tylenol 650 mg 4 times a day as needed.  Use heat Thanks

## 2022-05-08 NOTE — Telephone Encounter (Signed)
Called pt and relayed MD response, pt verbalized understanding and states she will do that. She declined scheduling an appt at this time since she is still in bed but states she will call back to schedule once she can get up and look at her calendar as she believes her husband has an appt Monday too.

## 2022-05-11 ENCOUNTER — Other Ambulatory Visit: Payer: Self-pay | Admitting: Internal Medicine

## 2022-05-15 ENCOUNTER — Telehealth: Payer: Self-pay | Admitting: Internal Medicine

## 2022-05-15 DIAGNOSIS — E119 Type 2 diabetes mellitus without complications: Secondary | ICD-10-CM

## 2022-05-15 MED ORDER — ONETOUCH DELICA LANCETS 33G MISC: 0 refills | Status: DC

## 2022-05-15 NOTE — Telephone Encounter (Signed)
Sent rx to [facility]../lmb 

## 2022-05-15 NOTE — Telephone Encounter (Signed)
Prescription Request  05/15/2022  LOV: 03/10/2022  What is the name of the medication or equipment? lancets  Have you contacted your pharmacy to request a refill? Yes   Which pharmacy would you like this sent to?  [facility] [address], [place] - [address] [place] [address] Phone: [phone] Fax: [phone]    Patient notified that their request is being sent to the clinical staff for review and that they should receive a response within 2 business days.   Please advise at Mobile [phone] (mobile)

## 2022-05-19 DIAGNOSIS — N182 Chronic kidney disease, stage 2 (mild): Secondary | ICD-10-CM | POA: Diagnosis not present

## 2022-05-19 DIAGNOSIS — G4733 Obstructive sleep apnea (adult) (pediatric): Secondary | ICD-10-CM | POA: Diagnosis not present

## 2022-05-19 DIAGNOSIS — E1122 Type 2 diabetes mellitus with diabetic chronic kidney disease: Secondary | ICD-10-CM | POA: Diagnosis not present

## 2022-05-19 DIAGNOSIS — R3129 Other microscopic hematuria: Secondary | ICD-10-CM | POA: Diagnosis not present

## 2022-05-19 DIAGNOSIS — N281 Cyst of kidney, acquired: Secondary | ICD-10-CM | POA: Diagnosis not present

## 2022-05-19 DIAGNOSIS — I129 Hypertensive chronic kidney disease with stage 1 through stage 4 chronic kidney disease, or unspecified chronic kidney disease: Secondary | ICD-10-CM | POA: Diagnosis not present

## 2022-05-19 DIAGNOSIS — N390 Urinary tract infection, site not specified: Secondary | ICD-10-CM | POA: Diagnosis not present

## 2022-05-21 LAB — LAB REPORT - SCANNED
Albumin, Urine POC: 7.4
Albumin/Creatinine Ratio, Urine, POC: 6
Creatinine, Urine.: 117.7
EGFR: 54

## 2022-05-29 ENCOUNTER — Other Ambulatory Visit: Payer: Self-pay | Admitting: Internal Medicine

## 2022-06-05 ENCOUNTER — Telehealth: Payer: Self-pay | Admitting: Internal Medicine

## 2022-06-05 NOTE — Telephone Encounter (Signed)
Called pt verified which medicine she was taking at sleep. Pt states she take alprazolam. Inform her the direction on file for alprazolam. She hasn't been taking in day just at night. Inform pt taking 3 when she need to is ok.Pt verbalize understand .Tiffany KitchenShearon Stalls

## 2022-06-05 NOTE — Telephone Encounter (Signed)
Pt called wanted Dr. Macario Golds to know that for the last past 2 days she have took 3 pills each night and she been getting 8 hours of sleep. Pt wanted Dr Macario Golds to know if this is okay or just go back to 1 or pills during the night. Please call pt back with advice.

## 2022-06-07 ENCOUNTER — Other Ambulatory Visit: Payer: Self-pay | Admitting: Internal Medicine

## 2022-06-14 DIAGNOSIS — R0981 Nasal congestion: Secondary | ICD-10-CM | POA: Diagnosis not present

## 2022-06-14 DIAGNOSIS — J069 Acute upper respiratory infection, unspecified: Secondary | ICD-10-CM | POA: Diagnosis not present

## 2022-06-14 DIAGNOSIS — R051 Acute cough: Secondary | ICD-10-CM | POA: Diagnosis not present

## 2022-06-15 ENCOUNTER — Telehealth: Payer: Self-pay | Admitting: Internal Medicine

## 2022-06-15 NOTE — Telephone Encounter (Signed)
MD is not in the office until tomorrow. Will forward msg to his desk top to review.Marland KitchenRaechel Chute

## 2022-06-15 NOTE — Telephone Encounter (Signed)
Patient wants recommendation for otc cough syrup safe for diabetics. She said she was having trouble finding one. She would like a call back at [phone].

## 2022-06-16 ENCOUNTER — Ambulatory Visit: Payer: Medicare HMO | Admitting: Internal Medicine

## 2022-06-16 NOTE — Telephone Encounter (Signed)
Notified pt w/MD response.../lmb 

## 2022-06-16 NOTE — Telephone Encounter (Signed)
Robitussin Sugar-Free  Thx

## 2022-06-22 ENCOUNTER — Other Ambulatory Visit: Payer: Self-pay | Admitting: Internal Medicine

## 2022-06-25 ENCOUNTER — Encounter: Payer: Self-pay | Admitting: Internal Medicine

## 2022-06-25 ENCOUNTER — Ambulatory Visit (INDEPENDENT_AMBULATORY_CARE_PROVIDER_SITE_OTHER): Payer: Medicare HMO | Admitting: Internal Medicine

## 2022-06-25 ENCOUNTER — Ambulatory Visit (INDEPENDENT_AMBULATORY_CARE_PROVIDER_SITE_OTHER): Payer: Medicare HMO

## 2022-06-25 ENCOUNTER — Telehealth: Payer: Self-pay | Admitting: Internal Medicine

## 2022-06-25 VITALS — BP 136/80 | HR 83 | Temp 98.3°F | Ht 62.0 in | Wt 162.0 lb

## 2022-06-25 DIAGNOSIS — E119 Type 2 diabetes mellitus without complications: Secondary | ICD-10-CM

## 2022-06-25 DIAGNOSIS — M5440 Lumbago with sciatica, unspecified side: Secondary | ICD-10-CM

## 2022-06-25 DIAGNOSIS — Z7984 Long term (current) use of oral hypoglycemic drugs: Secondary | ICD-10-CM | POA: Diagnosis not present

## 2022-06-25 DIAGNOSIS — F419 Anxiety disorder, unspecified: Secondary | ICD-10-CM

## 2022-06-25 DIAGNOSIS — R059 Cough, unspecified: Secondary | ICD-10-CM | POA: Diagnosis not present

## 2022-06-25 DIAGNOSIS — J4540 Moderate persistent asthma, uncomplicated: Secondary | ICD-10-CM

## 2022-06-25 DIAGNOSIS — E538 Deficiency of other specified B group vitamins: Secondary | ICD-10-CM

## 2022-06-25 DIAGNOSIS — I700 Atherosclerosis of aorta: Secondary | ICD-10-CM | POA: Diagnosis not present

## 2022-06-25 MED ORDER — CEFDINIR 300 MG PO CAPS: 300.0000 mg | ORAL_CAPSULE | Freq: Two times a day (BID) | ORAL | 0 refills | Status: DC

## 2022-06-25 MED ORDER — ALPRAZOLAM 0.5 MG PO TABS
ORAL_TABLET | ORAL | 2 refills | Status: DC
Start: 2022-06-25 — End: 2022-09-17

## 2022-06-25 MED ORDER — OXYCODONE-ACETAMINOPHEN 10-325 MG PO TABS: 1.0000 | ORAL_TABLET | Freq: Four times a day (QID) | ORAL | 0 refills | Status: DC | PRN

## 2022-06-25 MED ORDER — ALIGN 4 MG PO CAPS: 1.0000 | ORAL_CAPSULE | Freq: Every day | ORAL | 0 refills | Status: AC

## 2022-06-25 NOTE — Progress Notes (Signed)
Subjective:  Patient ID: Tiffany Gibbs, female    DOB: 1945-03-22  Age: 77 y.o. MRN: [id number]  CC: No chief complaint on file.   HPI Tiffany Gibbs presents for a URI, anxiety, depression C/o cough and congestion x 3 weeks. Pt took Zpac, Prednisone  Outpatient Medications Prior to Visit  Medication Sig Dispense Refill   Blood Glucose Monitoring Suppl (ONETOUCH VERIO) w/Device KIT Use to check blood sugar daily. DX E11.9 1 kit 0   Cholecalciferol (VITAMIN D3) 50 MCG (2000 UT) CAPS Take 1 capsule (2,000 Units total) by mouth daily. 100 capsule 3   Cyanocobalamin (B-12) 5000 MCG CAPS Take 1 tablet by mouth once a week.      diphenhydrAMINE (BENADRYL) 25 mg capsule Take 25 mg by mouth daily as needed (allergies).      FLUoxetine (PROZAC) 20 MG capsule Take 20 mg by mouth daily.     FLUoxetine (PROZAC) 20 MG tablet TAKE 1 TO 2 TABLETS BY MOUTH ONCE DAILY 60 tablet 5   fluticasone (FLONASE) 50 MCG/ACT nasal spray Place 2 sprays into both nostrils daily. 16 g 6   loratadine (CLARITIN) 10 MG tablet Take 1 tablet (10 mg total) by mouth daily. 30 tablet 11   magnesium oxide (MAG-OX) 400 MG tablet Take 400 mg by mouth daily.     memantine (NAMENDA) 5 MG tablet Take 1 tablet (5 mg total) by mouth 2 (two) times daily. 60 tablet 5   metFORMIN (GLUCOPHAGE) 500 MG tablet TAKE 1 TABLET BY MOUTH TWICE DAILY WITH MEALS 180 tablet 3   Omega-3 Fatty Acids (FISH OIL) 1200 MG CAPS Take 1 capsule by mouth in the morning and at bedtime.     OneTouch Delica Lancets 33G MISC Use to check blood sugars in the morning and afternoon 200 each 0   ONETOUCH VERIO test strip USE 1 STRIP TO CHECK GLUCOSE TWICE DAILY AS NEEDED 200 each 0   potassium chloride (KLOR-CON) 8 MEQ tablet Take 1 tablet by mouth twice daily 180 tablet 1   Turmeric Curcumin 500 MG CAPS Take 1 capsule in the morning and 2 at bedtime     Vitamin D, Ergocalciferol, (DRISDOL) 1.25 MG (50000 UNIT) CAPS capsule TAKE 1 CAPSULE BY MOUTH  EVERY 7 DAYS 8 capsule 0   ALPRAZolam (XANAX) 0.5 MG tablet TAKE 1 TO 2 TABLETS BY MOUTH TWICE DAILY AS NEEDED FOR ANXIETY AND FOR INSOMNIA 60 tablet 2   ampicillin (PRINCIPEN) 500 MG capsule Take 1 capsule (500 mg total) by mouth 3 (three) times daily. 15 capsule 1   No facility-administered medications prior to visit.    ROS: Review of Systems  Constitutional:  Negative for activity change, appetite change, chills, fatigue and unexpected weight change.  HENT:  Positive for congestion, postnasal drip, rhinorrhea and sinus pain. Negative for mouth sores and sinus pressure.   Eyes:  Negative for visual disturbance.  Respiratory:  Positive for cough and chest tightness.   Gastrointestinal:  Negative for abdominal pain and nausea.  Genitourinary:  Negative for difficulty urinating, frequency and vaginal pain.  Musculoskeletal:  Negative for back pain and gait problem.  Skin:  Negative for pallor and rash.  Neurological:  Negative for dizziness, tremors, weakness, numbness and headaches.  Psychiatric/Behavioral:  Negative for confusion, sleep disturbance and suicidal ideas. The patient is nervous/anxious.     Objective:  BP 136/80 (BP Location: Left Arm, Patient Position: Sitting, Cuff Size: Large)   Pulse 83   Temp 98.3 F (36.8 C) (  Oral)   Ht 5\' 2"  (1.575 m)   Wt 162 lb (73.5 kg)   BMI 29.63 kg/m   BP Readings from Last 3 Encounters:  06/25/22 136/80  03/10/22 124/76  11/13/21 128/68    Wt Readings from Last 3 Encounters:  06/25/22 162 lb (73.5 kg)  03/10/22 157 lb (71.2 kg)  11/13/21 146 lb (66.2 kg)    Physical Exam Constitutional:      General: She is not in acute distress.    Appearance: Normal appearance. She is well-developed. She is not toxic-appearing.  HENT:     Head: Normocephalic.     Right Ear: External ear normal.     Left Ear: External ear normal.     Nose: Nose normal.  Eyes:     General:        Right eye: No discharge.        Left eye: No  discharge.     Conjunctiva/sclera: Conjunctivae normal.     Pupils: Pupils are equal, round, and reactive to light.  Neck:     Thyroid: No thyromegaly.     Vascular: No JVD.     Trachea: No tracheal deviation.  Cardiovascular:     Rate and Rhythm: Normal rate and regular rhythm.     Heart sounds: Normal heart sounds.  Pulmonary:     Effort: No respiratory distress.     Breath sounds: No stridor. Rhonchi present. No wheezing or rales.  Abdominal:     General: Bowel sounds are normal. There is no distension.     Palpations: Abdomen is soft. There is no mass.     Tenderness: There is no abdominal tenderness. There is no guarding or rebound.  Musculoskeletal:        General: No tenderness.     Cervical back: Normal range of motion and neck supple. No rigidity.  Lymphadenopathy:     Cervical: No cervical adenopathy.  Skin:    Findings: No erythema or rash.  Neurological:     Mental Status: She is oriented to person, place, and time.     Cranial Nerves: No cranial nerve deficit.     Motor: No abnormal muscle tone.     Coordination: Coordination normal.     Deep Tendon Reflexes: Reflexes normal.  Psychiatric:        Behavior: Behavior normal.        Thought Content: Thought content normal.        Judgment: Judgment normal.   LS w/pain  Lab Results  Component Value Date   WBC 6.4 03/10/2022   HGB 14.0 03/10/2022   HCT 41.6 03/10/2022   PLT 206.0 03/10/2022   GLUCOSE 80 03/10/2022   CHOL 209 (H) 03/10/2022   TRIG 71.0 03/10/2022   HDL 74.10 03/10/2022   LDLDIRECT 148.2 02/03/2013   LDLCALC 120 (H) 03/10/2022   ALT 15 03/10/2022   AST 21 03/10/2022   NA 141 03/10/2022   K 4.0 03/10/2022   CL 107 03/10/2022   CREATININE 0.92 03/10/2022   BUN 18 03/10/2022   CO2 25 03/10/2022   TSH 1.12 03/10/2022   INR 1.0 10/28/2018   HGBA1C 5.7 03/10/2022   MICROALBUR 6.0 (H) 06/26/2021    VAS [place] LOWER EXT ART SEG MULTI (SEGMENTALS & LE RAYNAUDS)  Result Date: 08/15/2021  LOWER  EXTREMITY DOPPLER STUDY Patient Name:  Tiffany Gibbs  Date of Exam:   08/15/2021 Medical Rec #: [id number]  Accession #:    [id number] Date of Birth: 06-Sep-1945               Patient Gender: F Patient Age:   45 years Exam Location:  [facility] Procedure:      VAS [place] LOWER EXT ART SEG MULTI (SEGMENTALS & LE RAYNAUDS) Referring Phys: Macarthur Critchley Britlee Skolnik --------------------------------------------------------------------------------  Indications: Patient presents with complaints of numbness of the bilateral feet              and up into the right calf, feelings of coldness in the feet and              leg cramping at night. High Risk Factors: Hypertension, Diabetes, past history of smoking.  Performing Technologist: Olegario Shearer RVT  Examination Guidelines: A complete evaluation includes at minimum, Doppler waveform signals and systolic blood pressure reading at the level of bilateral brachial, anterior tibial, and posterior tibial arteries, when vessel segments are accessible. Bilateral testing is considered an integral part of a complete examination. Photoelectric Plethysmograph (PPG) waveforms and toe systolic pressure readings are included as required and additional duplex testing as needed. Limited examinations for reoccurring indications may be performed as noted.  ABI Findings: +---------+------------------+-----+---------+--------+ Right    Rt Pressure (mmHg)IndexWaveform Comment  +---------+------------------+-----+---------+--------+ Brachial 154                                      +---------+------------------+-----+---------+--------+ CFA                             triphasic         +---------+------------------+-----+---------+--------+ Popliteal                       triphasic         +---------+------------------+-----+---------+--------+ PTA      180               1.17 triphasic         +---------+------------------+-----+---------+--------+ PERO                             biphasic          +---------+------------------+-----+---------+--------+ DP       174               1.13 triphasic         +---------+------------------+-----+---------+--------+ Great Toe116               0.75 Normal            +---------+------------------+-----+---------+--------+ +---------+------------------+-----+---------+-------+ Left     Lt Pressure (mmHg)IndexWaveform Comment +---------+------------------+-----+---------+-------+ Brachial 152                                     +---------+------------------+-----+---------+-------+ CFA                             triphasic        +---------+------------------+-----+---------+-------+ Popliteal                       triphasic        +---------+------------------+-----+---------+-------+ PTA      180  1.17 triphasic        +---------+------------------+-----+---------+-------+ PERO     157               1.02 triphasic        +---------+------------------+-----+---------+-------+ DP       178               1.16 biphasic         +---------+------------------+-----+---------+-------+ Great Toe132               0.86 Normal           +---------+------------------+-----+---------+-------+ +-------+-----------+-----------+------------+------------+ ABI/TBIToday's ABIToday's TBIPrevious ABIPrevious TBI +-------+-----------+-----------+------------+------------+ Right  1.17       0.75                                +-------+-----------+-----------+------------+------------+ Left   1.1        0.86                                +-------+-----------+-----------+------------+------------+  Summary: Right: Resting right ankle-brachial index is within normal range. No evidence of significant right lower extremity arterial disease. The right toe-brachial index is normal. Left: Resting left ankle-brachial index is within normal range. No evidence of  significant left lower extremity arterial disease. The left toe-brachial index is normal. *See table(s) above for measurements and observations.  Electronically signed by Lance Muss MD on 08/15/2021 at 10:00:05 PM.    Final     Assessment & Plan:   Problem List Items Addressed This Visit     Anxiety disorder    On Fluoxetine      Relevant Medications   ALPRAZolam (XANAX) 0.5 MG tablet   LOW BACK PAIN    Percocet 10/325 prn  Potential benefits of a short/long term opioids use as well as potential risks (i.e. addiction risk, apnea etc) and complications (i.e. Somnolence, constipation and others) were explained to the patient and were aknowledged.       Relevant Medications   oxyCODONE-acetaminophen (PERCOCET) 10-325 MG tablet   B12 deficiency    On B12      DM2 (diabetes mellitus, type 2) (HCC)    Check A1c      Relevant Orders   Comprehensive metabolic panel   Hemoglobin A1c   Asthmatic bronchitis - Primary    CXR Omnicef po      Relevant Orders   DG Chest 2 View      Meds ordered this encounter  Medications   ALPRAZolam (XANAX) 0.5 MG tablet    Sig: TAKE 1 TO 2 TABLETS BY MOUTH TWICE DAILY AS NEEDED FOR ANXIETY AND FOR INSOMNIA    Dispense:  60 tablet    Refill:  2   oxyCODONE-acetaminophen (PERCOCET) 10-325 MG tablet    Sig: Take 1 tablet by mouth every 6 (six) hours as needed for pain.    Dispense:  20 tablet    Refill:  0   cefdinir (OMNICEF) 300 MG capsule    Sig: Take 1 capsule (300 mg total) by mouth 2 (two) times daily.    Dispense:  20 capsule    Refill:  0   Probiotic Product (ALIGN) 4 MG CAPS    Sig: Take 1 capsule (4 mg total) by mouth daily.    Dispense:  30 capsule    Refill:  0      Follow-up: Return  in about 6 weeks (around 08/06/2022) for a follow-up visit.  Sonda Primes, MD

## 2022-06-25 NOTE — Assessment & Plan Note (Signed)
Percocet 10/325 prn  Potential benefits of a short/long term opioids use as well as potential risks (i.e. addiction risk, apnea etc) and complications (i.e. Somnolence, constipation and others) were explained to the patient and were aknowledged.

## 2022-06-25 NOTE — Assessment & Plan Note (Signed)
CXR Omnicef po

## 2022-06-25 NOTE — Assessment & Plan Note (Signed)
On B12 

## 2022-06-25 NOTE — Assessment & Plan Note (Signed)
Check A1c. 

## 2022-06-25 NOTE — Telephone Encounter (Signed)
Pharmacy called stating that he can't prescribe medication for oxyCODONE-acetaminophen (PERCOCET) 10-325 MG tablet  because the dosage is to high for a pt that haven't been taking that type of medication and also being on ALPRAZolam (XANAX) 0.5 MG tablet. Please advised.  Best call back Joe [phone]

## 2022-06-25 NOTE — Assessment & Plan Note (Signed)
On Fluoxetine  

## 2022-06-26 NOTE — Telephone Encounter (Signed)
Okay to dispense.  She is a rapid metabolizer.  Thanks

## 2022-06-29 NOTE — Telephone Encounter (Signed)
Called pharmacy spoke w/Amanda give her MD response.Marland KitchenRaechel Chute

## 2022-07-23 DIAGNOSIS — M25551 Pain in right hip: Secondary | ICD-10-CM | POA: Diagnosis not present

## 2022-07-23 DIAGNOSIS — M5136 Other intervertebral disc degeneration, lumbar region: Secondary | ICD-10-CM | POA: Diagnosis not present

## 2022-07-30 DIAGNOSIS — M25559 Pain in unspecified hip: Secondary | ICD-10-CM | POA: Diagnosis not present

## 2022-08-02 DIAGNOSIS — S80861A Insect bite (nonvenomous), right lower leg, initial encounter: Secondary | ICD-10-CM | POA: Diagnosis not present

## 2022-08-02 DIAGNOSIS — S80862A Insect bite (nonvenomous), left lower leg, initial encounter: Secondary | ICD-10-CM | POA: Diagnosis not present

## 2022-08-05 ENCOUNTER — Encounter: Payer: Self-pay | Admitting: Pharmacist

## 2022-08-05 NOTE — Progress Notes (Signed)
[facility]) [facility] Quality Pharmacy Team Statin Quality Measure Assessment  08/05/2022  Tiffany Gibbs 1945/06/09 [id number]  Per review of chart and payor information, she has a diagnosis of diabetes but is not currently filling a statin prescription.  This places patient into the Statin Use In Patients with Diabetes (SUPD) measure for CMS.    Patient has documented trials of statins with reported with muscle aches and myalgias, but no corresponding CPT codes that would exclude patient from SUPD measure.  Please consider evaluating her past statin intolerance and add the coordinating diagnosis code at tomorrow's office visit to remover her from the measure for 2024.  Code for past statin intolerance or  other exclusions (required annually)  Provider Requirements: Associate code during an office visit or telehealth encounter  Drug Induced Myopathy G72.0   Myopathy, unspecified G72.9   Myositis, unspecified M60.9   Rhabdomyolysis M62.82   Cirrhosis of liver K74.69   Prediabetes R73.03   PCOS E28.2   Thank you for allowing [facility] pharmacy to be a part of this patient's care.  Dellie Burns, PharmD Clinical Pharmacist   Direct Dial: [phone]

## 2022-08-06 ENCOUNTER — Ambulatory Visit (INDEPENDENT_AMBULATORY_CARE_PROVIDER_SITE_OTHER): Payer: Medicare HMO | Admitting: Internal Medicine

## 2022-08-06 ENCOUNTER — Other Ambulatory Visit (INDEPENDENT_AMBULATORY_CARE_PROVIDER_SITE_OTHER): Payer: Medicare HMO

## 2022-08-06 ENCOUNTER — Encounter: Payer: Self-pay | Admitting: Internal Medicine

## 2022-08-06 VITALS — BP 110/70 | HR 73 | Temp 98.0°F | Ht 62.0 in | Wt 164.0 lb

## 2022-08-06 DIAGNOSIS — E538 Deficiency of other specified B group vitamins: Secondary | ICD-10-CM | POA: Diagnosis not present

## 2022-08-06 DIAGNOSIS — R413 Other amnesia: Secondary | ICD-10-CM

## 2022-08-06 DIAGNOSIS — I10 Essential (primary) hypertension: Secondary | ICD-10-CM

## 2022-08-06 DIAGNOSIS — Z7984 Long term (current) use of oral hypoglycemic drugs: Secondary | ICD-10-CM | POA: Diagnosis not present

## 2022-08-06 DIAGNOSIS — N183 Chronic kidney disease, stage 3 unspecified: Secondary | ICD-10-CM

## 2022-08-06 DIAGNOSIS — J4540 Moderate persistent asthma, uncomplicated: Secondary | ICD-10-CM | POA: Diagnosis not present

## 2022-08-06 DIAGNOSIS — E119 Type 2 diabetes mellitus without complications: Secondary | ICD-10-CM

## 2022-08-06 DIAGNOSIS — F419 Anxiety disorder, unspecified: Secondary | ICD-10-CM

## 2022-08-06 DIAGNOSIS — M5440 Lumbago with sciatica, unspecified side: Secondary | ICD-10-CM | POA: Diagnosis not present

## 2022-08-06 LAB — COMPREHENSIVE METABOLIC PANEL
ALT: 16 U/L (ref 0–35)
AST: 19 U/L (ref 0–37)
Albumin: 4.7 g/dL (ref 3.5–5.2)
Alkaline Phosphatase: 63 U/L (ref 39–117)
BUN: 20 mg/dL (ref 6–23)
CO2: 26 mEq/L (ref 19–32)
Calcium: 10.1 mg/dL (ref 8.4–10.5)
Chloride: 104 mEq/L (ref 96–112)
Creatinine, Ser: 0.95 mg/dL (ref 0.40–1.20)
GFR: 57.96 mL/min — ABNORMAL LOW (ref >60.00)
Glucose, Bld: 83 mg/dL (ref 70–99)
Potassium: 3.7 mEq/L (ref 3.5–5.1)
Sodium: 140 mEq/L (ref 135–145)
Total Bilirubin: 0.8 mg/dL (ref 0.2–1.2)
Total Protein: 7.6 g/dL (ref 6.0–8.3)

## 2022-08-06 LAB — SEDIMENTATION RATE: Sed Rate: 13 mm/hr (ref 0–30)

## 2022-08-06 LAB — HEMOGLOBIN A1C: Hgb A1c MFr Bld: 5.6 % (ref 4.6–6.5)

## 2022-08-06 MED ORDER — RYBELSUS 7 MG PO TABS: 1.0000 | ORAL_TABLET | Freq: Every day | ORAL | 1 refills | Status: DC

## 2022-08-06 MED ORDER — RYBELSUS 3 MG PO TABS: 3.0000 mg | ORAL_TABLET | Freq: Every day | ORAL | 3 refills | Status: DC

## 2022-08-06 NOTE — Assessment & Plan Note (Signed)
Rybelsus may help

## 2022-08-06 NOTE — Assessment & Plan Note (Signed)
Off Fluoxetine; Alprazolam - rare use

## 2022-08-06 NOTE — Assessment & Plan Note (Signed)
Will start Rybelsus

## 2022-08-06 NOTE — Progress Notes (Signed)
Subjective:  Patient ID: Tiffany Gibbs, female    DOB: March 01, 1945  Age: 77 y.o. MRN: [id number]  CC: Follow-up (6 WEEK F/U, pt has concerns about weight, discuss Wegovy and rt knee pain)   HPI Tiffany Gibbs presents for DM, HTN, wt gain, neuropathy  Outpatient Medications Prior to Visit  Medication Sig Dispense Refill   ALPRAZolam (XANAX) 0.5 MG tablet TAKE 1 TO 2 TABLETS BY MOUTH TWICE DAILY AS NEEDED FOR ANXIETY AND FOR INSOMNIA 60 tablet 2   Blood Glucose Monitoring Suppl (ONETOUCH VERIO) w/Device KIT Use to check blood sugar daily. DX E11.9 1 kit 0   Cholecalciferol (VITAMIN D3) 50 MCG (2000 UT) CAPS Take 1 capsule (2,000 Units total) by mouth daily. 100 capsule 3   Cyanocobalamin (B-12) 5000 MCG CAPS Take 1 tablet by mouth once a week.      diphenhydrAMINE (BENADRYL) 25 mg capsule Take 25 mg by mouth daily as needed (allergies).      FLUoxetine (PROZAC) 20 MG capsule Take 20 mg by mouth daily.     FLUoxetine (PROZAC) 20 MG tablet TAKE 1 TO 2 TABLETS BY MOUTH ONCE DAILY 60 tablet 5   fluticasone (FLONASE) 50 MCG/ACT nasal spray Place 2 sprays into both nostrils daily. 16 g 6   loratadine (CLARITIN) 10 MG tablet Take 1 tablet (10 mg total) by mouth daily. 30 tablet 11   magnesium oxide (MAG-OX) 400 MG tablet Take 400 mg by mouth daily.     memantine (NAMENDA) 5 MG tablet Take 1 tablet (5 mg total) by mouth 2 (two) times daily. 60 tablet 5   metFORMIN (GLUCOPHAGE) 500 MG tablet TAKE 1 TABLET BY MOUTH TWICE DAILY WITH MEALS 180 tablet 3   Omega-3 Fatty Acids (FISH OIL) 1200 MG CAPS Take 1 capsule by mouth in the morning and at bedtime.     OneTouch Delica Lancets 33G MISC Use to check blood sugars in the morning and afternoon 200 each 0   ONETOUCH VERIO test strip USE 1 STRIP TO CHECK GLUCOSE TWICE DAILY AS NEEDED 200 each 0   oxyCODONE-acetaminophen (PERCOCET) 10-325 MG tablet Take 1 tablet by mouth every 6 (six) hours as needed for pain. 20 tablet 0   potassium  chloride (KLOR-CON) 8 MEQ tablet Take 1 tablet by mouth twice daily 180 tablet 1   Probiotic Product (ALIGN) 4 MG CAPS Take 1 capsule (4 mg total) by mouth daily. 30 capsule 0   Turmeric Curcumin 500 MG CAPS Take 1 capsule in the morning and 2 at bedtime     cefdinir (OMNICEF) 300 MG capsule Take 1 capsule (300 mg total) by mouth 2 (two) times daily. 20 capsule 0   Vitamin D, Ergocalciferol, (DRISDOL) 1.25 MG (50000 UNIT) CAPS capsule TAKE 1 CAPSULE BY MOUTH EVERY 7 DAYS 8 capsule 0   No facility-administered medications prior to visit.    ROS: Review of Systems  Constitutional:  Positive for unexpected weight change. Negative for activity change, appetite change, chills and fatigue.  HENT:  Negative for congestion, mouth sores and sinus pressure.   Eyes:  Negative for visual disturbance.  Respiratory:  Negative for cough and chest tightness.   Gastrointestinal:  Negative for abdominal pain and nausea.  Genitourinary:  Negative for difficulty urinating, frequency and vaginal pain.  Musculoskeletal:  Negative for back pain and gait problem.  Skin:  Negative for pallor and rash.  Neurological:  Negative for dizziness, tremors, weakness, numbness and headaches.  Psychiatric/Behavioral:  Negative for confusion, sleep  disturbance and suicidal ideas. The patient is nervous/anxious.     Objective:  BP 110/70 (BP Location: Right Arm, Patient Position: Sitting, Cuff Size: Normal)   Pulse 73   Temp 98 F (36.7 C) (Oral)   Ht 5\' 2"  (1.575 m)   Wt 164 lb (74.4 kg)   SpO2 94%   BMI 30.00 kg/m   BP Readings from Last 3 Encounters:  08/06/22 110/70  06/25/22 136/80  03/10/22 124/76    Wt Readings from Last 3 Encounters:  08/06/22 164 lb (74.4 kg)  06/25/22 162 lb (73.5 kg)  03/10/22 157 lb (71.2 kg)    Physical Exam Constitutional:      General: She is not in acute distress.    Appearance: Normal appearance. She is well-developed. She is obese.  HENT:     Head: Normocephalic.      Right Ear: External ear normal.     Left Ear: External ear normal.     Nose: Nose normal.  Eyes:     General:        Right eye: No discharge.        Left eye: No discharge.     Conjunctiva/sclera: Conjunctivae normal.     Pupils: Pupils are equal, round, and reactive to light.  Neck:     Thyroid: No thyromegaly.     Vascular: No JVD.     Trachea: No tracheal deviation.  Cardiovascular:     Rate and Rhythm: Normal rate and regular rhythm.     Heart sounds: Normal heart sounds.  Pulmonary:     Effort: No respiratory distress.     Breath sounds: No stridor. No wheezing.  Abdominal:     General: Bowel sounds are normal. There is no distension.     Palpations: Abdomen is soft. There is no mass.     Tenderness: There is no abdominal tenderness. There is no guarding or rebound.  Musculoskeletal:        General: No tenderness.     Cervical back: Normal range of motion and neck supple. No rigidity.  Lymphadenopathy:     Cervical: No cervical adenopathy.  Skin:    Findings: No erythema or rash.  Neurological:     Mental Status: She is oriented to person, place, and time.     Cranial Nerves: No cranial nerve deficit.     Motor: No abnormal muscle tone.     Coordination: Coordination normal.     Deep Tendon Reflexes: Reflexes normal.  Psychiatric:        Behavior: Behavior normal.        Thought Content: Thought content normal.        Judgment: Judgment normal.     Lab Results  Component Value Date   WBC 6.4 03/10/2022   HGB 14.0 03/10/2022   HCT 41.6 03/10/2022   PLT 206.0 03/10/2022   GLUCOSE 80 03/10/2022   CHOL 209 (H) 03/10/2022   TRIG 71.0 03/10/2022   HDL 74.10 03/10/2022   LDLDIRECT 148.2 02/03/2013   LDLCALC 120 (H) 03/10/2022   ALT 15 03/10/2022   AST 21 03/10/2022   NA 141 03/10/2022   K 4.0 03/10/2022   CL 107 03/10/2022   CREATININE 0.92 03/10/2022   BUN 18 03/10/2022   CO2 25 03/10/2022   TSH 1.12 03/10/2022   INR 1.0 10/28/2018   HGBA1C 5.7  03/10/2022   MICROALBUR 6.0 (H) 06/26/2021    VAS [place] LOWER EXT ART SEG MULTI (SEGMENTALS & LE RAYNAUDS)  Result  Date: 08/15/2021  LOWER EXTREMITY DOPPLER STUDY Patient Name:  Tiffany Gibbs  Date of Exam:   08/15/2021 Medical Rec #: [id number]               Accession #:    [id number] Date of Birth: [birth year]               Patient Gender: F Patient Age:   64 years Exam Location:  [facility] Procedure:      VAS [place] LOWER EXT ART SEG MULTI (SEGMENTALS & LE RAYNAUDS) Referring Phys: Macarthur Critchley Chanoch Mccleery --------------------------------------------------------------------------------  Indications: Patient presents with complaints of numbness of the bilateral feet              and up into the right calf, feelings of coldness in the feet and              leg cramping at night. High Risk Factors: Hypertension, Diabetes, past history of smoking.  Performing Technologist: Olegario Shearer RVT  Examination Guidelines: A complete evaluation includes at minimum, Doppler waveform signals and systolic blood pressure reading at the level of bilateral brachial, anterior tibial, and posterior tibial arteries, when vessel segments are accessible. Bilateral testing is considered an integral part of a complete examination. Photoelectric Plethysmograph (PPG) waveforms and toe systolic pressure readings are included as required and additional duplex testing as needed. Limited examinations for reoccurring indications may be performed as noted.  ABI Findings: +---------+------------------+-----+---------+--------+ Right    Rt Pressure (mmHg)IndexWaveform Comment  +---------+------------------+-----+---------+--------+ Brachial 154                                      +---------+------------------+-----+---------+--------+ CFA                             triphasic         +---------+------------------+-----+---------+--------+ Popliteal                       triphasic          +---------+------------------+-----+---------+--------+ PTA      180               1.17 triphasic         +---------+------------------+-----+---------+--------+ PERO                            biphasic          +---------+------------------+-----+---------+--------+ DP       174               1.13 triphasic         +---------+------------------+-----+---------+--------+ Great Toe116               0.75 Normal            +---------+------------------+-----+---------+--------+ +---------+------------------+-----+---------+-------+ Left     Lt Pressure (mmHg)IndexWaveform Comment +---------+------------------+-----+---------+-------+ Brachial 152                                     +---------+------------------+-----+---------+-------+ CFA                             triphasic        +---------+------------------+-----+---------+-------+ Popliteal  triphasic        +---------+------------------+-----+---------+-------+ PTA      180               1.17 triphasic        +---------+------------------+-----+---------+-------+ PERO     157               1.02 triphasic        +---------+------------------+-----+---------+-------+ DP       178               1.16 biphasic         +---------+------------------+-----+---------+-------+ Great Toe132               0.86 Normal           +---------+------------------+-----+---------+-------+ +-------+-----------+-----------+------------+------------+ ABI/TBIToday's ABIToday's TBIPrevious ABIPrevious TBI +-------+-----------+-----------+------------+------------+ Right  1.17       0.75                                +-------+-----------+-----------+------------+------------+ Left   1.1        0.86                                +-------+-----------+-----------+------------+------------+  Summary: Right: Resting right ankle-brachial index is within normal range. No evidence of  significant right lower extremity arterial disease. The right toe-brachial index is normal. Left: Resting left ankle-brachial index is within normal range. No evidence of significant left lower extremity arterial disease. The left toe-brachial index is normal. *See table(s) above for measurements and observations.  Electronically signed by Lance Muss MD on 08/15/2021 at 10:00:05 PM.    Final     Assessment & Plan:   Problem List Items Addressed This Visit     Anxiety disorder - Primary    Off Fluoxetine; Alprazolam - rare use      Hypertension, essential    On NAS diet      Memory difficulty    Rybelsus may help      B12 deficiency    Cont w/Vit B12      DM2 (diabetes mellitus, type 2) (HCC)    Will start Rybelsus      Relevant Medications   Semaglutide (RYBELSUS) 3 MG TABS   Semaglutide (RYBELSUS) 7 MG TABS   CRI (chronic renal insufficiency), stage 3 (moderate) (HCC)    F/u Dr Arrie Aran          Meds ordered this encounter  Medications   Semaglutide (RYBELSUS) 3 MG TABS    Sig: Take 1 tablet (3 mg total) by mouth daily.    Dispense:  30 tablet    Refill:  3   Semaglutide (RYBELSUS) 7 MG TABS    Sig: Take 1 tablet (7 mg total) by mouth daily. Take 30 min ac    Dispense:  30 tablet    Refill:  1      Follow-up: Return in about 3 months (around 11/06/2022) for a follow-up visit.  Sonda Primes, MD

## 2022-08-06 NOTE — Assessment & Plan Note (Signed)
On NAS diet  

## 2022-08-06 NOTE — Assessment & Plan Note (Signed)
F/u Dr Arrie Aran

## 2022-08-06 NOTE — Assessment & Plan Note (Signed)
Cont w/Vit B12 

## 2022-08-07 ENCOUNTER — Other Ambulatory Visit: Payer: Self-pay | Admitting: Internal Medicine

## 2022-08-07 DIAGNOSIS — E119 Type 2 diabetes mellitus without complications: Secondary | ICD-10-CM

## 2022-08-07 NOTE — Progress Notes (Signed)
Notified pt w/MD response. Also mailing copy to address.Marland KitchenRaechel Chute

## 2022-08-09 IMAGING — DX DG ANKLE COMPLETE 3+V*R*
3 series · 3 of 3 positions shown · non-contrast
Comparison: None.

CLINICAL DATA: Chronic right ankle pain. Localized edema. Swelling
for 5 days. No known injury.

EXAM:
RIGHT ANKLE - COMPLETE 3+ VIEW

[ankle ap]
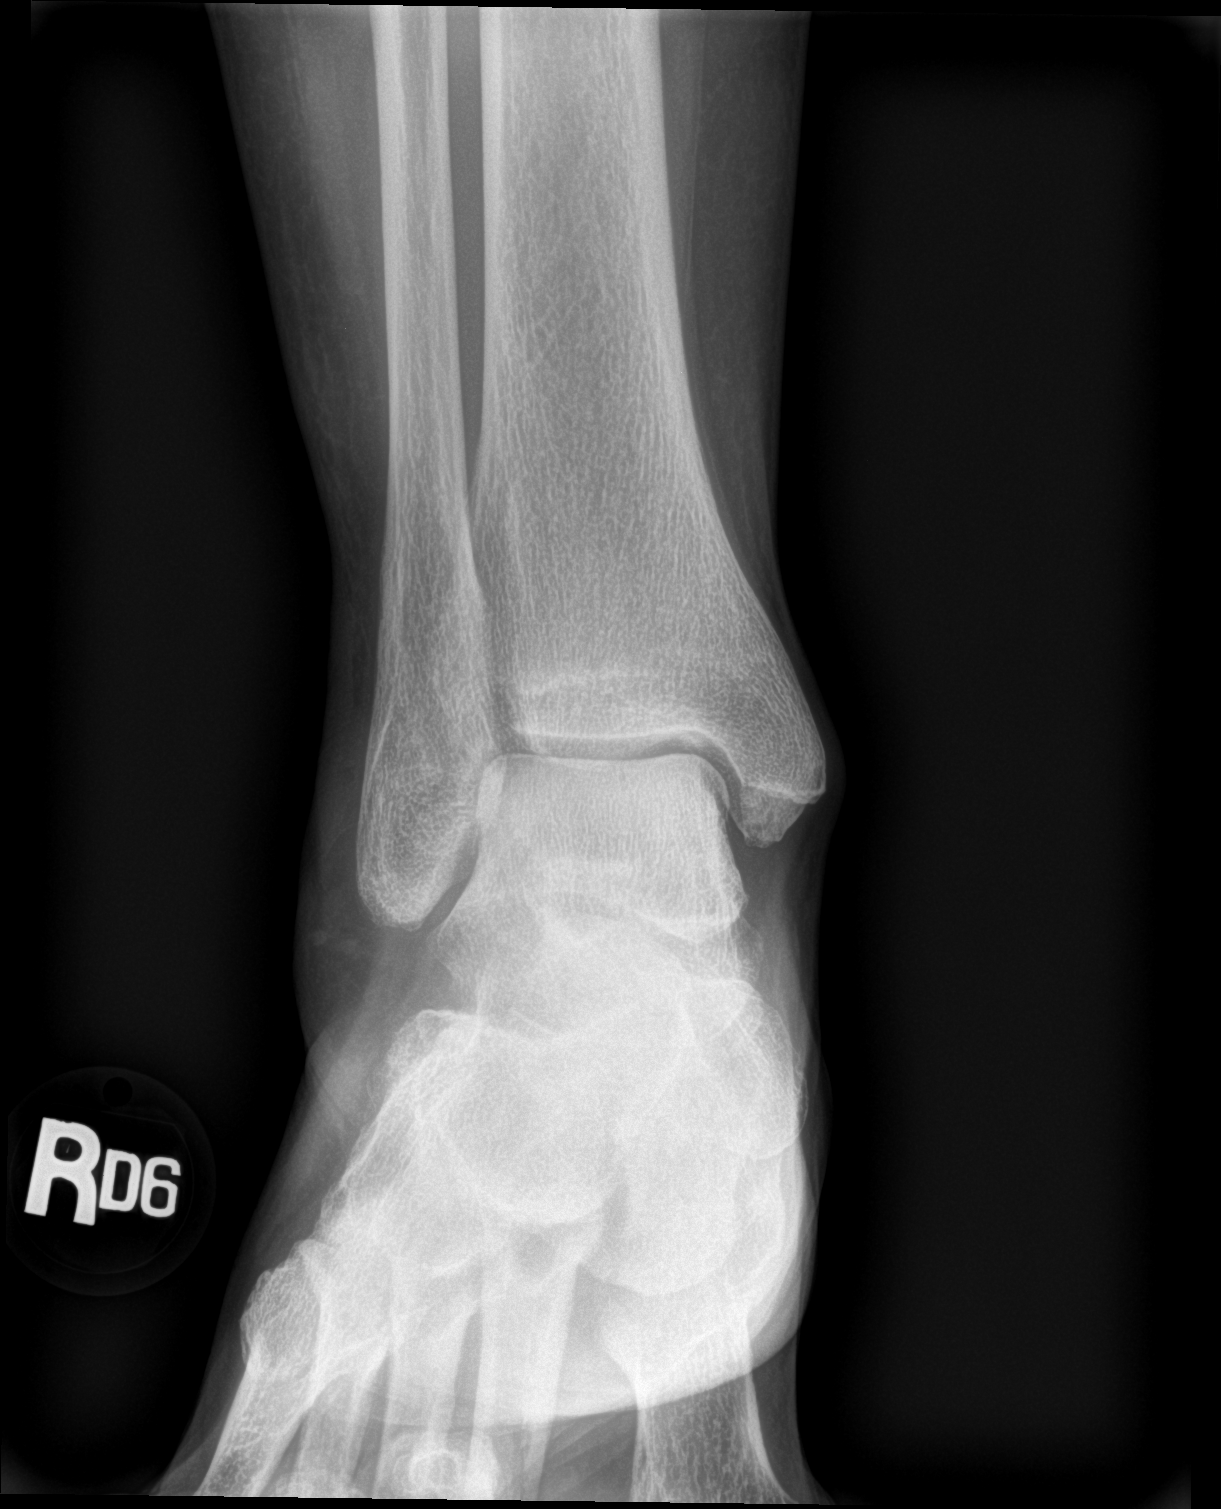

[ankle obl]
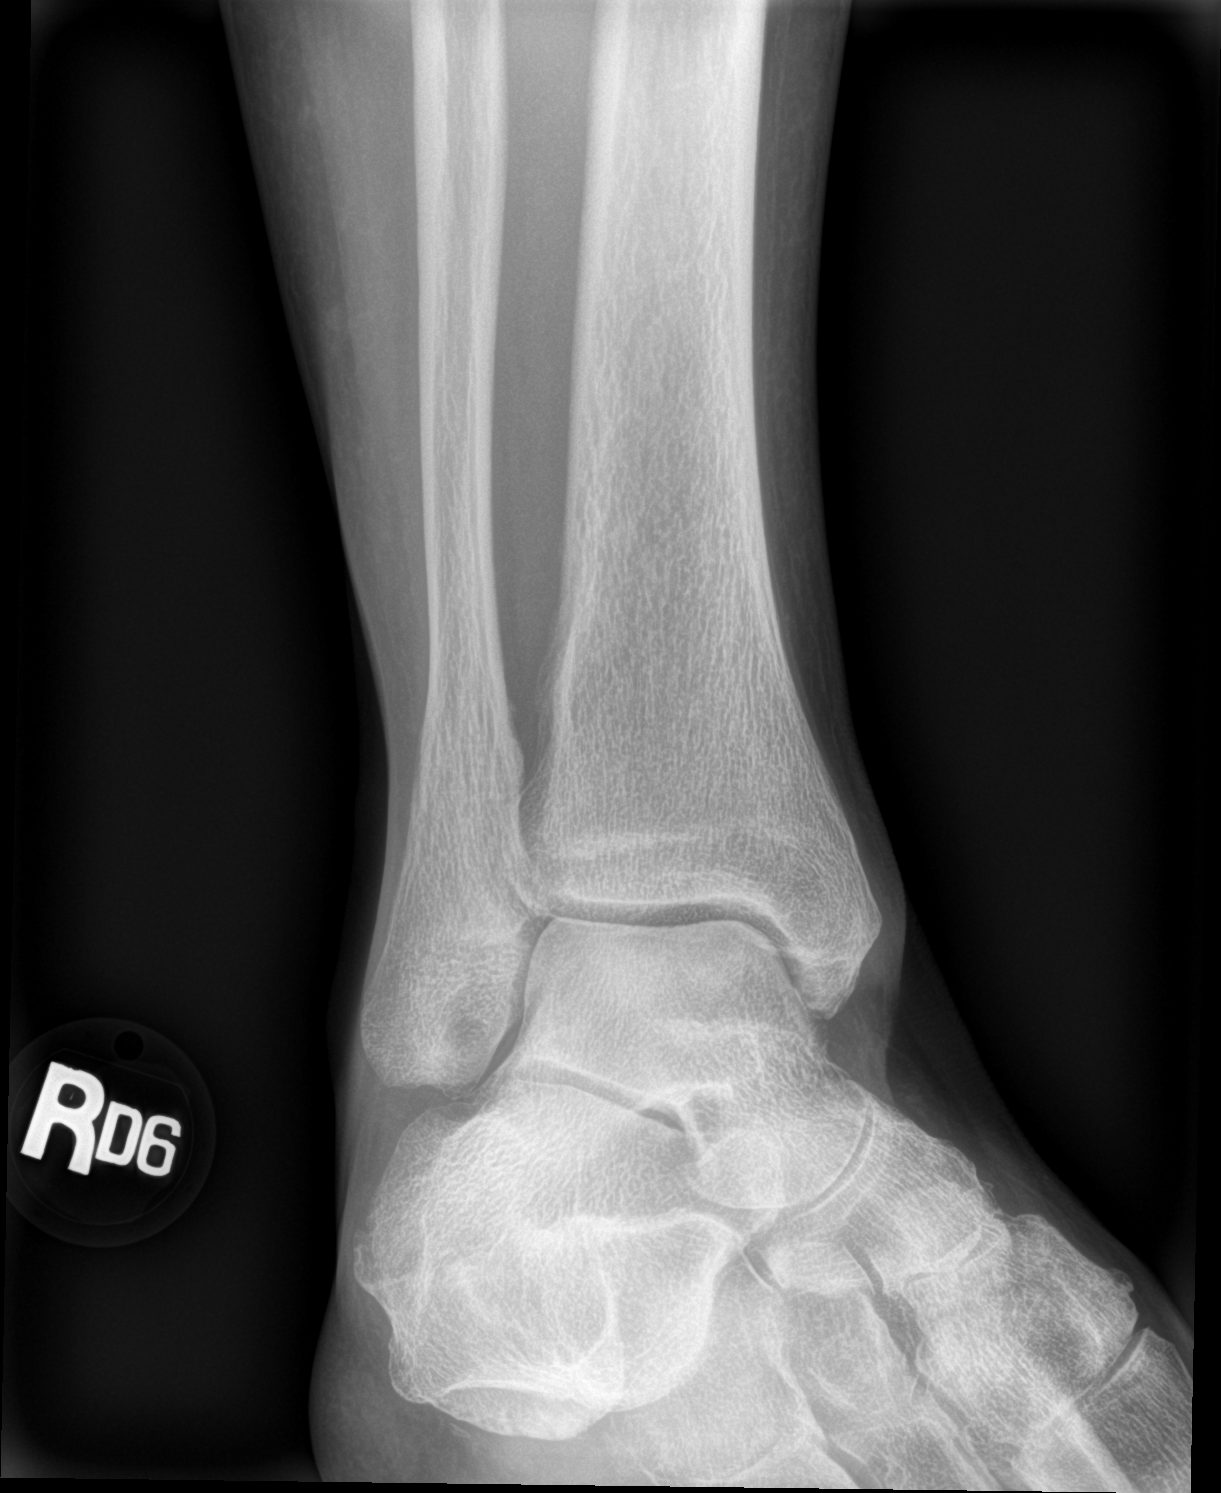

[ankle lat]
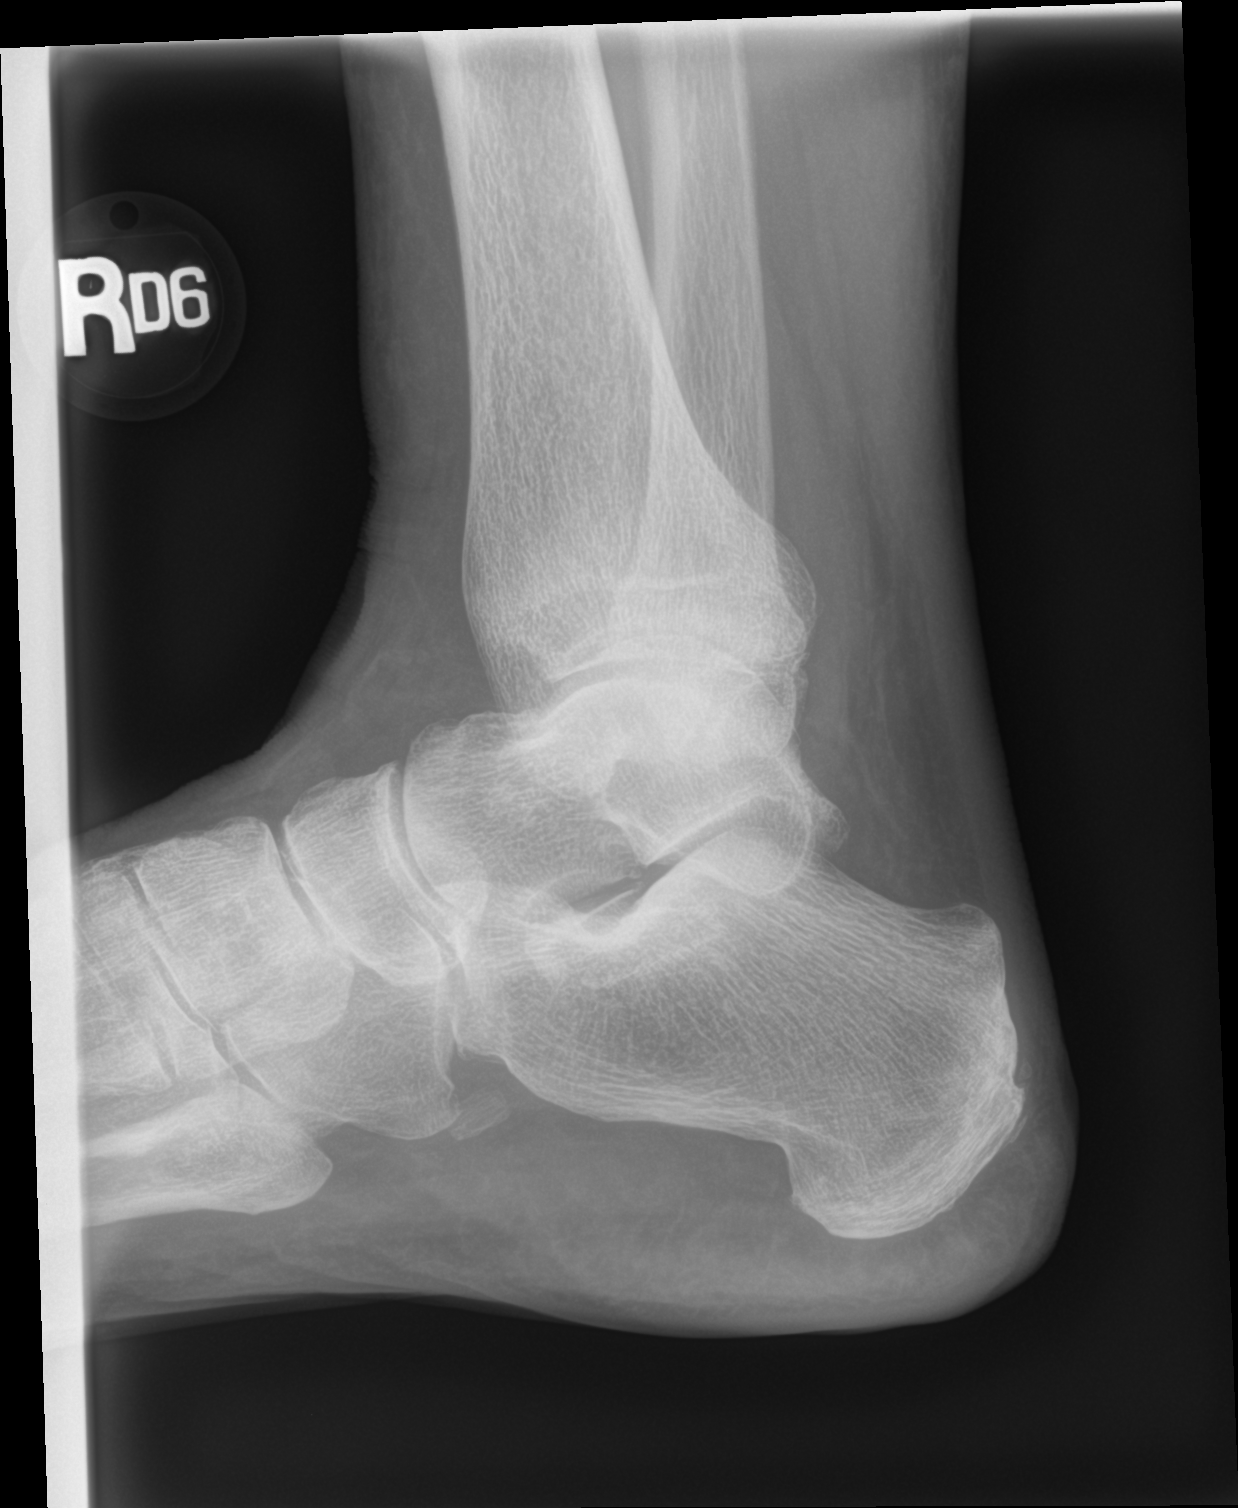

[3 of 3 positions shown; findings below may reference images not displayed]

FINDINGS: There is no evidence of fracture, dislocation, or joint effusion.
Normal hindfoot alignment and ankle mortise. Minimal talonavicular
spurring. Minimal calcaneal spurring. No erosion, periosteal
reaction, or bone destruction. No focal soft tissue abnormality.
IMPRESSION: 1. Minimal talonavicular degenerative spurring.
2. Trace calcaneal spurring.

## 2022-08-13 ENCOUNTER — Telehealth: Payer: Self-pay | Admitting: Neurology

## 2022-08-13 ENCOUNTER — Telehealth: Payer: Self-pay | Admitting: Internal Medicine

## 2022-08-13 NOTE — Telephone Encounter (Signed)
Pt returned call, stated she gained some weight since last SS in Feb 2023. She kept her old machine and has been using it recently and was needing a order for new supplies. I informed her due to the last SS not showing any significant apnea, insurance will not cover any equipment and we wouldn't be able to give a order for supplies. I advised she discuss with a local DME to see if she could pay for it out of pocket, or wait to discuss with MD during visit 11/12/22. She verbally understood and was appreciative.

## 2022-08-13 NOTE — Telephone Encounter (Signed)
Contacted pt regarding supplies, LVM per DPR, rq call back

## 2022-08-13 NOTE — Telephone Encounter (Signed)
Called pt gave her MD response on labs../lmb 

## 2022-08-13 NOTE — Telephone Encounter (Signed)
Pt asking for prescription for CPAP supplies. Have scheduled an appt to see Dr. Frances Furbish on 11/12/22. Would like a call from nurse to discuss if can make an exception to get CPAP supplies now.

## 2022-08-13 NOTE — Telephone Encounter (Signed)
Patient would like to go over lab results from 08/06/22. She would like a call back at [phone].

## 2022-08-18 DIAGNOSIS — M25551 Pain in right hip: Secondary | ICD-10-CM | POA: Diagnosis not present

## 2022-09-13 ENCOUNTER — Other Ambulatory Visit: Payer: Self-pay | Admitting: Internal Medicine

## 2022-09-17 ENCOUNTER — Telehealth: Payer: Self-pay | Admitting: Internal Medicine

## 2022-09-17 ENCOUNTER — Other Ambulatory Visit: Payer: Self-pay

## 2022-09-17 MED ORDER — ALPRAZOLAM 0.5 MG PO TABS: ORAL_TABLET | ORAL | 2 refills | Status: DC

## 2022-09-17 MED ORDER — ONETOUCH VERIO VI STRP: ORAL_STRIP | 0 refills | Status: DC

## 2022-09-17 NOTE — Telephone Encounter (Signed)
Ok Thx 

## 2022-09-17 NOTE — Telephone Encounter (Signed)
Pt called back stating she only need 2 of the medications refilled:   ALPRAZolam (XANAX) 0.5 MG tablet    ONETOUCH VERIO test strip

## 2022-09-17 NOTE — Addendum Note (Signed)
Addended by: Tresa Garter on: 09/17/2022 05:33 PM   Modules accepted: Orders

## 2022-09-17 NOTE — Telephone Encounter (Signed)
Prescription Request  09/17/2022  LOV: 08/06/2022  What is the name of the medication or equipment?  ALPRAZolam (XANAX) 0.5 MG tablet   memantine (NAMENDA) 5 MG tablet  FLUoxetine (PROZAC) 20 MG tablet  ONETOUCH VERIO test strip   Have you contacted your pharmacy to request a refill? No   Which pharmacy would you like this sent to?  [facility] [address] [place] - [address] [place] [address] Phone: [phone] Fax: [phone]    Patient notified that their request is being sent to the clinical staff for review and that they should receive a response within 2 business days.   Please advise at Mobile [phone] (mobile)

## 2022-09-22 DIAGNOSIS — M25551 Pain in right hip: Secondary | ICD-10-CM | POA: Diagnosis not present

## 2022-10-13 ENCOUNTER — Telehealth: Payer: Self-pay | Admitting: Internal Medicine

## 2022-10-13 NOTE — Telephone Encounter (Signed)
Pt  called wanting to make sure Dr. Macario Golds has her results and imaging before her upcoming appt from Dr. Turner Daniels. Can someone please let pt know if Dr. Macario Golds has this info pt is very concerned and wants Dr. Macario Golds to know everything that going on with her. Please advise.

## 2022-10-14 NOTE — Telephone Encounter (Signed)
Spoke with pt was able to inform her that we do have the papers from her Ortho office. Pt was glad to hear and had no further questions or concerns.

## 2022-10-16 ENCOUNTER — Telehealth: Payer: Self-pay | Admitting: Internal Medicine

## 2022-10-16 NOTE — Telephone Encounter (Signed)
Pt called asking if Dr.plot can send a couple of Oxycodone pills for her back until her upcoming advise pt of the guideline pt still wanted me to sen message. Pt also added she is in a lot pain.

## 2022-10-19 ENCOUNTER — Telehealth: Payer: Self-pay | Admitting: Internal Medicine

## 2022-10-19 MED ORDER — OXYCODONE-ACETAMINOPHEN 10-325 MG PO TABS: 1.0000 | ORAL_TABLET | Freq: Four times a day (QID) | ORAL | 0 refills | Status: DC | PRN

## 2022-10-19 NOTE — Telephone Encounter (Signed)
Pt mentioned that she is still in a lot of (hip pain). she tried physical therapy as you requested. That didn't work and she's has images on a cd from Dr Turner Daniels appt.  Pt mentioned she's a diabetic and can not take lots of medications. She wants to know should she set up an appointment to see you due to her current repetitive pain or do you have any other suggestions.  she's lost onto what to do moving forward and needs her dr or nurses advice. Pt conact info is [phone]

## 2022-10-19 NOTE — Telephone Encounter (Signed)
Okay.  Done.  Thanks 

## 2022-10-20 NOTE — Telephone Encounter (Signed)
Spoke with patient and offered her a sooner appointment with Dr. Macario Golds she stated that she would just wait to see him 10/24. I reassured her that he has appointments available but she insisted that she wait. Just advised her to call us if the pain becomes unbearable.

## 2022-10-21 NOTE — Telephone Encounter (Signed)
I could see her this Fri in AM  if needed Thx

## 2022-10-25 ENCOUNTER — Other Ambulatory Visit: Payer: Self-pay | Admitting: Internal Medicine

## 2022-10-25 DIAGNOSIS — E538 Deficiency of other specified B group vitamins: Secondary | ICD-10-CM

## 2022-10-25 DIAGNOSIS — E119 Type 2 diabetes mellitus without complications: Secondary | ICD-10-CM

## 2022-10-25 DIAGNOSIS — I10 Essential (primary) hypertension: Secondary | ICD-10-CM

## 2022-10-25 DIAGNOSIS — I8002 Phlebitis and thrombophlebitis of superficial vessels of left lower extremity: Secondary | ICD-10-CM

## 2022-10-25 DIAGNOSIS — F411 Generalized anxiety disorder: Secondary | ICD-10-CM

## 2022-10-25 DIAGNOSIS — J4520 Mild intermittent asthma, uncomplicated: Secondary | ICD-10-CM

## 2022-10-25 DIAGNOSIS — Z0000 Encounter for general adult medical examination without abnormal findings: Secondary | ICD-10-CM

## 2022-10-25 DIAGNOSIS — R413 Other amnesia: Secondary | ICD-10-CM

## 2022-11-04 ENCOUNTER — Other Ambulatory Visit: Payer: Self-pay | Admitting: Internal Medicine

## 2022-11-05 ENCOUNTER — Ambulatory Visit: Payer: Medicare HMO | Admitting: Internal Medicine

## 2022-11-05 ENCOUNTER — Ambulatory Visit: Payer: Medicare HMO

## 2022-11-05 ENCOUNTER — Telehealth: Payer: Self-pay | Admitting: Internal Medicine

## 2022-11-05 ENCOUNTER — Encounter: Payer: Self-pay | Admitting: Internal Medicine

## 2022-11-05 VITALS — BP 130/80 | HR 63 | Temp 98.4°F | Ht 62.0 in | Wt 162.0 lb

## 2022-11-05 DIAGNOSIS — M5440 Lumbago with sciatica, unspecified side: Secondary | ICD-10-CM

## 2022-11-05 DIAGNOSIS — Z0000 Encounter for general adult medical examination without abnormal findings: Secondary | ICD-10-CM

## 2022-11-05 DIAGNOSIS — G4733 Obstructive sleep apnea (adult) (pediatric): Secondary | ICD-10-CM | POA: Diagnosis not present

## 2022-11-05 DIAGNOSIS — E119 Type 2 diabetes mellitus without complications: Secondary | ICD-10-CM | POA: Diagnosis not present

## 2022-11-05 DIAGNOSIS — Z7985 Long-term (current) use of injectable non-insulin antidiabetic drugs: Secondary | ICD-10-CM

## 2022-11-05 DIAGNOSIS — I10 Essential (primary) hypertension: Secondary | ICD-10-CM

## 2022-11-05 DIAGNOSIS — G5701 Lesion of sciatic nerve, right lower limb: Secondary | ICD-10-CM | POA: Insufficient documentation

## 2022-11-05 LAB — COMPREHENSIVE METABOLIC PANEL
ALT: 11 U/L (ref 0–35)
AST: 15 U/L (ref 0–37)
Albumin: 4.5 g/dL (ref 3.5–5.2)
Alkaline Phosphatase: 61 U/L (ref 39–117)
BUN: 20 mg/dL (ref 6–23)
CO2: 28 meq/L (ref 19–32)
Calcium: 9.7 mg/dL (ref 8.4–10.5)
Chloride: 105 meq/L (ref 96–112)
Creatinine, Ser: 1.03 mg/dL (ref 0.40–1.20)
GFR: 52.51 mL/min — ABNORMAL LOW (ref >60.00)
Glucose, Bld: 94 mg/dL (ref 70–99)
Potassium: 3.9 meq/L (ref 3.5–5.1)
Sodium: 140 meq/L (ref 135–145)
Total Bilirubin: 0.6 mg/dL (ref 0.2–1.2)
Total Protein: 7.3 g/dL (ref 6.0–8.3)

## 2022-11-05 LAB — TSH: TSH: 1.15 u[IU]/mL (ref 0.35–5.50)

## 2022-11-05 LAB — HEMOGLOBIN A1C: Hgb A1c MFr Bld: 5.4 % (ref 4.6–6.5)

## 2022-11-05 MED ORDER — ALPRAZOLAM 1 MG PO TABS
1.0000 mg | ORAL_TABLET | Freq: Two times a day (BID) | ORAL | 2 refills | Status: DC | PRN
Start: 2022-11-05 — End: 2023-02-25

## 2022-11-05 MED ORDER — RYBELSUS 7 MG PO TABS: 1.0000 | ORAL_TABLET | Freq: Every day | ORAL | 3 refills | Status: DC

## 2022-11-05 MED ORDER — METHOCARBAMOL 500 MG PO TABS: 500.0000 mg | ORAL_TABLET | Freq: Three times a day (TID) | ORAL | 0 refills | Status: DC | PRN

## 2022-11-05 MED ORDER — OXYCODONE-ACETAMINOPHEN 10-325 MG PO TABS: 1.0000 | ORAL_TABLET | Freq: Four times a day (QID) | ORAL | 0 refills | Status: DC | PRN

## 2022-11-05 NOTE — Assessment & Plan Note (Signed)
On NAS diet  

## 2022-11-05 NOTE — Telephone Encounter (Signed)
Patient would like a copy of her lab results from today's visit mailed to her home.

## 2022-11-05 NOTE — Assessment & Plan Note (Addendum)
Use massage  Start chiropractic treatments Sit/roll on tennis ball Medrol pack if not better Robaxin prn, not w/oxy Oxy prn severe pain

## 2022-11-05 NOTE — Progress Notes (Addendum)
Subjective:   Tiffany Gibbs is a 77 y.o. female who presents for Medicare Annual (Subsequent) preventive examination.  Visit Complete: In person  Cardiac Risk Factors include: advanced age (>8men, >46 women);sedentary lifestyle;dyslipidemia;family history of premature cardiovascular disease;hypertension     Objective:    Today's Vitals   11/05/22 1334  BP: 130/80  Pulse: 63  Temp: 98.4 F (36.9 C)  TempSrc: Temporal  SpO2: 98%  Weight: 162 lb (73.5 kg)  Height: 5\' 2"  (1.575 m)  PainSc: 0-No pain   Body mass index is 29.63 kg/m.     11/05/2022    2:15 PM 04/01/2021    1:59 PM 08/14/2020    9:25 AM 08/08/2020   10:56 AM 03/18/2020    1:17 PM 01/06/2020   11:41 AM 11/01/2018    1:09 PM  Advanced Directives  Does Patient Have a Medical Advance Directive? Yes Yes Yes No;Yes Yes Yes Yes  Type of Estate agent of [place];Living will Living will;Healthcare Power of Attorney Living will;Healthcare Power of [employer] Power of [place];Living will Living will;Healthcare Power of [employer] Power of [place];Living will  Does patient want to make changes to medical advance directive?  No - Patient declined No - Patient declined No - Patient declined No - Patient declined  No - Patient declined  Copy of Healthcare Power of Attorney in Chart? No - copy requested No - copy requested Yes - validated most recent copy scanned in chart (See row information)  No - copy requested  No - copy requested  Would patient like information on creating a medical advance directive?   No - Patient declined No - Patient declined       Current Medications (verified) Outpatient Encounter Medications as of 11/05/2022  Medication Sig   ALPRAZolam (XANAX) 0.5 MG tablet TAKE 1 TO 2 TABLETS BY MOUTH TWICE DAILY AS NEEDED FOR ANXIETY AND FOR INSOMNIA   Blood Glucose Monitoring Suppl (ONETOUCH VERIO) w/Device KIT Use to check blood sugar daily. DX E11.9    Cholecalciferol (VITAMIN D3) 50 MCG (2000 UT) CAPS Take 1 capsule (2,000 Units total) by mouth daily.   Cyanocobalamin (B-12) 5000 MCG CAPS Take 1 tablet by mouth once a week.    diphenhydrAMINE (BENADRYL) 25 mg capsule Take 25 mg by mouth daily as needed (allergies).    FLUoxetine (PROZAC) 20 MG capsule Take 20 mg by mouth daily.   FLUoxetine (PROZAC) 20 MG tablet TAKE 1 TO 2 TABLETS BY MOUTH ONCE DAILY   fluticasone (FLONASE) 50 MCG/ACT nasal spray Place 2 sprays into both nostrils daily.   glucose blood (ONETOUCH VERIO) test strip USE 1 STRIP TO CHECK GLUCOSE TWICE DAILY AS NEEDED   Lancets (ONETOUCH DELICA PLUS LANCET33G) MISC USE 1 LANCET TO CHECK GLUCOSE IN THE MORNING AND AFTERNOON   loratadine (CLARITIN) 10 MG tablet Take 1 tablet (10 mg total) by mouth daily.   memantine (NAMENDA) 5 MG tablet Take 1 tablet (5 mg total) by mouth 2 (two) times daily.   metFORMIN (GLUCOPHAGE) 500 MG tablet TAKE 1 TABLET BY MOUTH TWICE DAILY WITH MEALS   Omega-3 Fatty Acids (FISH OIL) 1200 MG CAPS Take 1 capsule by mouth in the morning and at bedtime.   oxyCODONE-acetaminophen (PERCOCET) 10-325 MG tablet Take 1 tablet by mouth every 6 (six) hours as needed for pain.   potassium chloride (KLOR-CON) 8 MEQ tablet Take 1 tablet by mouth twice daily   Semaglutide (RYBELSUS) 3 MG TABS Take 1 tablet (3 mg  total) by mouth daily.   Semaglutide (RYBELSUS) 7 MG TABS Take 1 tablet (7 mg total) by mouth daily. Take 30 min ac   Turmeric Curcumin 500 MG CAPS Take 1 capsule in the morning and 2 at bedtime   magnesium oxide (MAG-OX) 400 MG tablet Take 400 mg by mouth daily. (Patient not taking: Reported on 11/05/2022)   Probiotic Product (ALIGN) 4 MG CAPS Take 1 capsule (4 mg total) by mouth daily. (Patient not taking: Reported on 11/05/2022)   No facility-administered encounter medications on file as of 11/05/2022.    Allergies (verified) Aricept [donepezil], Celecoxib, Diazepam, Enalapril, Fluoxetine, Gabapentin,  Losartan, Lovastatin, Lyrica [pregabalin], Naproxen, Pravachol [pravastatin sodium], and Zetia [ezetimibe]   History: Past Medical History:  Diagnosis Date   Anxiety    Cataract    Depression    DM type 2 (diabetes mellitus, type 2) (HCC)    Fibromyalgia    H/O dizziness    Hives    from tomatoes   HTN (hypertension)    Hyperlipidemia    Internal hemorrhoids    Kidney disease    Low back pain    OSA (obstructive sleep apnea)    uses c-pap   Osteoarthritis    Osteoporosis    SOB (shortness of breath) on exertion    Tubular adenoma of colon    Past Surgical History:  Procedure Laterality Date   cervical cryotherapy     cervix   CYSTOCELE REPAIR N/A 11/01/2018   Procedure: CYSTOSCOPY ANTERIOR REPAIR (CYSTOCELE);  Surgeon: Alfredo Martinez, MD;  Location: [facility];  Service: Urology;  Laterality: N/A;   DRUG INDUCED ENDOSCOPY N/A 08/14/2020   Procedure: DRUG INDUCED ENDOSCOPY;  Surgeon: Osborn Coho, MD;  Location: [facility];  Service: ENT;  Laterality: N/A;   EYE SURGERY     Cataracts/Bil   LASIK Bilateral    TOTAL ABDOMINAL HYSTERECTOMY     TOTAL HIP ARTHROPLASTY  09/2008   right- [place]/   TOTAL HIP ARTHROPLASTY Left 11/28/2012   Procedure: TOTAL HIP ARTHROPLASTY;  Surgeon: Nestor Lewandowsky, MD;  Location: [facility];  Service: Orthopedics;  Laterality: Left;   Family History  Problem Relation Age of Onset   Hypertension Mother    Diabetes Mother    Other Mother        DVT   Heart disease Father    Multiple sclerosis Father    Heart attack Father    Kidney disease Brother 69       ESRD   Social History   Socioeconomic History   Marital status: Married    Spouse name: Sam   Number of children: 1   Years of education: 12    Highest education level: Not on file  Occupational History   Occupation: N/A  Tobacco Use   Smoking status: Former    Current packs/day: 0.00    Average packs/day: 2.0 packs/day for 25.0 years (50.0 ttl pk-yrs)    Types:  Cigarettes    Start date: 01/12/1958    Quit date: 01/13/1983    Years since quitting: 39.8   Smokeless tobacco: Never  Vaping Use   Vaping status: Never Used  Substance and Sexual Activity   Alcohol use: No    Alcohol/week: 0.0 standard drinks of alcohol   Drug use: No   Sexual activity: Not Currently    Birth control/protection: Post-menopausal, Surgical  Other Topics Concern   Not on file  Social History Narrative   Was divorced - newly re-married as of  2009, Sam   1 son      Caffeine use- coffee 1 cup daily    Social Determinants of Health   Financial Resource Strain: Low Risk  (11/05/2022)   Overall Financial Resource Strain (CARDIA)    Difficulty of Paying Living Expenses: Not hard at all  Food Insecurity: No Food Insecurity (11/05/2022)   Hunger Vital Sign    Worried About Running Out of Food in the Last Year: Never true    Ran Out of Food in the Last Year: Never true  Transportation Needs: No Transportation Needs (11/05/2022)   PRAPARE - Administrator, Civil Service (Medical): No    Lack of Transportation (Non-Medical): No  Physical Activity: Inactive (11/05/2022)   Exercise Vital Sign    Days of Exercise per Week: 0 days    Minutes of Exercise per Session: 0 min  Stress: No Stress Concern Present (11/05/2022)   [employer] of Occupational Health - Occupational Stress Questionnaire    Feeling of Stress : Not at all  Social Connections: Socially Integrated (11/05/2022)   Social Connection and Isolation Panel [NHANES]    Frequency of Communication with Friends and Family: More than three times a week    Frequency of Social Gatherings with Friends and Family: More than three times a week    Attends Religious Services: More than 4 times per year    Active Member of [employer] or Organizations: Yes    Attends Engineer, structural: More than 4 times per year    Marital Status: Married    Tobacco Counseling Counseling given: Not  Answered   Clinical Intake:  Pre-visit preparation completed: Yes  Pain : 0-10 Pain Score: 0-No pain Pain Type: Acute pain, Chronic pain, Neuropathic pain     Nutritional Risks: None Diabetes: No  How often do you need to have someone help you when you read instructions, pamphlets, or other written materials from your doctor or pharmacy?: 1 - Never What is the last grade level you completed in school?: HSG  Interpreter Needed?: No  Information entered by :: Susie Cassette, LPN.   Activities of Daily Living    11/05/2022    2:19 PM  In your present state of health, do you have any difficulty performing the following activities:  Hearing? 1  Comment possible wax buildup  Vision? 0  Difficulty concentrating or making decisions? 0  Walking or climbing stairs? 0  Dressing or bathing? 0  Doing errands, shopping? 0  Preparing Food and eating ? N  Using the Toilet? N  In the past six months, have you accidently leaked urine? N  Do you have problems with loss of bowel control? N  Managing your Medications? N  Managing your Finances? N  Housekeeping or managing your Housekeeping? N    Patient Care Team: [name], Georgina Quint, MD as PCP - General Penumalli, Glenford Bayley, MD as Consulting Physician (Neurology) Gean Birchwood, MD as Consulting Physician (Orthopedic Surgery) Huston Foley, MD as Attending Physician (Neurology) Arnetha Gula Lucious Groves, MD as Referring Physician (Ophthalmology) [facility] as Consulting Physician (Ophthalmology)  Indicate any recent Medical Services you may have received from other than [facility] providers in the past year (date may be approximate).     Assessment:   This is a routine wellness examination for Starlit.  Hearing/Vision screen Hearing Screening - Comments:: Denies hearing difficulties; no hearing aids.   Vision Screening - Comments:: Patient wears otc readers; eye exam dome by [facility] in  [place], [place]   Goals Addressed              This Visit's Progress    Weight < 145 lb (65.8 kg)       I want to lose weight and be around 145-147 pounds.      Depression Screen    11/05/2022    2:14 PM 06/25/2022    1:51 PM 03/10/2022    3:23 PM 10/06/2021    1:33 PM 07/02/2021    1:31 PM 04/02/2021    1:18 PM 04/01/2021   12:45 PM  PHQ 2/9 Scores  PHQ - 2 Score 0 0 0 4 0 0 0  PHQ- 9 Score 1 1 3 13  0      Fall Risk    11/05/2022    2:19 PM 06/25/2022    1:51 PM 03/10/2022    3:23 PM 10/06/2021    1:33 PM 07/02/2021    1:30 PM  Fall Risk   Falls in the past year? 0 0 0 0 0  Number falls in past yr: 0 0 0 0 0  Injury with Fall? 0 0 0 0 0  Risk for fall due to : No Fall Risks No Fall Risks No Fall Risks No Fall Risks No Fall Risks  Follow up Falls prevention discussed Falls evaluation completed Falls evaluation completed  Falls evaluation completed    MEDICARE RISK AT HOME: Medicare Risk at Home Any stairs in or around the home?: Yes If so, are there any without handrails?: No Home free of loose throw rugs in walkways, pet beds, electrical cords, etc?: Yes Adequate lighting in your home to reduce risk of falls?: Yes Life alert?: No Use of a cane, walker or w/c?: No Grab bars in the bathroom?: Yes Shower chair or bench in shower?: Yes Elevated toilet seat or a handicapped toilet?: Yes  TIMED UP AND GO:  Was the test performed?  Yes  Length of time to ambulate 10 feet: 8 sec Gait steady and fast without use of assistive device    Cognitive Function: Normal cognitive status assessed by direct observation by this Nurse Health Advisor. No abnormalities found.      11/05/2022    2:20 PM 11/19/2014    1:08 PM  MMSE - Mini Mental State Exam  Not completed:  --  Orientation to time 5   Orientation to Place 5   Registration 3   Attention/ Calculation 5   Recall 3   Language- name 2 objects 2   Language- repeat 1   Language- follow 3 step command 3   Language- read & follow direction 1   Write a  sentence 1   Copy design 1   Total score 30         11/05/2022    2:20 PM  6CIT Screen  What Year? 0 points  What month? 0 points  What time? 0 points  Count back from 20 0 points  Months in reverse 0 points  Repeat phrase 0 points  Total Score 0 points    Immunizations Immunization History  Administered Date(s) Administered   Fluad Quad(high Dose 65+) 09/14/2018, 11/20/2019, 10/06/2021   Influenza Split 11/03/2010, 12/30/2011   Influenza Whole 10/16/2008, 10/08/2009   Influenza, High Dose Seasonal PF 09/25/2015, 10/01/2016, 10/13/2017   Influenza,inj,Quad PF,6+ Mos 10/13/2012, 08/30/2013, 10/15/2014   Influenza-Unspecified 11/01/2020   Moderna Covid-19 Vaccine Bivalent Booster 42yrs & up 11/01/2020   Moderna SARS-COV2 Booster Vaccination 03/06/2020   Moderna Sars-Covid-2 Vaccination  02/03/2019, 03/05/2019, 09/07/2019   Pneumococcal Conjugate-13 08/30/2013   Pneumococcal Polysaccharide-23 11/03/2010, 05/30/2020   Td 02/25/2011   Zoster Recombinant(Shingrix) 06/23/2016, 10/04/2016, 12/19/2019    TDAP status: Due, Education has been provided regarding the importance of this vaccine. Advised may receive this vaccine at local pharmacy or Health Dept. Aware to provide a copy of the vaccination record if obtained from local pharmacy or Health Dept. Verbalized acceptance and understanding.  Flu Vaccine status: Due, Education has been provided regarding the importance of this vaccine. Advised may receive this vaccine at local pharmacy or Health Dept. Aware to provide a copy of the vaccination record if obtained from local pharmacy or Health Dept. Verbalized acceptance and understanding.  Pneumococcal vaccine status: Up to date  Covid-19 vaccine status: Completed vaccines  Qualifies for Shingles Vaccine? Yes   Zostavax completed No   Shingrix Completed?: Yes  Screening Tests Health Maintenance  Topic Date Due   OPHTHALMOLOGY EXAM  05/29/2018   FOOT EXAM  05/12/2019    Colonoscopy  09/08/2020   DTaP/Tdap/Td (2 - Tdap) 02/24/2021   INFLUENZA VACCINE  08/13/2022   COVID-19 Vaccine (5 - 2023-24 season) 09/13/2022   HEMOGLOBIN A1C  02/06/2023   Diabetic kidney evaluation - Urine ACR  05/21/2023   Diabetic kidney evaluation - eGFR measurement  08/06/2023   Medicare Annual Wellness (AWV)  11/05/2023   Pneumonia Vaccine 31+ Years old  Completed   DEXA SCAN  Completed   Hepatitis C Screening  Completed   Zoster Vaccines- Shingrix  Completed   HPV VACCINES  Aged Out    Health Maintenance  Health Maintenance Due  Topic Date Due   OPHTHALMOLOGY EXAM  05/29/2018   FOOT EXAM  05/12/2019   Colonoscopy  09/08/2020   DTaP/Tdap/Td (2 - Tdap) 02/24/2021   INFLUENZA VACCINE  08/13/2022   COVID-19 Vaccine (5 - 2023-24 season) 09/13/2022    Colorectal cancer screening: Type of screening: Colonoscopy. Completed 09/09/2015. Repeat every 5 years  Mammogram status: Completed 12/08/2021. Repeat every year  Bone Density status: Completed 09/25/2015. Results reflect: Bone density results: OSTEOPOROSIS. Repeat every 2 years.  Lung Cancer Screening: (Low Dose CT Chest recommended if Age 73-80 years, 20 pack-year currently smoking OR have quit w/in 15years.) does not qualify.   Lung Cancer Screening Referral: no  Additional Screening:  Hepatitis C Screening: does qualify; Completed 06/06/2019  Vision Screening: Recommended annual ophthalmology exams for early detection of glaucoma and other disorders of the eye. Is the patient up to date with their annual eye exam?  Yes  Who is the provider or what is the name of the office in which the patient attends annual eye exams? [facility] If pt is not established with a provider, would they like to be referred to a provider to establish care? No .   Dental Screening: Recommended annual dental exams for proper oral hygiene  Diabetic Foot Exam: Diabetic Foot Exam: Overdue, Pt has been advised about the importance in  completing this exam. Pt is scheduled for diabetic foot exam on 11/05/2022.  Community Resource Referral / Chronic Care Management: CRR required this visit?  No   CCM required this visit?  No     Plan:     I have personally reviewed and noted the following in the patient's chart:   Medical and social history Use of alcohol, tobacco or illicit drugs  Current medications and supplements including opioid prescriptions. Patient is currently taking opioid prescriptions. Information provided to patient regarding non-opioid alternatives. Patient advised  to discuss non-opioid treatment plan with their provider. Functional ability and status Nutritional status Physical activity Advanced directives List of other physicians Hospitalizations, surgeries, and ER visits in previous 12 months Vitals Screenings to include cognitive, depression, and falls Referrals and appointments  In addition, I have reviewed and discussed with patient certain preventive protocols, quality metrics, and best practice recommendations. A written personalized care plan for preventive services as well as general preventive health recommendations were provided to patient.     Mickeal Needy, LPN   [id number]   After Visit Summary: (In Person-Printed) AVS printed and given to the patient  Nurse Notes: n/a     Medical screening examination/treatment/procedure(s) were performed by non-physician practitioner and as supervising physician I was immediately available for consultation/collaboration.  I agree with above. Jacinta Shoe, MD

## 2022-11-05 NOTE — Assessment & Plan Note (Signed)
Using a chin strap at home now

## 2022-11-05 NOTE — Progress Notes (Signed)
MICROALBUR 6.0 (H) 06/26/2021    VAS [place] LOWER EXT ART SEG MULTI (SEGMENTALS & LE RAYNAUDS)  Result Date: 08/15/2021  LOWER EXTREMITY DOPPLER STUDY Patient Name:  Tiffany Gibbs  Date of Exam:   08/15/2021 Medical Rec #: [id number]               Accession #:    [id number] Date of Birth: July 30, 1945               Patient Gender: F Patient Age:   77 years Exam Location:  [facility] Procedure:      VAS [place] LOWER EXT ART SEG MULTI (SEGMENTALS & LE RAYNAUDS) Referring Phys: Macarthur Critchley Prabhjot Piscitello --------------------------------------------------------------------------------  Indications: Patient presents with complaints of numbness of the bilateral feet              and up into the right calf, feelings of coldness in the feet and              leg cramping at night. High Risk Factors: Hypertension, Diabetes, past history of smoking.  Performing Technologist: Olegario Shearer RVT  Examination Guidelines: A complete evaluation includes at minimum, Doppler waveform signals and systolic blood pressure reading at the level of bilateral brachial, anterior tibial, and posterior tibial arteries, when vessel segments are accessible. Bilateral testing is considered an integral part of a complete examination. Photoelectric Plethysmograph (PPG) waveforms and toe systolic pressure readings are included as required and additional duplex testing as needed. Limited examinations for reoccurring indications may be performed as noted.  ABI Findings: +---------+------------------+-----+---------+--------+ Right    Rt Pressure (mmHg)IndexWaveform Comment  +---------+------------------+-----+---------+--------+ Brachial 154                                      +---------+------------------+-----+---------+--------+ CFA                             triphasic         +---------+------------------+-----+---------+--------+  Popliteal                       triphasic         +---------+------------------+-----+---------+--------+ PTA      180               1.17 triphasic         +---------+------------------+-----+---------+--------+ PERO                            biphasic          +---------+------------------+-----+---------+--------+ DP       174               1.13 triphasic         +---------+------------------+-----+---------+--------+ Great Toe116               0.75 Normal            +---------+------------------+-----+---------+--------+ +---------+------------------+-----+---------+-------+ Left     Lt Pressure (mmHg)IndexWaveform Comment +---------+------------------+-----+---------+-------+ Brachial 152                                     +---------+------------------+-----+---------+-------+ CFA  Subjective:  Patient ID: Tiffany Gibbs, female    DOB: 03/19/1945  Age: 77 y.o. MRN: [id number]  CC: Medical Management of Chronic Issues (3 mnth f/u)   HPI Tiffany Gibbs presents for severe LBP, R hip pain. LS MRI was OK. Pt saw Dr Turner Daniels Pt has to take Oxycodone at hs to sleep Driving a lot to [place]  Outpatient Medications Prior to Visit  Medication Sig Dispense Refill   Blood Glucose Monitoring Suppl (ONETOUCH VERIO) w/Device KIT Use to check blood sugar daily. DX E11.9 1 kit 0   Cholecalciferol (VITAMIN D3) 50 MCG (2000 UT) CAPS Take 1 capsule (2,000 Units total) by mouth daily. 100 capsule 3   Cyanocobalamin (B-12) 5000 MCG CAPS Take 1 tablet by mouth once a week.      diphenhydrAMINE (BENADRYL) 25 mg capsule Take 25 mg by mouth daily as needed (allergies).      FLUoxetine (PROZAC) 20 MG tablet TAKE 1 TO 2 TABLETS BY MOUTH ONCE DAILY 60 tablet 5   fluticasone (FLONASE) 50 MCG/ACT nasal spray Place 2 sprays into both nostrils daily. 16 g 6   glucose blood (ONETOUCH VERIO) test strip USE 1 STRIP TO CHECK GLUCOSE TWICE DAILY AS NEEDED 200 each 0   Lancets (ONETOUCH DELICA PLUS LANCET33G) MISC USE 1 LANCET TO CHECK GLUCOSE IN THE MORNING AND AFTERNOON 200 each 1   loratadine (CLARITIN) 10 MG tablet Take 1 tablet (10 mg total) by mouth daily. 30 tablet 11   memantine (NAMENDA) 5 MG tablet Take 1 tablet (5 mg total) by mouth 2 (two) times daily. 60 tablet 5   metFORMIN (GLUCOPHAGE) 500 MG tablet TAKE 1 TABLET BY MOUTH TWICE DAILY WITH MEALS 180 tablet 3   Omega-3 Fatty Acids (FISH OIL) 1200 MG CAPS Take 1 capsule by mouth in the morning and at bedtime.     potassium chloride (KLOR-CON) 8 MEQ tablet Take 1 tablet by mouth twice daily 180 tablet 0   Turmeric Curcumin 500 MG CAPS Take 1 capsule in the morning and 2 at bedtime     ALPRAZolam (XANAX) 0.5 MG tablet TAKE 1 TO 2 TABLETS BY MOUTH TWICE DAILY AS NEEDED FOR ANXIETY AND FOR INSOMNIA 60 tablet 2   FLUoxetine (PROZAC)  20 MG capsule Take 20 mg by mouth daily.     oxyCODONE-acetaminophen (PERCOCET) 10-325 MG tablet Take 1 tablet by mouth every 6 (six) hours as needed for pain. 10 tablet 0   Semaglutide (RYBELSUS) 3 MG TABS Take 1 tablet (3 mg total) by mouth daily. 30 tablet 3   Semaglutide (RYBELSUS) 7 MG TABS Take 1 tablet (7 mg total) by mouth daily. Take 30 min ac 30 tablet 1   magnesium oxide (MAG-OX) 400 MG tablet Take 400 mg by mouth daily. (Patient not taking: Reported on 11/05/2022)     Probiotic Product (ALIGN) 4 MG CAPS Take 1 capsule (4 mg total) by mouth daily. (Patient not taking: Reported on 11/05/2022) 30 capsule 0   No facility-administered medications prior to visit.    ROS: Review of Systems  Constitutional:  Negative for activity change, appetite change, chills, fatigue and unexpected weight change.  HENT:  Negative for congestion, mouth sores and sinus pressure.   Eyes:  Negative for visual disturbance.  Respiratory:  Negative for cough and chest tightness.   Gastrointestinal:  Negative for abdominal pain and nausea.  Genitourinary:  Negative for difficulty urinating, frequency and vaginal pain.  Musculoskeletal:  Positive for arthralgias and back pain. Negative  MICROALBUR 6.0 (H) 06/26/2021    VAS [place] LOWER EXT ART SEG MULTI (SEGMENTALS & LE RAYNAUDS)  Result Date: 08/15/2021  LOWER EXTREMITY DOPPLER STUDY Patient Name:  Tiffany Gibbs  Date of Exam:   08/15/2021 Medical Rec #: [id number]               Accession #:    [id number] Date of Birth: July 30, 1945               Patient Gender: F Patient Age:   77 years Exam Location:  [facility] Procedure:      VAS [place] LOWER EXT ART SEG MULTI (SEGMENTALS & LE RAYNAUDS) Referring Phys: Macarthur Critchley Prabhjot Piscitello --------------------------------------------------------------------------------  Indications: Patient presents with complaints of numbness of the bilateral feet              and up into the right calf, feelings of coldness in the feet and              leg cramping at night. High Risk Factors: Hypertension, Diabetes, past history of smoking.  Performing Technologist: Olegario Shearer RVT  Examination Guidelines: A complete evaluation includes at minimum, Doppler waveform signals and systolic blood pressure reading at the level of bilateral brachial, anterior tibial, and posterior tibial arteries, when vessel segments are accessible. Bilateral testing is considered an integral part of a complete examination. Photoelectric Plethysmograph (PPG) waveforms and toe systolic pressure readings are included as required and additional duplex testing as needed. Limited examinations for reoccurring indications may be performed as noted.  ABI Findings: +---------+------------------+-----+---------+--------+ Right    Rt Pressure (mmHg)IndexWaveform Comment  +---------+------------------+-----+---------+--------+ Brachial 154                                      +---------+------------------+-----+---------+--------+ CFA                             triphasic         +---------+------------------+-----+---------+--------+  Popliteal                       triphasic         +---------+------------------+-----+---------+--------+ PTA      180               1.17 triphasic         +---------+------------------+-----+---------+--------+ PERO                            biphasic          +---------+------------------+-----+---------+--------+ DP       174               1.13 triphasic         +---------+------------------+-----+---------+--------+ Great Toe116               0.75 Normal            +---------+------------------+-----+---------+--------+ +---------+------------------+-----+---------+-------+ Left     Lt Pressure (mmHg)IndexWaveform Comment +---------+------------------+-----+---------+-------+ Brachial 152                                     +---------+------------------+-----+---------+-------+ CFA  MICROALBUR 6.0 (H) 06/26/2021    VAS [place] LOWER EXT ART SEG MULTI (SEGMENTALS & LE RAYNAUDS)  Result Date: 08/15/2021  LOWER EXTREMITY DOPPLER STUDY Patient Name:  Tiffany Gibbs  Date of Exam:   08/15/2021 Medical Rec #: [id number]               Accession #:    [id number] Date of Birth: July 30, 1945               Patient Gender: F Patient Age:   77 years Exam Location:  [facility] Procedure:      VAS [place] LOWER EXT ART SEG MULTI (SEGMENTALS & LE RAYNAUDS) Referring Phys: Macarthur Critchley Prabhjot Piscitello --------------------------------------------------------------------------------  Indications: Patient presents with complaints of numbness of the bilateral feet              and up into the right calf, feelings of coldness in the feet and              leg cramping at night. High Risk Factors: Hypertension, Diabetes, past history of smoking.  Performing Technologist: Olegario Shearer RVT  Examination Guidelines: A complete evaluation includes at minimum, Doppler waveform signals and systolic blood pressure reading at the level of bilateral brachial, anterior tibial, and posterior tibial arteries, when vessel segments are accessible. Bilateral testing is considered an integral part of a complete examination. Photoelectric Plethysmograph (PPG) waveforms and toe systolic pressure readings are included as required and additional duplex testing as needed. Limited examinations for reoccurring indications may be performed as noted.  ABI Findings: +---------+------------------+-----+---------+--------+ Right    Rt Pressure (mmHg)IndexWaveform Comment  +---------+------------------+-----+---------+--------+ Brachial 154                                      +---------+------------------+-----+---------+--------+ CFA                             triphasic         +---------+------------------+-----+---------+--------+  Popliteal                       triphasic         +---------+------------------+-----+---------+--------+ PTA      180               1.17 triphasic         +---------+------------------+-----+---------+--------+ PERO                            biphasic          +---------+------------------+-----+---------+--------+ DP       174               1.13 triphasic         +---------+------------------+-----+---------+--------+ Great Toe116               0.75 Normal            +---------+------------------+-----+---------+--------+ +---------+------------------+-----+---------+-------+ Left     Lt Pressure (mmHg)IndexWaveform Comment +---------+------------------+-----+---------+-------+ Brachial 152                                     +---------+------------------+-----+---------+-------+ CFA  Subjective:  Patient ID: Tiffany Gibbs, female    DOB: 03/19/1945  Age: 77 y.o. MRN: [id number]  CC: Medical Management of Chronic Issues (3 mnth f/u)   HPI Tiffany Gibbs presents for severe LBP, R hip pain. LS MRI was OK. Pt saw Dr Turner Daniels Pt has to take Oxycodone at hs to sleep Driving a lot to [place]  Outpatient Medications Prior to Visit  Medication Sig Dispense Refill   Blood Glucose Monitoring Suppl (ONETOUCH VERIO) w/Device KIT Use to check blood sugar daily. DX E11.9 1 kit 0   Cholecalciferol (VITAMIN D3) 50 MCG (2000 UT) CAPS Take 1 capsule (2,000 Units total) by mouth daily. 100 capsule 3   Cyanocobalamin (B-12) 5000 MCG CAPS Take 1 tablet by mouth once a week.      diphenhydrAMINE (BENADRYL) 25 mg capsule Take 25 mg by mouth daily as needed (allergies).      FLUoxetine (PROZAC) 20 MG tablet TAKE 1 TO 2 TABLETS BY MOUTH ONCE DAILY 60 tablet 5   fluticasone (FLONASE) 50 MCG/ACT nasal spray Place 2 sprays into both nostrils daily. 16 g 6   glucose blood (ONETOUCH VERIO) test strip USE 1 STRIP TO CHECK GLUCOSE TWICE DAILY AS NEEDED 200 each 0   Lancets (ONETOUCH DELICA PLUS LANCET33G) MISC USE 1 LANCET TO CHECK GLUCOSE IN THE MORNING AND AFTERNOON 200 each 1   loratadine (CLARITIN) 10 MG tablet Take 1 tablet (10 mg total) by mouth daily. 30 tablet 11   memantine (NAMENDA) 5 MG tablet Take 1 tablet (5 mg total) by mouth 2 (two) times daily. 60 tablet 5   metFORMIN (GLUCOPHAGE) 500 MG tablet TAKE 1 TABLET BY MOUTH TWICE DAILY WITH MEALS 180 tablet 3   Omega-3 Fatty Acids (FISH OIL) 1200 MG CAPS Take 1 capsule by mouth in the morning and at bedtime.     potassium chloride (KLOR-CON) 8 MEQ tablet Take 1 tablet by mouth twice daily 180 tablet 0   Turmeric Curcumin 500 MG CAPS Take 1 capsule in the morning and 2 at bedtime     ALPRAZolam (XANAX) 0.5 MG tablet TAKE 1 TO 2 TABLETS BY MOUTH TWICE DAILY AS NEEDED FOR ANXIETY AND FOR INSOMNIA 60 tablet 2   FLUoxetine (PROZAC)  20 MG capsule Take 20 mg by mouth daily.     oxyCODONE-acetaminophen (PERCOCET) 10-325 MG tablet Take 1 tablet by mouth every 6 (six) hours as needed for pain. 10 tablet 0   Semaglutide (RYBELSUS) 3 MG TABS Take 1 tablet (3 mg total) by mouth daily. 30 tablet 3   Semaglutide (RYBELSUS) 7 MG TABS Take 1 tablet (7 mg total) by mouth daily. Take 30 min ac 30 tablet 1   magnesium oxide (MAG-OX) 400 MG tablet Take 400 mg by mouth daily. (Patient not taking: Reported on 11/05/2022)     Probiotic Product (ALIGN) 4 MG CAPS Take 1 capsule (4 mg total) by mouth daily. (Patient not taking: Reported on 11/05/2022) 30 capsule 0   No facility-administered medications prior to visit.    ROS: Review of Systems  Constitutional:  Negative for activity change, appetite change, chills, fatigue and unexpected weight change.  HENT:  Negative for congestion, mouth sores and sinus pressure.   Eyes:  Negative for visual disturbance.  Respiratory:  Negative for cough and chest tightness.   Gastrointestinal:  Negative for abdominal pain and nausea.  Genitourinary:  Negative for difficulty urinating, frequency and vaginal pain.  Musculoskeletal:  Positive for arthralgias and back pain. Negative

## 2022-11-05 NOTE — Patient Instructions (Signed)
Tiffany Gibbs , Thank you for taking time to come for your Medicare Wellness Visit. I appreciate your ongoing commitment to your health goals. Please review the following plan we discussed and let me know if I can assist you in the future.   Referrals/Orders/Follow-Ups/Clinician Recommendations: No  This is a list of the screening recommended for you and due dates:  Health Maintenance  Topic Date Due   Eye exam for diabetics  05/29/2018   Complete foot exam   05/12/2019   Colon Cancer Screening  09/08/2020   DTaP/Tdap/Td vaccine (2 - Tdap) 02/24/2021   Medicare Annual Wellness Visit  04/02/2022   Flu Shot  08/13/2022   COVID-19 Vaccine (5 - 2023-24 season) 09/13/2022   Hemoglobin A1C  02/06/2023   Yearly kidney health urinalysis for diabetes  05/21/2023   Yearly kidney function blood test for diabetes  08/06/2023   Pneumonia Vaccine  Completed   DEXA scan (bone density measurement)  Completed   Hepatitis C Screening  Completed   Zoster (Shingles) Vaccine  Completed   HPV Vaccine  Aged Out    Advanced directives: (Copy Requested) Please bring a copy of your health care power of attorney and living will to the office to be added to your chart at your convenience.  Next Medicare Annual Wellness Visit scheduled for next year: No

## 2022-11-05 NOTE — Patient Instructions (Signed)
Start chiropractic treatments

## 2022-11-05 NOTE — Assessment & Plan Note (Signed)
Piriformis syndrome R See above Percocet 10/325 prn  Potential benefits of a short/long term opioids use as well as potential risks (i.e. addiction risk, apnea etc) and complications (i.e. Somnolence, constipation and others) were explained to the patient and were aknowledged. Robaxin prn

## 2022-11-12 ENCOUNTER — Ambulatory Visit: Admitting: Neurology

## 2022-11-12 ENCOUNTER — Telehealth: Payer: Self-pay | Admitting: Internal Medicine

## 2022-11-12 NOTE — Telephone Encounter (Signed)
Patient  called and said that the robaxin is affecting her memory - she wants to stop that.  Patient wants to know if another muscle relaxer can be called into replace this.

## 2022-11-16 NOTE — Telephone Encounter (Signed)
All muscle relaxers have similar side effects.  Thank you

## 2022-11-19 NOTE — Telephone Encounter (Signed)
LDVM for pt of Dr. Loren Racer response... " All muscle relaxers have similar side effects.  Thank you

## 2022-11-26 DIAGNOSIS — E1122 Type 2 diabetes mellitus with diabetic chronic kidney disease: Secondary | ICD-10-CM | POA: Diagnosis not present

## 2022-11-26 DIAGNOSIS — I129 Hypertensive chronic kidney disease with stage 1 through stage 4 chronic kidney disease, or unspecified chronic kidney disease: Secondary | ICD-10-CM | POA: Diagnosis not present

## 2022-11-26 DIAGNOSIS — G4733 Obstructive sleep apnea (adult) (pediatric): Secondary | ICD-10-CM | POA: Diagnosis not present

## 2022-11-26 DIAGNOSIS — N182 Chronic kidney disease, stage 2 (mild): Secondary | ICD-10-CM | POA: Diagnosis not present

## 2022-11-26 DIAGNOSIS — N281 Cyst of kidney, acquired: Secondary | ICD-10-CM | POA: Diagnosis not present

## 2022-11-26 DIAGNOSIS — R3129 Other microscopic hematuria: Secondary | ICD-10-CM | POA: Diagnosis not present

## 2022-12-01 ENCOUNTER — Telehealth: Payer: Self-pay | Admitting: Internal Medicine

## 2022-12-01 NOTE — Telephone Encounter (Signed)
Patient states back is still hurting - does not like to take the methocarbonal because it is making her dizzy and does not help with the pain.  Please advise.  Patient wants to know if you could send in oxycodone

## 2022-12-04 MED ORDER — OXYCODONE-ACETAMINOPHEN 10-325 MG PO TABS: 1.0000 | ORAL_TABLET | Freq: Four times a day (QID) | ORAL | 0 refills | Status: DC | PRN

## 2022-12-04 NOTE — Telephone Encounter (Signed)
Noted. Prescription sent. Thanks.

## 2022-12-05 ENCOUNTER — Other Ambulatory Visit: Payer: Self-pay | Admitting: Internal Medicine

## 2022-12-06 ENCOUNTER — Other Ambulatory Visit: Payer: Self-pay | Admitting: Internal Medicine

## 2022-12-11 ENCOUNTER — Other Ambulatory Visit: Payer: Self-pay | Admitting: Internal Medicine

## 2022-12-14 DIAGNOSIS — Z1231 Encounter for screening mammogram for malignant neoplasm of breast: Secondary | ICD-10-CM | POA: Diagnosis not present

## 2022-12-14 LAB — HM MAMMOGRAPHY

## 2022-12-15 ENCOUNTER — Telehealth: Payer: Self-pay | Admitting: Internal Medicine

## 2022-12-15 ENCOUNTER — Encounter: Payer: Self-pay | Admitting: Internal Medicine

## 2022-12-15 NOTE — Telephone Encounter (Signed)
Prescription Request  12/15/2022  LOV: 11/05/2022  What is the name of the medication or equipment? One touch Verio strips  Have you contacted your pharmacy to request a refill? Yes   Which pharmacy would you like this sent to?  [facility] [address], [place] - [address] [place] [address] Phone: [phone] Fax: [phone]    Patient notified that their request is being sent to the clinical staff for review and that they should receive a response within 2 business days.   Please advise at Mobile [phone] (mobile)

## 2022-12-16 ENCOUNTER — Other Ambulatory Visit: Payer: Self-pay

## 2022-12-16 MED ORDER — ONETOUCH VERIO VI STRP: ORAL_STRIP | 3 refills | Status: DC

## 2022-12-17 ENCOUNTER — Encounter: Payer: Self-pay | Admitting: Internal Medicine

## 2022-12-17 ENCOUNTER — Ambulatory Visit: Payer: Medicare HMO | Admitting: Internal Medicine

## 2022-12-17 VITALS — BP 110/62 | HR 69 | Temp 98.3°F | Ht 62.0 in | Wt 167.0 lb

## 2022-12-17 DIAGNOSIS — M25512 Pain in left shoulder: Secondary | ICD-10-CM

## 2022-12-17 DIAGNOSIS — G8929 Other chronic pain: Secondary | ICD-10-CM | POA: Diagnosis not present

## 2022-12-17 DIAGNOSIS — M5440 Lumbago with sciatica, unspecified side: Secondary | ICD-10-CM

## 2022-12-17 DIAGNOSIS — R413 Other amnesia: Secondary | ICD-10-CM

## 2022-12-17 DIAGNOSIS — E538 Deficiency of other specified B group vitamins: Secondary | ICD-10-CM

## 2022-12-17 DIAGNOSIS — E119 Type 2 diabetes mellitus without complications: Secondary | ICD-10-CM | POA: Diagnosis not present

## 2022-12-17 DIAGNOSIS — F419 Anxiety disorder, unspecified: Secondary | ICD-10-CM | POA: Diagnosis not present

## 2022-12-17 MED ORDER — METHYLPREDNISOLONE ACETATE 40 MG/ML IJ SUSP
40.0000 mg | Freq: Once | INTRAMUSCULAR | Status: AC
  Administered 2022-12-17: 40 mg via INTRAMUSCULAR

## 2022-12-17 MED ORDER — CYANOCOBALAMIN 1000 MCG/ML IJ SOLN
1000.0000 ug | Freq: Once | INTRAMUSCULAR | Status: AC
  Administered 2022-12-17: 1000 ug via INTRAMUSCULAR

## 2022-12-17 NOTE — Assessment & Plan Note (Signed)
Rotator cuff tear/bursitis - worse Will inject today Percocet 10/325 prn  Potential benefits of a short/long term opioids use as well as potential risks (i.e. addiction risk, apnea etc) and complications (i.e. Somnolence, constipation and others) were explained to the patient and were aknowledged.

## 2022-12-17 NOTE — Assessment & Plan Note (Signed)
S/p epidural inj Percocet 10/325 prn  Potential benefits of a short/long term opioids use as well as potential risks (i.e. addiction risk, apnea etc) and complications (i.e. Somnolence, constipation and others) were explained to the patient and were aknowledged.

## 2022-12-17 NOTE — Assessment & Plan Note (Signed)
Stress discussed 

## 2022-12-17 NOTE — Addendum Note (Signed)
Addended by: Delsa Grana R on: 12/17/2022 04:18 PM   Modules accepted: Orders

## 2022-12-17 NOTE — Assessment & Plan Note (Signed)
Worse Off Fluoxetine  Alprazolam - rare use  Potential benefits of a long term benzodiazepines  use as well as potential risks  and complications were explained to the patient and were aknowledged.

## 2022-12-17 NOTE — Assessment & Plan Note (Signed)
Monitor A1c 

## 2022-12-17 NOTE — Progress Notes (Signed)
Subjective:  Patient ID: Tiffany Gibbs, female    DOB: 05-Oct-1945  Age: 77 y.o. MRN: [id number]  CC: Medical Management of Chronic Issues (6 WEEK F/U)   HPI Tiffany Gibbs presents for LBP, L shoulder pain C/o worsening anxiety, stress  Outpatient Medications Prior to Visit  Medication Sig Dispense Refill   ALPRAZolam (XANAX) 1 MG tablet Take 1 tablet (1 mg total) by mouth 2 (two) times daily as needed for anxiety. 60 tablet 2   Blood Glucose Monitoring Suppl (ONETOUCH VERIO) w/Device KIT Use to check blood sugar daily. DX E11.9 1 kit 0   Cholecalciferol (VITAMIN D3) 50 MCG (2000 UT) CAPS Take 1 capsule (2,000 Units total) by mouth daily. 100 capsule 3   Cyanocobalamin (B-12) 5000 MCG CAPS Take 1 tablet by mouth once a week.      diphenhydrAMINE (BENADRYL) 25 mg capsule Take 25 mg by mouth daily as needed (allergies).      FLUoxetine (PROZAC) 20 MG tablet Take 1 tablet (20 mg total) by mouth daily. 90 tablet 1   fluticasone (FLONASE) 50 MCG/ACT nasal spray Place 2 sprays into both nostrils daily. 16 g 6   glucose blood (ONETOUCH VERIO) test strip USE 1 STRIP TO CHECK GLUCOSE TWICE DAILY AS NEEDED 200 each 3   Lancets (ONETOUCH DELICA PLUS LANCET33G) MISC USE 1 LANCET TO CHECK GLUCOSE IN THE MORNING AND AFTERNOON 200 each 1   loratadine (CLARITIN) 10 MG tablet Take 1 tablet (10 mg total) by mouth daily. 30 tablet 11   memantine (NAMENDA) 5 MG tablet Take 1 tablet by mouth twice daily 180 tablet 3   metFORMIN (GLUCOPHAGE) 500 MG tablet TAKE 1 TABLET BY MOUTH TWICE DAILY WITH MEALS 180 tablet 3   Omega-3 Fatty Acids (FISH OIL) 1200 MG CAPS Take 1 capsule by mouth in the morning and at bedtime.     oxyCODONE-acetaminophen (PERCOCET) 10-325 MG tablet Take 1 tablet by mouth every 6 (six) hours as needed for pain. 30 tablet 0   potassium chloride (KLOR-CON) 8 MEQ tablet Take 1 tablet by mouth twice daily 180 tablet 1   Semaglutide (RYBELSUS) 7 MG TABS Take 1 tablet (7 mg total)  by mouth daily. Take 30 min ac 30 tablet 3   Turmeric Curcumin 500 MG CAPS Take 1 capsule in the morning and 2 at bedtime     magnesium oxide (MAG-OX) 400 MG tablet Take 400 mg by mouth daily. (Patient not taking: Reported on 11/05/2022)     Probiotic Product (ALIGN) 4 MG CAPS Take 1 capsule (4 mg total) by mouth daily. (Patient not taking: Reported on 11/05/2022) 30 capsule 0   No facility-administered medications prior to visit.    ROS: Review of Systems  Constitutional:  Negative for activity change, appetite change, chills, fatigue and unexpected weight change.  HENT:  Negative for congestion, mouth sores and sinus pressure.   Eyes:  Negative for visual disturbance.  Respiratory:  Negative for cough and chest tightness.   Gastrointestinal:  Negative for abdominal pain and nausea.  Genitourinary:  Negative for difficulty urinating, frequency and vaginal pain.  Musculoskeletal:  Negative for back pain and gait problem.  Skin:  Negative for pallor and rash.  Neurological:  Negative for dizziness, tremors, weakness, numbness and headaches.  Psychiatric/Behavioral:  Negative for confusion and sleep disturbance.     Objective:  BP 110/62 (BP Location: Left Arm, Patient Position: Sitting, Cuff Size: Normal)   Pulse 69   Temp 98.3 F (36.8 C) (Oral)  Ht 5\' 2"  (1.575 m)   Wt 167 lb (75.8 kg)   SpO2 98%   BMI 30.54 kg/m   BP Readings from Last 3 Encounters:  12/17/22 110/62  11/05/22 130/80  11/05/22 130/80    Wt Readings from Last 3 Encounters:  12/17/22 167 lb (75.8 kg)  11/05/22 162 lb (73.5 kg)  11/05/22 162 lb (73.5 kg)    Physical Exam Constitutional:      General: She is not in acute distress.    Appearance: She is well-developed.  HENT:     Head: Normocephalic.     Right Ear: External ear normal.     Left Ear: External ear normal.     Nose: Nose normal.  Eyes:     General:        Right eye: No discharge.        Left eye: No discharge.      Conjunctiva/sclera: Conjunctivae normal.     Pupils: Pupils are equal, round, and reactive to light.  Neck:     Thyroid: No thyromegaly.     Vascular: No JVD.     Trachea: No tracheal deviation.  Cardiovascular:     Rate and Rhythm: Normal rate and regular rhythm.     Heart sounds: Normal heart sounds.  Pulmonary:     Effort: No respiratory distress.     Breath sounds: No stridor. No wheezing.  Abdominal:     General: Bowel sounds are normal. There is no distension.     Palpations: Abdomen is soft. There is no mass.     Tenderness: There is no abdominal tenderness. There is no guarding or rebound.  Musculoskeletal:        General: No tenderness.     Cervical back: Normal range of motion and neck supple. No rigidity.  Lymphadenopathy:     Cervical: No cervical adenopathy.  Skin:    Findings: No erythema or rash.  Neurological:     Cranial Nerves: No cranial nerve deficit.     Motor: No abnormal muscle tone.     Coordination: Coordination normal.     Deep Tendon Reflexes: Reflexes normal.  Psychiatric:        Behavior: Behavior normal.        Thought Content: Thought content normal.        Judgment: Judgment normal.    L shoulder w/pain and decr ROM  Procedure :Joint Injection, L  shoulder   Indication:  Subacromial bursitis with refractory  chronic pain.   Risks including unsuccessful procedure , bleeding, infection, bruising, skin atrophy, "steroid flare-up" and others were explained to the patient in detail as well as the benefits. Informed consent was obtained and signed.   Tthe patient was placed in a comfortable position. Lateral approach was used. Skin was prepped with Betadine and alcohol  and anesthetized with a cooling spray. Then, a 5 cc syringe with a 2 inch long 24-gauge needle was used for a joint injection.. The needle was advanced  Into the subacromial space.The bursa was injected with 3 mL of 2% lidocaine and 40 mg of Depo-Medrol .  Band-Aid was applied.    Tolerated well. Complications: None. Good pain relief following the procedure.   Postprocedure instructions :    A Band-Aid should be left on for 12 hours. Injection therapy is not a cure itself. It is used in conjunction with other modalities. You can use nonsteroidal anti-inflammatories like ibuprofen , hot and cold compresses. Rest is recommended in the next 24  hours. You need to report immediately  if fever, chills or any signs of infection develop.   Lab Results  Component Value Date   WBC 6.4 03/10/2022   HGB 14.0 03/10/2022   HCT 41.6 03/10/2022   PLT 206.0 03/10/2022   GLUCOSE 94 11/05/2022   CHOL 209 (H) 03/10/2022   TRIG 71.0 03/10/2022   HDL 74.10 03/10/2022   LDLDIRECT 148.2 02/03/2013   LDLCALC 120 (H) 03/10/2022   ALT 11 11/05/2022   AST 15 11/05/2022   NA 140 11/05/2022   K 3.9 11/05/2022   CL 105 11/05/2022   CREATININE 1.03 11/05/2022   BUN 20 11/05/2022   CO2 28 11/05/2022   TSH 1.15 11/05/2022   INR 1.0 10/28/2018   HGBA1C 5.4 11/05/2022   MICROALBUR 6.0 (H) 06/26/2021    VAS [place] LOWER EXT ART SEG MULTI (SEGMENTALS & LE RAYNAUDS)  Result Date: 08/15/2021  LOWER EXTREMITY DOPPLER STUDY Patient Name:  Tiffany Gibbs  Date of Exam:   08/15/2021 Medical Rec #: [id number]               Accession #:    [id number] Date of Birth: February 17, 1945               Patient Gender: F Patient Age:   28 years Exam Location:  [facility] Procedure:      VAS [place] LOWER EXT ART SEG MULTI (SEGMENTALS & LE RAYNAUDS) Referring Phys: Macarthur Critchley Jahniyah Revere --------------------------------------------------------------------------------  Indications: Patient presents with complaints of numbness of the bilateral feet              and up into the right calf, feelings of coldness in the feet and              leg cramping at night. High Risk Factors: Hypertension, Diabetes, past history of smoking.  Performing Technologist: Olegario Shearer RVT  Examination Guidelines: A complete evaluation includes at  minimum, Doppler waveform signals and systolic blood pressure reading at the level of bilateral brachial, anterior tibial, and posterior tibial arteries, when vessel segments are accessible. Bilateral testing is considered an integral part of a complete examination. Photoelectric Plethysmograph (PPG) waveforms and toe systolic pressure readings are included as required and additional duplex testing as needed. Limited examinations for reoccurring indications may be performed as noted.  ABI Findings: +---------+------------------+-----+---------+--------+ Right    Rt Pressure (mmHg)IndexWaveform Comment  +---------+------------------+-----+---------+--------+ Brachial 154                                      +---------+------------------+-----+---------+--------+ CFA                             triphasic         +---------+------------------+-----+---------+--------+ Popliteal                       triphasic         +---------+------------------+-----+---------+--------+ PTA      180               1.17 triphasic         +---------+------------------+-----+---------+--------+ PERO                            biphasic          +---------+------------------+-----+---------+--------+ DP       174  1.13 triphasic         +---------+------------------+-----+---------+--------+ Great Toe116               0.75 Normal            +---------+------------------+-----+---------+--------+ +---------+------------------+-----+---------+-------+ Left     Lt Pressure (mmHg)IndexWaveform Comment +---------+------------------+-----+---------+-------+ Brachial 152                                     +---------+------------------+-----+---------+-------+ CFA                             triphasic        +---------+------------------+-----+---------+-------+ Popliteal                       triphasic         +---------+------------------+-----+---------+-------+ PTA      180               1.17 triphasic        +---------+------------------+-----+---------+-------+ PERO     157               1.02 triphasic        +---------+------------------+-----+---------+-------+ DP       178               1.16 biphasic         +---------+------------------+-----+---------+-------+ Great Toe132               0.86 Normal           +---------+------------------+-----+---------+-------+ +-------+-----------+-----------+------------+------------+ ABI/TBIToday's ABIToday's TBIPrevious ABIPrevious TBI +-------+-----------+-----------+------------+------------+ Right  1.17       0.75                                +-------+-----------+-----------+------------+------------+ Left   1.1        0.86                                +-------+-----------+-----------+------------+------------+  Summary: Right: Resting right ankle-brachial index is within normal range. No evidence of significant right lower extremity arterial disease. The right toe-brachial index is normal. Left: Resting left ankle-brachial index is within normal range. No evidence of significant left lower extremity arterial disease. The left toe-brachial index is normal. *See table(s) above for measurements and observations.  Electronically signed by Lance Muss MD on 08/15/2021 at 10:00:05 PM.    Final     Assessment & Plan:   Problem List Items Addressed This Visit     Anxiety disorder    Worse Off Fluoxetine  Alprazolam - rare use  Potential benefits of a long term benzodiazepines  use as well as potential risks  and complications were explained to the patient and were aknowledged.       LOW BACK PAIN    S/p epidural inj Percocet 10/325 prn  Potential benefits of a short/long term opioids use as well as potential risks (i.e. addiction risk, apnea etc) and complications (i.e. Somnolence, constipation and others) were  explained to the patient and were aknowledged.      Memory difficulty    Stress discussed      B12 deficiency    Cont w/Vit B12 B12 inj today      DM2 (diabetes mellitus, type 2) (  HCC)    Monitor A1c      Shoulder pain, left - Primary    Rotator cuff tear/bursitis - worse Will inject today Percocet 10/325 prn  Potential benefits of a short/long term opioids use as well as potential risks (i.e. addiction risk, apnea etc) and complications (i.e. Somnolence, constipation and others) were explained to the patient and were aknowledged.         No orders of the defined types were placed in this encounter.     Follow-up: Return in about 3 months (around 03/17/2023) for a follow-up visit.  Sonda Primes, MD

## 2022-12-17 NOTE — Assessment & Plan Note (Addendum)
Cont w/Vit B12 B12 inj today

## 2023-02-02 ENCOUNTER — Telehealth: Payer: Self-pay

## 2023-02-02 NOTE — Telephone Encounter (Signed)
Pt states her husband has been Dx with Parkinson's disease... When pt husband filled out paper work and mistakenly put that pt has Dementia. Pt is needing a letter to clarify that pt does not have Dementia. This letter needs to be sent ASAP in order for benefits from [place] to help cover [facility] and other benefits her husband has filed for.

## 2023-02-02 NOTE — Telephone Encounter (Signed)
Copied from CRM [id number]. Topic: General - Call Back - No Documentation >> Feb 02, 2023 12:16 PM Theodis Sato wrote: Reason for CRM: Patient is requesting to speak with Dr. Adah Perl nurse  and states it is an urgent matter but would like only to discuss this with nurse.

## 2023-02-05 DIAGNOSIS — E119 Type 2 diabetes mellitus without complications: Secondary | ICD-10-CM | POA: Diagnosis not present

## 2023-02-05 DIAGNOSIS — Z041 Encounter for examination and observation following transport accident: Secondary | ICD-10-CM | POA: Diagnosis not present

## 2023-02-05 DIAGNOSIS — E11649 Type 2 diabetes mellitus with hypoglycemia without coma: Secondary | ICD-10-CM | POA: Diagnosis not present

## 2023-02-08 ENCOUNTER — Ambulatory Visit: Payer: Self-pay | Admitting: Internal Medicine
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
## 2023-02-11 NOTE — Telephone Encounter (Signed)
Awaiting provider response to see if pt can be seen ASAP.

## 2023-02-15 ENCOUNTER — Ambulatory Visit: Payer: Medicare HMO | Admitting: Internal Medicine

## 2023-02-15 ENCOUNTER — Encounter: Payer: Self-pay | Admitting: Internal Medicine

## 2023-02-15 ENCOUNTER — Other Ambulatory Visit: Payer: Medicare HMO

## 2023-02-15 DIAGNOSIS — Z0000 Encounter for general adult medical examination without abnormal findings: Secondary | ICD-10-CM

## 2023-02-15 DIAGNOSIS — E119 Type 2 diabetes mellitus without complications: Secondary | ICD-10-CM

## 2023-02-15 DIAGNOSIS — F419 Anxiety disorder, unspecified: Secondary | ICD-10-CM | POA: Diagnosis not present

## 2023-02-15 DIAGNOSIS — I8002 Phlebitis and thrombophlebitis of superficial vessels of left lower extremity: Secondary | ICD-10-CM | POA: Diagnosis not present

## 2023-02-15 DIAGNOSIS — E11649 Type 2 diabetes mellitus with hypoglycemia without coma: Secondary | ICD-10-CM | POA: Diagnosis not present

## 2023-02-15 DIAGNOSIS — R410 Disorientation, unspecified: Secondary | ICD-10-CM | POA: Diagnosis not present

## 2023-02-15 DIAGNOSIS — F411 Generalized anxiety disorder: Secondary | ICD-10-CM | POA: Diagnosis not present

## 2023-02-15 DIAGNOSIS — E538 Deficiency of other specified B group vitamins: Secondary | ICD-10-CM

## 2023-02-15 DIAGNOSIS — I10 Essential (primary) hypertension: Secondary | ICD-10-CM

## 2023-02-15 DIAGNOSIS — R413 Other amnesia: Secondary | ICD-10-CM

## 2023-02-15 DIAGNOSIS — J4520 Mild intermittent asthma, uncomplicated: Secondary | ICD-10-CM | POA: Diagnosis not present

## 2023-02-15 DIAGNOSIS — Z7984 Long term (current) use of oral hypoglycemic drugs: Secondary | ICD-10-CM

## 2023-02-15 LAB — CBC WITH DIFFERENTIAL/PLATELET
Basophils Absolute: 0.1 10*3/uL (ref 0.0–0.1)
Basophils Relative: 0.9 % (ref 0.0–3.0)
Eosinophils Absolute: 0.1 10*3/uL (ref 0.0–0.7)
Eosinophils Relative: 1.1 % (ref 0.0–5.0)
HCT: 42.4 % (ref 36.0–46.0)
Hemoglobin: 14.1 g/dL (ref 12.0–15.0)
Lymphocytes Relative: 26.1 % (ref 12.0–46.0)
Lymphs Abs: 1.7 10*3/uL (ref 0.7–4.0)
MCHC: 33.2 g/dL (ref 30.0–36.0)
MCV: 91.4 fL (ref 78.0–100.0)
Monocytes Absolute: 0.6 10*3/uL (ref 0.1–1.0)
Monocytes Relative: 9.9 % (ref 3.0–12.0)
Neutro Abs: 3.9 10*3/uL (ref 1.4–7.7)
Neutrophils Relative %: 62 % (ref 43.0–77.0)
Platelets: 246 10*3/uL (ref 150.0–400.0)
RBC: 4.64 Mil/uL (ref 3.87–5.11)
RDW: 13.6 % (ref 11.5–15.5)
WBC: 6.4 10*3/uL (ref 4.0–10.5)

## 2023-02-15 LAB — SEDIMENTATION RATE: Sed Rate: 17 mm/h (ref 0–30)

## 2023-02-15 MED ORDER — METFORMIN HCL 500 MG PO TABS: 500.0000 mg | ORAL_TABLET | Freq: Every day | ORAL | Status: AC

## 2023-02-15 NOTE — Assessment & Plan Note (Signed)
 No change
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
## 2023-02-15 NOTE — Assessment & Plan Note (Signed)
 Worse Off Fluoxetine  Alprazolam - rare use  Potential benefits of a long term benzodiazepines  use as well as potential risks  and complications were explained to the patient and were aknowledged.
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
## 2023-02-15 NOTE — Assessment & Plan Note (Addendum)
Likely due to hypoglycemia Pt did not take Xanax or Oxy in a while  Pt was confused for <1 hr or so  No driving for now

## 2023-02-16 ENCOUNTER — Telehealth: Payer: Self-pay | Admitting: Internal Medicine

## 2023-02-16 LAB — COMPREHENSIVE METABOLIC PANEL
ALT: 11 U/L (ref 0–35)
AST: 17 U/L (ref 0–37)
Albumin: 4.4 g/dL (ref 3.5–5.2)
Alkaline Phosphatase: 49 U/L (ref 39–117)
BUN: 17 mg/dL (ref 6–23)
CO2: 27 meq/L (ref 19–32)
Calcium: 9.6 mg/dL (ref 8.4–10.5)
Chloride: 105 meq/L (ref 96–112)
Creatinine, Ser: 1.14 mg/dL (ref 0.40–1.20)
GFR: 46.39 mL/min — ABNORMAL LOW (ref >60.00)
Glucose, Bld: 73 mg/dL (ref 70–99)
Potassium: 3.9 meq/L (ref 3.5–5.1)
Sodium: 142 meq/L (ref 135–145)
Total Bilirubin: 0.3 mg/dL (ref 0.2–1.2)
Total Protein: 7.2 g/dL (ref 6.0–8.3)

## 2023-02-16 LAB — URINALYSIS, ROUTINE W REFLEX MICROSCOPIC
Bilirubin Urine: NEGATIVE
Ketones, ur: NEGATIVE
Leukocytes,Ua: NEGATIVE
Nitrite: NEGATIVE
Specific Gravity, Urine: 1.025 (ref 1.000–1.030)
Total Protein, Urine: NEGATIVE
Urine Glucose: NEGATIVE
Urobilinogen, UA: 0.2 (ref 0.0–1.0)
pH: 6 (ref 5.0–8.0)

## 2023-02-16 LAB — VITAMIN B12: Vitamin B-12: >1537 pg/mL — ABNORMAL HIGH (ref 211–911)

## 2023-02-16 LAB — HEMOGLOBIN A1C: Hgb A1c MFr Bld: 5.6 % (ref 4.6–6.5)

## 2023-02-16 LAB — TSH: TSH: 1.21 u[IU]/mL (ref 0.35–5.50)
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
## 2023-02-16 NOTE — Telephone Encounter (Signed)
 Copied from CRM [id number]. Topic: Clinical - Request for Lab/Test Order >> Feb 16, 2023 11:15 AM Tiffany H wrote: Reason for CRM: Patient called to advise that Tiffany Gibbs at [facility] does not do CT w/Out Contrast.   Please provide patient with location where patient can have both CT and lab work done.

## 2023-02-17 ENCOUNTER — Telehealth: Payer: Self-pay | Admitting: Internal Medicine

## 2023-02-17 ENCOUNTER — Telehealth: Payer: Self-pay
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
## 2023-02-18 NOTE — Telephone Encounter (Signed)
 Pt is coming in to see PCP at 1:30 on 02/19/2023. All of pts concerns will be addressed at this apptmnt.
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
## 2023-02-19 ENCOUNTER — Ambulatory Visit: Payer: Medicare HMO | Admitting: Internal Medicine

## 2023-02-20 ENCOUNTER — Other Ambulatory Visit: Payer: Self-pay | Admitting: Internal Medicine

## 2023-02-21 NOTE — Telephone Encounter (Signed)
 CT is scheduled on 02/23/2023  1:00 PM at [facility] AT [address] (it is not the [place] office).  Thanks

## 2023-02-22 NOTE — Telephone Encounter (Signed)
 Copied from CRM [id number]. Topic: General - Other >> Feb 22, 2023  9:38 AM Allyne Areola wrote: Reason for CRM: Patient is calling to advised the CT head is scheduled for tomorrow 02/23/2023 at 1:00 pm.

## 2023-02-23 ENCOUNTER — Ambulatory Visit
Admission: RE | Admit: 2023-02-23 | Discharge: 2023-02-23 | Disposition: A | Discharge status: Home / Self Care | Payer: Medicare HMO | Source: Ambulatory Visit | Attending: Internal Medicine | Admitting: Internal Medicine

## 2023-02-23 DIAGNOSIS — R410 Disorientation, unspecified: Secondary | ICD-10-CM

## 2023-02-23 DIAGNOSIS — G4733 Obstructive sleep apnea (adult) (pediatric): Secondary | ICD-10-CM | POA: Diagnosis not present

## 2023-02-23 DIAGNOSIS — E11649 Type 2 diabetes mellitus with hypoglycemia without coma: Secondary | ICD-10-CM | POA: Diagnosis not present

## 2023-02-23 DIAGNOSIS — I10 Essential (primary) hypertension: Secondary | ICD-10-CM | POA: Diagnosis not present

## 2023-02-25 ENCOUNTER — Telehealth: Payer: Self-pay | Admitting: Internal Medicine

## 2023-02-25 NOTE — Telephone Encounter (Signed)
Copied from CRM [id number]. Topic: Clinical - Medical Advice >> Feb 25, 2023 12:12 PM Almira Coaster wrote: Reason for CRM: Patient read an article on Vitamin C and asked if Dr.[name] recommends taking a supplement instead of eating or drinking different food that contains vitamin C. She is a diabetic and doesn't want to take anything unless Dr.[name] thinks it's a good idea.

## 2023-02-27 NOTE — Telephone Encounter (Signed)
Oranges and Lemons are better Thx

## 2023-03-02 NOTE — Telephone Encounter (Signed)
Tried to contact pt and inform her of providers suggestion as follows " Oranges and Lemons are better Thx

## 2023-03-04 ENCOUNTER — Telehealth: Payer: Self-pay

## 2023-03-04 NOTE — Telephone Encounter (Signed)
Copied from CRM [id number]. Topic: Clinical - Lab/Test Results >> Mar 04, 2023 12:58 PM Tiffany Gibbs wrote: Reason for CRM: Patient wants to know If Dr. Posey Rea read her imaging results yet. She wants to know if everything is ok. Patient is requesting a callback.

## 2023-03-05 NOTE — Telephone Encounter (Signed)
Pts imaging report  has not gotten back from Radiology for provider to review as of yet.

## 2023-03-10 ENCOUNTER — Telehealth: Payer: Self-pay | Admitting: Internal Medicine
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
## 2023-03-11 NOTE — Telephone Encounter (Signed)
 Copied from CRM [id number]. Topic: Clinical - Medical Advice >> Mar 11, 2023 10:26 AM Efraim Kaufmann C wrote: Reason for CRM: [employer] had some forms that they wanted her to send and she asked that they fax them to him before her next appointment. She asked that doctor fill out forms but hold off on faxing them back as she also has to sign them and she would like to go over them with him first as well. Therefore, fill out forms but have them available for her at appointment to sign and go over. Thank you

## 2023-03-11 NOTE — Telephone Encounter (Signed)
 Okay.  Thanks.

## 2023-03-18 ENCOUNTER — Ambulatory Visit: Payer: Medicare HMO | Admitting: Internal Medicine

## 2023-03-18 ENCOUNTER — Encounter: Payer: Self-pay | Admitting: Internal Medicine

## 2023-03-18 VITALS — BP 118/78 | HR 79 | Temp 98.4°F | Ht 62.0 in | Wt 170.0 lb

## 2023-03-18 DIAGNOSIS — E11649 Type 2 diabetes mellitus with hypoglycemia without coma: Secondary | ICD-10-CM

## 2023-03-18 DIAGNOSIS — Z7984 Long term (current) use of oral hypoglycemic drugs: Secondary | ICD-10-CM | POA: Diagnosis not present

## 2023-03-18 DIAGNOSIS — R413 Other amnesia: Secondary | ICD-10-CM | POA: Diagnosis not present

## 2023-03-18 DIAGNOSIS — N183 Chronic kidney disease, stage 3 unspecified: Secondary | ICD-10-CM

## 2023-03-18 DIAGNOSIS — Z636 Dependent relative needing care at home: Secondary | ICD-10-CM | POA: Insufficient documentation

## 2023-03-18 DIAGNOSIS — E119 Type 2 diabetes mellitus without complications: Secondary | ICD-10-CM

## 2023-03-18 DIAGNOSIS — E538 Deficiency of other specified B group vitamins: Secondary | ICD-10-CM

## 2023-03-18 NOTE — Assessment & Plan Note (Signed)
 We reduced Metformin to 500 mg every day. Doing well. No low CBGs
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
## 2023-03-18 NOTE — Assessment & Plan Note (Signed)
 Cont w/Vit B12 B12 inj today
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
## 2023-03-18 NOTE — Assessment & Plan Note (Signed)
 F/u Dr Marlan Palau well

## 2023-03-18 NOTE — Assessment & Plan Note (Signed)
 Doing well

## 2023-03-22 ENCOUNTER — Ambulatory Visit: Payer: Self-pay | Admitting: Internal Medicine

## 2023-03-22 NOTE — Telephone Encounter (Signed)
 Copied from CRM [id number]. Topic: Clinical - Lab/Test Results >> Mar 22, 2023  2:05 PM Martinique E wrote: Reason for CRM: Relayed results to patient, but patient is having further questions regarding her lab results. Reason for Disposition  Nursing judgment or information in reference  Answer Assessment - Initial Assessment Questions 1. REASON FOR CALL: "What is your main concern right now?"     Lab results read to patient and her questions answered.  Protocols used: No Guideline Available-A-AH

## 2023-03-31 ENCOUNTER — Other Ambulatory Visit: Payer: Self-pay | Admitting: Internal Medicine

## 2023-04-12 ENCOUNTER — Telehealth: Payer: Self-pay | Admitting: Internal Medicine

## 2023-04-12 NOTE — Telephone Encounter (Signed)
 Copied from CRM [id number]. Topic: General - Other >> Apr 12, 2023 11:53 AM Eunice Blase wrote: Reason for CRM: Pt called for imaging results which were provided. Pt did have question on age-related atrophy noted.Please mail Elesa a copy of this report. Please call pt at [phone].  ---  Please call pt and advise.  Pt's results have been mailed to her

## 2023-04-16 NOTE — Telephone Encounter (Signed)
 Copied from CRM [id number]. Topic: General - Other >> Apr 16, 2023  2:26 PM Truddie Crumble wrote: Reason for CRM: patient returning a call to zachary at the office

## 2023-04-16 NOTE — Telephone Encounter (Signed)
 Returned call to patient and informed her that age related atrophy was normal. Would double check with Dr.[name] to make sure there wasn't anything else of note

## 2023-04-16 NOTE — Telephone Encounter (Signed)
 Attempted to call patient but had to leave a message for them to call back

## 2023-04-27 ENCOUNTER — Other Ambulatory Visit: Payer: Self-pay | Admitting: Internal Medicine

## 2023-05-03 ENCOUNTER — Other Ambulatory Visit: Payer: Self-pay | Admitting: Internal Medicine

## 2023-05-07 ENCOUNTER — Other Ambulatory Visit: Payer: Self-pay | Admitting: Internal Medicine

## 2023-05-07 DIAGNOSIS — E119 Type 2 diabetes mellitus without complications: Secondary | ICD-10-CM

## 2023-05-18 ENCOUNTER — Ambulatory Visit (INDEPENDENT_AMBULATORY_CARE_PROVIDER_SITE_OTHER): Admitting: Internal Medicine

## 2023-05-18 ENCOUNTER — Encounter: Payer: Self-pay | Admitting: Internal Medicine

## 2023-05-18 VITALS — BP 108/70 | HR 59 | Temp 97.9°F | Ht 62.0 in | Wt 173.0 lb

## 2023-05-18 DIAGNOSIS — T466X5A Adverse effect of antihyperlipidemic and antiarteriosclerotic drugs, initial encounter: Secondary | ICD-10-CM | POA: Diagnosis not present

## 2023-05-18 DIAGNOSIS — R410 Disorientation, unspecified: Secondary | ICD-10-CM | POA: Diagnosis not present

## 2023-05-18 DIAGNOSIS — I10 Essential (primary) hypertension: Secondary | ICD-10-CM | POA: Diagnosis not present

## 2023-05-18 DIAGNOSIS — Z7984 Long term (current) use of oral hypoglycemic drugs: Secondary | ICD-10-CM | POA: Diagnosis not present

## 2023-05-18 DIAGNOSIS — F4321 Adjustment disorder with depressed mood: Secondary | ICD-10-CM | POA: Diagnosis not present

## 2023-05-18 DIAGNOSIS — G720 Drug-induced myopathy: Secondary | ICD-10-CM

## 2023-05-18 DIAGNOSIS — M5440 Lumbago with sciatica, unspecified side: Secondary | ICD-10-CM

## 2023-05-18 DIAGNOSIS — E119 Type 2 diabetes mellitus without complications: Secondary | ICD-10-CM | POA: Diagnosis not present

## 2023-05-18 DIAGNOSIS — F419 Anxiety disorder, unspecified: Secondary | ICD-10-CM | POA: Diagnosis not present

## 2023-05-18 LAB — COMPREHENSIVE METABOLIC PANEL WITH GFR
ALT: 11 U/L (ref 0–35)
AST: 17 U/L (ref 0–37)
Albumin: 4.7 g/dL (ref 3.5–5.2)
Alkaline Phosphatase: 52 U/L (ref 39–117)
BUN: 19 mg/dL (ref 6–23)
CO2: 25 meq/L (ref 19–32)
Calcium: 9.8 mg/dL (ref 8.4–10.5)
Chloride: 104 meq/L (ref 96–112)
Creatinine, Ser: 1.14 mg/dL (ref 0.40–1.20)
GFR: 46.31 mL/min — ABNORMAL LOW (ref >60.00)
Glucose, Bld: 86 mg/dL (ref 70–99)
Potassium: 4 meq/L (ref 3.5–5.1)
Sodium: 139 meq/L (ref 135–145)
Total Bilirubin: 0.7 mg/dL (ref 0.2–1.2)
Total Protein: 7.5 g/dL (ref 6.0–8.3)

## 2023-05-18 LAB — HEMOGLOBIN A1C: Hgb A1c MFr Bld: 5.6 % (ref 4.6–6.5)

## 2023-05-18 NOTE — Assessment & Plan Note (Addendum)
 On metformin  500 mg a day CBG 100-120

## 2023-05-18 NOTE — Assessment & Plan Note (Signed)
Furosemide prn 

## 2023-05-18 NOTE — Assessment & Plan Note (Signed)
Off Rx 
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
## 2023-05-18 NOTE — Assessment & Plan Note (Signed)
 On Alprazolam  - rare use  Potential benefits of a long term benzodiazepines  use as well as potential risks  and complications were explained to the patient and were aknowledged.

## 2023-05-18 NOTE — Assessment & Plan Note (Signed)
 Chronic Percocet 10/325 prn  Potential benefits of a short/long term opioids use as well as potential risks (i.e. addiction risk, apnea etc) and complications (i.e. Somnolence, constipation and others) were explained to the patient and were aknowledged.

## 2023-05-18 NOTE — Assessment & Plan Note (Signed)
 Doing better.

## 2023-05-18 NOTE — Assessment & Plan Note (Signed)
 Resolved  Likely due to hypoglycemia - norelapse

## 2023-05-19 ENCOUNTER — Telehealth: Payer: Self-pay | Admitting: Internal Medicine

## 2023-05-19 NOTE — Telephone Encounter (Unsigned)
 Copied from CRM [id number]. Topic: Clinical - Lab/Test Results >> May 19, 2023 10:38 AM Tiffany Gibbs wrote: Reason for CRM: patient is calling for A1C results and results from labs

## 2023-05-20 NOTE — Telephone Encounter (Signed)
 Spoke with the pt and was able to inform her of her normal labs. Pts states understanding and has no questions or concerns at this time.

## 2023-05-24 ENCOUNTER — Other Ambulatory Visit: Payer: Self-pay | Admitting: Internal Medicine

## 2023-06-17 ENCOUNTER — Ambulatory Visit: Payer: Self-pay

## 2023-06-17 ENCOUNTER — Other Ambulatory Visit: Payer: Self-pay | Admitting: Internal Medicine
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
## 2023-06-22 ENCOUNTER — Other Ambulatory Visit: Payer: Self-pay | Admitting: Internal Medicine

## 2023-06-22 MED ORDER — DONEPEZIL HCL 5 MG PO TABS: 5.0000 mg | ORAL_TABLET | Freq: Every day | ORAL | 5 refills | Status: DC

## 2023-06-22 NOTE — Telephone Encounter (Signed)
 Tried to reach the pt and was unable to inform the pt of the providers advice as the pt did not answer... Please inform the pt of the following per her provider "Noted.  Continue memantine .  DONEPEZIL  5 mg at night.  Schedule follow-up appointment with me.  Thanks"

## 2023-06-22 NOTE — Telephone Encounter (Signed)
 Noted.  Continue memantine .  DONEPEZIL  5 mg at night.  Schedule follow-up appointment with me.  Thanks

## 2023-06-25 ENCOUNTER — Telehealth: Payer: Self-pay | Admitting: Internal Medicine
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
## 2023-07-05 ENCOUNTER — Ambulatory Visit: Payer: Self-pay

## 2023-07-05 NOTE — Telephone Encounter (Signed)
 FYI Only or Action Required?: FYI only for provider.  Patient was last seen in primary care on 05/18/2023 by Tiffany Gibbs, [name], MD. Called Nurse Triage reporting Depression. Symptoms began year. Interventions attempted: Prescription medications: anti anxiety and anti depressant. Symptoms are: gradually worsening.  Triage Disposition: See Physician Within 24 Hours  Patient/caregiver understands and will follow disposition?: Yes   CAlled CAL line and discussed assessment - Pt has limitations on when she can come in due to being present with ill husband. Rexene opened up an appt for 07/08/23 at 1600. Pt stated she will be able to come.             Copied from CRM [id number]. Topic: Clinical - Red Word Triage >> Jul 05, 2023 11:50 AM [name] wrote: Red Word that prompted transfer to Nurse Triage: Become so Depressed unsure if medication is still working Reason for Disposition  Sometimes has thoughts of suicide  Answer Assessment - Initial Assessment Questions 1. CONCERN: What happened that made you call today?     Meds not helping been a year on med  2. DEPRESSION SYMPTOM SCREENING: How are you feeling overall? (e.g., decreased energy, increased sleeping or difficulty sleeping, difficulty concentrating, feelings of sadness, guilt, hopelessness, or worthlessness)     Worried about spouse failing health, guilty, frustrated husband requiring assistance and feels angry, scared because of husbands health, poor sleep 3. RISK OF HARM - SUICIDAL IDEATION:  Do you ever have thoughts of hurting or killing yourself?  (e.g., yes, no, no but preoccupation with thoughts about death)   - INTENT:  Do you have thoughts of hurting or killing yourself right NOW? (e.g., yes, no, N/A)   - PLAN: Do you have a specific plan for how you would do this? (e.g., gun, knife, overdose, no plan, N/A)     Suicidal ideation- Why can't I just die and get it over with 4. RISK OF HARM - HOMICIDAL IDEATION:   Do you ever have thoughts of hurting or killing someone else?  (e.g., yes, no, no but preoccupation with thoughts about death)   - INTENT:  Do you have thoughts of hurting or killing someone right NOW? (e.g., yes, no, N/A)   - PLAN: Do you have a specific plan for how you would do this? (e.g., gun, knife, no plan, N/A)      *No Answer* 5. FUNCTIONAL IMPAIRMENT: How have things been going for you overall? Have you had more difficulty than usual doing your normal daily activities?  (e.g., better, same, worse; self-care, school, work, interactions)     *No Answer* 6. SUPPORT: Who is with you now? Who do you live with? Do you have family or friends who you can talk to?      *No Answer* 7. THERAPIST: Do you have a counselor or therapist? Name?     no 8. STRESSORS: Has there been any new stress or recent changes in your life?     Failing health of her husband  88. ALCOHOL USE OR SUBSTANCE USE (DRUG USE): Do you drink alcohol or use any illegal drugs?     *No Answer* 10. OTHER: Do you have any other physical symptoms right now? (e.g., fever)       *No Answer*  Answer Assessment - Initial Assessment Questions 1. MAIN CONCERN: What happened that made you call today?     Worsening depression 2. RISK OF HARM - SUICIDAL IDEATION:  Do you ever have thoughts of hurting or killing yourself?  (  e.g., yes, no, no but preoccupation with thoughts about death)   - WISH TO BE DEAD:  Have you wished you were dead or wished you could go to sleep and not wake up?yes   - INTENT:  Have you had any thoughts of hurting or killing yourself? (e.g., yes, no, N/A) If Yes, ask: Are you having these thoughts about killing yourself right NOW?no   - PLAN: Have you thought about how you might do this? Do you have a specific plan for how you would do this? (e.g., gun, knife, overdose, no plan, N/A)no plan    - ACCESS: If yes to PLAN, Do you have access to pills ? (e.g., pills, gun in house,  knife in kitchen)     yes 3. RISK OF HARM - SUICIDE ATTEMPT: Have you tried to harm yourself recently? If Yes, ask: When was this?  What type of harm was tried?     no 4. RISK OF HARM - SUICIDAL BEHAVIOR: Have you ever done anything, started to do anything, or prepared to do anything to end your life? (e.g., collected pills, bought a gun, wrote a suicide note, cut yourself, started but changed your mind)     No Afraid of pain chicken 5. EVENTS AND STRESSORS: Has there been any new stress or recent changes in your life? (e.g., death of loved one, homelessness, negative event, relationship breakup, work)     Husbands failing health and increased responsibilities 6. FUNCTIONAL IMPAIRMENT: How have things been going for you overall? Have you had more difficulty than usual doing your normal daily activities?  (e.g., better, same, worse; self-care, school, work, interactions)     Worse- unable to clean house 7. SUPPORT: Who is with you now? Who do you live with? Do you have family or friends who you can talk to?      husband 8. THERAPIST: Do you have a counselor or therapist? What is their name?     no 9. ALCOHOL USE OR SUBSTANCE USE (DRUG USE): Do you drink alcohol or use any illegal drugs (or prescription drugs in ways other than prescribed)?     no 10. OTHER: Do you have any other physical symptoms right now? (e.g., fever)       Poor sleep, husband thinks he is going to die that scares, fatigue  Protocols used: Depression-A-AH, Suicide Concerns-A-AH

## 2023-07-07 NOTE — Telephone Encounter (Signed)
 Pls sch OV if probems Thx

## 2023-07-08 ENCOUNTER — Ambulatory Visit: Admitting: Internal Medicine

## 2023-07-08 ENCOUNTER — Telehealth: Payer: Self-pay | Admitting: Internal Medicine

## 2023-07-08 NOTE — Telephone Encounter (Signed)
 Copied from CRM [id number]. Topic: General - Other >> Jul 08, 2023  7:44 AM Mesmerise C wrote: Reason for CRM: Patient stated she experienced a really bad fall yesterday and has been limping wants to cancel her appointment today and doesn't know when she can reschedule offered nurse but stated she doesn't want to speak to one if it gets bad stated she will get to urgent care did not cancel appointment, patient can be reached at [phone]  ---  Pt was sch for an increase dosage of her anti-depressants and possible SI.   Please contact pt and advise.  TDW

## 2023-07-13 ENCOUNTER — Other Ambulatory Visit: Payer: Self-pay | Admitting: Family

## 2023-07-13 ENCOUNTER — Other Ambulatory Visit: Payer: Self-pay | Admitting: Internal Medicine

## 2023-07-14 NOTE — Telephone Encounter (Signed)
 1st attempt to reach pt about provider's advice... Pt did not answer... Unable to lvm for pt of the following directions per pts PCP I am sorry about the fall.  Please reschedule with us  or go to ER to get checked, get x-rays etc. Thanks

## 2023-07-14 NOTE — Telephone Encounter (Signed)
 I am sorry about the fall.  Please reschedule with us  or go to ER to get checked, get x-rays etc. Thanks

## 2023-07-19 ENCOUNTER — Telehealth: Payer: Self-pay

## 2023-07-19 NOTE — Telephone Encounter (Signed)
 1st attempt to reach pt to inform of the following instructions for her Metformin ...   Pt is to take medication daily at breakfast time.

## 2023-07-19 NOTE — Telephone Encounter (Signed)
 Copied from CRM [id number]. Topic: Clinical - Medication Question >> Jul 19, 2023  2:27 PM Shereese L wrote: Reason for CRM: patient called and stated that she needs instruction on her medication metFORMIN  (GLUCOPHAGE ) 500 MG tablet and how often she's suppose to take it. Please give patient a call

## 2023-07-22 ENCOUNTER — Ambulatory Visit: Payer: Self-pay

## 2023-07-22 NOTE — Telephone Encounter (Unsigned)
 Copied from CRM [id number]. Topic: Clinical - Medication Question >> Jul 22, 2023 12:12 PM Suzen RAMAN wrote: Reason for CRM: Patient would like to someone to contact the pharmacy to correct medication instructions. Patient states recent medication prescribed on 04/21/23 is written for patient to take 2 pill per day despite what the script is written for per the provider.(Informed patient of current directions per chart-Metformin ) Confirmed with patient what instruction were listed on the bottle including the date prescribed and information above was provided.   [facility] [address] [place] - [address] [place] [address] Phone: [phone] Fax: [phone]

## 2023-07-22 NOTE — Telephone Encounter (Signed)
 FYI Only or Action Required?: FYI only for provider.  Patient was last seen in primary care on 05/18/2023 by [name], Karlynn GAILS, MD.  Called Nurse Triage reporting Depression.  Symptoms began ongoing problem.  Interventions attempted: Prescription medications: taking prescribed antidepressants.  Symptoms are: gradually worsening.  Triage Disposition: See Physician Within 24 Hours  Patient/caregiver understands and will follow disposition?: Yes   Patient requested to only see Dr. Garald. Earliest appointment she is available for is 7/17. I have scheduled this with the patient and instructed her to call back for new or worsening symptoms. Patient verbalized understanding an agreement of this plan.       Copied from CRM [id number]. Topic: Clinical - Red Word Triage >> Jul 22, 2023  1:39 PM Arm[place] J wrote: [facility] that prompted transfer to Nurse Triage: Patient states that she is very depressed and does not know what to do. She can't get anything done in the house and doesn't know if it's best to take more antidepressants. She is unmotivated and it's starting to scare her.    Reason for Disposition  [1] Depression AND [2] getting worse (e.g., sleeping poorly, less able to do activities of daily living)  Answer Assessment - Initial Assessment Questions 1. CONCERN: What happened that made you call today?     Feeling depressed  2. DEPRESSION SYMPTOM SCREENING: How are you feeling overall? (e.g., decreased energy, increased sleeping or difficulty sleeping, difficulty concentrating, feelings of sadness, guilt, hopelessness, or worthlessness)     Loss of motivation, difficulty concentrating  3. RISK OF HARM - SUICIDAL IDEATION:  Do you ever have thoughts of hurting or killing yourself?  (e.g., yes, no, no but preoccupation with thoughts about death)     No, but has thoughts of death  4. RISK OF HARM - HOMICIDAL IDEATION:  Do you ever have thoughts of hurting or killing someone  else?  (e.g., yes, no, no but preoccupation with thoughts about death)     No 5. FUNCTIONAL IMPAIRMENT: How have things been going for you overall? Have you had more difficulty than usual doing your normal daily activities?  (e.g., better, same, worse; self-care, school, work, interactions)     Difficulty doing normal activities.  6. SUPPORT: Who is with you now? Who do you live with? Do you have family or friends who you can talk to?      Yes 7. THERAPIST: Do you have a counselor or therapist? If Yes, ask: What is their name?     No 8. STRESSORS: Has there been any new stress or recent changes in your life?     Stressors at home  9. ALCOHOL USE OR SUBSTANCE USE (DRUG USE): Do you drink alcohol or use any illegal drugs?     No 10. OTHER: Do you have any other physical symptoms right now? (e.g., fever)       No  Protocols used: Depression-A-AH

## 2023-07-23 ENCOUNTER — Other Ambulatory Visit: Payer: Self-pay

## 2023-07-28 ENCOUNTER — Ambulatory Visit: Payer: Self-pay
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
## 2023-07-29 ENCOUNTER — Encounter: Payer: Self-pay | Admitting: Internal Medicine

## 2023-07-29 ENCOUNTER — Ambulatory Visit: Admitting: Internal Medicine

## 2023-07-29 VITALS — BP 128/63 | HR 70 | Temp 98.4°F | Ht 62.0 in

## 2023-07-29 DIAGNOSIS — E119 Type 2 diabetes mellitus without complications: Secondary | ICD-10-CM

## 2023-07-29 DIAGNOSIS — R413 Other amnesia: Secondary | ICD-10-CM

## 2023-07-29 DIAGNOSIS — I10 Essential (primary) hypertension: Secondary | ICD-10-CM

## 2023-07-29 DIAGNOSIS — F419 Anxiety disorder, unspecified: Secondary | ICD-10-CM

## 2023-07-29 DIAGNOSIS — F3340 Major depressive disorder, recurrent, in remission, unspecified: Secondary | ICD-10-CM

## 2023-07-29 DIAGNOSIS — E538 Deficiency of other specified B group vitamins: Secondary | ICD-10-CM | POA: Diagnosis not present

## 2023-07-29 MED ORDER — VILAZODONE HCL 20 MG PO TABS: 20.0000 mg | ORAL_TABLET | Freq: Every day | ORAL | 5 refills | Status: DC
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
## 2023-07-29 NOTE — Assessment & Plan Note (Signed)
 On metformin  500 mg a day CBG 100-120 Check A1c

## 2023-07-29 NOTE — Assessment & Plan Note (Signed)
 Cont w/Vit B12 B12 inj today

## 2023-07-29 NOTE — Assessment & Plan Note (Addendum)
 Aggravated by stress w/husband, big house, big yard Waiting on a Senior living condo to become available Talk to a psychologist - [facility] (open 24/7).  D/c Fluoxetine  Start Viibrid 20 mg a day

## 2023-07-29 NOTE — Assessment & Plan Note (Addendum)
 Worse On Prozac   Alprazolam  - rare use  Potential benefits of a long term benzodiazepines  use as well as potential risks  and complications were explained to the patient and were aknowledged.  Aggravated by stress w/husband, big house, big yard Waiting on a Senior living condo to become available Talk to a psychologist - [facility] (open 24/7).  07/2023 D/c Fluoxetine  Start Viibrid 20 mg a day

## 2023-07-29 NOTE — Assessment & Plan Note (Addendum)
 Aggravated by stress w/husband, big house, big yard Waiting on a Senior living condo to become available Talk to a psychologist - [facility] (open 24/7).  On Memantine  5 mg bid Will ref to see Dr Buck, Neurology

## 2023-07-29 NOTE — Patient Instructions (Addendum)
 Stop Fluoxetine  Start Viibrid 20 mg a day

## 2023-07-29 NOTE — Assessment & Plan Note (Signed)
Furosemide prn 

## 2023-07-29 NOTE — Telephone Encounter (Signed)
 Copied from CRM [id number]. Topic: General - Other >> Jul 29, 2023 12:29 PM [place] J wrote: Reason for CRM: Patient's husband is calling in to request a phone call after the patient's appointment today. The patient has dementia and he is needing to let Dr. Garald know something about her condition. She has an appointment for 1:40 PM today and would not let her husband attend the appointment with her.

## 2023-07-30 ENCOUNTER — Telehealth: Payer: Self-pay

## 2023-07-30 ENCOUNTER — Other Ambulatory Visit: Payer: Self-pay | Admitting: Internal Medicine

## 2023-07-30 NOTE — Telephone Encounter (Signed)
 Power of attorney paperwork was received on 7/17 and sent to medical records to be processed and placed in chart.   Copied from CRM [id number]. Topic: General - Other >> Jul 30, 2023 11:18 AM Deleta RAMAN wrote: Reason for CRM: Patient husband is calling to speak with pcp about power of attorney being faxed over. You can reached the patients husband at [phone]

## 2023-07-30 NOTE — Telephone Encounter (Signed)
 Seen on 07/29/23

## 2023-07-30 NOTE — Telephone Encounter (Signed)
 Copied from CRM [id number]. Topic: Clinical - Medication Refill >> Jul 30, 2023 10:43 AM [place] J wrote: Medication: oxyCODONE -acetaminophen  (PERCOCET) 10-325 MG tablet  Has the patient contacted their pharmacy? Yes (Agent: If no, request that the patient contact the pharmacy for the refill. If patient does not wish to contact the pharmacy document the reason why and proceed with request.) (Agent: If yes, when and what did the pharmacy advise?) Pharmacy informed the patient that she has to call primary to refill this medication.  This is the patient's preferred pharmacy:  [facility] [address] [place] - [address] [place] [address] Phone: [phone] Fax: [phone]  Is this the correct pharmacy for this prescription? Yes If no, delete pharmacy and type the correct one.   Has the prescription been filled recently? No  Is the patient out of the medication? No  Has the patient been seen for an appointment in the last year OR does the patient have an upcoming appointment? Yes  Can we respond through MyChart? No  Agent: Please be advised that Rx refills may take up to 3 business days. We ask that you follow-up with your pharmacy.

## 2023-08-05 ENCOUNTER — Other Ambulatory Visit: Payer: Self-pay | Admitting: Internal Medicine

## 2023-08-05 NOTE — Telephone Encounter (Signed)
 12/04/22 30 tabs/0 RF

## 2023-08-05 NOTE — Telephone Encounter (Signed)
 Copied from CRM #[id number]. Topic: Clinical - Medication Refill >> Aug 05, 2023 12:37 PM Carlyon D wrote: Medication: oxyCODONE -acetaminophen  (PERCOCET) 10-325 MG tablet  Has the patient contacted their pharmacy? Yes (Agent: If no, request that the patient contact the pharmacy for the refill. If patient does not wish to contact the pharmacy document the reason why and proceed with request.) (Agent: If yes, when and what did the pharmacy advise?)  This is the patient's preferred pharmacy:   [facility] [address] [place] - [address] [place] [address] Phone: [phone] Fax: [phone]  Is this the correct pharmacy for this prescription? Yes If no, delete pharmacy and type the correct one.   Has the prescription been filled recently? No  Is the patient out of the medication? Yes  Has the patient been seen for an appointment in the last year OR does the patient have an upcoming appointment? Yes  Can we respond through MyChart? Yes  Agent: Please be advised that Rx refills may take up to 3 business days. We ask that you follow-up with your pharmacy.

## 2023-08-09 DIAGNOSIS — F411 Generalized anxiety disorder: Secondary | ICD-10-CM | POA: Diagnosis not present

## 2023-08-13 ENCOUNTER — Other Ambulatory Visit: Payer: Self-pay | Admitting: Internal Medicine

## 2023-08-13 NOTE — Telephone Encounter (Signed)
 Copied from CRM [id number]. Topic: Clinical - Medication Refill >> Aug 13, 2023  2:33 PM [place] E wrote: Medication: oxyCODONE -acetaminophen  (PERCOCET) 10-325 MG tablet Vilazodone  HCl (VIIBRYD ) 20 MG TABS  Has the patient contacted their pharmacy? No (Agent: If no, request that the patient contact the pharmacy for the refill. If patient does not wish to contact the pharmacy document the reason why and proceed with request.) (Agent: If yes, when and what did the pharmacy advise?)  This is the patient's preferred pharmacy:  [facility] [address] [place] - [address] [place] [address] Phone: [phone] Fax: [phone]  Is this the correct pharmacy for this prescription? Yes If no, delete pharmacy and type the correct one.   Has the prescription been filled recently? Yes  Is the patient out of the medication? 1 pill left of the oxycodone  and 12 pills left of the Vilazodone .  Has the patient been seen for an appointment in the last year OR does the patient have an upcoming appointment? Yes  Can we respond through MyChart? No  Agent: Please be advised that Rx refills may take up to 3 business days. We ask that you follow-up with your pharmacy.

## 2023-08-18 ENCOUNTER — Ambulatory Visit: Admitting: Internal Medicine

## 2023-08-27 ENCOUNTER — Other Ambulatory Visit: Payer: Self-pay | Admitting: Internal Medicine

## 2023-08-27 NOTE — Telephone Encounter (Signed)
 Copied from CRM #[id number]. Topic: Clinical - Medication Refill >> Aug 27, 2023 11:01 AM Lavanda D wrote: Medication: oxyCODONE -acetaminophen  (PERCOCET) 10-325 MG tablet  Has the patient contacted their pharmacy? No (Agent: If no, request that the patient contact the pharmacy for the refill. If patient does not wish to contact the pharmacy document the reason why and proceed with request.) (Agent: If yes, when and what did the pharmacy advise?)  This is the patient's preferred pharmacy:  [facility] [address], [place] - [address] [place] [address] Phone: [phone] Fax: [phone]  Is this the correct pharmacy for this prescription? Yes If no, delete pharmacy and type the correct one.   Has the prescription been filled recently? No  Is the patient out of the medication? Yes  Has the patient been seen for an appointment in the last year OR does the patient have an upcoming appointment? Yes  Can we respond through MyChart? Yes  Agent: Please be advised that Rx refills may take up to 3 business days. We ask that you follow-up with your pharmacy.

## 2023-08-30 ENCOUNTER — Other Ambulatory Visit (INDEPENDENT_AMBULATORY_CARE_PROVIDER_SITE_OTHER)

## 2023-08-30 ENCOUNTER — Encounter: Payer: Self-pay | Admitting: Internal Medicine

## 2023-08-30 ENCOUNTER — Ambulatory Visit: Admitting: Internal Medicine

## 2023-08-30 VITALS — BP 138/78 | HR 73 | Temp 98.2°F | Ht 62.0 in | Wt 164.0 lb

## 2023-08-30 DIAGNOSIS — E538 Deficiency of other specified B group vitamins: Secondary | ICD-10-CM

## 2023-08-30 DIAGNOSIS — F329 Major depressive disorder, single episode, unspecified: Secondary | ICD-10-CM

## 2023-08-30 DIAGNOSIS — F419 Anxiety disorder, unspecified: Secondary | ICD-10-CM

## 2023-08-30 DIAGNOSIS — N183 Chronic kidney disease, stage 3 unspecified: Secondary | ICD-10-CM

## 2023-08-30 DIAGNOSIS — E119 Type 2 diabetes mellitus without complications: Secondary | ICD-10-CM

## 2023-08-30 DIAGNOSIS — E1122 Type 2 diabetes mellitus with diabetic chronic kidney disease: Secondary | ICD-10-CM

## 2023-08-30 LAB — COMPREHENSIVE METABOLIC PANEL WITH GFR
ALT: 11 U/L (ref 0–35)
AST: 15 U/L (ref 0–37)
Albumin: 4.1 g/dL (ref 3.5–5.2)
Alkaline Phosphatase: 43 U/L (ref 39–117)
BUN: 12 mg/dL (ref 6–23)
CO2: 28 meq/L (ref 19–32)
Calcium: 9.4 mg/dL (ref 8.4–10.5)
Chloride: 104 meq/L (ref 96–112)
Creatinine, Ser: 1.13 mg/dL (ref 0.40–1.20)
GFR: 46.71 mL/min — ABNORMAL LOW (ref >60.00)
Glucose, Bld: 86 mg/dL (ref 70–99)
Potassium: 4 meq/L (ref 3.5–5.1)
Sodium: 141 meq/L (ref 135–145)
Total Bilirubin: 0.6 mg/dL (ref 0.2–1.2)
Total Protein: 6.9 g/dL (ref 6.0–8.3)

## 2023-08-30 LAB — TSH: TSH: 1.11 u[IU]/mL (ref 0.35–5.50)

## 2023-08-30 LAB — HEMOGLOBIN A1C: Hgb A1c MFr Bld: 5.7 % (ref 4.6–6.5)

## 2023-08-30 MED ORDER — ONETOUCH ULTRASOFT LANCETS MISC: 12 refills | Status: DC

## 2023-08-30 MED ORDER — OXYCODONE-ACETAMINOPHEN 10-325 MG PO TABS: 1.0000 | ORAL_TABLET | Freq: Four times a day (QID) | ORAL | 0 refills | Status: DC | PRN

## 2023-08-30 NOTE — Assessment & Plan Note (Addendum)
 Stop Fluoxetine  Take Viibrid 20 mg a day    Seeing a psychologist She is afraid her husband would die - he is 14.

## 2023-08-30 NOTE — Assessment & Plan Note (Signed)
On Rx 

## 2023-08-30 NOTE — Assessment & Plan Note (Signed)
 F/u Dr Rayburn Cowper well GFR 46

## 2023-08-30 NOTE — Assessment & Plan Note (Signed)
 Cont w/Vit B12 B12 inj today

## 2023-08-30 NOTE — Patient Instructions (Addendum)
 Stop Fluoxetine  Take Viibrid 20 mg a day
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
## 2023-08-30 NOTE — Telephone Encounter (Unsigned)
 Copied from CRM #[id number]. Topic: Clinical - Prescription Issue >> Aug 30, 2023  3:33 PM [place] J wrote: Reason for CRM: Makayla calling from [facility] to let us  know that since they do not have previous history of the patient receiving oxyCODONE -acetaminophen  (PERCOCET) 10-325 MG tablet, they are needing the morphine  equivalence to stay a certain level. Being said, they are needing Dr. Garald to change the directions to a max of 3 tablets a day-- after this initial script change, the pharmacist said that it can be changed to the preferred directions afterwards.   The pharmacist did state that they were able to receive a verbal approval for the change is sending an e-script is too difficult.  Callback: [phone]

## 2023-08-31 ENCOUNTER — Other Ambulatory Visit: Payer: Self-pay | Admitting: Internal Medicine

## 2023-09-01 ENCOUNTER — Other Ambulatory Visit: Payer: Self-pay | Admitting: Internal Medicine

## 2023-09-02 ENCOUNTER — Telehealth: Payer: Self-pay

## 2023-09-02 NOTE — Telephone Encounter (Signed)
 Copied from CRM #[id number]. Topic: Clinical - Medication Question >> Sep 02, 2023  9:53 AM [place] E wrote: Reason for CRM: Mikayla from [facility] called in stating the patient's oxyCODONE -acetaminophen  (PERCOCET) 10-325 MG tablet needs to be noted with max 3 tablets a day, as Mikayla stated it is a higher dose. Callback number for [facility] is [phone].

## 2023-09-06 ENCOUNTER — Other Ambulatory Visit: Payer: Self-pay | Admitting: Internal Medicine

## 2023-09-06 ENCOUNTER — Telehealth: Payer: Self-pay

## 2023-09-06 MED ORDER — OXYCODONE-ACETAMINOPHEN 10-325 MG PO TABS: 1.0000 | ORAL_TABLET | Freq: Three times a day (TID) | ORAL | Status: DC | PRN

## 2023-09-06 NOTE — Telephone Encounter (Signed)
 Tried to reach pt to infomr her that her medication is at her pharmacy and her Vilazodone  HCl (VIIBRYD ) 20 MG TABS has 5 refills. Pt is needing to contact her pharmacy for refills.

## 2023-09-06 NOTE — Telephone Encounter (Signed)
 Okay.  Thanks.

## 2023-09-06 NOTE — Telephone Encounter (Signed)
 Patient asking for request of status of refill frequest for Viibryd  20 mg tablets to [facility] on [address] in [place].  Patient wants to ask also if PCP has changed mind on allowing patient to have Oxycodone . Please advise.    Copied from CRM [id number]. Topic: Clinical - Medication Refill >> Sep 06, 2023 12:15 PM Mercedes MATSU wrote: Medication:  Vilazodone  HCl (VIIBRYD ) 20 MG TABS   Has the patient contacted their pharmacy? Yes (Agent: If no, request that the patient contact the pharmacy for the refill. If patient does not wish to contact the pharmacy document the reason why and proceed with request.) (Agent: If yes, when and what did the pharmacy advise?)  This is the patient's preferred pharmacy:  [facility] [address] [place] - [address] [place] [address] Phone: [phone] Fax: [phone]  Is this the correct pharmacy for this prescription? Yes If no, delete pharmacy and type the correct one.   Has the prescription been filled recently? Yes  Is the patient out of the medication? Yes  Has the patient been seen for an appointment in the last year OR does the patient have an upcoming appointment? Yes  Can we respond through MyChart? Yes  Agent: Please be advised that Rx refills may take up to 3 business days. We ask that you follow-up with your pharmacy. >> Sep 06, 2023 12:18 PM Mercedes MATSU wrote: oxyCODONE -acetaminophen  (PERCOCET) 10-325 MG tablet

## 2023-09-06 NOTE — Telephone Encounter (Unsigned)
 Copied from CRM [id number]. Topic: Clinical - Medication Refill >> Sep 06, 2023 12:15 PM Mercedes MATSU wrote: Medication:  Vilazodone  HCl (VIIBRYD ) 20 MG TABS   Has the patient contacted their pharmacy? Yes (Agent: If no, request that the patient contact the pharmacy for the refill. If patient does not wish to contact the pharmacy document the reason why and proceed with request.) (Agent: If yes, when and what did the pharmacy advise?)  This is the patient's preferred pharmacy:  [facility] [address], [place] - [address] [place] [address] Phone: [phone] Fax: [phone]  Is this the correct pharmacy for this prescription? Yes If no, delete pharmacy and type the correct one.   Has the prescription been filled recently? Yes  Is the patient out of the medication? Yes  Has the patient been seen for an appointment in the last year OR does the patient have an upcoming appointment? Yes  Can we respond through MyChart? Yes  Agent: Please be advised that Rx refills may take up to 3 business days. We ask that you follow-up with your pharmacy.

## 2023-09-06 NOTE — Addendum Note (Signed)
 Addended by: Brittney Caraway V on: 09/06/2023 07:44 AM   Modules accepted: Orders

## 2023-09-07 MED ORDER — VILAZODONE HCL 20 MG PO TABS: 20.0000 mg | ORAL_TABLET | Freq: Every day | ORAL | 5 refills | Status: DC

## 2023-09-09 DIAGNOSIS — F411 Generalized anxiety disorder: Secondary | ICD-10-CM | POA: Diagnosis not present

## 2023-09-10 ENCOUNTER — Telehealth: Payer: Self-pay

## 2023-09-10 NOTE — Telephone Encounter (Signed)
 Copied from CRM #[id number]. Topic: General - Other >> Sep 10, 2023  1:15 PM Macario HERO wrote: Reason for CRM: Patient spouse called requesting a call back from Dr. Garald. Call back: [phone]

## 2023-09-10 NOTE — Telephone Encounter (Signed)
 Tried calling pts spouse. No answer lvm to call the clinic back.

## 2023-09-14 DIAGNOSIS — F411 Generalized anxiety disorder: Secondary | ICD-10-CM | POA: Diagnosis not present

## 2023-09-15 ENCOUNTER — Telehealth: Payer: Self-pay | Admitting: Radiology

## 2023-09-15 NOTE — Telephone Encounter (Signed)
 Copied from CRM #[id number]. Topic: General - Other >> Sep 15, 2023  2:36 PM Burnard DEL wrote: Reason for CRM: Dr Karenann patients  psychologist called stating that patient needs an increase of her sleeping medicine donepezil  (ARICEPT ) 5 MG tablet to help her sleep. She also  stated that the patient is not suicidal and is doing great in therapy.

## 2023-09-16 NOTE — Telephone Encounter (Signed)
 I am glad to hear that she is doing great with therapy. She takes donepezil  for memory.  Please keep follow-up appointment with me.  Thanks

## 2023-09-20 ENCOUNTER — Telehealth: Payer: Self-pay

## 2023-09-20 NOTE — Telephone Encounter (Signed)
 Copied from CRM [id number]. Topic: General - Other >> Sep 20, 2023 10:38 AM Tiffany Gibbs wrote: Reason for CRM: Patient called in regarding her results regarding her brain study, would like for it to be mailed to her home address for her insurance

## 2023-09-21 ENCOUNTER — Telehealth: Payer: Self-pay | Admitting: Radiology

## 2023-09-21 NOTE — Telephone Encounter (Signed)
 Copied from CRM [id number]. Topic: Clinical - Medical Advice >> Sep 21, 2023 11:32 AM Tiffany Gibbs I wrote: Reason for CRM: Patient would like to know if its okay for her to take another flu shot because [facility] sent her a message and she wanted to know if she should take it with them or if she needs to take office

## 2023-09-28 ENCOUNTER — Telehealth: Payer: Self-pay | Admitting: Radiology

## 2023-09-28 DIAGNOSIS — F411 Generalized anxiety disorder: Secondary | ICD-10-CM | POA: Diagnosis not present

## 2023-09-28 NOTE — Telephone Encounter (Signed)
 Copied from CRM [id number]. Topic: General - Other >> Sep 20, 2023 10:38 AM Tiffany Gibbs wrote: Reason for CRM: Patient called in regarding her results regarding her brain study, would like for it to be mailed to her home address for her insurance >> Sep 28, 2023  2:23 PM Tiffany Gibbs wrote: Patient is calling in again for the results of the CT scan of her head from February.  Please advise the patient

## 2023-09-29 ENCOUNTER — Encounter: Payer: Self-pay | Admitting: Internal Medicine

## 2023-09-29 ENCOUNTER — Ambulatory Visit (INDEPENDENT_AMBULATORY_CARE_PROVIDER_SITE_OTHER): Admitting: Internal Medicine

## 2023-09-29 VITALS — BP 124/70 | HR 73 | Temp 98.4°F | Ht 64.0 in | Wt 168.4 lb

## 2023-09-29 DIAGNOSIS — G4709 Other insomnia: Secondary | ICD-10-CM

## 2023-09-29 DIAGNOSIS — Z7984 Long term (current) use of oral hypoglycemic drugs: Secondary | ICD-10-CM

## 2023-09-29 DIAGNOSIS — M5440 Lumbago with sciatica, unspecified side: Secondary | ICD-10-CM | POA: Diagnosis not present

## 2023-09-29 DIAGNOSIS — E119 Type 2 diabetes mellitus without complications: Secondary | ICD-10-CM | POA: Diagnosis not present

## 2023-09-29 DIAGNOSIS — F4321 Adjustment disorder with depressed mood: Secondary | ICD-10-CM

## 2023-09-29 DIAGNOSIS — E538 Deficiency of other specified B group vitamins: Secondary | ICD-10-CM | POA: Diagnosis not present

## 2023-09-29 DIAGNOSIS — Z23 Encounter for immunization: Secondary | ICD-10-CM | POA: Diagnosis not present

## 2023-09-29 DIAGNOSIS — N183 Chronic kidney disease, stage 3 unspecified: Secondary | ICD-10-CM | POA: Diagnosis not present

## 2023-09-29 MED ORDER — TRAZODONE HCL 50 MG PO TABS: 50.0000 mg | ORAL_TABLET | Freq: Every day | ORAL | 5 refills | Status: DC

## 2023-09-29 NOTE — Assessment & Plan Note (Signed)
 F/u Dr Rayburn Cowper well GFR 46

## 2023-09-29 NOTE — Assessment & Plan Note (Signed)
 Worse Will try Trazodone  at hs
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
## 2023-09-29 NOTE — Assessment & Plan Note (Signed)
 Chronic  Statin intolerant Cont on  Metformin , Rybelsus 

## 2023-09-29 NOTE — Assessment & Plan Note (Signed)
 On B12

## 2023-09-29 NOTE — Assessment & Plan Note (Signed)
 Chronic Percocet 10/325 prn  Potential benefits of a short/long term opioids use as well as potential risks (i.e. addiction risk, apnea etc) and complications (i.e. Somnolence, constipation and others) were explained to the patient and were aknowledged.

## 2023-09-29 NOTE — Addendum Note (Signed)
 Addended by: BEVELY CURTISTINE PARAS on: 09/29/2023 03:08 PM   Modules accepted: Orders

## 2023-09-29 NOTE — Assessment & Plan Note (Signed)
 Doing better Will add Trazodone  for insomnia

## 2023-10-04 NOTE — Telephone Encounter (Unsigned)
 Copied from CRM [id number]. Topic: Clinical - Medical Advice >> Oct 04, 2023 11:33 AM Harlene ORN wrote: Reason for CRM: Patient's husband and his Gabriella called to ask why he was told that he doesnt have access to know his wife's information. Upon looking further, it's confirmed that the patient's husband is on the patient's DPR.  If any questions or comments, please call back the husband: [phone]

## 2023-10-05 DIAGNOSIS — F411 Generalized anxiety disorder: Secondary | ICD-10-CM | POA: Diagnosis not present

## 2023-10-07 DIAGNOSIS — B349 Viral infection, unspecified: Secondary | ICD-10-CM | POA: Diagnosis not present

## 2023-10-07 DIAGNOSIS — E119 Type 2 diabetes mellitus without complications: Secondary | ICD-10-CM | POA: Diagnosis not present

## 2023-10-07 DIAGNOSIS — R519 Headache, unspecified: Secondary | ICD-10-CM | POA: Diagnosis not present

## 2023-10-08 ENCOUNTER — Other Ambulatory Visit: Payer: Self-pay | Admitting: Internal Medicine

## 2023-10-08 NOTE — Telephone Encounter (Signed)
 I have no clue about what is going on here.

## 2023-10-11 ENCOUNTER — Telehealth: Payer: Self-pay | Admitting: Internal Medicine

## 2023-10-11 NOTE — Telephone Encounter (Unsigned)
 Copied from CRM [id number]. Topic: Clinical - Prescription Issue >> Oct 11, 2023  3:12 PM [place] E wrote: Reason for CRM: Patient called in and stated she recently went to the ER (agent could not locate note) and was prescribed a medication for pain and needed this refilled. Patient stated that it is a 500mg  sodium tablet, but agent could not locate this medication on med list to refill.

## 2023-10-14 ENCOUNTER — Other Ambulatory Visit: Payer: Self-pay | Admitting: Internal Medicine

## 2023-10-14 NOTE — Telephone Encounter (Signed)
 Copied from CRM #[id number]. Topic: Clinical - Medication Refill >> Oct 14, 2023 11:02 AM Berneda FALCON wrote: Medication:  traZODone  (DESYREL ) 50 MG tablet Vilazodone  HCl (VIIBRYD ) 20 MG TABS ALPRAZolam  (XANAX ) 1 MG tablet   Please note, patient requested another refill before letting me know she needed these medications as well.  Has the patient contacted their pharmacy? Yes (Agent: If no, request that the patient contact the pharmacy for the refill. If patient does not wish to contact the pharmacy document the reason why and proceed with request.) (Agent: If yes, when and what did the pharmacy advise?)  This is the patient's preferred pharmacy:  [facility] [address] [place] - [address] [place] [address] Phone: [phone] Fax: [phone]   Is this the correct pharmacy for this prescription? Yes If no, delete pharmacy and type the correct one.   Has the prescription been filled recently? No  Is the patient out of the medication? No  Has the patient been seen for an appointment in the last year OR does the patient have an upcoming appointment? Yes  Can we respond through MyChart? No  Agent: Please be advised that Rx refills may take up to 3 business days. We ask that you follow-up with your pharmacy.

## 2023-10-14 NOTE — Telephone Encounter (Signed)
 Copied from CRM #[id number]. Topic: Clinical - Medication Refill >> Oct 14, 2023 11:00 AM Berneda FALCON wrote: Medication: oxyCODONE -acetaminophen  (PERCOCET) 10-325 MG tablet  Has the patient contacted their pharmacy? Yes, they told her to call us  (Agent: If no, request that the patient contact the pharmacy for the refill. If patient does not wish to contact the pharmacy document the reason why and proceed with request.) (Agent: If yes, when and what did the pharmacy advise?)  This is the patient's preferred pharmacy:  [facility] [address], [place] - [address] [place] [address] Phone: [phone] Fax: [phone]  Is this the correct pharmacy for this prescription? Yes If no, delete pharmacy and type the correct one.   Has the prescription been filled recently? No  Is the patient out of the medication? No  Has the patient been seen for an appointment in the last year OR does the patient have an upcoming appointment? Yes  Can we respond through MyChart? No, please call  Agent: Please be advised that Rx refills may take up to 3 business days. We ask that you follow-up with your pharmacy.

## 2023-10-17 MED ORDER — OXYCODONE-ACETAMINOPHEN 10-325 MG PO TABS: 1.0000 | ORAL_TABLET | Freq: Three times a day (TID) | ORAL | Status: DC | PRN

## 2023-10-17 MED ORDER — TRAZODONE HCL 50 MG PO TABS: 50.0000 mg | ORAL_TABLET | Freq: Every day | ORAL | 5 refills | Status: DC

## 2023-10-17 MED ORDER — ALPRAZOLAM 1 MG PO TABS: 1.0000 mg | ORAL_TABLET | Freq: Two times a day (BID) | ORAL | 0 refills | Status: DC | PRN

## 2023-10-17 MED ORDER — VILAZODONE HCL 20 MG PO TABS: 20.0000 mg | ORAL_TABLET | Freq: Every day | ORAL | 5 refills | Status: AC

## 2023-10-18 ENCOUNTER — Telehealth: Payer: Self-pay | Admitting: Internal Medicine

## 2023-10-18 ENCOUNTER — Encounter: Payer: Self-pay | Admitting: Diagnostic Neuroimaging

## 2023-10-18 ENCOUNTER — Ambulatory Visit (INDEPENDENT_AMBULATORY_CARE_PROVIDER_SITE_OTHER): Admitting: Diagnostic Neuroimaging

## 2023-10-18 VITALS — BP 126/70 | HR 85 | Ht 64.0 in | Wt 162.6 lb

## 2023-10-18 DIAGNOSIS — R413 Other amnesia: Secondary | ICD-10-CM | POA: Diagnosis not present

## 2023-10-18 NOTE — Telephone Encounter (Signed)
 Let her know what this prescription is.  Thank you

## 2023-10-18 NOTE — Telephone Encounter (Signed)
 Per request of [facility] provider, called Dr. Wanda Furlong to schedule patient a follow up appointment. Advised byDr. Wanda Furlong,  patient is scheduled every two weeks for appointment. Next appointment is Wednesday October 20, 2023 at 3:00pm.

## 2023-10-18 NOTE — Progress Notes (Signed)
 [facility]  PATIENT: Tiffany Gibbs DOB: 11-Dec-1945  REFERRING CLINICIAN: [name], Karlynn GAILS, MD HISTORY FROM: patient  REASON FOR VISIT: new consult    HISTORICAL  CHIEF COMPLAINT:  Chief Complaint  Patient presents with   RM 7     Patient is here for Memory - has had 2 brain test months ago for pain ( head, neck , back, and side) picked up a germ from school. All scans were normal  MMSE 21     HISTORY OF PRESENT ILLNESS:   UPDATE (10/18/23, VRP): 78 year old female here for evaluation of memory loss. Symptoms gradual and progressive since last 1-2 years. Lots of stress related to depression and caregiver stress. Does not remember going to psychology appointment. Has son and step son in [place].   UPDATE (07/28/21, VRP): Since last visit, doing about the same. More issues with feeling the car pedals. Severity is moderate. No alleviating or aggravating factors. Tolerating meds.    UPDATE (06/06/19, VRP): Since last visit, doing well and HA are resolved (now on CPAP for OSA).  Now having leg cramps, numbness in legs since 2017. Symptoms are intermittent (night time). Severity is moderate. No alleviating or aggravating factors. Could not tolerate gabapentin  or lyrica .   UPDATE 06/12/15: Since last visit, headaches are improved. Dizzy spells are faint and rare (~1 per week, 1 second). Meds are being adjusted. Now back on CPAP x few weeks.    PRIOR HPI (04/30/15): 78 year old right-handed female here for evaluation of headaches and dizziness. Patient has history of migraine headaches in her 73s with severe global headaches with photophobia. No nausea vomiting. Headaches seem to resolve by the time she was 78 years old. Since February 2017 patient has had intermittent lightheaded sensations, room spinning sensations, dizzy spells lasting a few seconds at a time. Symptoms would occur a few times per week. Positional changes would seem to trigger headaches such as  turning, bending, sitting up or standing up. No nausea or vomiting. No passing out, slurred speech, trouble talking, double vision. Patient would have some balance difficulties and feeling like she could fall down and have to hold onto something. In April 2017 patient was at [facility] and had a severe episode that scared her. In addition since November 2016 patient has been having increasing increasing right-sided headaches, right-sided neck pain, right occipital pain with pounding sensation lasting for 2 hours. No nausea or vomiting. No photophobia or phonophobia. Patient having at least 2 headaches per month.   REVIEW OF SYSTEMS: Full 14 system review of systems performed and negative with exception of: as per HPI.  ALLERGIES: Allergies  Allergen Reactions   Aricept  [Donepezil ]     Cramps   Celecoxib     swell   Diazepam      Does not agree with me   Enalapril     cough   Fluoxetine      Increased appetite   Gabapentin  Other (See Comments)    dizziness   Losartan      falls   Lovastatin     REACTION: aches   Lyrica  [Pregabalin ] Other (See Comments)    dizziness   Naproxen    Pravachol  [Pravastatin  Sodium]     Nausea    Zetia  [Ezetimibe ]     Myalgias in LEs    HOME MEDICATIONS: Outpatient Medications Prior to Visit  Medication Sig Dispense Refill   Blood Glucose Monitoring Suppl (ONETOUCH VERIO) w/Device KIT Use to check blood sugar daily. DX E11.9  1 kit 0   Cyanocobalamin  (B-12) 5000 MCG CAPS Take 1 tablet by mouth once a week.      diphenhydrAMINE  (BENADRYL ) 25 mg capsule Take 25 mg by mouth daily as needed (allergies).      donepezil  (ARICEPT ) 5 MG tablet Take 1 tablet (5 mg total) by mouth at bedtime. 30 tablet 5   fluticasone  (FLONASE ) 50 MCG/ACT nasal spray Place 2 sprays into both nostrils daily. 16 g 6   glucose blood (ONETOUCH VERIO) test strip USE 1 STRIP TO CHECK GLUCOSE TWICE DAILY AS NEEDED 200 each 3   Lancets (ONETOUCH ULTRASOFT) lancets Use as  instructed 100 each 12   loratadine  (CLARITIN ) 10 MG tablet Take 1 tablet (10 mg total) by mouth daily. 30 tablet 11   metFORMIN  (GLUCOPHAGE ) 500 MG tablet Take 1 tablet (500 mg total) by mouth daily with breakfast.     Omega-3 Fatty Acids (FISH OIL) 1200 MG CAPS Take 1 capsule by mouth in the morning and at bedtime.     oxyCODONE -acetaminophen  (PERCOCET) 10-325 MG tablet Take 1 tablet by mouth every 8 (eight) hours as needed for pain.     ALPRAZolam  (XANAX ) 1 MG tablet Take 1 tablet (1 mg total) by mouth 2 (two) times daily as needed. for anxiety 60 tablet 0   Cholecalciferol (VITAMIN D3) 50 MCG (2000 UT) CAPS Take 1 capsule (2,000 Units total) by mouth daily. 100 capsule 3   magnesium  oxide (MAG-OX) 400 MG tablet Take 400 mg by mouth daily.     memantine  (NAMENDA ) 5 MG tablet Take 1 tablet by mouth twice daily 180 tablet 3   potassium chloride  (KLOR-CON ) 8 MEQ tablet Take 1 tablet by mouth twice daily 180 tablet 1   Probiotic Product (ALIGN) 4 MG CAPS Take 1 capsule (4 mg total) by mouth daily. 30 capsule 0   RYBELSUS  7 MG TABS Take 1 tablet by mouth daily.     traZODone  (DESYREL ) 50 MG tablet Take 1-2 tablets (50-100 mg total) by mouth at bedtime. 60 tablet 5   Vilazodone  HCl (VIIBRYD ) 20 MG TABS Take 1 tablet (20 mg total) by mouth daily. 30 tablet 5   No facility-administered medications prior to visit.    PAST MEDICAL HISTORY: Past Medical History:  Diagnosis Date   Anxiety    Cataract    Depression    DM type 2 (diabetes mellitus, type 2) (HCC)    Fibromyalgia    H/O dizziness    Hives    from tomatoes   HTN (hypertension)    Hyperlipidemia    Internal hemorrhoids    Kidney disease    Low back pain    OSA (obstructive sleep apnea)    uses c-pap   Osteoarthritis    Osteoporosis    SOB (shortness of breath) on exertion    Tubular adenoma of colon     PAST SURGICAL HISTORY: Past Surgical History:  Procedure Laterality Date   cervical cryotherapy     cervix    CYSTOCELE REPAIR N/A 11/01/2018   Procedure: CYSTOSCOPY ANTERIOR REPAIR (CYSTOCELE);  Surgeon: Gaston Hamilton, MD;  Location: [facility];  Service: Urology;  Laterality: N/A;   DRUG INDUCED ENDOSCOPY N/A 08/14/2020   Procedure: DRUG INDUCED ENDOSCOPY;  Surgeon: Mable Lenis, MD;  Location:  SURGERY CENTER;  Service: ENT;  Laterality: N/A;   EYE SURGERY     Cataracts/Bil   LASIK Bilateral    TOTAL ABDOMINAL HYSTERECTOMY     TOTAL HIP ARTHROPLASTY  09/2008  right- [place]/   TOTAL HIP ARTHROPLASTY Left 11/28/2012   Procedure: TOTAL HIP ARTHROPLASTY;  Surgeon: Dempsey JINNY Sensor, MD;  Location: [facility];  Service: Orthopedics;  Laterality: Left;    FAMILY HISTORY: Family History  Problem Relation Age of Onset   Hypertension Mother    Diabetes Mother    Other Mother        DVT   Heart disease Father    Multiple sclerosis Father    Heart attack Father    Kidney disease Brother 36       ESRD    SOCIAL HISTORY: Social History   Socioeconomic History   Marital status: Married    Spouse name: Sam   Number of children: 1   Years of education: 12    Highest education level: Not on file  Occupational History   Occupation: N/A  Tobacco Use   Smoking status: Former    Current packs/day: 0.00    Average packs/day: 2.0 packs/day for 25.0 years (50.0 ttl pk-yrs)    Types: Cigarettes    Start date: 01/12/1958    Quit date: 01/13/1983    Years since quitting: 40.7   Smokeless tobacco: Never  Vaping Use   Vaping status: Never Used  Substance and Sexual Activity   Alcohol use: No    Alcohol/week: 0.0 standard drinks of alcohol   Drug use: No   Sexual activity: Not Currently    Birth control/protection: Post-menopausal, Surgical  Other Topics Concern   Not on file  Social History Narrative   Was divorced - newly re-married as of 2009, Sam   1 son      Caffeine use- coffee 1 cup daily    Social Drivers of Corporate investment banker Strain: Low Risk  (11/05/2022)   Overall  Financial Resource Strain (CARDIA)    Difficulty of Paying Living Expenses: Not hard at all  Food Insecurity: No Food Insecurity (11/05/2022)   Hunger Vital Sign    Worried About Running Out of Food in the Last Year: Never true    Ran Out of Food in the Last Year: Never true  Transportation Needs: No Transportation Needs (11/05/2022)   PRAPARE - Administrator, Civil Service (Medical): No    Lack of Transportation (Non-Medical): No  Physical Activity: Inactive (11/05/2022)   Exercise Vital Sign    Days of Exercise per Week: 0 days    Minutes of Exercise per Session: 0 min  Stress: No Stress Concern Present (11/05/2022)   [employer] of Occupational Health - Occupational Stress Questionnaire    Feeling of Stress : Not at all  Social Connections: Socially Integrated (11/05/2022)   Social Connection and Isolation Panel    Frequency of Communication with Friends and Family: More than three times a week    Frequency of Social Gatherings with Friends and Family: More than three times a week    Attends Religious Services: More than 4 times per year    Active Member of [employer] or Organizations: Yes    Attends Banker Meetings: More than 4 times per year    Marital Status: Married  Catering manager Violence: Not At Risk (11/05/2022)   Humiliation, Afraid, Rape, and Kick questionnaire    Fear of Current or Ex-Partner: No    Emotionally Abused: No    Physically Abused: No    Sexually Abused: No     PHYSICAL EXAM  GENERAL EXAM/CONSTITUTIONAL: Vitals:  Vitals:   10/18/23 1433  BP: 126/70  Pulse: 85  SpO2: 98%  Weight: 162 lb 9.6 oz (73.8 kg)  Height: 5' 4 (1.626 m)    Body mass index is 27.91 kg/m. Wt Readings from Last 3 Encounters:  10/18/23 162 lb 9.6 oz (73.8 kg)  09/29/23 168 lb 6.4 oz (76.4 kg)  08/30/23 164 lb (74.4 kg)   Patient is in no distress; well developed, nourished and groomed; neck is supple  CARDIOVASCULAR: Examination of  carotid arteries is normal; no carotid bruits Regular rate and rhythm, no murmurs Examination of peripheral vascular system by observation and palpation is normal  EYES: Ophthalmoscopic exam of optic discs and posterior segments is normal; no papilledema or hemorrhages No results found.  MUSCULOSKELETAL: Gait, strength, tone, movements noted in Neurologic exam below  NEUROLOGIC: MENTAL STATUS:     10/18/2023    3:05 PM 11/05/2022    2:20 PM 11/19/2014    1:08 PM  MMSE - Mini Mental State Exam  Not completed:   --  Orientation to time 4 5   Orientation to Place 5 5   Registration 3 3   Attention/ Calculation 0 5   Recall 1 3   Language- name 2 objects 2 2   Language- repeat 1 1   Language- follow 3 step command 3 3   Language- read & follow direction 1 1   Write a sentence 1 1   Copy design 0 1   Total score 21 30        No data to display          awake, alert, oriented to person, place and time recent and remote memory intact normal attention and concentration language fluent, comprehension intact, naming intact fund of knowledge appropriate  CRANIAL NERVE:  2nd - no papilledema on fundoscopic exam 2nd, 3rd, 4th, 6th - pupils equal and reactive to light, visual fields full to confrontation, extraocular muscles intact, no nystagmus 5th - facial sensation symmetric 7th - facial strength symmetric 8th - hearing intact 9th - palate elevates symmetrically, uvula midline 11th - shoulder shrug symmetric 12th - tongue protrusion midline  MOTOR:  normal bulk and tone, full strength in the BUE, BLE  SENSORY:  normal and symmetric to light touch, pinprick, temperature, vibration; EXCEPT DECR VIB AT TOES  COORDINATION:  finger-nose-finger, fine finger movements normal  REFLEXES:  deep tendon reflexes TRACE and symmetric  GAIT/STATION:  narrow based gait     DIAGNOSTIC DATA (LABS, IMAGING, TESTING) - I reviewed patient records, labs, notes, testing and  imaging myself where available.  Lab Results  Component Value Date   WBC 6.4 02/15/2023   HGB 14.1 02/15/2023   HCT 42.4 02/15/2023   MCV 91.4 02/15/2023   PLT 246.0 02/15/2023      Component Value Date/Time   NA 141 08/30/2023 1204   K 4.0 08/30/2023 1204   CL 104 08/30/2023 1204   CO2 28 08/30/2023 1204   GLUCOSE 86 08/30/2023 1204   BUN 12 08/30/2023 1204   CREATININE 1.13 08/30/2023 1204   CREATININE 0.99 (H) 08/29/2019 0814   CALCIUM 9.4 08/30/2023 1204   PROT 6.9 08/30/2023 1204   PROT 7.5 06/06/2019 1350   ALBUMIN 4.1 08/30/2023 1204   AST 15 08/30/2023 1204   ALT 11 08/30/2023 1204   ALKPHOS 43 08/30/2023 1204   BILITOT 0.6 08/30/2023 1204   GFRNONAA >60 08/09/2020 1306   GFRNONAA 56 (L) 08/29/2019 0814   GFRAA 65 08/29/2019 0814   Lab Results  Component Value Date  CHOL 209 (H) 03/10/2022   HDL 74.10 03/10/2022   LDLCALC 120 (H) 03/10/2022   LDLDIRECT 148.2 02/03/2013   TRIG 71.0 03/10/2022   CHOLHDL 3 03/10/2022   Lab Results  Component Value Date   HGBA1C 5.7 08/30/2023   Lab Results  Component Value Date   VITAMINB12 >1537 (H) 02/15/2023   Lab Results  Component Value Date   TSH 1.11 08/30/2023    06/06/19 neuropathy labs - SPEP, ANA, ANCA, HIV, RPR, HEP B, HEP C, B1, B6 --> normal  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  05/13/15 MRI brain  - normal  05/13/15 MRA head / neck  - normal  07/12/19 MRI lumbar spine (without) demonstrating: - At L4-5: disc bulging and facet hypertrophy with no spinal stenosis or foraminal narrowing. - Right renal cyst (4.3cm).   02/23/23 CT head [I reviewed images myself and agree with interpretation. -VRP]  - No CT evidence of acute intracranial abnormality. - Mild parenchymal volume loss without lobar specific atrophy.   ASSESSMENT AND PLAN  78 y.o. year old female here with:  Dx:  1. Memory loss     PLAN:  MEMORY LOSS (MMSE 21/30; mild changes in ADLs; could be mild dementia vs  pseudo-dementia of depression / stress) - check MRI and lab testing  - follow up with PCP and psychology for depression - try to stay active physically and get some exercise (at least 15-30 minutes per day) - eat a nutritious diet with lean protein, plants / vegetables, whole grains; avoid ultra-processed foods - increase social activities, brain stimulation, games, puzzles, hobbies, crafts, arts, music; try new activities; keep it fun! - aim for at least 7-8 hours sleep per night (or more) - avoid smoking and alcohol - caution with medications, finances, driving - safety / supervision issues reviewed  MUSCLE CRAMPS / IDIOPATHIC NEUROPATHY (extensive workup in 2021 negative) - tried and failed gabapentin , lyrica ; consider duloxetine - continue supportive care  Orders Placed This Encounter  Procedures   MR BRAIN W WO CONTRAST   ATN PROFILE   Return for pending if symptoms worsen or fail to improve, pending test results.    EDUARD FABIENE HANLON, MD 10/18/2023, 3:20 PM Certified in Neurology, Neurophysiology and Neuroimaging  [facility] [address] [place], [place] [address] [phone]

## 2023-10-18 NOTE — Patient Instructions (Signed)
  MEMORY LOSS (MMSE 21/30; mild changes in ADLs; could be mild dementia vs pseudo-dementia of depression / stress) - check MRI and lab testing  - follow up with PCP and psychology for depression - try to stay active physically and get some exercise (at least 15-30 minutes per day) - eat a nutritious diet with lean protein, plants / vegetables, whole grains; avoid ultra-processed foods - increase social activities, brain stimulation, games, puzzles, hobbies, crafts, arts, music; try new activities; keep it fun! - aim for at least 7-8 hours sleep per night (or more) - avoid smoking and alcohol - caution with medications, finances, driving - safety / supervision issues reviewed

## 2023-10-19 ENCOUNTER — Telehealth: Payer: Self-pay | Admitting: Diagnostic Neuroimaging

## 2023-10-19 NOTE — Telephone Encounter (Signed)
 MRI order sent to [facility] they will get the [employer]. [phone]

## 2023-10-19 NOTE — Telephone Encounter (Signed)
 Pt states as she was leaving from the appointment a RN approached her saying she needed to talk to her, pt said she told the RN she couldn't talk.  Pt asking that if the RN that needed to speak with her recalls what the matter was about to please call her.

## 2023-10-20 DIAGNOSIS — F411 Generalized anxiety disorder: Secondary | ICD-10-CM | POA: Diagnosis not present

## 2023-10-21 LAB — ATN PROFILE
A -- Beta-amyloid 42/40 Ratio: 0.106 (ref >0.102)
Beta-amyloid 40: 184.17 pg/mL
Beta-amyloid 42: 19.56 pg/mL
N -- NfL, Plasma: 5.63 pg/mL (ref 0.00–6.04)
T -- p-tau181: 2.29 pg/mL — AB (ref 0.00–0.97)

## 2023-10-21 NOTE — Telephone Encounter (Signed)
 Patient was informed and patient verbalized understanding.

## 2023-10-26 ENCOUNTER — Ambulatory Visit: Payer: Self-pay | Admitting: Diagnostic Neuroimaging

## 2023-10-27 NOTE — Telephone Encounter (Signed)
 Pt returned call. Please call back on cell phone [phone]

## 2023-10-27 NOTE — Telephone Encounter (Signed)
LMVM for pt to return call at her convenience for lab results.

## 2023-10-27 NOTE — Telephone Encounter (Signed)
-----   Message from Eduard SAUNDERS [facility] sent at 10/26/2023  6:12 PM EDT ----- Lab results are not consistent with Alzheimer's related pathology, but may indicate other type of neurodegenerative condition.  Continue current memory loss treatment plan as discussed in last visit. ----- Message ----- From: Interface, [facility] Lab Results In Sent: 10/20/2023  11:36 AM EDT To: Eduard SAUNDERS Hanlon, MD

## 2023-10-29 NOTE — Telephone Encounter (Signed)
 Pt has returned call to RN, please call when available.

## 2023-11-03 ENCOUNTER — Encounter: Payer: Self-pay | Admitting: Diagnostic Neuroimaging

## 2023-11-04 DIAGNOSIS — F411 Generalized anxiety disorder: Secondary | ICD-10-CM | POA: Diagnosis not present

## 2023-11-05 ENCOUNTER — Ambulatory Visit

## 2023-11-05 VITALS — BP 130/80 | HR 79 | Ht 62.25 in | Wt 157.4 lb

## 2023-11-05 DIAGNOSIS — Z0000 Encounter for general adult medical examination without abnormal findings: Secondary | ICD-10-CM

## 2023-11-05 DIAGNOSIS — Z1211 Encounter for screening for malignant neoplasm of colon: Secondary | ICD-10-CM

## 2023-11-05 DIAGNOSIS — Z780 Asymptomatic menopausal state: Secondary | ICD-10-CM

## 2023-11-05 NOTE — Patient Instructions (Addendum)
 Tiffany Gibbs,  Thank you for taking the time for your Medicare Wellness Visit. I appreciate your continued commitment to your health goals. Please review the care plan we discussed, and feel free to reach out if I can assist you further.  Medicare recommends these wellness visits once per year to help you and your care team stay ahead of potential health issues. These visits are designed to focus on prevention, allowing your provider to concentrate on managing your acute and chronic conditions during your regular appointments.  Please note that Annual Wellness Visits do not include a physical exam. Some assessments may be limited, especially if the visit was conducted virtually. If needed, we may recommend a separate in-person follow-up with your provider.  Ongoing Care Seeing your primary care provider every 3 to 6 months helps us  monitor your health and provide consistent, personalized care. Next office visit on 11/30/2023. You are due for a foot exam and a kidney evaluation, which will be done during your next office visit.    Referrals If a referral was made during today's visit and you haven't received any updates within two weeks, please contact the referred provider directly to check on the status.  [facility] Located in: [facility] MAN[facility] [address] Address: [address], [place], [place] [address] Phone: [phone]  You have an order for:  [x]   Bone Density    Please call for appointment:  [facility] [address] [place], [place] [address] [phone]   Make sure to wear two-piece clothing.  No lotions, powders, or deodorants the day of the appointment. Make sure to bring picture ID and insurance card.  Bring list of medications you are currently taking including any supplements.    Recommended Screenings:  Health Maintenance  Topic Date Due   Complete foot exam   05/12/2019   Colon Cancer  Screening  09/08/2020   Eye exam for diabetics  09/11/2021   Yearly kidney health urinalysis for diabetes  05/21/2023   COVID-19 Vaccine (7 - 2025-26 season) 09/13/2023   Hemoglobin A1C  03/01/2024   Yearly kidney function blood test for diabetes  08/29/2024   Medicare Annual Wellness Visit  11/04/2024   DTaP/Tdap/Td vaccine (4 - Td or Tdap) 08/06/2032   Pneumococcal Vaccine for age over 37  Completed   Flu Shot  Completed   DEXA scan (bone density measurement)  Completed   Hepatitis C Screening  Completed   Zoster (Shingles) Vaccine  Completed   Meningitis B Vaccine  Aged Out   Breast Cancer Screening  Discontinued       11/05/2022    2:15 PM  Advanced Directives  Does Patient Have a Medical Advance Directive? Yes  Type of Estate agent of [place];Living will  Copy of Healthcare Power of Attorney in Chart? No - copy requested   Advance Care Planning is important because it: Ensures you receive medical care that aligns with your values, goals, and preferences. Provides guidance to your family and loved ones, reducing the emotional burden of decision-making during critical moments.  Vision: Annual vision screenings are recommended for early detection of glaucoma, cataracts, and diabetic retinopathy. These exams can also reveal signs of chronic conditions such as diabetes and high blood pressure.  Dental: Annual dental screenings help detect early signs of oral cancer, gum disease, and other conditions linked to overall health, including heart disease and diabetes.  Please see the  attached documents for additional preventive care recommendations.   Managing Pain Without Opioids Opioids are strong medicines used to treat moderate to severe pain. For some people, especially those who have long-term (chronic) pain, opioids may not be the best choice for pain management due to: Side effects like nausea, constipation, and sleepiness. The risk of addiction (opioid  use disorder). The longer you take opioids, the greater your risk of addiction. Pain that lasts for more than 3 months is called chronic pain. Managing chronic pain usually requires more than one approach and is often provided by a team of health care providers working together (multidisciplinary approach). Pain management may be done at a pain management center or pain clinic. How to manage pain without the use of opioids Use non-opioid medicines Non-opioid medicines for pain may include: Over-the-counter or prescription non-steroidal anti-inflammatory drugs (NSAIDs). These may be the first medicines used for pain. They work well for muscle and bone pain, and they reduce swelling. Acetaminophen . This over-the-counter medicine may work well for milder pain but not swelling. Antidepressants. These may be used to treat chronic pain. A certain type of antidepressant (tricyclics) is often used. These medicines are given in lower doses for pain than when used for depression. Anticonvulsants. These are usually used to treat seizures but may also reduce nerve (neuropathic) pain. Muscle relaxants. These relieve pain caused by sudden muscle tightening (spasms). You may also use a pain medicine that is applied to the skin as a patch, cream, or gel (topical analgesic), such as a numbing medicine. These may cause fewer side effects than medicines taken by mouth. Do certain therapies as directed Some therapies can help with pain management. They include: Physical therapy. You will do exercises to gain strength and flexibility. A physical therapist may teach you exercises to move and stretch parts of your body that are weak, stiff, or painful. You can learn these exercises at physical therapy visits and practice them at home. Physical therapy may also involve: Massage. Heat wraps or applying heat or cold to affected areas. Electrical signals that interrupt pain signals (transcutaneous electrical nerve stimulation,  TENS). Weak lasers that reduce pain and swelling (low-level laser therapy). Signals from your body that help you learn to regulate pain (biofeedback). Occupational therapy. This helps you to learn ways to function at home and work with less pain. Recreational therapy. This involves trying new activities or hobbies, such as a physical activity or drawing. Mental health therapy, including: Cognitive behavioral therapy (CBT). This helps you learn coping skills for dealing with pain. Acceptance and commitment therapy (ACT) to change the way you think and react to pain. Relaxation therapies, including muscle relaxation exercises and mindfulness-based stress reduction. Pain management counseling. This may be individual, family, or group counseling.  Receive medical treatments Medical treatments for pain management include: Nerve block injections. These may include a pain blocker and anti-inflammatory medicines. You may have injections: Near the spine to relieve chronic back or neck pain. Into joints to relieve back or joint pain. Into nerve areas that supply a painful area to relieve body pain. Into muscles (trigger point injections) to relieve some painful muscle conditions. A medical device placed near your spine to help block pain signals and relieve nerve pain or chronic back pain (spinal cord stimulation device). Acupuncture. Follow these instructions at home Medicines Take over-the-counter and prescription medicines only as told by your health care provider. If you are taking pain medicine, ask your health care providers about possible side effects to watch  out for. Do not drive or use heavy machinery while taking prescription opioid pain medicine. Lifestyle  Do not use drugs or alcohol to reduce pain. If you drink alcohol, limit how much you have to: 0-1 drink a day for women who are not pregnant. 0-2 drinks a day for men. Know how much alcohol is in a drink. In the [place] one drink  equals one 12 oz bottle of beer (355 mL), one 5 oz glass of wine (148 mL), or one 1 oz glass of hard liquor (44 mL). Do not use any products that contain nicotine or tobacco. These products include cigarettes, chewing tobacco, and vaping devices, such as e-cigarettes. If you need help quitting, ask your health care provider. Eat a healthy diet and maintain a healthy weight. Poor diet and excess weight may make pain worse. Eat foods that are high in fiber. These include fresh fruits and vegetables, whole grains, and beans. Limit foods that are high in fat and processed sugars, such as fried and sweet foods. Exercise regularly. Exercise lowers stress and may help relieve pain. Ask your health care provider what activities and exercises are safe for you. If your health care provider approves, join an exercise class that combines movement and stress reduction. Examples include yoga and tai chi. Get enough sleep. Lack of sleep may make pain worse. Lower stress as much as possible. Practice stress reduction techniques as told by your therapist. General instructions Work with all your pain management providers to find the treatments that work best for you. You are an important member of your pain management team. There are many things you can do to reduce pain on your own. Consider joining an online or in-person support group for people who have chronic pain. Keep all follow-up visits. This is important. Where to find more information You can find more information about managing pain without opioids from: American Academy of Pain Medicine: painmed.org Institute for Chronic Pain: instituteforchronicpain.org American Chronic Pain Association: theacpa.org Contact a health care provider if: You have side effects from pain medicine. Your pain gets worse or does not get better with treatments or home therapy. You are struggling with anxiety or depression. Summary Many types of pain can be managed without  opioids. Chronic pain may respond better to pain management without opioids. Pain is best managed when you and a team of health care providers work together. Pain management without opioids may include non-opioid medicines, medical treatments, physical therapy, mental health therapy, and lifestyle changes. Tell your health care providers if your pain gets worse or is not being managed well enough. This information is not intended to replace advice given to you by your health care provider. Make sure you discuss any questions you have with your health care provider. Document Revised: 04/10/2020 Document Reviewed: 04/10/2020 Elsevier Patient Education  2024 [employer].

## 2023-11-05 NOTE — Progress Notes (Cosign Needed Addendum)
 Subjective:   Tiffany Gibbs is a 78 y.o. who presents for a Medicare Wellness preventive visit.  As a reminder, Annual Wellness Visits don't include a physical exam, and some assessments may be limited, especially if this visit is performed virtually. We may recommend an in-person follow-up visit with your provider if needed.  Visit Complete: In person  Persons Participating in Visit: Patient.  AWV Questionnaire: No: Patient Medicare AWV questionnaire was not completed prior to this visit.  Cardiac Risk Factors include: advanced age (>40men, >97 women);diabetes mellitus;hypertension;Other (see comment), Risk factor comments: OSA     Objective:    Today's Vitals   11/05/23 1549  BP: 130/80  Pulse: 79  SpO2: 100%  Weight: 157 lb 6.4 oz (71.4 kg)  Height: 5' 2.25 (1.581 m)   Body mass index is 28.56 kg/m.     11/05/2023    3:55 PM 11/05/2022    2:15 PM 04/01/2021    1:59 PM 08/14/2020    9:25 AM 08/08/2020   10:56 AM 03/18/2020    1:17 PM 01/06/2020   11:41 AM  Advanced Directives  Does Patient Have a Medical Advance Directive? Yes Yes Yes Yes No;Yes Yes Yes  Type of Estate Agent of [place];Living will Healthcare Power of [place];Living will Living will;Healthcare Power of Attorney Living will;Healthcare Power of [employer] Power of [place];Living will Living will;Healthcare Power of Attorney   Does patient want to make changes to medical advance directive?   No - Patient declined No - Patient declined No - Patient declined No - Patient declined   Copy of Healthcare Power of Attorney in Chart? No - copy requested No - copy requested No - copy requested Yes - validated most recent copy scanned in chart (See row information)  No - copy requested   Would patient like information on creating a medical advance directive?    No - Patient declined No - Patient declined      Current Medications (verified) Outpatient Encounter Medications as  of 11/05/2023  Medication Sig   ALPRAZolam  (XANAX ) 1 MG tablet Take 1 tablet (1 mg total) by mouth 2 (two) times daily as needed. for anxiety   Blood Glucose Monitoring Suppl (ONETOUCH VERIO) w/Device KIT Use to check blood sugar daily. DX E11.9   Cholecalciferol (VITAMIN D3) 50 MCG (2000 UT) CAPS Take 1 capsule (2,000 Units total) by mouth daily.   Cyanocobalamin  (B-12) 5000 MCG CAPS Take 1 tablet by mouth once a week.    diphenhydrAMINE  (BENADRYL ) 25 mg capsule Take 25 mg by mouth daily as needed (allergies).    donepezil  (ARICEPT ) 5 MG tablet Take 1 tablet (5 mg total) by mouth at bedtime.   fluticasone  (FLONASE ) 50 MCG/ACT nasal spray Place 2 sprays into both nostrils daily.   glucose blood (ONETOUCH VERIO) test strip USE 1 STRIP TO CHECK GLUCOSE TWICE DAILY AS NEEDED   Lancets (ONETOUCH ULTRASOFT) lancets Use as instructed   loratadine  (CLARITIN ) 10 MG tablet Take 1 tablet (10 mg total) by mouth daily.   magnesium  oxide (MAG-OX) 400 MG tablet Take 400 mg by mouth daily.   memantine  (NAMENDA ) 5 MG tablet Take 1 tablet by mouth twice daily   metFORMIN  (GLUCOPHAGE ) 500 MG tablet Take 1 tablet (500 mg total) by mouth daily with breakfast.   Omega-3 Fatty Acids (FISH OIL) 1200 MG CAPS Take 1 capsule by mouth in the morning and at bedtime.   oxyCODONE -acetaminophen  (PERCOCET) 10-325 MG tablet Take 1 tablet by mouth every 8 (  eight) hours as needed for pain.   potassium chloride  (KLOR-CON ) 8 MEQ tablet Take 1 tablet by mouth twice daily   Probiotic Product (ALIGN) 4 MG CAPS Take 1 capsule (4 mg total) by mouth daily.   RYBELSUS  7 MG TABS Take 1 tablet by mouth daily.   traZODone  (DESYREL ) 50 MG tablet Take 1-2 tablets (50-100 mg total) by mouth at bedtime.   Vilazodone  HCl (VIIBRYD ) 20 MG TABS Take 1 tablet (20 mg total) by mouth daily.   No facility-administered encounter medications on file as of 11/05/2023.    Allergies (verified) Aricept  [donepezil ], Celecoxib, Diazepam , Enalapril,  Fluoxetine , Gabapentin , Losartan , Lovastatin, Lyrica  [pregabalin ], Naproxen, Pravachol  [pravastatin  sodium], and Zetia  [ezetimibe ]   History: Past Medical History:  Diagnosis Date   Anxiety    Cataract    Depression    DM type 2 (diabetes mellitus, type 2) (HCC)    Fibromyalgia    H/O dizziness    Hives    from tomatoes   HTN (hypertension)    Hyperlipidemia    Internal hemorrhoids    Kidney disease    Low back pain    OSA (obstructive sleep apnea)    uses c-pap   Osteoarthritis    Osteoporosis    SOB (shortness of breath) on exertion    Tubular adenoma of colon    Past Surgical History:  Procedure Laterality Date   cervical cryotherapy     cervix   CYSTOCELE REPAIR N/A 11/01/2018   Procedure: CYSTOSCOPY ANTERIOR REPAIR (CYSTOCELE);  Surgeon: Gaston Hamilton, MD;  Location: [facility];  Service: Urology;  Laterality: N/A;   DRUG INDUCED ENDOSCOPY N/A 08/14/2020   Procedure: DRUG INDUCED ENDOSCOPY;  Surgeon: Mable Lenis, MD;  Location: [facility];  Service: ENT;  Laterality: N/A;   EYE SURGERY     Cataracts/Bil   LASIK Bilateral    TOTAL ABDOMINAL HYSTERECTOMY     TOTAL HIP ARTHROPLASTY  09/2008   right- [place]/   TOTAL HIP ARTHROPLASTY Left 11/28/2012   Procedure: TOTAL HIP ARTHROPLASTY;  Surgeon: Dempsey JINNY Sensor, MD;  Location: [facility];  Service: Orthopedics;  Laterality: Left;   Family History  Problem Relation Age of Onset   Hypertension Mother    Diabetes Mother    Other Mother        DVT   Heart disease Father    Multiple sclerosis Father    Heart attack Father    Kidney disease Brother 81       ESRD   Social History   Socioeconomic History   Marital status: Married    Spouse name: Sam   Number of children: 1   Years of education: 12    Highest education level: Not on file  Occupational History   Occupation: N/A  Tobacco Use   Smoking status: Former    Current packs/day: 0.00    Average packs/day: 2.0 packs/day for 25.0 years (50.0 ttl  pk-yrs)    Types: Cigarettes    Start date: 01/12/1958    Quit date: 01/13/1983    Years since quitting: 40.8   Smokeless tobacco: Never  Vaping Use   Vaping status: Never Used  Substance and Sexual Activity   Alcohol use: No    Alcohol/week: 0.0 standard drinks of alcohol   Drug use: No   Sexual activity: Not Currently    Birth control/protection: Post-menopausal, Surgical  Other Topics Concern   Not on file  Social History Narrative   Was divorced - newly re-married as of  2009, Sam   1 son      Caffeine use- coffee 1 cup daily    Lives with husband/2025   Social Drivers of Health   Financial Resource Strain: Low Risk  (11/05/2023)   Overall Financial Resource Strain (CARDIA)    Difficulty of Paying Living Expenses: Not hard at all  Food Insecurity: No Food Insecurity (11/05/2023)   Hunger Vital Sign    Worried About Running Out of Food in the Last Year: Never true    Ran Out of Food in the Last Year: Never true  Transportation Needs: No Transportation Needs (11/05/2023)   PRAPARE - Administrator, Civil Service (Medical): No    Lack of Transportation (Non-Medical): No  Physical Activity: Inactive (11/05/2023)   Exercise Vital Sign    Days of Exercise per Week: 0 days    Minutes of Exercise per Session: 0 min  Stress: Stress Concern Present (11/05/2023)   [employer] of Occupational Health - Occupational Stress Questionnaire    Feeling of Stress: To some extent  Social Connections: Socially Integrated (11/05/2023)   Social Connection and Isolation Panel    Frequency of Communication with Friends and Family: More than three times a week    Frequency of Social Gatherings with Friends and Family: Not on file    Attends Religious Services: 1 to 4 times per year    Active Member of [employer] or Organizations: Yes    Attends Banker Meetings: 1 to 4 times per year    Marital Status: Married    Tobacco Counseling Counseling given: Not  Answered    Clinical Intake:  Pre-visit preparation completed: Yes  Pain : No/denies pain     BMI - recorded: 28.56 Nutritional Status: BMI 25 -29 Overweight Nutritional Risks: None Diabetes: Yes CBG done?: Yes (112-per pt) CBG resulted in Enter/ Edit results?: No Did pt. bring in CBG monitor from home?: No  Lab Results  Component Value Date   HGBA1C 5.7 08/30/2023   HGBA1C 5.6 05/18/2023   HGBA1C 5.6 02/15/2023     How often do you need to have someone help you when you read instructions, pamphlets, or other written materials from your doctor or pharmacy?: 1 - Never  Interpreter Needed?: No  Information entered by :: Marymargaret Kirker, RMA   Activities of Daily Living     11/05/2023    3:43 PM  In your present state of health, do you have any difficulty performing the following activities:  Hearing? 0  Vision? 0  Difficulty concentrating or making decisions? 0  Walking or climbing stairs? 0  Dressing or bathing? 0  Doing errands, shopping? 0  Preparing Food and eating ? N  Using the Toilet? N  In the past six months, have you accidently leaked urine? N  Do you have problems with loss of bowel control? N  Managing your Medications? N  Managing your Finances? N  Housekeeping or managing your Housekeeping? N    Patient Care Team: [name], Karlynn GAILS, MD as PCP - General Penumalli, Eduard SAUNDERS, MD as Consulting Physician (Neurology) Buck Saucer, MD as Attending Physician (Neurology) Radionchenko, Yulia V, MD as Referring Physician (Ophthalmology) [facility] as Consulting Physician (Ophthalmology) Liam Lerner, MD as Consulting Physician (Orthopedic Surgery)  I have updated your Care Teams any recent Medical Services you may have received from other providers in the past year.     Assessment:   This is a routine wellness examination for Shenita.  Hearing/Vision  screen Hearing Screening - Comments:: Denies hearing difficulties   Vision Screening -  Comments:: Denies vision issues./Dr. Nels appt coming up soon    Goals Addressed   None    Depression Screen     11/05/2023    4:05 PM 07/29/2023    1:42 PM 11/05/2022    2:14 PM 06/25/2022    1:51 PM 03/10/2022    3:23 PM 10/06/2021    1:33 PM 07/02/2021    1:31 PM  PHQ 2/9 Scores  PHQ - 2 Score 1 6 0 0 0 4 0  PHQ- 9 Score 1 15 1 1 3 13  0    Fall Risk     11/05/2023    3:55 PM 09/29/2023    1:42 PM 07/29/2023    1:42 PM 12/17/2022    1:33 PM 11/05/2022    2:19 PM  Fall Risk   Falls in the past year? 0 0 1 0 0  Number falls in past yr: 0 0 0 0 0  Injury with Fall? 0 0 0 0 0  Risk for fall due to :  No Fall Risks History of fall(s) No Fall Risks No Fall Risks  Follow up Falls evaluation completed;Falls prevention discussed Falls evaluation completed Falls evaluation completed Falls evaluation completed Falls prevention discussed    MEDICARE RISK AT HOME:  Medicare Risk at Home Any stairs in or around the home?: Yes (basement/ and front door-does not use-per pt) If so, are there any without handrails?: No Home free of loose throw rugs in walkways, pet beds, electrical cords, etc?: Yes Adequate lighting in your home to reduce risk of falls?: Yes Life alert?: No Use of a cane, walker or w/c?: No Grab bars in the bathroom?: Yes Shower chair or bench in shower?: Yes Elevated toilet seat or a handicapped toilet?: Yes  TIMED UP AND GO:  Was the test performed?  Yes  Length of time to ambulate 10 feet: 15 sec Gait steady and fast without use of assistive device  Cognitive Function: 6CIT completed    10/18/2023    3:05 PM 11/05/2022    2:20 PM 11/19/2014    1:08 PM  MMSE - Mini Mental State Exam  Not completed:   --  Orientation to time 4 5   Orientation to Place 5 5   Registration 3 3   Attention/ Calculation 0 5   Recall 1 3   Language- name 2 objects 2 2   Language- repeat 1 1   Language- follow 3 step command 3 3   Language- read & follow direction 1 1    Write a sentence 1 1   Copy design 0 1   Total score 21 30         11/05/2023    3:58 PM 11/05/2022    2:20 PM  6CIT Screen  What Year? 0 points 0 points  What month? 0 points 0 points  What time? 0 points 0 points  Count back from 20 0 points 0 points  Months in reverse 4 points 0 points  Repeat phrase 10 points 0 points  Total Score 14 points 0 points    Immunizations Immunization History  Administered Date(s) Administered    sv, Bivalent, Protein Subunit Rsvpref,pf (Abrysvo) 08/07/2022   Fluad Quad(high Dose 65+) 09/14/2018, 11/20/2019, 10/06/2021   INFLUENZA, HIGH DOSE SEASONAL PF 09/25/2015, 10/01/2016, 10/13/2017, 09/17/2020, 09/27/2022, 09/29/2023   Influenza Split 11/03/2010, 12/30/2011   Influenza Whole 10/16/2008, 10/08/2009   Influenza,inj,Quad PF,6+ Mos  10/13/2012, 08/30/2013, 10/15/2014   Influenza-Unspecified 11/01/2020   Moderna Covid-19 Fall Seasonal Vaccine 30yrs & older 09/27/2022   Moderna Covid-19 Vaccine Bivalent Booster 27yrs & up 11/01/2020   Moderna SARS-COV2 Booster Vaccination 03/06/2020   Moderna Sars-Covid-2 Vaccination 02/03/2019, 03/05/2019, 09/07/2019   PNEUMOCOCCAL CONJUGATE-20 08/07/2022   Pneumococcal Conjugate-13 08/30/2013   Pneumococcal Polysaccharide-23 11/03/2010, 05/30/2020   RSV,unspecified 08/07/2022   Td 02/25/2011   Tdap 04/02/2021, 08/07/2022   Zoster Recombinant(Shingrix) 06/23/2016, 10/04/2016, 12/19/2019    Screening Tests Health Maintenance  Topic Date Due   FOOT EXAM  05/12/2019   Colonoscopy  09/08/2020   OPHTHALMOLOGY EXAM  09/11/2021   Diabetic kidney evaluation - Urine ACR  05/21/2023   COVID-19 Vaccine (7 - 2025-26 season) 09/13/2023   HEMOGLOBIN A1C  03/01/2024   Diabetic kidney evaluation - eGFR measurement  08/29/2024   Medicare Annual Wellness (AWV)  11/04/2024   DTaP/Tdap/Td (4 - Td or Tdap) 08/06/2032   Pneumococcal Vaccine: 50+ Years  Completed   Influenza Vaccine  Completed   DEXA SCAN  Completed    Hepatitis C Screening  Completed   Zoster Vaccines- Shingrix  Completed   Meningococcal B Vaccine  Aged Out   Mammogram  Discontinued    Health Maintenance Items Addressed: Diabetic Foot Exam recommended, Labs Due UACR, See Nurse Notes at the end of this note  Additional Screening:  Vision Screening: Recommended annual ophthalmology exams for early detection of glaucoma and other disorders of the eye. Is the patient up to date with their annual eye exam?  No  Who is the provider or what is the name of the office in which the patient attends annual eye exams? Dr. Margaretha per pt- has appointment coming up.  Dental Screening: Recommended annual dental exams for proper oral hygiene  Community Resource Referral / Chronic Care Management: CRR required this visit?  No   CCM required this visit?  No   Plan:    I have personally reviewed and noted the following in the patient's chart:   Medical and social history Use of alcohol, tobacco or illicit drugs  Current medications and supplements including opioid prescriptions. Patient is currently taking opioid prescriptions. Information provided to patient regarding non-opioid alternatives. Patient advised to discuss non-opioid treatment plan with their provider. Functional ability and status Nutritional status Physical activity Advanced directives List of other physicians Hospitalizations, surgeries, and ER visits in previous 12 months Vitals Screenings to include cognitive, depression, and falls Referrals and appointments  In addition, I have reviewed and discussed with patient certain preventive protocols, quality metrics, and best practice recommendations. A written personalized care plan for preventive services as well as general preventive health recommendations were provided to patient.   Akua Blethen L Zakeria Kulzer, CMA   11/05/2023   After Visit Summary: (In Person-Printed) AVS printed and given to the patient  Notes: Patient is due  for a foot exam a UACR, which can be done during her next office visit.  She stated that she has an appointment coming up for an diabetic eye exam.   Patient is also due for a colonoscopy and a DEXA, order has been placed today. Patient has some concerns about a red area on her navel.  She noticed it last night.  Denies any pain or itchiness.  Patient would like to discuss during next office visit.    Medical screening examination/treatment/procedure(s) were performed by non-physician practitioner and as supervising physician I was immediately available for consultation/collaboration.  I agree with above. Karlynn Noel, MD

## 2023-11-10 ENCOUNTER — Ambulatory Visit: Payer: Self-pay | Admitting: *Deleted

## 2023-11-10 DIAGNOSIS — F411 Generalized anxiety disorder: Secondary | ICD-10-CM | POA: Diagnosis not present

## 2023-11-10 NOTE — Telephone Encounter (Signed)
 FYI Only or Action Required?: Action required by provider: Patient may need call back from office.  Patient was last seen in primary care on 09/29/2023 by [name], Karlynn GAILS, MD.  Called Nurse Triage reporting Memory Loss.  Symptoms began today.  Interventions attempted: Nothing.  Symptoms are: gradually worsening.  Triage Disposition: Call PCP Within 24 Hours  Patient/caregiver understands and will follow disposition?: Yes   Copied from CRM #[id number]. Topic: Clinical - Red Word Triage >> Nov 10, 2023 12:41 PM Macario HERO wrote: Red Word that prompted transfer to Nurse Triage: Patient called crying said she is losing her memory and needs medication called in right away. Reason for Disposition  New or worsening agitation or behavior problem (e.g., change from patient's normal pattern)  Answer Assessment - Initial Assessment Questions 1. MAIN CONCERN OR SYMPTOM:  What is your main concern right now? What questions do you have? What's the main symptom you're worried about? (e.g., confusion, memory loss)     Patient concerned about remembering simple things- she states she is in charge of everything in the house- all of the sudden things stopped and she can't remember simple things 2. ONSET:  When did the symptom start (or worsen)? (minutes, hours, days, weeks)     Off/on- worse today 3. BETTER-SAME-WORSE: Are you (the patient) getting better, staying the same, or getting worse compared to the day you (they) were diagnosed or most recent hospital discharge?     Getting worse 4. DIAGNOSIS: Was the dementia diagnosed by a doctor? If Yes, ask: When? (e.g., days, months, years ago)     Patient reports no memory problems in past 5. MEDICINES: Has there been any change in medicines recently? (e.g., narcotics, antihistamines, benzodiazepines, etc.)     same 6. OTHER SYMPTOMS: Are there any other symptoms? (e.g., cough, falling, fever, pain)     none 7. SUPPORT: What type of  support do you (the patient) have? Note: Document living circumstances and support (e.g., family, nursing home).     Main care giver- patient is overwhelmed today- she states everything is a fog  and she is having mood swings. Patient is requesting provider give her a pill for her memory- she states she was angry at her husband for not getting the mail- she went to the mailbox in the ran and she came back in and couldn't remember what she had been doing. Patient is crying and she is arguing with her husband- I advised her I could schedule an appointment for her-but she just wants PCP to send medication. She states she would be better off not being here- while she is arguing with her husband. Patient refuses 911/ED- she states she does not want to be rude and hang up on me- but she needs to disconnect the phone- she is declining appointment offered.  Protocols used: Dementia Symptoms and Questions-A-AH

## 2023-11-11 ENCOUNTER — Ambulatory Visit: Payer: Self-pay

## 2023-11-11 DIAGNOSIS — F411 Generalized anxiety disorder: Secondary | ICD-10-CM | POA: Diagnosis not present

## 2023-11-11 NOTE — Telephone Encounter (Signed)
 FYI Only or Action Required?: Action required by provider: update on patient condition.  Patient was last seen in primary care on 09/29/2023 by [name], Karlynn GAILS, MD.  Called Nurse Triage reporting Dementia.  Symptoms began Ongoing .  Interventions attempted: Prescription medications: Namenda  5 mg  BID.  Symptoms are: gradually worsening.  Triage Disposition: Call PCP Now  Patient/caregiver understands and will follow disposition?: Yes  **See note Below**                     Copied from CRM #[id number]. Topic: Clinical - Red Word Triage >> Nov 11, 2023  2:26 PM Sasha M wrote: Red Word that prompted transfer to Nurse Triage: dementia getting worse and pt husband called in to report because he states that he can't talk when she's home so he's calling now while she's out Reason for Disposition  [1] Caller requests to speak ONLY to PCP AND [2] URGENT question  Answer Assessment - Initial Assessment Questions 1. REASON FOR CALL or QUESTION: What is your reason for calling today? or How can I best   Patients husband is wanting to speak directly with Dr. Garald, related to the patients mental decline and dementia. He stated patient is not at home now, but wants to discuss this while she's away as she becomes angry. He reports that he called 911 on the patient yesterday as she was throwing books at him. Per previous encounter, husband stated  she is taking the Namenda  5mg  BID. He is seeking a callback as soon as possible; before end of day.    Sam Hussy [phone] (mobile)  Protocols used: PCP Call - No Triage-A-AH

## 2023-11-11 NOTE — Telephone Encounter (Addendum)
 Noted.  Tiffany Gibbs should be taking Namenda  (memantine ) 5 mg twice a day for her memory. She should call her psychologist to talk.  Agree with office visit Thanks

## 2023-11-11 NOTE — Telephone Encounter (Signed)
 Please inform pt of the following upon her return call to the clinic Noted.  Tiffany Gibbs should be taking Namenda  (memantine ) 5 mg twice a day for her memory. She should call her psychologist to talk.  Agree with office visit.. Thanks

## 2023-11-15 NOTE — Telephone Encounter (Signed)
 I am sorry, I did not see this message on time. I would suggest a psychiatry consultation.  Would Kalayna like me to refer her to see a psychiatrist? Thanks

## 2023-11-17 DIAGNOSIS — F411 Generalized anxiety disorder: Secondary | ICD-10-CM | POA: Diagnosis not present

## 2023-11-20 ENCOUNTER — Other Ambulatory Visit: Payer: Self-pay | Admitting: Internal Medicine

## 2023-11-21 ENCOUNTER — Ambulatory Visit
Admission: RE | Admit: 2023-11-21 | Discharge: 2023-11-21 | Disposition: A | Discharge status: Home / Self Care | Source: Ambulatory Visit | Attending: Diagnostic Neuroimaging | Admitting: Diagnostic Neuroimaging

## 2023-11-21 DIAGNOSIS — R413 Other amnesia: Secondary | ICD-10-CM | POA: Diagnosis not present

## 2023-11-21 MED ORDER — GADOPICLENOL 0.5 MMOL/ML IV SOLN
7.0000 mL | Freq: Once | INTRAVENOUS | Status: AC | PRN
  Administered 2023-11-21: 7 mL via INTRAVENOUS

## 2023-11-22 ENCOUNTER — Other Ambulatory Visit: Payer: Self-pay | Admitting: Internal Medicine

## 2023-11-22 NOTE — Telephone Encounter (Signed)
 Copied from CRM [id number]. Topic: Clinical - Medication Refill >> Nov 22, 2023  9:45 AM Revonda D wrote: Medication: oxyCODONE -acetaminophen  (PERCOCET) 10-325 MG tablet  Has the patient contacted their pharmacy? Yes (Agent: If no, request that the patient contact the pharmacy for the refill. If patient does not wish to contact the pharmacy document the reason why and proceed with request.) (Agent: If yes, when and what did the pharmacy advise?)  This is the patient's preferred pharmacy:  [facility] [address] [place] - [address] [place] [address] Phone: [phone] Fax: [phone]  Is this the correct pharmacy for this prescription? Yes If no, delete pharmacy and type the correct one.   Has the prescription been filled recently? No  Is the patient out of the medication? No, 2 left  Has the patient been seen for an appointment in the last year OR does the patient have an upcoming appointment? Yes  Can we respond through MyChart? No  Agent: Please be advised that Rx refills may take up to 3 business days. We ask that you follow-up with your pharmacy.

## 2023-11-23 DIAGNOSIS — F411 Generalized anxiety disorder: Secondary | ICD-10-CM | POA: Diagnosis not present

## 2023-11-23 DIAGNOSIS — Z0100 Encounter for examination of eyes and vision without abnormal findings: Secondary | ICD-10-CM | POA: Diagnosis not present

## 2023-11-24 ENCOUNTER — Ambulatory Visit: Payer: Self-pay

## 2023-11-24 NOTE — Telephone Encounter (Signed)
 Attempted to reach pts husband in regards to the message frm the triage nurse... Husband did not answer. Unable to LVM.
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
## 2023-11-25 NOTE — Telephone Encounter (Signed)
 2nd attempt to reach pts husband about concerns he has for his wife.  ** Please ask pts husband in detail what is his concerns about the pt and if she is a danger to her self or anyone else... If so please inform him that pt is needing to be seen in the ED ASAP!

## 2023-11-25 NOTE — Telephone Encounter (Unsigned)
 Copied from CRM [id number]. Topic: General - Call Back - No Documentation >> Nov 24, 2023  5:30 PM Alfonso ORN wrote: Reason for CRM: pt husband called back to return call regarding NT call. Please contact pt back

## 2023-11-30 ENCOUNTER — Ambulatory Visit (INDEPENDENT_AMBULATORY_CARE_PROVIDER_SITE_OTHER): Admitting: Internal Medicine

## 2023-11-30 ENCOUNTER — Encounter: Payer: Self-pay | Admitting: Internal Medicine

## 2023-11-30 VITALS — BP 150/88 | HR 83 | Temp 98.2°F | Ht 62.25 in | Wt 159.8 lb

## 2023-11-30 DIAGNOSIS — E538 Deficiency of other specified B group vitamins: Secondary | ICD-10-CM | POA: Diagnosis not present

## 2023-11-30 DIAGNOSIS — Z7984 Long term (current) use of oral hypoglycemic drugs: Secondary | ICD-10-CM

## 2023-11-30 DIAGNOSIS — M5440 Lumbago with sciatica, unspecified side: Secondary | ICD-10-CM | POA: Diagnosis not present

## 2023-11-30 DIAGNOSIS — F4321 Adjustment disorder with depressed mood: Secondary | ICD-10-CM | POA: Diagnosis not present

## 2023-11-30 DIAGNOSIS — E119 Type 2 diabetes mellitus without complications: Secondary | ICD-10-CM

## 2023-11-30 DIAGNOSIS — I10 Essential (primary) hypertension: Secondary | ICD-10-CM | POA: Diagnosis not present

## 2023-11-30 DIAGNOSIS — L0390 Cellulitis, unspecified: Secondary | ICD-10-CM

## 2023-11-30 MED ORDER — OLANZAPINE 5 MG PO TABS: 5.0000 mg | ORAL_TABLET | Freq: Every evening | ORAL | 5 refills | Status: AC

## 2023-11-30 MED ORDER — OXYCODONE-ACETAMINOPHEN 10-325 MG PO TABS: 1.0000 | ORAL_TABLET | Freq: Three times a day (TID) | ORAL | 0 refills | Status: DC | PRN

## 2023-11-30 MED ORDER — CLOTRIMAZOLE-BETAMETHASONE 1-0.05 % EX CREA
1.0000 | TOPICAL_CREAM | Freq: Every day | CUTANEOUS | 0 refills | Status: AC
Start: 2023-11-30 — End: ?

## 2023-11-30 NOTE — Progress Notes (Signed)
 Subjective:  Patient ID: Tiffany Gibbs, female    DOB: [birth year]  Age: 78 y.o. MRN: [id number]  CC: Medical Management of Chronic Issues (2 Month follow up, insomnia, fatigue, sleeping through the day)   HPI Tiffany Gibbs presents for LBP w/occasional severe pain (used Oxycod 20 since August). It helps well. F/u on insomnia, agitation - worse. Not sleeping x 3-4 nights per week Taking Viibryd , Trazodone  - not helping   Outpatient Medications Prior to Visit  Medication Sig Dispense Refill   ALPRAZolam  (XANAX ) 1 MG tablet Take 1 tablet (1 mg total) by mouth 2 (two) times daily as needed. for anxiety 60 tablet 0   Blood Glucose Monitoring Suppl (ONETOUCH VERIO) w/Device KIT Use to check blood sugar daily. DX E11.9 1 kit 0   Cholecalciferol (VITAMIN D3) 50 MCG (2000 UT) CAPS Take 1 capsule (2,000 Units total) by mouth daily. 100 capsule 3   Cyanocobalamin  (B-12) 5000 MCG CAPS Take 1 tablet by mouth once a week.      diphenhydrAMINE  (BENADRYL ) 25 mg capsule Take 25 mg by mouth daily as needed (allergies).      donepezil  (ARICEPT ) 5 MG tablet Take 1 tablet (5 mg total) by mouth at bedtime. 30 tablet 5   fluticasone  (FLONASE ) 50 MCG/ACT nasal spray Place 2 sprays into both nostrils daily. 16 g 6   glucose blood (ONETOUCH VERIO) test strip USE 1 STRIP TO CHECK GLUCOSE TWICE DAILY AS NEEDED 200 each 3   Lancets (ONETOUCH ULTRASOFT) lancets Use as instructed 100 each 12   loratadine  (CLARITIN ) 10 MG tablet Take 1 tablet (10 mg total) by mouth daily. 30 tablet 11   magnesium  oxide (MAG-OX) 400 MG tablet Take 400 mg by mouth daily.     memantine  (NAMENDA ) 5 MG tablet Take 1 tablet by mouth twice daily 180 tablet 2   metFORMIN  (GLUCOPHAGE ) 500 MG tablet Take 1 tablet (500 mg total) by mouth daily with breakfast.     Omega-3 Fatty Acids (FISH OIL) 1200 MG CAPS Take 1 capsule by mouth in the morning and at bedtime.     potassium chloride  (KLOR-CON ) 8 MEQ tablet Take 1 tablet by  mouth twice daily 180 tablet 1   Probiotic Product (ALIGN) 4 MG CAPS Take 1 capsule (4 mg total) by mouth daily. 30 capsule 0   RYBELSUS  7 MG TABS Take 1 tablet by mouth daily.     Vilazodone  HCl (VIIBRYD ) 20 MG TABS Take 1 tablet (20 mg total) by mouth daily. 30 tablet 5   oxyCODONE -acetaminophen  (PERCOCET) 10-325 MG tablet Take 1 tablet by mouth every 8 (eight) hours as needed for pain.     traZODone  (DESYREL ) 50 MG tablet Take 1-2 tablets (50-100 mg total) by mouth at bedtime. 60 tablet 5   No facility-administered medications prior to visit.    ROS: Review of Systems  Constitutional:  Positive for fatigue. Negative for activity change, appetite change, chills and unexpected weight change.  HENT:  Negative for congestion, mouth sores and sinus pressure.   Eyes:  Negative for visual disturbance.  Respiratory:  Negative for cough and chest tightness.   Gastrointestinal:  Negative for abdominal pain and nausea.  Genitourinary:  Negative for difficulty urinating, frequency and vaginal pain.  Musculoskeletal:  Positive for arthralgias, back pain and gait problem.  Skin:  Negative for pallor and rash.  Neurological:  Negative for dizziness, tremors, weakness, numbness and headaches.  Psychiatric/Behavioral:  Positive for decreased concentration, dysphoric mood and sleep disturbance. Negative for  confusion, self-injury and suicidal ideas. The patient is nervous/anxious.     Objective:  BP (!) 150/88   Pulse 83   Temp 98.2 F (36.8 C)   Ht 5' 2.25 (1.581 m)   Wt 159 lb 12.8 oz (72.5 kg)   SpO2 98%   BMI 28.99 kg/m   BP Readings from Last 3 Encounters:  11/30/23 (!) 150/88  11/05/23 130/80  10/18/23 126/70    Wt Readings from Last 3 Encounters:  11/30/23 159 lb 12.8 oz (72.5 kg)  11/05/23 157 lb 6.4 oz (71.4 kg)  10/18/23 162 lb 9.6 oz (73.8 kg)    Physical Exam Constitutional:      General: She is not in acute distress.    Appearance: She is well-developed.  HENT:      Head: Normocephalic.     Right Ear: External ear normal.     Left Ear: External ear normal.     Nose: Nose normal.  Eyes:     General:        Right eye: No discharge.        Left eye: No discharge.     Conjunctiva/sclera: Conjunctivae normal.     Pupils: Pupils are equal, round, and reactive to light.  Neck:     Thyroid : No thyromegaly.     Vascular: No JVD.     Trachea: No tracheal deviation.  Cardiovascular:     Rate and Rhythm: Normal rate and regular rhythm.     Heart sounds: Normal heart sounds.  Pulmonary:     Effort: No respiratory distress.     Breath sounds: No stridor. No wheezing.  Abdominal:     General: Bowel sounds are normal. There is no distension.     Palpations: Abdomen is soft. There is no mass.     Tenderness: There is no abdominal tenderness. There is no guarding or rebound.  Musculoskeletal:        General: Tenderness present.     Cervical back: Normal range of motion and neck supple. No rigidity.     Right lower leg: No edema.     Left lower leg: No edema.  Lymphadenopathy:     Cervical: No cervical adenopathy.  Skin:    Findings: No erythema or rash.  Neurological:     Mental Status: Mental status is at baseline.     Cranial Nerves: No cranial nerve deficit.     Motor: No abnormal muscle tone.     Coordination: Coordination normal.     Deep Tendon Reflexes: Reflexes normal.  Psychiatric:        Behavior: Behavior normal.        Thought Content: Thought content normal.        Judgment: Judgment normal.   She AP years mildly emotionally upset and concerned.  Alert, oriented and cooperative.  No suicidal or homicidal Lumbar spine is tender with range of motion  Lab Results  Component Value Date   WBC 6.4 02/15/2023   HGB 14.1 02/15/2023   HCT 42.4 02/15/2023   PLT 246.0 02/15/2023   GLUCOSE 86 08/30/2023   CHOL 209 (H) 03/10/2022   TRIG 71.0 03/10/2022   HDL 74.10 03/10/2022   LDLDIRECT 148.2 02/03/2013   LDLCALC 120 (H) 03/10/2022   ALT  11 08/30/2023   AST 15 08/30/2023   NA 141 08/30/2023   K 4.0 08/30/2023   CL 104 08/30/2023   CREATININE 1.13 08/30/2023   BUN 12 08/30/2023   CO2 28 08/30/2023  TSH 1.11 08/30/2023   INR 1.0 10/28/2018   HGBA1C 5.7 08/30/2023    MR BRAIN W WO CONTRAST Result Date: 11/22/2023 Table formatting from the original result was not included. [facility] NEUROIMAGING REPORT STUDY DATE: 11/21/23 PATIENT NAME: Marketia Stallsmith DOB: [birth year] MRN: [id number] ORDERING CLINICIAN: Margaret Eduard SAUNDERS, MD CLINICAL HISTORY: 78 y.o. year old female with: 1. Memory loss   EXAM: MR BRAIN W WO CONTRAST TECHNIQUE: MRI of the brain with and without contrast was obtained utilizing multiplanar, multiecho pulse sequences. CONTRAST: Diagnostic Product Medications (last 72 hours)   Date/Time Action Medication Dose  11/21/23 1227 Contrast Given  gadopiclenol  (VUEWAY ) 0.5 MMOL/ML solution 7 mL 7 mL   COMPARISON: 05/13/15 MRI IMAGING SITE: [facility] AT [address] [place] [address] FINDINGS: No abnormal lesions are seen on diffusion-weighted views to suggest acute ischemia. The cortical sulci, fissures and cisterns are notable for mild generalized atrophy. Lateral, third and fourth ventricle are normal in size and appearance. No extra-axial fluid collections are seen. No evidence of mass effect or midline shift. Mild periventricular and subcortical foci of non-specific gliosis. No abnormal lesions are seen on post contrast views.  On sagittal views the posterior fossa, pituitary gland and corpus callosum are unremarkable. No evidence of intracranial hemorrhage on SWI views. The orbits and their contents, paranasal sinuses and calvarium are notable for bilateral lens extractions.  Intracranial flow voids are present.   MRI brain (with and without) demonstrating: - Mild progression of atrophy and non-specific gliosis since 2017. - No acute  findings. INTERPRETING PHYSICIAN: EDUARD FABIENE MARGARET, MD Certified in Neurology, Neurophysiology and Neuroimaging [facility] [address] [place], [place] [address] [phone]   Assessment & Plan:   Problem List Items Addressed This Visit     B12 deficiency   On B12      Cellulitis   Likely fungal infection.  Umbillicus - Lotrisone  cream Rx       DM type 2 (diabetes mellitus, type 2) (HCC) - Primary   Chronic  Statin intolerant Cont on  Metformin , Rybelsus       Hypertension, essential   Furosemide  prn      LOW BACK PAIN   Chronic severe intermittent back pain.  Worse.  Percocet prescribed Percocet 10/325 prn  Potential benefits of a short/long term opioids use as well as potential risks (i.e. addiction risk, apnea etc) and complications (i.e. Somnolence, constipation and others) were explained to the patient and were aknowledged. Robaxin  prn      Relevant Medications   oxyCODONE -acetaminophen  (PERCOCET) 10-325 MG tablet   Situational depression   Chronic refractory symptoms.  Continue to see a psychologist.  She states her symptoms drive are driving her crazy.  She is unable to sleep or relax.  Will add olanzapine .  Risks and benefits of using olanzapine  were discussed         Meds ordered this encounter  Medications   OLANZapine  (ZYPREXA ) 5 MG tablet    Sig: Take 1 tablet (5 mg total) by mouth every evening.    Dispense:  30 tablet    Refill:  5   oxyCODONE -acetaminophen  (PERCOCET) 10-325 MG tablet    Sig: Take 1 tablet by mouth every 8 (eight) hours as needed for pain.    Dispense:  30 tablet    Refill:  0    Max dose up to 3 tablets a day   clotrimazole -betamethasone  (LOTRISONE ) cream  Sig: Apply 1 Application topically daily. On rash    Dispense:  30 g    Refill:  0      Follow-up: Return in about 2 weeks (around 12/14/2023) for a follow-up visit.  Marolyn Noel, MD

## 2023-11-30 NOTE — Patient Instructions (Addendum)
 Stop Trazodone . Continue Viibrid Start Olanzapine - one with dinner

## 2023-11-30 NOTE — Assessment & Plan Note (Addendum)
 Likely fungal infection.  Umbillicus - Lotrisone  cream Rx

## 2023-11-30 NOTE — Assessment & Plan Note (Signed)
Furosemide prn 

## 2023-11-30 NOTE — Assessment & Plan Note (Signed)
 On B12

## 2023-11-30 NOTE — Assessment & Plan Note (Signed)
 Chronic  Statin intolerant Cont on  Metformin , Rybelsus 

## 2023-12-05 NOTE — Assessment & Plan Note (Signed)
 Chronic severe intermittent back pain.  Worse.  Percocet prescribed Percocet 10/325 prn  Potential benefits of a short/long term opioids use as well as potential risks (i.e. addiction risk, apnea etc) and complications (i.e. Somnolence, constipation and others) were explained to the patient and were aknowledged. Robaxin  prn

## 2023-12-05 NOTE — Assessment & Plan Note (Addendum)
 Chronic refractory symptoms.  Continue to see a psychologist.  She states her symptoms drive are driving her crazy.  She is unable to sleep or relax.  Will add olanzapine .  Risks and benefits of using olanzapine  were discussed

## 2023-12-08 ENCOUNTER — Ambulatory Visit (INDEPENDENT_AMBULATORY_CARE_PROVIDER_SITE_OTHER)
Admission: RE | Admit: 2023-12-08 | Discharge: 2023-12-08 | Disposition: A | Discharge status: Home / Self Care | Source: Ambulatory Visit | Attending: Internal Medicine | Admitting: Internal Medicine

## 2023-12-08 DIAGNOSIS — Z780 Asymptomatic menopausal state: Secondary | ICD-10-CM | POA: Diagnosis not present

## 2023-12-13 DIAGNOSIS — F411 Generalized anxiety disorder: Secondary | ICD-10-CM | POA: Diagnosis not present

## 2023-12-16 ENCOUNTER — Ambulatory Visit: Admitting: Internal Medicine

## 2023-12-17 ENCOUNTER — Other Ambulatory Visit: Payer: Self-pay | Admitting: Internal Medicine

## 2023-12-21 DIAGNOSIS — R3129 Other microscopic hematuria: Secondary | ICD-10-CM | POA: Diagnosis not present

## 2023-12-21 DIAGNOSIS — N281 Cyst of kidney, acquired: Secondary | ICD-10-CM | POA: Diagnosis not present

## 2023-12-21 DIAGNOSIS — G4733 Obstructive sleep apnea (adult) (pediatric): Secondary | ICD-10-CM | POA: Diagnosis not present

## 2023-12-21 DIAGNOSIS — N182 Chronic kidney disease, stage 2 (mild): Secondary | ICD-10-CM | POA: Diagnosis not present

## 2023-12-21 DIAGNOSIS — E1122 Type 2 diabetes mellitus with diabetic chronic kidney disease: Secondary | ICD-10-CM | POA: Diagnosis not present

## 2023-12-21 DIAGNOSIS — I129 Hypertensive chronic kidney disease with stage 1 through stage 4 chronic kidney disease, or unspecified chronic kidney disease: Secondary | ICD-10-CM | POA: Diagnosis not present

## 2023-12-27 ENCOUNTER — Ambulatory Visit: Payer: Self-pay

## 2023-12-27 ENCOUNTER — Other Ambulatory Visit: Payer: Self-pay | Admitting: Internal Medicine

## 2023-12-27 DIAGNOSIS — Z1231 Encounter for screening mammogram for malignant neoplasm of breast: Secondary | ICD-10-CM | POA: Diagnosis not present

## 2023-12-27 LAB — HM MAMMOGRAPHY

## 2023-12-27 NOTE — Telephone Encounter (Signed)
 This RN made x2 attempts to contact pt with no answer. A voicemail was left with call back number provided.    Copied from CRM #[id number]. Topic: Clinical - Red Word Triage >> Dec 27, 2023  3:53 PM Rea ORN wrote: Red Word that prompted transfer to Nurse Triage: severe back pain, pt out of refills for oxyCODONE -acetaminophen 

## 2023-12-28 ENCOUNTER — Telehealth: Payer: Self-pay

## 2023-12-28 DIAGNOSIS — F411 Generalized anxiety disorder: Secondary | ICD-10-CM | POA: Diagnosis not present

## 2023-12-28 NOTE — Telephone Encounter (Signed)
 FYI:  This RN made x2 attempts to contact pt with no answer. A voicemail was left with call back number provided.

## 2023-12-28 NOTE — Telephone Encounter (Signed)
 Copied from CRM #[id number]. Topic: Clinical - Medication Question >> Dec 28, 2023 12:58 PM Pinkey ORN wrote: Reason for CRM: Medication >> Dec 28, 2023  1:01 PM Pinkey ORN wrote: Patient states she's having to double up on her memory medication and is wanting to know if Dr. Garald could call her in a stronger prescription. Patient is urgently requesting an office call back, please follow up with patient at [phone]

## 2023-12-29 ENCOUNTER — Ambulatory Visit: Payer: Self-pay

## 2023-12-29 ENCOUNTER — Other Ambulatory Visit: Payer: Self-pay | Admitting: Internal Medicine

## 2023-12-29 NOTE — Telephone Encounter (Signed)
 Attempted to reach pt to inform her of covering providers advice as follows Do not double prescription without talking to PCP. Can we get her scheduled for an appt, even if its virtual?  Please have pt make an appointment ASAP for her memory concerns.

## 2023-12-29 NOTE — Telephone Encounter (Signed)
 FYI Only or Action Required?: Action required by provider: medication refill request. oxyCODONE -acetaminophen  (PERCOCET) 10-325 MG tablet, has been out for over a week.  Patient was last seen in primary care on 11/30/2023 by [name], Karlynn GAILS, MD.  Called Nurse Triage reporting Back Pain and Medication Refill.  Symptoms began Chronic, several years.  Interventions attempted: Prescription medications: Percocet.  Symptoms are: gradually worsening.  Triage Disposition: Call PCP When Office is Open  Patient/caregiver understands and will follow disposition?: Yes   Pt calling to get percocet refilled. She has been taking this for years for chronic back pain. Has not taken it for over a week. 9/10 back pain currently. Has not gotten worse. Reports she called on 12/15 to request refill. Several calls backs by nurse triage, LVM for pt to call back. Pt calling back today to see if it can be prescribed. Sending HP rx refill request to office.    Copied from CRM [id number]. Topic: Clinical - Red Word Triage >> Dec 29, 2023  5:04 PM Lauren C wrote: Red Word that prompted transfer to Nurse Triage: Pt is out of refills for oxycodone , severe back pain. Called 2 days ago and missed calls back from nurse triage. Reason for Disposition  Caller requesting a CONTROLLED substance prescription refill (e.g., narcotics, ADHD medicines)  Answer Assessment - Initial Assessment Questions 1. DRUG NAME: What medicine do you need to have refilled?     oxyCODONE -acetaminophen  (PERCOCET) 10-325 MG tablet  2. REFILLS REMAINING: How many refills are remaining? Notes: The label on the medicine or pill bottle will show how many refills are remaining. If there are no refills remaining, then a renewal may be needed.     0  3. EXPIRATION DATE: What is the expiration date? Note: The label states when the prescription will expire, and thus can no longer be refilled.)     *No Answer*  4. PRESCRIBER: Who prescribed  it? Note: The prescribing doctor or group is responsible for refill approvals.SABRA Noel, Karlynn GAILS, MD  5. PHARMACY: Have you contacted your pharmacy (drugstore)? Note: Some pharmacies will contact the doctor (or NP/PA).      Yes, contacted [facility] - [place], [place] - [address].   6. SYMPTOMS: Do you have any symptoms?     Back pain from shoulders to legs, chronic, ongoing for years  Protocols used: Medication Refill and Renewal Call-A-AH

## 2023-12-30 ENCOUNTER — Other Ambulatory Visit: Payer: Self-pay | Admitting: Internal Medicine

## 2023-12-30 ENCOUNTER — Encounter: Payer: Self-pay | Admitting: Internal Medicine

## 2023-12-30 MED ORDER — OXYCODONE-ACETAMINOPHEN 10-325 MG PO TABS: 1.0000 | ORAL_TABLET | Freq: Three times a day (TID) | ORAL | 0 refills | Status: AC | PRN

## 2023-12-30 MED ORDER — OXYCODONE-ACETAMINOPHEN 10-325 MG PO TABS: 1.0000 | ORAL_TABLET | Freq: Three times a day (TID) | ORAL | Status: DC | PRN

## 2023-12-30 NOTE — Telephone Encounter (Signed)
 Done. Thanks.

## 2024-01-04 ENCOUNTER — Ambulatory Visit: Payer: Self-pay | Admitting: *Deleted

## 2024-01-04 NOTE — Telephone Encounter (Signed)
 FYI Only or Action Required?: Action required by provider: clinical question for provider and appt scheduled for 01/07/24.SABRA  Patient was last seen in primary care on 11/30/2023 by [name], Karlynn GAILS, MD.  Called Nurse Triage reporting Insomnia.  Symptoms began several weeks ago. Worsening last 8 days   Interventions attempted: Prescription medications: trazodone  .  Symptoms are: gradually worsening.  Triage Disposition: See PCP When Office is Open (Within 3 Days)  Patient/caregiver understands and will follow disposition?: Yes   See NT encounter. Please advise if any OTC recommendations for patient to take prior to OV 01/07/24.            Copied from CRM #[id number]. Topic: Clinical - Red Word Triage >> Jan 04, 2024 12:03 PM Thersia BROCKS wrote: Red Word that prompted transfer to Nurse Triage: Patient hasnt slept in about 8 days, stated her medication isnt working anymore. States she either needs a new medicationor higher dose, feels bad and sleepy. Feels like if she keeps going without any sleep she is going to collapse Reason for Disposition  MODERATE - SEVERE insomnia (e.g., awake most of night; lack of sleep interferes with ability to function during the day; interferes with work or school)  Answer Assessment - Initial Assessment Questions Appt scheduled 01/07/24. Patient requesting if PCP has recommendations for OTC medications for sleep she can take prior to OV. Patient is currently taking trazodone  2 tablets at night with no relief. Trazodone  not on current med list.      1. DESCRIPTION: Tell me about your sleeping problem. (e.g., waking frequently during night, sleeping during day and awake at night, trouble falling asleep) How bad is it?      Not able to sleep for approx 8 days only sleeping 4 hours . Trouble falling asleep. Toss and turn several hours. Feels like zombie during the day  2. ONSET: How long have you been having trouble sleeping? (e.g., days,  weeks, months; longstanding sleep problems)     Few months and worse last 8 days  3. DAYTIME SLEEP PATTERN: How much time do you spend sleeping or napping during the day?     Does not nap during the day  4. STRESSORS: Is there anything that is making you feel stressed? Is there something that worries you?     No  5. PAIN: Do you have any pain that is keeping you awake? (e.g., back pain, joint pain) If Yes, ask How bad is the pain? (e.g., scale 0-10; mild, moderate, severe).     no 6. CAFFEINE: Do you drink caffeinated beverages? If Yes, ask How much each day? (e.g., coffee, tea, colas)     No  7. ALCOHOL USE OR SUBSTANCE USE: Do you drink alcohol or use any substances?     No  8. MEDICINE CHANGE: Has there been any recent change in medicines? (e.g., new medicine started, stopped, or dose changed).      Trazodone  2 tablets at night  9. TREATMENT: What have you done so far to treat this sleep problem? (e.g., prescription or OTC sleep medicines, herbal or dietary supplements, cannabis, relaxation strategies)     medications 10. OTHER SYMPTOMS: Do you have any other symptoms?  (e.g., difficulty breathing)       Exhausted, not sleeping at night . No chest pain , no difficulty breathing , no fever.  11. PREGNANCY: Is there any chance you are pregnant? When was your last menstrual period?       na  Protocols used:  Insomnia-A-AH

## 2024-01-07 ENCOUNTER — Telehealth: Payer: Self-pay

## 2024-01-07 ENCOUNTER — Ambulatory Visit: Admitting: Internal Medicine

## 2024-01-07 NOTE — Telephone Encounter (Signed)
 Copied from CRM #[id number]. Topic: General - Other >> Jan 07, 2024 10:58 AM Mercedes MATSU wrote: Reason for CRM: Patient called in stating she will not be coming in to her appt for today. Patients message from the nurse was also relayed no call back is needed. Patient will f/u per scheduled appt.

## 2024-01-07 NOTE — Telephone Encounter (Signed)
 Attempted to reach pt and inform of the following advice per provider as follows She also has Xanax  prescribed. Can try Xanax  at bedtime to help with rest. Follow-up with PCP as scheduled

## 2024-01-10 ENCOUNTER — Telehealth: Payer: Self-pay

## 2024-01-10 NOTE — Telephone Encounter (Signed)
 Copied from CRM [id number]. Topic: Clinical - Medication Question >> Jan 10, 2024  1:01 PM Shereese L wrote:  Reason for CRM: Patient stated that the 3 meds she's taking to go to sleep is not helping and she hasn't been able to sleep. Patient requesting a stronger sleep medication.SABRA CB# [phone]

## 2024-01-10 NOTE — Telephone Encounter (Signed)
 Pt is needing to have a OV with her PCP as he is needing to evaluate and discuss medications with the pt.  Called pt to inform her of the above no answer at this time.

## 2024-01-11 ENCOUNTER — Telehealth: Payer: Self-pay

## 2024-01-11 NOTE — Telephone Encounter (Signed)
 Copied from CRM [id number]. Topic: Clinical - Medication Question >> Jan 11, 2024  2:49 PM Tiffany Gibbs wrote: Reason for CRM: Pt calling to check on status of new sleeping medication. Relayed message and offered an OV to discuss. Pt declined - unwilling to drive and requesting callback instead.   Callback [phone]

## 2024-01-14 ENCOUNTER — Encounter: Payer: Self-pay | Admitting: *Deleted

## 2024-01-14 NOTE — Telephone Encounter (Signed)
 Tried to reach the pt and inform her that she is needing an OV with PCP and to bring in ALL medications so PCP can go over meds with pt and adjust those he sees fit to be adjusted.  **Please make an apptmnt for the pt to be seen ASAP.  PCP has stated I am sorry about the insomnia.  Please ask the patient to come for an office visit.  Asked the patient to bring all her medicines with her.  Thank you

## 2024-01-14 NOTE — Telephone Encounter (Signed)
 I am sorry about the insomnia.  Please ask the patient to come for an office visit.  Asked the patient to bring all her medicines with her.  Thank you

## 2024-01-14 NOTE — Progress Notes (Signed)
 Tiffany Gibbs                                          MRN: [id number]   01/14/2024   The [facility] Quality Team Specialist reviewed this patient medical record for the purposes of chart review for care gap closure. The following were reviewed: chart review for care gap closure-kidney health evaluation for diabetes:eGFR  and uACR.    [facility] Quality Team

## 2024-01-17 ENCOUNTER — Telehealth: Payer: Self-pay

## 2024-01-17 NOTE — Telephone Encounter (Signed)
 Copied from CRM #[id number]. Topic: General - Other >> Jan 17, 2024 10:06 AM Sophia H wrote: Reason for CRM:  [facility] - Calling for Dr. [name] Giaromo, direct cell # [phone]  Stated provider was expecting her call, decl to provide any other info.

## 2024-01-17 NOTE — Telephone Encounter (Signed)
 Wanted to get this back over to you incase Tiffany Gibbs had contacted you directly. Were you awaiting a call form [facility]?

## 2024-01-21 ENCOUNTER — Other Ambulatory Visit: Payer: Self-pay | Admitting: Internal Medicine

## 2024-01-25 ENCOUNTER — Telehealth: Payer: Self-pay

## 2024-01-25 NOTE — Telephone Encounter (Signed)
 Copied from CRM [id number]. Topic: Clinical - Prescription Issue >> Jan 25, 2024  2:24 PM Alfonso HERO wrote: Reason for CRM: patient says Corrie is stating in order for them to complete her refill the doctor has to send something stating used as directed for her Lancets (ONETOUCH ULTRASOFT) lancets. She is also asking for the doctor to return the call of her therapist and if theres any questions to please call her.

## 2024-01-25 NOTE — Telephone Encounter (Unsigned)
 Copied from CRM #[id number]. Topic: General - Other >> Jan 25, 2024  9:43 AM Willma SAUNDERS wrote: Reason for CRM: CRM was sent on 01/17/24 for original request of a call. Dr Giaromo has not received a call. Now the patient is calling requesting Dr Garald call Dr Arline directly.  [facility] - Calling for Dr. Garald Dr. Arline, direct cell # [phone]   Stated provider was expecting her call, decl to provide any other info.

## 2024-01-26 ENCOUNTER — Other Ambulatory Visit: Payer: Self-pay

## 2024-01-26 DIAGNOSIS — E119 Type 2 diabetes mellitus without complications: Secondary | ICD-10-CM

## 2024-01-26 MED ORDER — ONETOUCH ULTRASOFT LANCETS MISC: 12 refills | Status: AC

## 2024-01-26 NOTE — Telephone Encounter (Signed)
 Patient stated that prescription can stay use as directed ,it has to stateone prick in the morning and one prick at night something fo the sort in order for them to fill it for her.

## 2024-01-29 ENCOUNTER — Other Ambulatory Visit: Payer: Self-pay | Admitting: Internal Medicine

## 2024-01-31 ENCOUNTER — Telehealth: Payer: Self-pay

## 2024-01-31 ENCOUNTER — Other Ambulatory Visit: Payer: Self-pay | Admitting: Internal Medicine

## 2024-01-31 DIAGNOSIS — E119 Type 2 diabetes mellitus without complications: Secondary | ICD-10-CM

## 2024-01-31 MED ORDER — ONETOUCH VERIO VI STRP: ORAL_STRIP | 3 refills | Status: AC

## 2024-01-31 NOTE — Telephone Encounter (Signed)
 Okay to check CBG twice daily

## 2024-01-31 NOTE — Telephone Encounter (Signed)
 PCP has stated I called Dr Karenann -  left a VM Thx

## 2024-01-31 NOTE — Telephone Encounter (Signed)
 Copied from CRM [id number]. Topic: Clinical - Prescription Issue >> Jan 31, 2024 11:35 AM Laymon HERO wrote: Reason for CRM: [facility]- Insurance no longer covers [facility]- Needing to get a prescription for  Accu check monitor and supplies-

## 2024-01-31 NOTE — Telephone Encounter (Signed)
 I called Dr Karenann -  left a VM Thx

## 2024-02-01 MED ORDER — LANCETS MISC: 1.0000 | 0 refills | Status: AC

## 2024-02-01 MED ORDER — BLOOD GLUCOSE TEST VI STRP: 1.0000 | ORAL_STRIP | 0 refills | Status: AC

## 2024-02-01 MED ORDER — BLOOD GLUCOSE MONITORING SUPPL DEVI: 1.0000 | 0 refills | Status: AC

## 2024-02-01 MED ORDER — LANCET DEVICE MISC: 1.0000 | 0 refills | Status: AC

## 2024-02-01 MED ORDER — BLOOD GLUCOSE MONITORING SUPPL DEVI: 1.0000 | 0 refills | Status: DC

## 2024-02-01 NOTE — Addendum Note (Signed)
 Addended by: HEDDY IP R on: 02/01/2024 04:06 PM   Modules accepted: Orders

## 2024-02-01 NOTE — Addendum Note (Signed)
 Addended by: HEDDY IP R on: 02/01/2024 04:09 PM   Modules accepted: Orders

## 2024-02-02 ENCOUNTER — Telehealth: Payer: Self-pay

## 2024-02-02 NOTE — Telephone Encounter (Signed)
 Copied from CRM #[id number]. Topic: Clinical - Medication Question >> Feb 02, 2024 11:08 AM Wess RAMAN wrote: Reason for CRM: Dr, Karenann from [facility] would like Dr. Garald to prescribe Hydroxyzine  to patient for sleep and anxiety. She also stated patient has been doubling up on sleep meds without following prescription  Callback #: [phone] (cell)

## 2024-02-04 ENCOUNTER — Other Ambulatory Visit: Payer: Self-pay | Admitting: Internal Medicine

## 2024-02-04 MED ORDER — HYDROXYZINE HCL 50 MG PO TABS: 50.0000 mg | ORAL_TABLET | Freq: Every evening | ORAL | 2 refills | Status: AC | PRN

## 2024-02-04 NOTE — Telephone Encounter (Signed)
 Okay.  Noted.  Prescription sent.  Thanks

## 2024-02-07 ENCOUNTER — Telehealth: Payer: Self-pay

## 2024-02-07 ENCOUNTER — Ambulatory Visit: Admitting: Internal Medicine

## 2024-02-07 ENCOUNTER — Other Ambulatory Visit (HOSPITAL_COMMUNITY): Payer: Self-pay

## 2024-02-07 NOTE — Telephone Encounter (Signed)
 Pharmacy Patient Advocate Encounter   Received notification from [facility] that prior authorization for Hydroxyzine  is required/requested.   Insurance verification completed.   The patient is insured through [employer]. PA started elsewhere  already archived

## 2024-03-02 ENCOUNTER — Ambulatory Visit: Admitting: Internal Medicine
